# Patient Record
Sex: Male | Born: 1955 | Race: Black or African American | Hispanic: No | Smoking: Current every day smoker
Health system: Southern US, Community
[De-identification: ages and names within clinical notes are randomized; demographics above are authoritative.]

## PROBLEM LIST (undated history)

## (undated) DIAGNOSIS — I441 Atrioventricular block, second degree: Secondary | ICD-10-CM

## (undated) DIAGNOSIS — R55 Syncope and collapse: Secondary | ICD-10-CM

## (undated) DIAGNOSIS — Z950 Presence of cardiac pacemaker: Secondary | ICD-10-CM

## (undated) DIAGNOSIS — F0280 Dementia in other diseases classified elsewhere without behavioral disturbance: Secondary | ICD-10-CM

## (undated) DIAGNOSIS — Z21 Asymptomatic human immunodeficiency virus [HIV] infection status: Secondary | ICD-10-CM

## (undated) DIAGNOSIS — L568 Other specified acute skin changes due to ultraviolet radiation: Secondary | ICD-10-CM

## (undated) DIAGNOSIS — A523 Neurosyphilis, unspecified: Secondary | ICD-10-CM

## (undated) DIAGNOSIS — B20 Human immunodeficiency virus [HIV] disease: Secondary | ICD-10-CM

## (undated) DIAGNOSIS — I10 Essential (primary) hypertension: Secondary | ICD-10-CM

## (undated) HISTORY — DX: Neurosyphilis, unspecified: A52.3

## (undated) HISTORY — DX: Other specified acute skin changes due to ultraviolet radiation: L56.8

## (undated) HISTORY — DX: Asymptomatic human immunodeficiency virus (hiv) infection status: Z21

## (undated) HISTORY — DX: Essential (primary) hypertension: I10

## (undated) HISTORY — PX: PACEMAKER INSERTION: SHX728

## (undated) HISTORY — DX: Presence of cardiac pacemaker: Z95.0

## (undated) HISTORY — DX: Syncope and collapse: R55

## (undated) HISTORY — DX: Atrioventricular block, second degree: I44.1

## (undated) HISTORY — DX: Human immunodeficiency virus (HIV) disease: B20

## (undated) HISTORY — DX: Dementia in other diseases classified elsewhere without behavioral disturbance: F02.80

## (undated) SURGERY — COLONOSCOPY WITH PROPOFOL
Anesthesia: Monitor Anesthesia Care | Laterality: Left

## (undated) MED FILL — DAPSONE 100MG TABS: 100 MG | 30 days supply | Qty: 30 | Fill #0 | Status: AC

---

## 2009-12-12 ENCOUNTER — Emergency Department (HOSPITAL_COMMUNITY): Admission: EM | Admit: 2009-12-12 | Discharge: 2009-12-13 | Payer: Self-pay | Admitting: Emergency Medicine

## 2009-12-24 ENCOUNTER — Ambulatory Visit (HOSPITAL_COMMUNITY): Admission: RE | Admit: 2009-12-24 | Discharge: 2009-12-24 | Payer: Self-pay | Admitting: Internal Medicine

## 2010-02-06 ENCOUNTER — Ambulatory Visit (HOSPITAL_COMMUNITY)
Admission: RE | Admit: 2010-02-06 | Discharge: 2010-02-06 | Payer: Self-pay | Source: Home / Self Care | Attending: Internal Medicine | Admitting: Internal Medicine

## 2010-02-06 ENCOUNTER — Encounter (INDEPENDENT_AMBULATORY_CARE_PROVIDER_SITE_OTHER): Payer: Self-pay | Admitting: Internal Medicine

## 2010-03-05 ENCOUNTER — Ambulatory Visit
Admission: RE | Admit: 2010-03-05 | Discharge: 2010-03-05 | Payer: Self-pay | Source: Home / Self Care | Attending: Family Medicine | Admitting: Family Medicine

## 2010-03-05 ENCOUNTER — Encounter: Payer: Self-pay | Admitting: Family Medicine

## 2010-03-05 DIAGNOSIS — F1021 Alcohol dependence, in remission: Secondary | ICD-10-CM | POA: Insufficient documentation

## 2010-03-05 DIAGNOSIS — J449 Chronic obstructive pulmonary disease, unspecified: Secondary | ICD-10-CM | POA: Insufficient documentation

## 2010-03-05 DIAGNOSIS — L80 Vitiligo: Secondary | ICD-10-CM | POA: Insufficient documentation

## 2010-03-05 DIAGNOSIS — J4489 Other specified chronic obstructive pulmonary disease: Secondary | ICD-10-CM | POA: Insufficient documentation

## 2010-03-05 DIAGNOSIS — F17200 Nicotine dependence, unspecified, uncomplicated: Secondary | ICD-10-CM | POA: Insufficient documentation

## 2010-03-05 DIAGNOSIS — I441 Atrioventricular block, second degree: Secondary | ICD-10-CM | POA: Insufficient documentation

## 2010-03-10 ENCOUNTER — Encounter
Admission: RE | Admit: 2010-03-10 | Discharge: 2010-03-10 | Payer: Self-pay | Source: Home / Self Care | Attending: Cardiovascular Disease | Admitting: Cardiovascular Disease

## 2010-03-11 ENCOUNTER — Encounter: Payer: Self-pay | Admitting: Family Medicine

## 2010-03-11 LAB — CONVERTED CEMR LAB
ALT: 15 units/L
AST: 20 units/L
Albumin: 5.6 g/dL
Alkaline Phosphatase: 62 units/L
BUN: 14 mg/dL
Bilirubin Urine: NEGATIVE
CO2: 27 meq/L
Calcium: 9.7 mg/dL
Chloride: 97 meq/L
Creatinine, Ser: 1.05 mg/dL
Glucose, Bld: 97 mg/dL
HCT: 41.1 %
Hemoglobin: 13.6 g/dL
Ketones, ur: NEGATIVE mg/dL
Leukocytes, UA: NEGATIVE
MCHC: 33.1 g/dL
MCV: 92.6 fL
Nitrite: NEGATIVE
Platelets: 224 10*3/uL
Potassium: 4.3 meq/L
Protein, ur: NEGATIVE mg/dL
RBC: 4.44 M/uL
RDW: 11.7 %
Sodium: 133 meq/L
Specific Gravity, Urine: 1.011
TSH: 1.97 microintl units/mL
Total Bilirubin: 0.5 mg/dL
Total Protein: 8.6 g/dL
Urine Glucose: NEGATIVE mg/dL
Urobilinogen, UA: 0.2
WBC: 5.7 10*3/uL
pH: 6

## 2010-03-14 ENCOUNTER — Ambulatory Visit (HOSPITAL_COMMUNITY)
Admission: RE | Admit: 2010-03-14 | Discharge: 2010-03-15 | Payer: Self-pay | Source: Home / Self Care | Attending: Cardiovascular Disease | Admitting: Cardiovascular Disease

## 2010-03-17 LAB — GLUCOSE, CAPILLARY
Glucose-Capillary: 89 mg/dL (ref 70–99)
Glucose-Capillary: 306 mg/dL — ABNORMAL HIGH (ref 70–99)

## 2010-03-17 LAB — SURGICAL PCR SCREEN
MRSA, PCR: NEGATIVE
Staphylococcus aureus: POSITIVE — AB

## 2010-03-27 NOTE — Op Note (Addendum)
Shaun Lutz, [place]                  ACCOUNT NO.:  [id number]  MEDICAL RECORD NO.:  [id number]          PATIENT TYPE:  OIB  LOCATION:  2010                         FACILITY:  [facility]  PHYSICIAN:  Thurmon Fair, MD     DATE OF BIRTH:  05/11/1955  DATE OF PROCEDURE: DATE OF DISCHARGE:                              OPERATIVE REPORT   PROCEDURE PERFORMED: 1. Moderate sedation. 2. Fluoroscopy. 3. Implantation of new dual-chamber permanent pacemaker.  PREOPERATIVE DIAGNOSES: 1. Recurrent syncope due to high-grade second-degree atrioventricular     block. 2. Atrial tachycardia with necessary medications worsening     bradycardia.  PROCEDURE PERFORMED BY:  Thurmon Fair, MD  ASSISTANT:  Leanord Asal.  COMPLICATIONS:  None.  ESTIMATED BLOOD LOSS:  Less than 10 mL.  MEDICATIONS:  Ancef 1 g intravenously, lidocaine 1% 25 mL locally, Versed 2 mg and fentanyl 25 mcg intravenously.  DEVICE DETAILS: 1. Pacemaker generator, Medtronic Adapta, model #ADDDRL1, serial     [id number]. 2. Atrial lead, Medtronic 5076, 52 cm, serial #[id number]. 3. Ventricular lead, Medtronic 5076, 58 cm, serial #[id number].  After risks and benefits of the procedure were described, the patient provided informed consent.  He was brought to the cardiac cath lab in the fasting state and prepped and draped in the usual sterile fashion. Local anesthesia with 1% lidocaine was administered to the left infraclavicular area.  Using a blade, a 5-6 cm horizontal incision was made parallel to the inferior border of the left clavicle.  Using electrocautery and blunt dissection, a prepectoral pocket was created and carefully inspected for hemostasis.  An antibiotic-soaked sponge was placed in the pocket.  Under fluoroscopic guidance and using two separate venipunctures by the Seldinger technique, two separate J-tipped guidewires were inserted in the left subclavian vein.  These were subsequently exchanged for 7- [place]  SafeSheath.  Again under fluoroscopic guidance, a ventricular lead was advanced to level of the right ventricular apical septum.  The active fixation helix was deployed.  Excellent current of injury was noted.  There was no evidence of diaphragmatic/phrenic nerve stimulation at maximum device output.  Satisfactory sensing and pacing parameters were encountered. The sheath was removed and the lead was secured in place using 2-0 silk.  In a similar fashion, the atrial lead was advanced to the level of the right atrial appendage, and the active fixation helix was deployed. Prominent current of injury was seen.  Pacing at maximum device output did not produce any diaphragmatic/phrenic nerve stimulation.  Atrial sensing and pacing parameters were satisfactory.  The sheath was removed, and the lead was secured in place using a 2-0 silk.  The pocket was then flushed with copious amounts of antibiotic solution and reinspected for hemostasis which was found to be excellent.  The device was connected to the atrial and ventricular leads with careful attention taken to connect the leads in the correct fashion. Appropriate atrial and ventricular sequential pacing was noted.  The device was inserted in the pocket with great care being taken that the lead be located deep to the generator and that the device in general assume a comfortable position  with no tension on the edges of the wound.  The surgical site was then closed with two layers of 2-0 Vicryl and a layer of cutaneous staples.  A sterile dressing was applied.  No immediate complications occurred.  The following electronic parameters were encountered: 1. Atrial lead, sensed P-wave 4.6 mV, impedance 1013 ohms, threshold     1.3 V at 0.5 msec pulse width, current 1.4 mA. 2. Ventricular lead, sensed R-wave 4.2 mV, impedance 981 ohms,     threshold 0.4 V at 0.5 msec pulse width, current 0.5 mA.     Thurmon Fair, MD     MC/MEDQ  D:   03/14/2010  T:  03/15/2010  Job:  [id number]  cc:   Italy Hilty, MD  Electronically Signed by Thurmon Fair M.D. on 03/27/2010 12:17:39 PM

## 2010-04-03 NOTE — Miscellaneous (Signed)
Summary: ROI  ROI   Imported By: De Nurse 03/12/2010 15:39:50  _____________________________________________________________________  External Attachment:    Type:   Image     Comment:   External Document

## 2010-04-03 NOTE — Assessment & Plan Note (Signed)
Summary: np/pt will pay $20 then see deb hill/eo   Vital Signs:  Patient profile:   55 year old male Height:      67.75 inches Weight:      124.50 pounds BMI:     19.14 BSA:     1.67 Temp:     97.7 degrees F Pulse rate:   71 / minute BP sitting:   133 / 80  Vitals Entered By: Jone Baseman CMA (March 05, 2010 1:38 PM) CC: new pt Is Patient Diabetic? No Pain Assessment Patient in pain? no        CC:  new pt.  History of Present Illness: Pt was fainting at work in Oct.  Seen in ED and in afib.  Work up included NM scan with no ischemic defects, ECHO- essentially nml and cardiac enzymes.  Has seen cardiology and had blood work done, and is scheduled for a pacemaker insertion on 1/9.    Habits & Providers  Alcohol-Tobacco-Diet     Tobacco Status: current     Tobacco Counseling: to quit use of tobacco products     Cigarette Packs/Day: <0.25  Current Medications (verified): 1)  Aspirin 81 Mg Chew (Aspirin) .Marland Kitchen.. 1 By Mouth Daily  Allergies (verified): No Known Drug Allergies  Past History:  Family History: Last updated: 03/05/2010 Family History Breast cancer 1st degree relative <50 Family History of CAD Male 1st degree relative <50 Family History Diabetes 1st degree relative  Social History: Last updated: 03/05/2010 Alcohol use-no  Risk Factors: Smoking Status: current (03/05/2010) Packs/Day: <0.25 (03/05/2010)  Past Surgical History: Denies surgical history  Family History: Family History Breast cancer 1st degree relative <50 Family History of CAD Male 1st degree relative <50 Family History Diabetes 1st degree relative  Social History: Alcohol use-no Smoking Status:  current Packs/Day:  <0.25  Review of Systems       The patient complains of headaches.  The patient denies anorexia, chest pain, syncope, peripheral edema, prolonged cough, abdominal pain, hematochezia, and severe indigestion/heartburn.    Physical Exam  General:  alert,  well-developed, and well-nourished.   Head:  normocephalic and atraumatic.   Neck:  supple.   Lungs:  normal respiratory effort, no intercostal retractions, no accessory muscle use, and normal breath sounds.   Heart:  normal rate and irregular rhythm.   Abdomen:  soft and non-tender.   Extremities:  No clubbing, cyanosis, edema, or deformity noted with normal full range of motion of all joints.   Skin:  vitiligo.     Impression & Recommendations:  Problem # 1:  SICK SINUS SYNDROME (ICD-427.81)  His updated medication list for this problem includes:    Aspirin 81 Mg Chew (Aspirin) .Marland Kitchen... 1 by mouth daily  Problem # 2:  COPD (ICD-496) Found by CXR  Problem # 3:  TOBACCO ABUSE (ICD-305.1) Encouraged to quit.  Problem # 4:  VITILIGO (ICD-709.01)  Complete Medication List: 1)  Aspirin 81 Mg Chew (Aspirin) .Marland Kitchen.. 1 by mouth daily  Patient Instructions: 1)  Please schedule a follow-up appointment in 6 weeks.  Prescriptions: ASPIRIN 81 MG CHEW (ASPIRIN) 1 by mouth daily  #180 x 0   Entered and Authorized by:   Tinnie Gens MD   Signed by:   Tinnie Gens MD on 03/05/2010   Method used:   Historical   RxID:   [id number]    Orders Added: 1)  [facility] 4 [14782]

## 2010-04-07 ENCOUNTER — Other Ambulatory Visit: Payer: Self-pay | Admitting: Family Medicine

## 2010-04-07 ENCOUNTER — Ambulatory Visit (INDEPENDENT_AMBULATORY_CARE_PROVIDER_SITE_OTHER): Payer: Self-pay | Admitting: Family Medicine

## 2010-04-07 ENCOUNTER — Encounter: Payer: Self-pay | Admitting: Family Medicine

## 2010-04-07 DIAGNOSIS — R55 Syncope and collapse: Secondary | ICD-10-CM

## 2010-04-07 DIAGNOSIS — F17200 Nicotine dependence, unspecified, uncomplicated: Secondary | ICD-10-CM

## 2010-04-07 DIAGNOSIS — L80 Vitiligo: Secondary | ICD-10-CM

## 2010-04-07 DIAGNOSIS — J449 Chronic obstructive pulmonary disease, unspecified: Secondary | ICD-10-CM

## 2010-04-07 HISTORY — DX: Syncope and collapse: R55

## 2010-04-07 LAB — PSA: PSA: 0.87 ng/mL (ref <=4.00)

## 2010-04-07 LAB — CONVERTED CEMR LAB: PSA: 0.87 ng/mL (ref <=4.00)

## 2010-04-08 ENCOUNTER — Encounter: Payer: Self-pay | Admitting: Family Medicine

## 2010-04-17 NOTE — Miscellaneous (Signed)
Summary: ROI  ROI   Imported By: De Nurse 04/08/2010 11:30:27  _____________________________________________________________________  External Attachment:    Type:   Image     Comment:   External Document

## 2010-04-17 NOTE — Letter (Signed)
Summary: Results Follow-up Letter  All     ,     Phone:   Fax:     04/08/2010  Shaun Lutz [address] [place], [place]  [address]  [place]  Dear [name],   The following are the results of your recent test(s):  Your PSA, prostate test is completely normal!    Sincerely,   Tinnie Gens MD            Appended Document: Results Follow-up Letter mailed

## 2010-04-17 NOTE — Assessment & Plan Note (Signed)
Summary: f/u,df   Vital Signs:  Patient profile:   55 year old male Height:      67.75 inches Weight:      126 pounds BMI:     19.37 BSA:     1.68 Temp:     98.3 degrees F Pulse rate:   80 / minute BP sitting:   138 / 104  (left arm)  Vitals Entered By: Jone Baseman CMA (April 07, 2010 1:27 PM) CC: f/u Is Patient Diabetic? No Pain Assessment Patient in pain? no        CC:  f/u.  History of Present Illness: Pt. returns today.  He is s/p pacemaker insertion.  He is without complaint.  He is not back working.  He notes that he had some blood work done at Cards office---will try to get records-->Have records, does not have a. 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  Has pauses of up to 3 seconds.  Also, has runs of unsustained V tach.  Has not had lipid panel.  Needs some general medical things.  Habits & Providers  Alcohol-Tobacco-Diet     Tobacco Status: current     Tobacco Counseling: to quit use of tobacco products     Cigarette Packs/Day: <0.25  Current Medications (verified): 1)  Aspirin 81 Mg Chew (Aspirin) .Marland Kitchen.. 1 By Mouth Daily 2)  Atenolol 25 Mg Tab (Atenolol) .... Take One (1) Tablet By Mouth Daily  Allergies (verified): No Known Drug Allergies  Past History:  Past Surgical History: Last updated: 03/05/2010 Denies surgical history  Family History: Last updated: 03/05/2010 Family History Breast cancer 1st degree relative <50 Family History of CAD Male 1st degree relative <50 Family History Diabetes 1st degree relative  Social History: Last updated: 03/05/2010 Alcohol use-no  Risk Factors: Smoking Status: current (04/07/2010) Packs/Day: <0.25 (04/07/2010)  Review of Systems  The patient denies anorexia, fever, weight gain, decreased hearing, chest pain, syncope, dyspnea on exertion, peripheral edema, headaches, hemoptysis, and abdominal pain.    Physical Exam  General:  alert, well-developed, and well-nourished.   Head:   normocephalic and atraumatic.   Neck:  supple.   Lungs:  normal respiratory effort and normal breath sounds.   Heart:  normal rate and irregular rhythm.   Abdomen:  soft, non-tender, and normal bowel sounds.     Impression & Recommendations:  Problem # 1:  SYNCOPE (ICD-780.2)  Problem # 2:  ATRIOVENTRICULAR BLOCK, MOBITZ TYPE II (ICD-426.12)  Problem # 3:  TOBACCO ABUSE (ICD-305.1)  Orders: [facility]- Est Level  3 (04540)  Problem # 4:  VITILIGO (ICD-709.01)  Orders: [facility]- Est Level  3 (98119)  Complete Medication List: 1)  Aspirin 81 Mg Chew (Aspirin) .Marland Kitchen.. 1 by mouth daily 2)  Atenolol 25 Mg Tab (Atenolol) .... Take one (1) tablet by mouth daily  Other Orders: T-PSA Total ([address]) Flu Vaccine 78yrs + (13086) Admin 1st Vaccine (57846) Tdap => 82yrs IM (96295) Admin of Any Addtl Vaccine (28413)  Patient Instructions: 1)  Please schedule a follow-up appointment in 3 months .  Prescriptions: ATENOLOL 25 MG TAB (ATENOLOL) Take one (1) tablet by mouth daily  #30 x 3   Entered and Authorized by:   Tinnie Gens MD   Signed by:   Tinnie Gens MD on 04/07/2010   Method used:   Historical   RxID:   [id number]    Prevention & Chronic Care Immunizations   Influenza vaccine: Fluvax 3+  (04/07/2010)   Influenza vaccine due:  11/01/2011    Tetanus booster: 04/07/2010: Tdap    Pneumococcal vaccine: Not documented  Colorectal Screening   Hemoccult: Not documented    Colonoscopy: Not documented  Other Screening   PSA: Not documented   PSA ordered.   PSA action/deferral: Discussed-PSA requested  (04/07/2010)   Smoking status: current  (04/07/2010)  Lipids   Total Cholesterol: Not documented   LDL: Not documented   LDL Direct: Not documented   HDL: Not documented   Triglycerides: Not documented   Nursing Instructions: Give tetanus booster today    Orders Added: 1)  T-PSA Total [[address]] 2)  [facility]- Est Level  3 [29528] 3)  Flu Vaccine 68yrs + [90658] 4)   Admin 1st Vaccine [90471] 5)  Tdap => 42yrs IM [90715] 6)  Admin of Any Addtl Vaccine [41324]   Immunizations Administered:  Influenza Vaccine # 1:    Vaccine Type: Fluvax 3+    Site: left deltoid    Mfr: GlaxoSmithKline    Dose: 0.5 ml    Route: IM    Given by: Jone Baseman CMA    Exp. Date: 12/23/2011    Lot #: [id number]    VIS given: 09/24/09 version given April 07, 2010.  Tetanus Vaccine:    Vaccine Type: Tdap    Site: left deltoid    Mfr: GlaxoSmithKline    Dose: 0.5 ml    Route: IM    Given by: Jone Baseman CMA    Exp. Date: 12/20/2011    Lot #: [id number]    VIS given: 01/18/08 version given April 07, 2010.  Flu Vaccine Consent Questions:    Do you have a history of severe allergic reactions to this vaccine? no    Any prior history of allergic reactions to egg and/or gelatin? no    Do you have a sensitivity to the preservative Thimersol? no    Do you have a past history of Guillan-Barre Syndrome? no    Do you currently have an acute febrile illness? no    Have you ever had a severe reaction to latex? no    Vaccine information given and explained to patient? yes   Immunizations Administered:  Influenza Vaccine # 1:    Vaccine Type: Fluvax 3+    Site: left deltoid    Mfr: GlaxoSmithKline    Dose: 0.5 ml    Route: IM    Given by: Jone Baseman CMA    Exp. Date: 12/23/2011    Lot #: [id number]    VIS given: 09/24/09 version given April 07, 2010.  Tetanus Vaccine:    Vaccine Type: Tdap    Site: left deltoid    Mfr: GlaxoSmithKline    Dose: 0.5 ml    Route: IM    Given by: Jone Baseman CMA    Exp. Date: 12/20/2011    Lot #: [id number]    VIS given: 01/18/08 version given April 07, 2010.  ERROR: put in Flu immunization but actually gave Tdap. ............................................... Shanda Bumps [facility] April 07, 2010 3:41 PM

## 2010-04-29 ENCOUNTER — Emergency Department (HOSPITAL_COMMUNITY): Payer: Self-pay

## 2010-04-29 ENCOUNTER — Observation Stay (HOSPITAL_COMMUNITY)
Admission: EM | Admit: 2010-04-29 | Discharge: 2010-05-01 | Disposition: A | Discharge status: Home / Self Care | Payer: Self-pay | Attending: Cardiovascular Disease | Admitting: Cardiovascular Disease

## 2010-04-29 LAB — POCT I-STAT, CHEM 8
BUN: 23 mg/dL (ref 6–23)
Calcium, Ion: 1.15 mmol/L (ref 1.12–1.32)
Chloride: 99 mEq/L (ref 96–112)
Creatinine, Ser: 1.3 mg/dL (ref 0.4–1.5)
Glucose, Bld: 194 mg/dL — ABNORMAL HIGH (ref 70–99)
HCT: 44 % (ref 39.0–52.0)
Hemoglobin: 15 g/dL (ref 13.0–17.0)
Potassium: 4.6 mEq/L (ref 3.5–5.1)
Sodium: 136 mEq/L (ref 135–145)
TCO2: 26 mmol/L (ref 0–100)

## 2010-04-29 LAB — CBC
HCT: 39.7 % (ref 39.0–52.0)
Hemoglobin: 14 g/dL (ref 13.0–17.0)
MCH: 31.6 pg (ref 26.0–34.0)
MCHC: 35.3 g/dL (ref 30.0–36.0)
MCV: 89.6 fL (ref 78.0–100.0)
Platelets: 193 10*3/uL (ref 150–400)
RBC: 4.43 MIL/uL (ref 4.22–5.81)
RDW: 11.3 % — ABNORMAL LOW (ref 11.5–15.5)
WBC: 6 10*3/uL (ref 4.0–10.5)

## 2010-04-29 LAB — POCT CARDIAC MARKERS
CKMB, poc: 1.1 ng/mL (ref 1.0–8.0)
Myoglobin, poc: 107 ng/mL (ref 12–200)
Troponin i, poc: <0.05 ng/mL (ref 0.00–0.09)

## 2010-04-29 LAB — DIFFERENTIAL
Basophils Absolute: 0 10*3/uL (ref 0.0–0.1)
Basophils Relative: 1 % (ref 0–1)
Eosinophils Absolute: 0.7 10*3/uL (ref 0.0–0.7)
Eosinophils Relative: 12 % — ABNORMAL HIGH (ref 0–5)
Lymphocytes Relative: 27 % (ref 12–46)
Lymphs Abs: 1.6 10*3/uL (ref 0.7–4.0)
Monocytes Absolute: 0.4 10*3/uL (ref 0.1–1.0)
Monocytes Relative: 7 % (ref 3–12)
Neutro Abs: 3.2 10*3/uL (ref 1.7–7.7)
Neutrophils Relative %: 53 % (ref 43–77)

## 2010-04-30 LAB — BASIC METABOLIC PANEL
BUN: 18 mg/dL (ref 6–23)
CO2: 28 mEq/L (ref 19–32)
Calcium: 8.9 mg/dL (ref 8.4–10.5)
Chloride: 101 mEq/L (ref 96–112)
Creatinine, Ser: 1.04 mg/dL (ref 0.4–1.5)
GFR calc Af Amer: >60 mL/min (ref >60)
GFR calc non Af Amer: >60 mL/min (ref >60)
Glucose, Bld: 103 mg/dL — ABNORMAL HIGH (ref 70–99)
Potassium: 4 mEq/L (ref 3.5–5.1)
Sodium: 135 mEq/L (ref 135–145)

## 2010-04-30 LAB — HEMOGLOBIN A1C
Hgb A1c MFr Bld: 6.2 % — ABNORMAL HIGH (ref <5.7)
Mean Plasma Glucose: 131 mg/dL — ABNORMAL HIGH (ref <117)

## 2010-05-01 LAB — BASIC METABOLIC PANEL
BUN: 12 mg/dL (ref 6–23)
CO2: 28 mEq/L (ref 19–32)
Calcium: 8.5 mg/dL (ref 8.4–10.5)
Chloride: 103 mEq/L (ref 96–112)
Creatinine, Ser: 0.86 mg/dL (ref 0.4–1.5)
GFR calc Af Amer: >60 mL/min (ref >60)
GFR calc non Af Amer: >60 mL/min (ref >60)
Glucose, Bld: 106 mg/dL — ABNORMAL HIGH (ref 70–99)
Potassium: 3.8 mEq/L (ref 3.5–5.1)
Sodium: 135 mEq/L (ref 135–145)

## 2010-05-01 LAB — CBC
HCT: 34.4 % — ABNORMAL LOW (ref 39.0–52.0)
Hemoglobin: 11.7 g/dL — ABNORMAL LOW (ref 13.0–17.0)
MCH: 30.4 pg (ref 26.0–34.0)
MCHC: 34 g/dL (ref 30.0–36.0)
MCV: 89.4 fL (ref 78.0–100.0)
Platelets: 187 10*3/uL (ref 150–400)
RBC: 3.85 MIL/uL — ABNORMAL LOW (ref 4.22–5.81)
RDW: 11.1 % — ABNORMAL LOW (ref 11.5–15.5)
WBC: 6.2 10*3/uL (ref 4.0–10.5)

## 2010-05-15 LAB — COMPREHENSIVE METABOLIC PANEL
ALT: 12 U/L (ref 0–53)
AST: 17 U/L (ref 0–37)
Albumin: 3.7 g/dL (ref 3.5–5.2)
Alkaline Phosphatase: 56 U/L (ref 39–117)
BUN: 14 mg/dL (ref 6–23)
CO2: 26 mEq/L (ref 19–32)
Calcium: 9 mg/dL (ref 8.4–10.5)
Chloride: 102 mEq/L (ref 96–112)
Creatinine, Ser: 1.06 mg/dL (ref 0.4–1.5)
GFR calc Af Amer: >60 mL/min (ref >60)
GFR calc non Af Amer: >60 mL/min (ref >60)
Glucose, Bld: 115 mg/dL — ABNORMAL HIGH (ref 70–99)
Potassium: 4.5 mEq/L (ref 3.5–5.1)
Sodium: 135 mEq/L (ref 135–145)
Total Bilirubin: 0.4 mg/dL (ref 0.3–1.2)
Total Protein: 7.5 g/dL (ref 6.0–8.3)

## 2010-05-15 LAB — POCT CARDIAC MARKERS
CKMB, poc: <1 ng/mL — ABNORMAL LOW (ref 1.0–8.0)
Troponin i, poc: <0.05 ng/mL (ref 0.00–0.09)
Troponin i, poc: <0.05 ng/mL (ref 0.00–0.09)

## 2010-05-15 LAB — URINALYSIS, ROUTINE W REFLEX MICROSCOPIC
Bilirubin Urine: NEGATIVE
Glucose, UA: NEGATIVE mg/dL
Hgb urine dipstick: NEGATIVE
Ketones, ur: NEGATIVE mg/dL
Leukocytes, UA: NEGATIVE
Nitrite: POSITIVE — AB
Protein, ur: NEGATIVE mg/dL
Specific Gravity, Urine: 1.024 (ref 1.005–1.030)
Urobilinogen, UA: 1 mg/dL (ref 0.0–1.0)
pH: 6 (ref 5.0–8.0)

## 2010-05-15 LAB — URINE MICROSCOPIC-ADD ON

## 2010-05-15 LAB — CBC
HCT: 37.7 % — ABNORMAL LOW (ref 39.0–52.0)
Hemoglobin: 13 g/dL (ref 13.0–17.0)
MCH: 31 pg (ref 26.0–34.0)
MCHC: 34.5 g/dL (ref 30.0–36.0)
MCV: 89.8 fL (ref 78.0–100.0)
Platelets: 202 10*3/uL (ref 150–400)
RBC: 4.2 MIL/uL — ABNORMAL LOW (ref 4.22–5.81)
RDW: 11.4 % — ABNORMAL LOW (ref 11.5–15.5)
WBC: 7 10*3/uL (ref 4.0–10.5)

## 2010-05-15 LAB — URINE CULTURE
Colony Count: 50000
Culture  Setup Time: 201110140413

## 2010-05-15 LAB — DIFFERENTIAL
Basophils Absolute: 0 10*3/uL (ref 0.0–0.1)
Eosinophils Absolute: 0.2 10*3/uL (ref 0.0–0.7)
Eosinophils Relative: 3 % (ref 0–5)
Lymphs Abs: 0.8 10*3/uL (ref 0.7–4.0)
Monocytes Absolute: 0.5 10*3/uL (ref 0.1–1.0)

## 2010-05-22 NOTE — Discharge Summary (Signed)
  NAMEDERIN, GRANQUIST NO.:  [id number]  MEDICAL RECORD NO.:  [id number]           PATIENT TYPE:  I  LOCATION:  2037                         FACILITY:  [facility]  PHYSICIAN:  Nicki Guadalajara, M.D.     DATE OF BIRTH:  12-Nov-1955  DATE OF ADMISSION:  04/29/2010 DATE OF DISCHARGE:  05/01/2010                              DISCHARGE SUMMARY   DISCHARGE DIAGNOSES: 1. Syncope. 2. Ejection fraction 55-60%, normal wall motion, echocardiogram     February 06, 2010. 3. Tobacco use. 4. Vitiligo. 5. Status post pacemaker, January 2012.  HOSPITAL COURSE:  Mr. 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  He also has a history of tobacco use, vitiligo, normal ejection fraction on echo on February 06, 2010.  He presented with syncopal episode while at work at [employer] on April 29, 2010.  The patient stated he did not have any prodromal symptoms, he just kind of passed out. Current medications were atenolol 50 mg once daily and hydrochlorothiazide 12.5 mg daily.  He was admitted to check orthostatic with vitals.  His hydrochlorothiazide was discontinued, and his atenolol was changed to 25 mg b.i.d.  He was admitted for observation as well as hydration.  He has had no further episodes.  He has had no dizziness, no syncope and is ambulating fine.  Orthostatic blood pressures were normal.  He has been seen by Dr. Tresa Endo who feels he is stable for discharge with followup with Dr. Royann Shivers.  DISCHARGE LABORATORY DATA:  WBC 6.2, hemoglobin 11.7, hematocrit 34.4, platelets 187.  Sodium 135, potassium 3.8, chloride 103, carbon dioxide 28, glucose 106, BUN 12, calcium 8.5, hemoglobin A1c 6.2, mean plasma glucose 131.  CK-MB and troponins were negative.  STUDIES/PROCEDURES:  Chest x-ray stable bullous changes and scarring at the left lung base, no acute  cardiopulmonary disease.  DISCHARGE MEDICATIONS: 1. Atenolol 25 mg p.o. b.i.d. 2. Aspirin enteric coated 81 mg.  DISPOSITION:  Mr. Rajewski will be discharged home in stable condition.  He has had no further episodes of syncope or dizziness.  He is recommended to heart-healthy diet.  Follow up with Dr. Royann Shivers on Thursday, May 15, 2010, at 10 o'clock in the morning for further evaluation.    ______________________________ Wilburt Finlay, PA   ______________________________ Nicki Guadalajara, M.D.    BH/MEDQ  D:  05/01/2010  T:  05/02/2010  Job:  [id number]  cc:   Thurmon Fair, MD  Electronically Signed by Wilburt Finlay PA on 05/12/2010 05:02:54 PM Electronically Signed by Nicki Guadalajara M.D. on 05/22/2010 03:31:48 PM

## 2010-05-27 ENCOUNTER — Encounter: Payer: Self-pay | Admitting: Family Medicine

## 2010-05-27 ENCOUNTER — Ambulatory Visit (INDEPENDENT_AMBULATORY_CARE_PROVIDER_SITE_OTHER): Payer: Self-pay | Admitting: Family Medicine

## 2010-05-27 DIAGNOSIS — H538 Other visual disturbances: Secondary | ICD-10-CM

## 2010-05-27 DIAGNOSIS — I441 Atrioventricular block, second degree: Secondary | ICD-10-CM

## 2010-05-27 DIAGNOSIS — L80 Vitiligo: Secondary | ICD-10-CM

## 2010-05-27 DIAGNOSIS — I10 Essential (primary) hypertension: Secondary | ICD-10-CM | POA: Insufficient documentation

## 2010-05-27 MED ORDER — ATENOLOL 25 MG PO TABS: 50.0000 mg | ORAL_TABLET | Freq: Two times a day (BID) | ORAL | Status: DC

## 2010-05-27 MED ORDER — HYDROCORTISONE 1 % EX CREA: TOPICAL_CREAM | CUTANEOUS | Status: AC

## 2010-05-27 NOTE — Assessment & Plan Note (Signed)
Cream for hands

## 2010-05-27 NOTE — Assessment & Plan Note (Signed)
Will increase atenolol to 50 mg q am

## 2010-05-27 NOTE — Assessment & Plan Note (Signed)
Increase atenolol as pulse can handle.

## 2010-05-27 NOTE — Patient Instructions (Signed)
Second Degree Atrioventricular Block (2nd Degree Heart Block) Second degree atrioventricular block is a type of heart block. The heartbeat is a coordinated contraction between the upper and lower chambers of the heart. This coordinated contraction happens because of an electrical impulse that is sent from the upper chambers of the heart to the lower chambers of the heart. The electrical impulse causes the heart to beat and pump blood. Normally, this electrical impulse is transmitted without delay. In a second degree heart block, an interruption occurs in the heart's electrical impulse between the upper and lower chambers of the heart. When this happens, the heart does not beat in a timely manner and affects the amount of blood pumped by the heart.   There are two types of 2nd degree heart block:   Mobitz Type 1. In this type of 2nd degree heart block, the electrical impulse is gradually delayed more and more until the heart misses a beat. This type of 2nd degree heart block is less serious than Mobitz type 2.   Mobitz Type 2. This type of 2nd degree heart block is more serious and can become a more severe form of heart block. With Mobitz type 2, some of the electrical signals are blocked and do not reach the lower chambers of the heart. This can occur suddenly and without warning. Some people may need a permanent pacemaker with this type of heart block.  CAUSES The cause of 2nd degree heart block may be a result of:  Age. The heart's electrical system can degenerate due to the aging process.   Heart attack. A heart attack can cause scarring which can damage the heart's electrical system.   Open heart surgery can damage and scar areas of the heart which affect the heart' s electrical system.   Heart medication such as beta and calcium channel blockers. These kinds of medications can affect the electrical impulse of the heart and can slow the heart rate if the dosage is too high.  SYMPTOMS  Mobitz  type 1 - Generally no symptoms are noticed but may have symptoms listed under Mobitz type 2.   Mobitz type 2 - Compared to Mobitz type 1, greater likelihood of the following symptoms:   Fatigue.  Shortness of breath.   Dizziness or lightheadedness.   Fainting.  Chest pain.   DIAGNOSIS  Electrocardiogram (EKG). An EKG is a tracing of the heartbeat and can show a 2nd degree heart block.   Holter monitor. A holter monitor is a continuous heart rhythm recording for 24 hours. This can be helpful in determining the kind of heart block you have and how it can be treated.   Electrophysiology (EP) study. This is a procedure which tests the electrical pathway of your heart. This type of test is done by a specialist who places catheters (long thin tubes) in your heart. The catheters are used to study your heart and record your heart's electrical signals.  TREATMENT  Mobitz Type 1. Generally, no treatment is needed.   Mobitz Type 2. A permanent pacemaker may be needed.   Heart medications such as beta blockers or calcium channel blockers can slow the heart rate. Your caregiver may need to adjust your heart medication if this is the cause of your heart block. Adjusting your heart medication my reverse the heart block.  SEEK MEDICAL CARE IF:  You have unexplained fatigue.   You feel lightheaded.   You feel faint.   You feel your heart skipping beats or your  heart beats very fast.  SEEK IMMEDIATE MEDICAL CARE IF:  You have severe chest pain, especially if the pain is crushing or pressure-like and spreads to the arms, back, neck, or jaw. THIS IS AN EMERGENCY. Do not wait to see if the pain will go away. Get medical help at once. Call your local emergency services (911 in the [place] DO NOT drive yourself to the hospital.   You notice increasing shortness of breath during rest, sleeping, or with activity.   You "black out" or faint.  MAKE SURE YOU:   Understand these instructions.   Will  watch your condition.   Will get help right away if you are not doing well or get worse.  Document Released: 01/30/2008 Document Re-Released: 05/15/2008 [facility] Patient Information 2011 [place], [place].Hypertension (High Blood Pressure) As your heart beats, it forces blood through your arteries. This force is your blood pressure. If the pressure is too high, it is called hypertension (HTN) or high blood pressure. HTN is dangerous because you may have it and not know it. High blood pressure may mean that your heart has to work harder to pump blood. Your arteries may be narrow or stiff. The extra work puts you at risk for heart disease, stroke, and other problems.  Blood pressure consists of two numbers, a higher number over a lower, 110/72, for example. It is stated as "110 over 72." The ideal is below 120 for the top number (systolic) and under 80 for the bottom (diastolic). Write down your blood pressure today. You should pay close attention to your blood pressure if you have certain conditions such as:  Heart failure.  Prior heart attack.   Diabetes   Chronic kidney disease.   Prior stroke.   Multiple risk factors for heart disease.   To see if you have HTN, your blood pressure should be measured while you are seated with your arm held at the level of the heart. It should be measured at least twice. A one-time elevated blood pressure reading (especially in the Emergency Department) does not mean that you need treatment. There may be conditions in which the blood pressure is different between your right and left arms. It is important to see your caregiver soon for a recheck. Most people have essential hypertension which means that there is not a specific cause. This type of high blood pressure may be lowered by changing lifestyle factors such as:  Stress.  Smoking.   Lack of exercise.   Excessive weight.  Drug/tobacco/alcohol use.   Eating less salt.   Most people do not have  symptoms from high blood pressure until it has caused damage to the body. Effective treatment can often prevent, delay or reduce that damage. TREATMENT Treatment for high blood pressure, when a cause has been identified, is directed at the cause. There are a large number of medications to treat HTN. These fall into several categories, and your caregiver will help you select the medicines that are best for you. Medications may have side effects. You should review side effects with your caregiver. If your blood pressure stays high after you have made lifestyle changes or started on medicines,   Your medication(s) may need to be changed.   Other problems may need to be addressed.   Be certain you understand your prescriptions, and know how and when to take your medicine.   Be sure to follow up with your caregiver within the time frame advised (usually within two weeks) to have your  blood pressure rechecked and to review your medications.   If you are taking more than one medicine to lower your blood pressure, make sure you know how and at what times they should be taken. Taking two medicines at the same time can result in blood pressure that is too low.  SEEK IMMEDIATE MEDICAL CARE IF YOU DEVELOP:  A severe headache, blurred or changing vision, or confusion.   Unusual weakness or numbness, or a faint feeling.   Severe chest or abdominal pain, vomiting, or breathing problems.  MAKE SURE YOU:   Understand these instructions.   Will watch your condition.   Will get help right away if you are not doing well or get worse.  Document Released: 02/16/2005 Document Re-Released: 08/06/2009 [facility] Patient Information 2011 [place], [place].

## 2010-05-27 NOTE — Progress Notes (Signed)
  Subjective:    Patient ID: Shaun Lutz, male    DOB: December 20, 1955, 55 y.o.   MRN: [id number]  HPI Comments: Also needs to see eye doctor.  Has not had new rx since 4-5 years.  Eye glass wearer since age 41. Also complains of itching on hands.  Hypertension This is a recurrent problem. The current episode started more than 1 year ago. The problem has been gradually worsening since onset. Associated symptoms include anxiety and blurred vision. Pertinent negatives include no chest pain, peripheral edema or shortness of breath. There are no associated agents to hypertension. Risk factors for coronary artery disease include male gender and smoking/tobacco exposure.      Review of Systems  Constitutional: Negative for fever and activity change.  HENT: Negative for congestion and rhinorrhea.   Eyes: Positive for blurred vision.  Respiratory: Negative for cough and shortness of breath.   Cardiovascular: Negative for chest pain.       Objective:   Physical Exam  Constitutional: He appears well-developed and well-nourished.  HENT:  Head: Normocephalic and atraumatic.  Cardiovascular: Normal rate.   Pulmonary/Chest: Effort normal and breath sounds normal.  Abdominal: Soft.  Skin:       Vitiligo on hands          Assessment & Plan:

## 2010-06-26 ENCOUNTER — Encounter: Payer: Self-pay | Admitting: Family Medicine

## 2010-06-26 ENCOUNTER — Ambulatory Visit (INDEPENDENT_AMBULATORY_CARE_PROVIDER_SITE_OTHER): Payer: Self-pay | Admitting: Family Medicine

## 2010-06-26 VITALS — BP 120/80 | HR 82 | Temp 98.1°F | Wt 121.2 lb

## 2010-06-26 DIAGNOSIS — I10 Essential (primary) hypertension: Secondary | ICD-10-CM

## 2010-06-26 NOTE — Assessment & Plan Note (Signed)
BP is markedly improved.   Continue current regimine.

## 2010-06-26 NOTE — Progress Notes (Signed)
  Subjective:    Patient ID: Shaun Lutz, male    DOB: 01/18/1956, 55 y.o.   MRN: [id number]  Hypertension This is a chronic problem. The current episode started more than 1 month ago. The problem has been gradually improving since onset. The problem is controlled. Pertinent negatives include no anxiety, blurred vision, chest pain, headaches or malaise/fatigue. There are no associated agents to hypertension. Risk factors for coronary artery disease include male gender and smoking/tobacco exposure. Past treatments include beta blockers. The current treatment provides significant improvement. There are no compliance problems.       Review of Systems  Constitutional: Negative for malaise/fatigue, activity change, appetite change and fatigue.  Eyes: Negative for blurred vision.  Respiratory: Negative for apnea.   Cardiovascular: Negative for chest pain.  Genitourinary: Negative for frequency.  Neurological: Negative for headaches.       Objective:   Physical Exam  Constitutional: He appears well-developed and well-nourished.  HENT:  Head: Normocephalic and atraumatic.  Eyes: Pupils are equal, round, and reactive to light.  Neck: Normal range of motion.  Pulmonary/Chest: Effort normal and breath sounds normal.  Abdominal: Soft.          Assessment & Plan:

## 2012-04-09 ENCOUNTER — Emergency Department (HOSPITAL_COMMUNITY)
Admission: EM | Admit: 2012-04-09 | Discharge: 2012-04-09 | Disposition: A | Discharge status: Home / Self Care | Payer: Medicaid Other | Attending: Emergency Medicine | Admitting: Emergency Medicine

## 2012-04-09 ENCOUNTER — Other Ambulatory Visit: Payer: Self-pay

## 2012-04-09 DIAGNOSIS — Z8679 Personal history of other diseases of the circulatory system: Secondary | ICD-10-CM | POA: Insufficient documentation

## 2012-04-09 DIAGNOSIS — Z79899 Other long term (current) drug therapy: Secondary | ICD-10-CM | POA: Insufficient documentation

## 2012-04-09 DIAGNOSIS — I10 Essential (primary) hypertension: Secondary | ICD-10-CM | POA: Insufficient documentation

## 2012-04-09 DIAGNOSIS — F17200 Nicotine dependence, unspecified, uncomplicated: Secondary | ICD-10-CM | POA: Insufficient documentation

## 2012-04-09 DIAGNOSIS — Z950 Presence of cardiac pacemaker: Secondary | ICD-10-CM | POA: Insufficient documentation

## 2012-04-09 DIAGNOSIS — R55 Syncope and collapse: Secondary | ICD-10-CM

## 2012-04-09 DIAGNOSIS — Z7982 Long term (current) use of aspirin: Secondary | ICD-10-CM | POA: Insufficient documentation

## 2012-04-09 LAB — BASIC METABOLIC PANEL
CO2: 29 mEq/L (ref 19–32)
Chloride: 99 mEq/L (ref 96–112)
GFR calc Af Amer: >90 mL/min (ref >90)
Potassium: 5 mEq/L (ref 3.5–5.1)
Sodium: 133 mEq/L — ABNORMAL LOW (ref 135–145)

## 2012-04-09 LAB — CBC WITH DIFFERENTIAL/PLATELET
Basophils Absolute: 0.1 10*3/uL (ref 0.0–0.1)
Basophils Relative: 1 % (ref 0–1)
HCT: 37.2 % — ABNORMAL LOW (ref 39.0–52.0)
Lymphocytes Relative: 18 % (ref 12–46)
MCHC: 34.7 g/dL (ref 30.0–36.0)
Monocytes Absolute: 0.3 10*3/uL (ref 0.1–1.0)
Neutro Abs: 4.3 10*3/uL (ref 1.7–7.7)
Neutrophils Relative %: 67 % (ref 43–77)
RDW: 11.7 % (ref 11.5–15.5)
WBC: 6.4 10*3/uL (ref 4.0–10.5)

## 2012-04-09 LAB — POCT I-STAT TROPONIN I: Troponin i, poc: 0 ng/mL (ref 0.00–0.08)

## 2012-04-09 NOTE — ED Provider Notes (Signed)
History     CSN: [id number]  Arrival date & time 04/09/12  1839   None     Chief Complaint  Patient presents with  . Loss of Consciousness    (Consider location/radiation/quality/duration/timing/severity/associated sxs/prior treatment) HPI History provided by pt.   Pt had a syncopal episode while standing at work today.  Does not recall anything about the event, other than waking on the floor with several coworkers standing around him.  Has had "thousands" of syncopal episodes in the past.  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  Pt denies recent illnesses including fever, cough, CP, SOB, palpitations, N/V/D and dysuria.  Has been eating and drinking normally.    Past Medical History  Diagnosis Date  . Hypertension     Past Surgical History  Procedure Laterality Date  . Pacemaker insertion      Family History  Problem Relation Age of Onset  . Cancer Mother   . Heart disease Father   . Heart disease Sister   . Heart disease Brother     History  Substance Use Topics  . Smoking status: Current Every Day Smoker -- 0.25 packs/day for 15 years    Types: Cigarettes  . Smokeless tobacco: Not on file  . Alcohol Use: No      Review of Systems  All other systems reviewed and are negative.    Allergies  Review of patient's allergies indicates no known allergies.  Home Medications   Current Outpatient Rx  Name  Route  Sig  Dispense  Refill  . aspirin 81 MG tablet   Oral   Take 81 mg by mouth daily.           Marland Kitchen atenolol (TENORMIN) 50 MG tablet   Oral   Take 50 mg by mouth 2 (two) times daily.           BP 129/93  Pulse 70  Temp(Src) 97.7 F (36.5 C) (Oral)  Resp 26  SpO2 100%  Physical Exam  Nursing note and vitals reviewed. Constitutional: He is oriented to person, place, and time. He appears well-developed and well-nourished. No distress.   HENT:  Head: Normocephalic and atraumatic.  Mouth/Throat: Oropharynx is clear and moist.  Eyes:  Normal appearance  Neck: Normal range of motion.  Cardiovascular: Normal rate, regular rhythm and intact distal pulses.   Pulmonary/Chest: Effort normal and breath sounds normal.  Musculoskeletal: Normal range of motion.  No LE edema/ttp  Neurological: He is alert and oriented to person, place, and time. No sensory deficit. Coordination normal.  CN 3-12 intact.  No nystagmus. 5/5 and equal upper and lower extremity strength.  No past pointing.     Skin: Skin is warm and dry. No rash noted.  Psychiatric: He has a normal mood and affect. His behavior is normal.    ED Course  Procedures (including critical care time)    Date: 04/09/2012  Rate: 91  Rhythm: paced  QRS Axis: normal  Intervals: normal  ST/T Wave abnormalities: normal  Conduction Disutrbances:none  Narrative Interpretation:  LVH  Old EKG Reviewed: unchanged   Labs Reviewed  CBC WITH DIFFERENTIAL - Abnormal; Notable for the following:    RBC 4.15 (*)    Hemoglobin 12.9 (*)    HCT 37.2 (*)    Eosinophils Relative 10 (*)    All other components within normal limits  BASIC METABOLIC PANEL - Abnormal; Notable for  the following:    Sodium 133 (*)    All other components within normal limits  POCT I-STAT TROPONIN I   No results found.   1. Syncope       MDM  57yo M w/ h/o recurrent syncope, for which he has a pacemaker, presents w/ syncopal episode w/out prodrome this afternoon at work.  On exam, afebrile, non-toxic, well-hydrated, heart w/ RRR, lungs clear, no focal neuro deficits.  EKG unchanged from prior.  Labs pending.  Nursing staff to interrogate pacemaker.  8:52 PM   Nursing staff spoke with technician at [employer], and patient had no cardiac events today.  Labs are unremarkable, with exception of U/A which patient was unable to provide.  He has not had any urinary sx and no prior h/o UTI.  His vital signs  are stable, he looks well and is ready for discharge.  Advised f/u with his cardiologist.  Return precautions discussed. 11:25 PM         Otilio Miu, PA-C 04/09/12 2325

## 2012-04-09 NOTE — ED Notes (Signed)
Per EMS, pt was at work when he passed out. It was witnessed by coworkers. Pt stated that this has happened several times before. He stated that he gets really sleepy and then he passes out. No CP or SOB before or after LOC. He completely alert and oriented currently. He stated that he feels good at this point. Will continue to monitor. CBG 107

## 2012-04-09 NOTE — ED Notes (Signed)
[employer] pacemaker interrogated, awaiting report

## 2012-04-09 NOTE — ED Provider Notes (Signed)
Medical screening examination/treatment/procedure(s) were performed by non-physician practitioner and as supervising physician I was immediately available for consultation/collaboration.   Gwyneth Sprout, MD 04/09/12 [phone]

## 2012-04-20 ENCOUNTER — Encounter: Payer: Self-pay | Admitting: Family Medicine

## 2012-04-20 ENCOUNTER — Ambulatory Visit (INDEPENDENT_AMBULATORY_CARE_PROVIDER_SITE_OTHER): Payer: Self-pay | Admitting: Family Medicine

## 2012-04-20 VITALS — BP 127/91 | HR 95 | Ht 68.0 in | Wt 116.0 lb

## 2012-04-20 DIAGNOSIS — I441 Atrioventricular block, second degree: Secondary | ICD-10-CM

## 2012-04-20 DIAGNOSIS — R55 Syncope and collapse: Secondary | ICD-10-CM

## 2012-04-20 DIAGNOSIS — F17200 Nicotine dependence, unspecified, uncomplicated: Secondary | ICD-10-CM

## 2012-04-20 DIAGNOSIS — I10 Essential (primary) hypertension: Secondary | ICD-10-CM

## 2012-04-20 MED ORDER — ATENOLOL 50 MG PO TABS: 50.0000 mg | ORAL_TABLET | Freq: Two times a day (BID) | ORAL | Status: DC

## 2012-04-20 NOTE — Assessment & Plan Note (Signed)
Continues to be an issue.  May require him to begin disability-as he is no longer able to work.

## 2012-04-20 NOTE — Assessment & Plan Note (Signed)
Reports decreased down to 1/3 ppd.

## 2012-04-20 NOTE — Patient Instructions (Signed)
Dual-Chamber Pacemaker A pacemaker is a small, lightweight, battery-powered device that is implanted under the skin in the upper chest. Your caregiver may place a pacemaker if your heartbeat is too slow (bradycardia) or if you experience symptoms from a slow heartbeat. A dual-chamber pacemaker has 2 leads (electrodes) that are connected in your heart. One lead is placed in the upper chamber of the heart, called the right atrium. The second lead is placed in the lower part of the heart, called the right ventricle. Dual-chamber pacemakers may pace in both the upper chamber and lower chamber. By doing so, correct rhythm and function are often maintained. When the heart rate is too slow, the pacemaker senses the heartbeat and will pace the heart at a programmed rate. CAUSES  Different conditions can cause a slow heart rate. Some of these can include:  Sick sinus syndrome. This is a type of slow heart rate where the "pacemaker" of the heart does not work very well. It is often related to aging.  Heart attack (myocardial infarction). This can damage the heart muscle and cause a slow heart beat.  Heart block. This is a condition where the signal that causes the heart to beat does not communicate very well between the upper chambers of the heart and the lower chambers of the heart.  Some heart medications that control fast heart rates or other abnormal heart rhythms can also cause a slow heart rate. SYMPTOMS  A very slow heart rate results in the heart not pumping enough blood to your body. Symptoms of a slow heart rate can include:  Passing out (fainting).  Confusion.  Shortness of breath.  Tiredness (fatigue).  Ankle swelling.  Chest discomfort or pain. DIAGNOSIS  Tests will be done to look at how your heart works and beats. This can include:  A physical exam.  An electrocardiogram (ECG). An ECG records your heart beat on a strip of paper for your caregiver to look at.  Continuous ECG  monitoring:  Holter monitor or an Event monitor. These devices record your heart rhythm and can be worn for 24 or more hours at a time. Your caregiver can then look at the recorded history of your heartbeat.  An electrophysiology study. This is a test to study the heart's electrical system. If your heart has a disruption in its electrical pathway, a slow heart beat can occur. PACEMAKER IMPLANTATION  Do not eat or drink for 6 hours before the procedure or as told by your caregiver.  Pacemaker implantation usually takes about one hour.  Your skin on your upper chest will be cleaned with germ-killing soap.  Sedation will be given through an IV. This will help you relax during the procedure.  The site of the incision, often just below a collarbone, will be injected with numbing medicine.  The insulated electrode is inserted through a large vein in your chest. Then, using a special type of X-ray (fluoroscopy), the tip is positioned in the target area of your heart. The end of the pacemaker lead is fixated to your heart by a corkscrew tip or by small "tines" (soft anchor hooks).  The connection between the pacemaker electrode and the heart is checked to ensure optimal contact and placement.  After your pacemaker is implanted, you will need to stay in the hospital to make sure the pacemaker is working correctly. You will be able to go home when your caregiver feels it is safe for you to do so. HOME CARE INSTRUCTIONS  Excessive movement of the arm next to the new pacemaker can cause the electrodes to dislodge. Your caregiver will determine how many days the upper arm should not be moved excessively. It is usually three or more days.  The incision needs to be kept dry as told by your caregiver. As with any surgery, if the incision becomes swollen, red or pus (yellow or tan drainage) appears, call your caregiver right away.  Your caregiver may use small strips of tape hold the incision closed.  They should be allowed to fall off naturally. Do not pull the strips of tape off.  Digital cell phones should be kept 12 inches away from the pacemaker. Hold them at the ear on the side opposite of the pacer.  Never leave a cell phone in a pocket over the pacemaker.  Avoid strong electro-magnetic fields. You will not be able to have an MRI scan because of the strong magnets.  The pacemaker battery should last several years. The pacemaker needs to be checked at regular intervals as told by your caregiver. RISKS AND COMPLICATIONS An implanted pacemaker has risks. Some of these can include:  Infection.  The pacemaker electrode can become dislodged. If this should happen, a second surgery would be needed to reposition it.  During pacemaker implantation, it is possible to puncture the lung. This is a very rare occurrence. SEEK MEDICAL CARE IF:   You have dizziness or pass out.  You feel your heart "skipping" beats or feel your heart "racing."  Hiccups that do not go away.  You develop redness, swelling or pain at the pacemaker insertion site.  The pacemaker insertion site has yellow drainage or there is a bad odor coming from the insertion site.  An unexplained temperature of 102 F (38.9 C) or above develops. MAKE SURE YOU:   Understand these instructions.  Will watch your condition.  Will get help right away if you are not doing well or get worse. Document Released: 12/14/2008 Document Revised: 05/11/2011 Document Reviewed: 12/14/2008 [facility] Patient Information 2013 [place], [place]. Second Degree Atrioventricular Block Second degree atrioventricular block is a type of heart block. The heartbeat is a coordinated contraction between the upper and lower chambers of the heart. This coordinated contraction happens because of an electrical impulse that is sent from the upper chambers of the heart to the lower chambers of the heart. The electrical impulse causes the heart to beat and  pump blood. Normally, this electrical impulse is transmitted without delay. In a second degree heart block, an interruption occurs in the heart's electrical impulse between the upper and lower chambers of the heart. When this happens, the heart does not beat in a timely manner, which affects the amount of blood pumped by the heart. There are two types of 2nd degree heart block:   Mobitz Type 1. In this type of 2nd degree heart block, the electrical impulse is gradually delayed more and more until the heart misses a beat. This type of 2nd degree heart block is less serious than Mobitz type 2.  Mobitz Type 2. This type of 2nd degree heart block is more serious and can become a more severe form of heart block. With Mobitz type 2, some of the electrical signals are blocked and do not reach the lower chambers of the heart. This can occur suddenly and without warning. Some people may need a permanent pacemaker with this type of heart block. CAUSES  Second degree heart block may be a result of:  Age.  The heart's electrical system can degenerate due to the aging process.  Heart attack. A heart attack can cause scarring which can damage the heart's electrical system.  Open heart surgery can damage and scar areas of the heart which affect the heart' s electrical system.  Heart medications such as beta and calcium channel blockers. These kinds of medications can affect the electrical impulse of the heart and can slow the heart rate if the dosage is too high. SYMPTOMS   Mobitz type 1 - Usually, no symptoms are noticed, but a person may have the same symptoms listed under Mobitz type 2.  Mobitz type 2 - Compared to Mobitz type 1, there is a greater likelihood of experiencing the following symptoms:  Fatigue.  Shortness of breath.  Dizziness or lightheadedness.  Fainting.  Chest pain. DIAGNOSIS   Electrocardiogram (EKG). An EKG is a tracing of the heartbeat and can show a 2nd degree heart  block.  Holter monitor. A holter monitor is a continuous heart rhythm recording for 24 hours. This can be helpful in determining the kind of heart block you have and how it can be treated.  Electrophysiology (EP) study. This is a procedure that tests the electrical pathway of your heart. This type of test is done by a specialist who places long thin tubes (catheters) in your heart. The catheters are used to study your heart and record your heart's electrical signals. TREATMENT   Mobitz Type 1. Generally, no treatment is needed.  Mobitz Type 2. A permanent pacemaker may be needed.  Heart medications such as beta blockers or calcium channel blockers can slow the heart rate. Your caregiver may need to adjust your heart medication if this is the cause of your heart block. Adjusting your heart medication may reverse the heart block. SEEK MEDICAL CARE IF:   You have unexplained fatigue.  You feel lightheaded.  You feel faint.  You feel your heart skipping beats or your heart beats very fast. SEEK IMMEDIATE MEDICAL CARE IF:   You have severe chest pain, especially if the pain is crushing or pressure-like and spreads to the arms, back, neck, or jaw. This is an emergency. Do not wait to see if the pain will go away. Get medical help at once. Call your local emergency services (911 in the [place] Do not drive yourself to the hospital.  You notice increasing shortness of breath during rest, sleeping, or with activity.  You "black out" or faint. MAKE SURE YOU:   Understand these instructions.  Will watch your condition.  Will get help right away if you are not doing well or get worse. Document Released: 01/30/2008 Document Revised: 05/11/2011 Document Reviewed: 01/30/2008 [facility] Patient Information 2013 [place], [place].

## 2012-04-20 NOTE — Assessment & Plan Note (Signed)
BP looks good today, continue current regimen.

## 2012-04-20 NOTE — Progress Notes (Signed)
  Subjective:    Patient ID: Shaun Lutz, male    DOB: 05/12/1955, 57 y.o.   MRN: [id number]  Hypertension Pertinent negatives include no shortness of breath.    Here for the first time in > 1 year after syncopal episode on job.  He has since been let go of his job.  He was seen in the ED and they advised him to obtain f/u.  He has not seen cardiology for eval of pacemaker since insertion.  ED did call [employer] and he had no events detected by pace maker at time of syncope.  He has lost  Review of Systems  HENT: Negative for congestion.   Respiratory: Negative for shortness of breath and wheezing.   Cardiovascular: Negative for leg swelling.  Gastrointestinal: Negative for nausea, vomiting, abdominal pain and diarrhea.  Endocrine: Negative for polydipsia and polyuria.  Genitourinary: Negative for dysuria.       Objective:   Physical Exam  Vitals reviewed. Constitutional: He appears well-developed and well-nourished.  HENT:  Head: Normocephalic and atraumatic.  Eyes: No scleral icterus.  Neck: Neck supple.  Cardiovascular: Normal rate.   Pulmonary/Chest: Effort normal.  Abdominal: Soft.  Skin: Skin is warm.          Assessment & Plan:

## 2012-05-13 ENCOUNTER — Encounter: Payer: Self-pay | Admitting: Internal Medicine

## 2012-05-13 ENCOUNTER — Ambulatory Visit (INDEPENDENT_AMBULATORY_CARE_PROVIDER_SITE_OTHER): Payer: No Typology Code available for payment source | Admitting: Internal Medicine

## 2012-05-13 VITALS — BP 173/122 | HR 86 | Ht 68.0 in | Wt 119.0 lb

## 2012-05-13 DIAGNOSIS — R55 Syncope and collapse: Secondary | ICD-10-CM

## 2012-05-13 DIAGNOSIS — I441 Atrioventricular block, second degree: Secondary | ICD-10-CM

## 2012-05-13 DIAGNOSIS — Z950 Presence of cardiac pacemaker: Secondary | ICD-10-CM

## 2012-05-13 LAB — PACEMAKER DEVICE OBSERVATION
AL AMPLITUDE: 2 mv
AL THRESHOLD: 0.75 V
BAMS-0001: 175 {beats}/min
RV LEAD AMPLITUDE: 11.2 mv
RV LEAD IMPEDENCE PM: 544 Ohm
RV LEAD THRESHOLD: 0.75 V
VENTRICULAR PACING PM: 10

## 2012-05-13 NOTE — Patient Instructions (Addendum)
Your physician recommends that you schedule a follow-up appointment in: 3 MONTHS WITH DR [facility] CHECK

## 2012-05-13 NOTE — Assessment & Plan Note (Signed)
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

## 2012-05-13 NOTE — Assessment & Plan Note (Signed)
The patient's device was interrogated and the information was fully reviewed.  The device was reprogrammed to  As above 

## 2012-05-13 NOTE — Progress Notes (Signed)
Patient Care Team: Reva Bores, MD as PCP - General (Obstetrics and Gynecology)   HPI  Shaun Lutz is a 57 y.o. male Seen to establish pacemaker care. This was implanted 1/12 because of syncope and second degree heart block.it was done by Dr. Salena Saner.; however, he lost his insurance and was no longer able to be followed at [facility].cardiaccardiac evaluation at that time had demonstrated normal left ventricular function by echo.     He has had recurrent syncope as recently as 1 month ago  thse are stereotypical and assoc with warmth and diaphoresis and nausea;  They all occur while standing   Not in shower  Modest exercise intolerance but no edema Past Medical History  Diagnosis Date  . Hypertension     Past Surgical History  Procedure Laterality Date  . Pacemaker insertion      Current Outpatient Prescriptions  Medication Sig Dispense Refill  . aspirin 81 MG tablet Take 81 mg by mouth daily.        Marland Kitchen atenolol (TENORMIN) 50 MG tablet Take 1 tablet (50 mg total) by mouth 2 (two) times daily.  60 tablet  3   No current facility-administered medications for this visit.    No Known Allergies  Review of Systems negative except from HPI and PMH  Physical Exam BP 173/122  Pulse 86  Ht 5\' 8"  (1.727 m)  Wt 119 lb (53.978 kg)  BMI 18.1 kg/m2 Well developed and well nourished in no acute distress HENT normal E scleral and icterus clear Neck Supple JVP flat; carotids brisk and full Clear to ausculation *Regular rate and rhythm, no murmurs gallops or rub Soft with active bowel sounds No clubbing cyanosis no Edema Alert and oriented, grossly normal motor and sensory function Skin Warm and Dry vitiligo    Assessment and  Plan

## 2012-05-13 NOTE — Assessment & Plan Note (Signed)
As above.

## 2012-07-08 ENCOUNTER — Encounter: Payer: Self-pay | Admitting: Family Medicine

## 2012-07-08 ENCOUNTER — Ambulatory Visit (INDEPENDENT_AMBULATORY_CARE_PROVIDER_SITE_OTHER): Payer: No Typology Code available for payment source | Admitting: Family Medicine

## 2012-07-08 VITALS — BP 147/113 | HR 90 | Temp 97.9°F | Ht 68.0 in | Wt 118.0 lb

## 2012-07-08 DIAGNOSIS — I10 Essential (primary) hypertension: Secondary | ICD-10-CM

## 2012-07-08 DIAGNOSIS — G252 Other specified forms of tremor: Secondary | ICD-10-CM | POA: Insufficient documentation

## 2012-07-08 DIAGNOSIS — F17200 Nicotine dependence, unspecified, uncomplicated: Secondary | ICD-10-CM

## 2012-07-08 DIAGNOSIS — R259 Unspecified abnormal involuntary movements: Secondary | ICD-10-CM

## 2012-07-08 DIAGNOSIS — R251 Tremor, unspecified: Secondary | ICD-10-CM

## 2012-07-08 DIAGNOSIS — Z1211 Encounter for screening for malignant neoplasm of colon: Secondary | ICD-10-CM | POA: Insufficient documentation

## 2012-07-08 DIAGNOSIS — J449 Chronic obstructive pulmonary disease, unspecified: Secondary | ICD-10-CM

## 2012-07-08 DIAGNOSIS — Z7189 Other specified counseling: Secondary | ICD-10-CM | POA: Insufficient documentation

## 2012-07-08 DIAGNOSIS — R55 Syncope and collapse: Secondary | ICD-10-CM

## 2012-07-08 DIAGNOSIS — Z9181 History of falling: Secondary | ICD-10-CM

## 2012-07-08 DIAGNOSIS — F1021 Alcohol dependence, in remission: Secondary | ICD-10-CM

## 2012-07-08 LAB — LIPID PANEL
LDL Cholesterol: 91 mg/dL (ref 0–99)
Triglycerides: 81 mg/dL (ref <150)
VLDL: 16 mg/dL (ref 0–40)

## 2012-07-08 MED ORDER — LISINOPRIL 10 MG PO TABS: 10.0000 mg | ORAL_TABLET | Freq: Every day | ORAL | Status: DC

## 2012-07-08 NOTE — Assessment & Plan Note (Signed)
Check PFT's formally--may need meds.  Continue to work on smoking cessation.

## 2012-07-08 NOTE — Patient Instructions (Addendum)
Pulmonary Function Tests Your caregiver has scheduled you for pulmonary function testing. Pulmonary Function Tests (PFTs) are tests which help [place] know how your lungs are working. The lungs are the large organs in your chest on both sides of the heart which allow you to breathe. The main job of the lungs is ventilation. Ventilation is moving air in and out of the lungs. The air breathed in contains oxygen which allows you to live. When you breathe out, your lungs get rid of carbon dioxide, a waste product of breathing. The medical term for breathing in is inhalation. Breathing out is called exhalation. Some medical conditions interfere with breathing. This may be sudden and short lived as with pneumonia, or may be long standing such as with COPD (chronic obstructive pulmonary disease) that which may come as a result of years of smoking. CONDITIONS THAT INTERFERE WITH NORMAL BREATHING ARE CALLED RESTRICTIVE OR OBSTRUCTIVE  An obstructive condition occurs when air has difficulty flowing into the lungs due to resistance. This causes a decreased flow of air in the lungs. A restrictive condition occurs when the chest muscles are unable to expand adequately. This also causes a decreased flow of air in the lungs. USES OF PULMONARY FUNCTION TESTS Lung (pulmonary) function studies are used to find out causes of lung problems and what will be the best treatment. The "PFT" terms listed below refer to different procedures that measure lung function in different ways. Pulmonary function testing is quick, simple and safe for most people. There are many different reasons why PFTs may be ordered.   For healthy individuals as part of a routine physical examination  In industrial plants to follow how your lungs are working when exposed to chemicals over a long period of time  When an illness involving the lungs is suspected.  PFTs may be used to assess the lung function of patients prior to surgery or other procedures  in patients who have current lung and/or heart problems.  The test is also used for people who are smokers or who have other conditions that might be affected by surgery or other procedures. Some common measurements or values (terms) you may hear your caregiver use are:  Tidal volume (TV) - amount of air breathed in or out during normal breathing.  Minute volume (MV) - total amount of air breathed in and out per minute.  Vital capacity (VC) - total volume of air that can be breathed out after the largest breath in you can take.  Functional residual capacity (FRC) - amount of air remaining in lungs after normal breathing out.  Total lung capacity - total amount of air in the lungs with the largest breath you can take.  Forced vital capacity (FVC) - the amount of air forced out quickly after taking the largest possible breath.  Forced expiratory volume (FEV) - volume of air breathed out during the first, second, and third seconds of the FVC test.  Forced expiratory flow (FEF) - average rate of flow during the middle half of the FVC test.  Peak expiratory flow rate (PEFR) - maximum amount of air during forced breathing out. The values of these tests vary from person to person. Your values are compared to other people who have had the test and are similar to you in age, size, etc. They can also be compared to previous tests following treatment of lung disease. The tests can determine if lung function is getting better and if the treatments used are successful. COMPLICATIONS OF  TESTING MAY INCLUDE:  Light-headedness due to over breathing (hyperventilation).  An asthmatic attack from deep breathing. SOME REASONS FOR NOT DOING THE TEST INCLUDE:  Recent eye surgery, because of increased pressure inside the eyes during the procedure.  Recent abdominal or chest surgery, because of inability to take deep breaths.  Chest pain or heart problems.  Tuberculosis or respiratory infections, such as  pneumonia, a cold, or the flu. Discuss concerns with your caregiver before your procedure. PREPARATION FOR TEST   Avoid eating a large meal before your test.  Do not smoke before your test.  Take medications as ordered by your caregiver.  Wear comfortable clothing which will not interfere with breathing. If done as an outpatient, you should be present 60 minutes prior to your procedure or as directed.  DURING THE TEST  You will be given a soft nose clip to wear during the procedure so that all of your breaths will go through your mouth instead of your nose.  You will be given a sterile (germ-free) mouthpiece that will be attached to the spirometer. The spirometer is the machine that measures your breathing.  You will be instructed to perform various breathing maneuvers. The maneuvers will be done by inhaling (breathing in) and exhaling (breathing out). Depending on what measurements are ordered, you may be asked to repeat the maneuvers several times before the test is completed.  You may be given a bronchodilator after testing has been performed. A bronchodilator is a medication which makes the small air passages in your lungs larger. These medications usually make it easier to breathe. The tests are then repeated several minutes later after the bronchodilator has taken effect.  You will be monitored carefully during the procedure for faintness, dizziness, difficulty breathing, or any other problems. AFTER THE PROCEDURE   You may resume your usual diet, medications, and activities unless your physician advises you otherwise.  Your caregiver will go over your test results with you and determine what is causing your lung problems and what treatments may be helpful. Document Released: 10/10/2003 Document Revised: 05/11/2011 Document Reviewed: 02/15/2008 [facility] Patient Information 2013 [place], [place]. Hypertension As your heart beats, it forces blood through your arteries. This force is  your blood pressure. If the pressure is too high, it is called hypertension (HTN) or high blood pressure. HTN is dangerous because you may have it and not know it. High blood pressure may mean that your heart has to work harder to pump blood. Your arteries may be narrow or stiff. The extra work puts you at risk for heart disease, stroke, and other problems.  Blood pressure consists of two numbers, a higher number over a lower, 110/72, for example. It is stated as "110 over 72." The ideal is below 120 for the top number (systolic) and under 80 for the bottom (diastolic). Write down your blood pressure today. You should pay close attention to your blood pressure if you have certain conditions such as:  Heart failure.  Prior heart attack.  Diabetes  Chronic kidney disease.  Prior stroke.  Multiple risk factors for heart disease. To see if you have HTN, your blood pressure should be measured while you are seated with your arm held at the level of the heart. It should be measured at least twice. A one-time elevated blood pressure reading (especially in the Emergency Department) does not mean that you need treatment. There may be conditions in which the blood pressure is different between your right and left arms. It  is important to see your caregiver soon for a recheck. Most people have essential hypertension which means that there is not a specific cause. This type of high blood pressure may be lowered by changing lifestyle factors such as:  Stress.  Smoking.  Lack of exercise.  Excessive weight.  Drug/tobacco/alcohol use.  Eating less salt. Most people do not have symptoms from high blood pressure until it has caused damage to the body. Effective treatment can often prevent, delay or reduce that damage. TREATMENT  When a cause has been identified, treatment for high blood pressure is directed at the cause. There are a large number of medications to treat HTN. These fall into several  categories, and your caregiver will help you select the medicines that are best for you. Medications may have side effects. You should review side effects with your caregiver. If your blood pressure stays high after you have made lifestyle changes or started on medicines,   Your medication(s) may need to be changed.  Other problems may need to be addressed.  Be certain you understand your prescriptions, and know how and when to take your medicine.  Be sure to follow up with your caregiver within the time frame advised (usually within two weeks) to have your blood pressure rechecked and to review your medications.  If you are taking more than one medicine to lower your blood pressure, make sure you know how and at what times they should be taken. Taking two medicines at the same time can result in blood pressure that is too low. SEEK IMMEDIATE MEDICAL CARE IF:  You develop a severe headache, blurred or changing vision, or confusion.  You have unusual weakness or numbness, or a faint feeling.  You have severe chest or abdominal pain, vomiting, or breathing problems. MAKE SURE YOU:   Understand these instructions.  Will watch your condition.  Will get help right away if you are not doing well or get worse. Document Released: 02/16/2005 Document Revised: 05/11/2011 Document Reviewed: 10/07/2007 [facility] Patient Information 2013 [place], [place]. Smoking Cessation Quitting smoking is important to your health and has many advantages. However, it is not always easy to quit since nicotine is a very addictive drug. Often times, people try 3 times or more before being able to quit. This document explains the best ways for you to prepare to quit smoking. Quitting takes hard work and a lot of effort, but you can do it. ADVANTAGES OF QUITTING SMOKING  You will live longer, feel better, and live better.  Your body will feel the impact of quitting smoking almost immediately.  Within 20 minutes,  blood pressure decreases. Your pulse returns to its normal level.  After 8 hours, carbon monoxide levels in the blood return to normal. Your oxygen level increases.  After 24 hours, the chance of having a heart attack starts to decrease. Your breath, hair, and body stop smelling like smoke.  After 48 hours, damaged nerve endings begin to recover. Your sense of taste and smell improve.  After 72 hours, the body is virtually free of nicotine. Your bronchial tubes relax and breathing becomes easier.  After 2 to 12 weeks, lungs can hold more air. Exercise becomes easier and circulation improves.  The risk of having a heart attack, stroke, cancer, or lung disease is greatly reduced.  After 1 year, the risk of coronary heart disease is cut in half.  After 5 years, the risk of stroke falls to the same as a nonsmoker.  After 10  years, the risk of lung cancer is cut in half and the risk of other cancers decreases significantly.  After 15 years, the risk of coronary heart disease drops, usually to the level of a nonsmoker.  If you are pregnant, quitting smoking will improve your chances of having a healthy baby.  The people you live with, especially any children, will be healthier.  You will have extra money to spend on things other than cigarettes. QUESTIONS TO THINK ABOUT BEFORE ATTEMPTING TO QUIT You may want to talk about your answers with your caregiver.  Why do you want to quit?  If you tried to quit in the past, what helped and what did not?  What will be the most difficult situations for you after you quit? How will you plan to handle them?  Who can help you through the tough times? Your family? Friends? A caregiver?  What pleasures do you get from smoking? What ways can you still get pleasure if you quit? Here are some questions to ask your caregiver:  How can you help me to be successful at quitting?  What medicine do you think would be best for me and how should I take  it?  What should I do if I need more help?  What is smoking withdrawal like? How can I get information on withdrawal? GET READY  Set a quit date.  Change your environment by getting rid of all cigarettes, ashtrays, matches, and lighters in your home, car, or work. Do not let people smoke in your home.  Review your past attempts to quit. Think about what worked and what did not. GET SUPPORT AND ENCOURAGEMENT You have a better chance of being successful if you have help. You can get support in many ways.  Tell your family, friends, and co-workers that you are going to quit and need their support. Ask them not to smoke around you.  Get individual, group, or telephone counseling and support. Programs are available at [employer] and health centers. Call your local health department for information about programs in your area.  Spiritual beliefs and practices may help some smokers quit.  Download a "quit meter" on your computer to keep track of quit statistics, such as how long you have gone without smoking, cigarettes not smoked, and money saved.  Get a self-help book about quitting smoking and staying off of tobacco. LEARN NEW SKILLS AND BEHAVIORS  Distract yourself from urges to smoke. Talk to someone, go for a walk, or occupy your time with a task.  Change your normal routine. Take a different route to work. Drink tea instead of coffee. Eat breakfast in a different place.  Reduce your stress. Take a hot bath, exercise, or read a book.  Plan something enjoyable to do every day. Reward yourself for not smoking.  Explore interactive web-based programs that specialize in helping you quit. GET MEDICINE AND USE IT CORRECTLY Medicines can help you stop smoking and decrease the urge to smoke. Combining medicine with the above behavioral methods and support can greatly increase your chances of successfully quitting smoking.  Nicotine replacement therapy helps deliver nicotine to your  body without the negative effects and risks of smoking. Nicotine replacement therapy includes nicotine gum, lozenges, inhalers, nasal sprays, and skin patches. Some may be available over-the-counter and others require a prescription.  Antidepressant medicine helps people abstain from smoking, but how this works is unknown. This medicine is available by prescription.  Nicotinic receptor partial agonist medicine simulates the  effect of nicotine in your brain. This medicine is available by prescription. Ask your caregiver for advice about which medicines to use and how to use them based on your health history. Your caregiver will tell you what side effects to look out for if you choose to be on a medicine or therapy. Carefully read the information on the package. Do not use any other product containing nicotine while using a nicotine replacement product.  RELAPSE OR DIFFICULT SITUATIONS Most relapses occur within the first 3 months after quitting. Do not be discouraged if you start smoking again. Remember, most people try several times before finally quitting. You may have symptoms of withdrawal because your body is used to nicotine. You may crave cigarettes, be irritable, feel very hungry, cough often, get headaches, or have difficulty concentrating. The withdrawal symptoms are only temporary. They are strongest when you first quit, but they will go away within 10 14 days. To reduce the chances of relapse, try to:  Avoid drinking alcohol. Drinking lowers your chances of successfully quitting.  Reduce the amount of caffeine you consume. Once you quit smoking, the amount of caffeine in your body increases and can give you symptoms, such as a rapid heartbeat, sweating, and anxiety.  Avoid smokers because they can make you want to smoke.  Do not let weight gain distract you. Many smokers will gain weight when they quit, usually less than 10 pounds. Eat a healthy diet and stay active. You can always lose  the weight gained after you quit.  Find ways to improve your mood other than smoking. FOR MORE INFORMATION  www.smokefree.gov  Document Released: 02/10/2001 Document Revised: 08/18/2011 Document Reviewed: 05/28/2011 [facility] Patient Information 2013 [place], [place].

## 2012-07-08 NOTE — Assessment & Plan Note (Signed)
Not at goal--discussed with pt, risks, start Lisinopril.  F/u BP check here or with machine.

## 2012-07-08 NOTE — Progress Notes (Signed)
  Subjective:    Patient ID: Shaun Lutz, male    DOB: 1955-12-22, 57 y.o.   MRN: [id number]  HPI  Has not taken BP meds today, because not eaten. BP up when last saw cardiology also.  Needs colon cancer screen, PFT's Reports pneumovax with pace maker Work on smoking cessation, reports 1pack/week, exercising--reports SOB with dyspnea on exertion.  Review of Systems  Constitutional: Negative for chills and fatigue.  HENT: Negative for nosebleeds and congestion.   Respiratory: Positive for shortness of breath.   Cardiovascular: Negative for palpitations and leg swelling.  Gastrointestinal: Negative for nausea, vomiting, abdominal pain, diarrhea and anal bleeding.  Musculoskeletal: Negative for back pain.       Objective:   Physical Exam  Vitals reviewed. Constitutional: He appears well-developed and well-nourished.  HENT:  Head: Normocephalic and atraumatic.  Neck: Neck supple.  Cardiovascular: Normal rate and regular rhythm.   No murmur heard. Pulmonary/Chest: Effort normal and breath sounds normal. He has no wheezes.  Abdominal: Soft. There is no tenderness.  Lymphadenopathy:    He has no cervical adenopathy.          Assessment & Plan:

## 2012-07-08 NOTE — Assessment & Plan Note (Signed)
Smoking cessation discussed 

## 2012-07-08 NOTE — Assessment & Plan Note (Signed)
Improved since pacer adjusted by cardiology.

## 2012-07-14 ENCOUNTER — Ambulatory Visit: Payer: No Typology Code available for payment source | Admitting: Pharmacist

## 2012-07-15 ENCOUNTER — Encounter: Payer: Self-pay | Admitting: Pharmacist

## 2012-07-15 ENCOUNTER — Ambulatory Visit (INDEPENDENT_AMBULATORY_CARE_PROVIDER_SITE_OTHER): Payer: No Typology Code available for payment source | Admitting: Pharmacist

## 2012-07-15 VITALS — BP 149/79 | HR 76 | Ht 68.11 in | Wt 115.0 lb

## 2012-07-15 DIAGNOSIS — J449 Chronic obstructive pulmonary disease, unspecified: Secondary | ICD-10-CM

## 2012-07-15 DIAGNOSIS — F17200 Nicotine dependence, unspecified, uncomplicated: Secondary | ICD-10-CM

## 2012-07-15 NOTE — Assessment & Plan Note (Signed)
Nicotine replacement tx not initiated at this time. Pt plans on quitting after the 3 cigaretts in his pocket without any assistance. Will follow up with Dr. Shawnie Pons in 2-3 weeks. Return to Rx Clinic Visit as needed if unsuccessful with current quit attempt.   Total time in face-to-face counseling 45 minutes.  Patient seen with Franchot Erichsen , PharmD Resident and Richrd Humbles, PharmD candidate.

## 2012-07-15 NOTE — Progress Notes (Signed)
  Subjective:    Patient ID: Shaun Lutz, male    DOB: 1955/05/15, 57 y.o.   MRN: [id number]  HPI  Pt arrives and walks in with good spirits w/ partner [name] of 14 years. Appointment with RX clinic for spriometry test.   Age when started using tobacco on a daily basis: unknown, its been a long time. Number of Cigarettes per day: less than 1/2 pack, more than 1 or 2. Brand smoked [place]. Estimated Nicotine Content per Cigarette (mg) 1mg .  Estimated Nicotine intake per day 7-8mg .   Smokes first cigarette within 5 minutes after waking. Smokes times per night: 1 to 2. Estimated Fagerstrom Score ~5/10.  Most recent quit attempt: quit for 1 month, 2 years ago. Longest time ever been tobacco free 1 month. What Medications (NRT, bupropion, varenicline) used in past: none.  Triggers to use tobacco include; after meals, stress, bored, habit.    Review of Systems Pt endorses SOB on exertion, can only walk up 3 steps up stairs before he gets winded. Wakes in the middle of the time w/ coughing & SOB. Pt has pacemaker, put in ~2years ago.    Objective:   Physical Exam  mMRC score= 2  See Documentation Flowsheet (discrete results - PFTs) for complete Spirometry results. Patient provided good effort while attempting spirometry.   Lung Age = 89 years Albuterol Neb  Lot# [id number]     Exp. Nov '15  97-98 O2Sat on resting, decreased to 88-89 with walking in clinic. Lung exam: CTA per Dr. 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  Patient has been experiencing SOB on exertion for several years. Currently doesn't use any medications for management. no change of treatment plan at this time except for smoking cessation. May consider trial of Dulera samples in future.  Reevaluate O2Sat with exertion at follow up visit. Reviewed results of pulmonary function tests.  Pt verbalized understanding of results.  Written pt  instructions provided.    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    Nicotine replacement tx not initiated at this time. Pt plans on quitting after the 3 cigaretts in his pocket without any assistance. Will follow up with Dr. Shawnie Pons in 2-3 weeks. Return to Rx Clinic Visit as needed if unsuccessful with current quit attempt.   Total time in face-to-face counseling 45 minutes.  Patient seen with Franchot Erichsen , PharmD Resident and Richrd Humbles, PharmD candidate.

## 2012-07-15 NOTE — Progress Notes (Signed)
Patient ID: Shaun Lutz, male   DOB: 11-Jan-1956, 57 y.o.   MRN: [id number] Reviewed: Agree with Dr. Macky Lower documentation and management

## 2012-07-15 NOTE — Patient Instructions (Addendum)
Thank you for coming in today!  Please quit smoking.   Please follow up with Dr. Shawnie Pons in the next 2-3 weeks.

## 2012-07-15 NOTE — Assessment & Plan Note (Signed)
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  Patient has been experiencing SOB on exertion for several years. Currently doesn't use any medications for management. no change of treatment plan at this time except for smoking cessation. May consider trial of Dulera samples in future.  Reviewed results of pulmonary function tests.  Pt verbalized understanding of results.  Written pt instructions provided.    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    Nicotine replacement tx not initiated at this time. Pt plans on quitting after the 3 cigaretts in his pocket without any assistance. Will follow up with Dr. Shawnie Pons in 2-3 weeks. Return to Rx Clinic Visit as needed if unsuccessful with current quit attempt.   Total time in face-to-face counseling 45 minutes.  Patient seen with Franchot Erichsen , PharmD Resident and Richrd Humbles, PharmD candidate.

## 2012-08-10 ENCOUNTER — Encounter: Payer: Self-pay | Admitting: Family Medicine

## 2012-08-10 ENCOUNTER — Ambulatory Visit (INDEPENDENT_AMBULATORY_CARE_PROVIDER_SITE_OTHER): Payer: No Typology Code available for payment source | Admitting: Family Medicine

## 2012-08-10 VITALS — BP 91/61 | HR 91 | Temp 97.9°F | Ht 68.11 in | Wt 114.0 lb

## 2012-08-10 DIAGNOSIS — Z7189 Other specified counseling: Secondary | ICD-10-CM

## 2012-08-10 DIAGNOSIS — R5381 Other malaise: Secondary | ICD-10-CM

## 2012-08-10 DIAGNOSIS — R636 Underweight: Secondary | ICD-10-CM

## 2012-08-10 DIAGNOSIS — I10 Essential (primary) hypertension: Secondary | ICD-10-CM

## 2012-08-10 MED ORDER — LISINOPRIL 5 MG PO TABS: 5.0000 mg | ORAL_TABLET | Freq: Every day | ORAL | Status: DC

## 2012-08-10 NOTE — Assessment & Plan Note (Signed)
Too low BP today, have decreased his lisinopril to 5 mg daily.  May need further drop.  Could consider decreasing his atenolol also if ok with cards. May help with weakness.

## 2012-08-10 NOTE — Progress Notes (Signed)
  Subjective:    Patient ID: Shaun Lutz, male    DOB: [birth year], 57 y.o.   MRN: [id number]  Hypertension Associated symptoms include shortness of breath. Pertinent negatives include no chest pain.    Pt. Quit smoking x 2 wks without much difficulty.  PFT's done and reviewed.  Mild restrictive lung disease.  Just feeling weak.  Reports lying in bed much of day, because "I don't have anywhere to go".  This after quitting smoking. Started Lisinopril at last visit, and BP is down to 91/61 today. Taking Ensure, and weight is stable.  He will see cards soon.  Is on beta-blocker at 50.  Had h/o non-sustained Vtach.  No other tachycardias noted.    Review of Systems  Constitutional: Negative for appetite change and unexpected weight change.  Respiratory: Positive for shortness of breath. Negative for chest tightness.        Dyspnea on exertion.  Cardiovascular: Negative for chest pain.  Gastrointestinal: Negative for abdominal pain.  Musculoskeletal: Negative for arthralgias.  Skin: Positive for color change (vitiligo).       Objective:   Physical Exam  Vitals reviewed. Constitutional: No distress.  thin  HENT:  Head: Normocephalic and atraumatic.  Neck: Neck supple.  Cardiovascular: Normal rate and regular rhythm.   No murmur heard. Pulmonary/Chest: Effort normal and breath sounds normal.  Abdominal: Soft. There is no tenderness.  Lymphadenopathy:    He has cervical adenopathy.          Assessment & Plan:

## 2012-08-10 NOTE — Assessment & Plan Note (Signed)
Continued encouragement to continue with quitting smoking.

## 2012-08-10 NOTE — Assessment & Plan Note (Signed)
Advised to find a hobby, and increase activity slowly.

## 2012-08-10 NOTE — Assessment & Plan Note (Signed)
Advised to continue Ensure supplementation

## 2012-08-10 NOTE — Patient Instructions (Addendum)
Congratulations on quitting smoking!  That is the best thing you can do for your health.  You need to improve your exercise and strength.  That means getting up and moving.  Start small and increase as you can tolerate it.  Hypertension As your heart beats, it forces blood through your arteries. This force is your blood pressure. If the pressure is too high, it is called hypertension (HTN) or high blood pressure. HTN is dangerous because you may have it and not know it. High blood pressure may mean that your heart has to work harder to pump blood. Your arteries may be narrow or stiff. The extra work puts you at risk for heart disease, stroke, and other problems.  Blood pressure consists of two numbers, a higher number over a lower, 110/72, for example. It is stated as "110 over 72." The ideal is below 120 for the top number (systolic) and under 80 for the bottom (diastolic). Write down your blood pressure today. You should pay close attention to your blood pressure if you have certain conditions such as:  Heart failure.  Prior heart attack.  Diabetes  Chronic kidney disease.  Prior stroke.  Multiple risk factors for heart disease. To see if you have HTN, your blood pressure should be measured while you are seated with your arm held at the level of the heart. It should be measured at least twice. A one-time elevated blood pressure reading (especially in the Emergency Department) does not mean that you need treatment. There may be conditions in which the blood pressure is different between your right and left arms. It is important to see your caregiver soon for a recheck. Most people have essential hypertension which means that there is not a specific cause. This type of high blood pressure may be lowered by changing lifestyle factors such as:  Stress.  Smoking.  Lack of exercise.  Excessive weight.  Drug/tobacco/alcohol use.  Eating less salt. Most people do not have symptoms from high  blood pressure until it has caused damage to the body. Effective treatment can often prevent, delay or reduce that damage. TREATMENT  When a cause has been identified, treatment for high blood pressure is directed at the cause. There are a large number of medications to treat HTN. These fall into several categories, and your caregiver will help you select the medicines that are best for you. Medications may have side effects. You should review side effects with your caregiver. If your blood pressure stays high after you have made lifestyle changes or started on medicines,   Your medication(s) may need to be changed.  Other problems may need to be addressed.  Be certain you understand your prescriptions, and know how and when to take your medicine.  Be sure to follow up with your caregiver within the time frame advised (usually within two weeks) to have your blood pressure rechecked and to review your medications.  If you are taking more than one medicine to lower your blood pressure, make sure you know how and at what times they should be taken. Taking two medicines at the same time can result in blood pressure that is too low. SEEK IMMEDIATE MEDICAL CARE IF:  You develop a severe headache, blurred or changing vision, or confusion.  You have unusual weakness or numbness, or a faint feeling.  You have severe chest or abdominal pain, vomiting, or breathing problems. MAKE SURE YOU:   Understand these instructions.  Will watch your condition.  Will get  help right away if you are not doing well or get worse. Document Released: 02/16/2005 Document Revised: 05/11/2011 Document Reviewed: 10/07/2007 [facility] Patient Information 2014 [place].

## 2012-08-22 ENCOUNTER — Ambulatory Visit (INDEPENDENT_AMBULATORY_CARE_PROVIDER_SITE_OTHER): Payer: No Typology Code available for payment source | Admitting: Internal Medicine

## 2012-08-22 ENCOUNTER — Encounter: Payer: Self-pay | Admitting: Internal Medicine

## 2012-08-22 VITALS — BP 91/68 | HR 66 | Ht 68.0 in | Wt 112.0 lb

## 2012-08-22 DIAGNOSIS — I959 Hypotension, unspecified: Secondary | ICD-10-CM | POA: Insufficient documentation

## 2012-08-22 DIAGNOSIS — Z950 Presence of cardiac pacemaker: Secondary | ICD-10-CM

## 2012-08-22 DIAGNOSIS — I441 Atrioventricular block, second degree: Secondary | ICD-10-CM

## 2012-08-22 DIAGNOSIS — I10 Essential (primary) hypertension: Secondary | ICD-10-CM

## 2012-08-22 LAB — PACEMAKER DEVICE OBSERVATION
AL AMPLITUDE: 5.6 mv
AL THRESHOLD: 0.5 V
BAMS-0001: 175 {beats}/min
BATTERY VOLTAGE: 2.79 V
RV LEAD AMPLITUDE: 15.68 mv

## 2012-08-22 NOTE — Assessment & Plan Note (Signed)
Stable post pacing 

## 2012-08-22 NOTE — Progress Notes (Signed)
Patient Care Team: Reva Bores, MD as PCP - General (Obstetrics and Gynecology)   HPI  Shaun Lutz is a 57 y.o. male Seen In followup for a pacemaker implanted 1/12 because of syncope and second degree heart block.it was done by Dr. Salena Saner.; however, he lost his insurance and was no longer able to be followed at [facility]    Cardiac evaluation at that time had demonstrated normal left ventricular function by echo.    he has had no recurrent syncope. He has stopped smoking.  At the last visit we also activated reversed once in his exercise tolerance is much improved; he denies chest pain shortness of breath or edema  Past Medical History  Diagnosis Date  . Hypertension   . Second degree AV block   . Syncope   . Pacemaker -[employer]     DOI 2012    Past Surgical History  Procedure Laterality Date  . Pacemaker insertion      Current Outpatient Prescriptions  Medication Sig Dispense Refill  . aspirin 81 MG tablet Take 81 mg by mouth daily.        Marland Kitchen atenolol (TENORMIN) 50 MG tablet Take 1 tablet (50 mg total) by mouth 2 (two) times daily.  60 tablet  3  . lisinopril (PRINIVIL,ZESTRIL) 5 MG tablet Take 1 tablet (5 mg total) by mouth daily.  90 tablet  3   No current facility-administered medications for this visit.    No Known Allergies  Review of Systems negative except from HPI and PMH  Physical Exam BP 91/68  Pulse 66  Ht 5\' 8"  (1.727 m)  Wt 112 lb (50.803 kg)  BMI 17.03 kg/m2 Well developed and nourished in no acute distress HENT normal Neck supple with JVP-flat Clear Regular rate and rhythm, no murmurs or gallops Abd-soft with active BS No Clubbing cyanosis edema Skin-warm and dry A & Oriented  Grossly normal sensory and motor function     Assessment and  Plan

## 2012-08-22 NOTE — Assessment & Plan Note (Signed)
Will decrease atenolol

## 2012-08-22 NOTE — Assessment & Plan Note (Signed)
The patient's device was interrogated.  The information was reviewed. No changes were made in the programming.   Will initiate remote followup at next visit

## 2012-08-22 NOTE — Patient Instructions (Addendum)
Your physician has recommended you make the following change in your medication:  1) Decrease atenolol to 50 mg one tablet by mouth once daily  Your physician wants you to follow-up in: 6 months with Dr. Graciela Husbands. You will receive a reminder letter in the mail two months in advance. If you don't receive a letter, please call our office to schedule the follow-up appointment.

## 2012-10-25 ENCOUNTER — Telehealth: Payer: Self-pay | Admitting: Family Medicine

## 2012-10-25 DIAGNOSIS — I10 Essential (primary) hypertension: Secondary | ICD-10-CM

## 2012-10-25 MED ORDER — ATENOLOL 50 MG PO TABS: ORAL_TABLET | ORAL | Status: DC

## 2012-10-25 NOTE — Telephone Encounter (Signed)
Will forward to MD. Jazmin Hartsell,CMA  

## 2012-10-25 NOTE — Telephone Encounter (Signed)
Patient is completely out of his atenolol and needs a refill. Patient states pharmacy sent request yesterday w/ no response.

## 2012-11-23 ENCOUNTER — Encounter: Payer: Self-pay | Admitting: Family Medicine

## 2012-11-23 ENCOUNTER — Ambulatory Visit (INDEPENDENT_AMBULATORY_CARE_PROVIDER_SITE_OTHER): Payer: Self-pay | Admitting: Family Medicine

## 2012-11-23 VITALS — BP 118/87 | HR 89 | Temp 97.7°F | Ht 68.0 in | Wt 116.0 lb

## 2012-11-23 DIAGNOSIS — I959 Hypotension, unspecified: Secondary | ICD-10-CM

## 2012-11-23 DIAGNOSIS — H9190 Unspecified hearing loss, unspecified ear: Secondary | ICD-10-CM

## 2012-11-23 DIAGNOSIS — H9193 Unspecified hearing loss, bilateral: Secondary | ICD-10-CM

## 2012-11-23 DIAGNOSIS — H6122 Impacted cerumen, left ear: Secondary | ICD-10-CM

## 2012-11-23 DIAGNOSIS — H6123 Impacted cerumen, bilateral: Secondary | ICD-10-CM

## 2012-11-23 DIAGNOSIS — H6120 Impacted cerumen, unspecified ear: Secondary | ICD-10-CM | POA: Insufficient documentation

## 2012-11-23 DIAGNOSIS — R636 Underweight: Secondary | ICD-10-CM

## 2012-11-23 DIAGNOSIS — R5381 Other malaise: Secondary | ICD-10-CM

## 2012-11-23 NOTE — Assessment & Plan Note (Signed)
Improved since decreasing his medication.

## 2012-11-23 NOTE — Assessment & Plan Note (Signed)
S/p irrigation and removal.

## 2012-11-23 NOTE — Patient Instructions (Addendum)
Presbycusis Age-related hearing loss (presbycusis) affects nearly one third of the elderly. It generally starts around middle age and is more common in men. The changes causing this take place in the cochlea, a cavity in the middle ear. This contains many tiny hairs that convert sound vibrations into electrical impulses, which are interpreted by your brain. As we grow older this type of hearing loss is termed sensoryneural hearing loss. It is permanent and cannot be corrected surgically. People with this type of hearing loss will probably need hearing aids. HOME CARE INSTRUCTIONS   Learning to read lips will be helpful.  Face the person speaking to you to assist with lip reading.  Watch cues such as hand gestures and facial expressions of person talking to you.  Decrease, or if possible, avoid background noise.  Ask your caregiver if hearing aids are appropriate to help with your type of hearing loss.  Local civic and community groups may have means for financial help in obtaining hearing aids if they aren't covered by your health insurance.  If you have hearing aids, do not wear them in the shower or while swimming.  Keep hearing aids dry, clean, and away from chemicals, including hair care products. Document Released: 02/14/2000 Document Revised: 05/11/2011 Document Reviewed: 02/16/2005 [facility] Patient Information 2014 [place], [place]. Pacemaker Battery Change A pacemaker battery usually lasts 4 to 12 years. Once or twice per year, you will be asked to visit your caregiver to have a full evaluation of your pacemaker. When a battery needs to be replaced, the entire pacemaker is actually replaced so that you can benefit from new circuitry and any new features that have recently been added to pacemakers. Most often, this procedure is very simple because the leads are already in place. After giving medicine to numb the skin, your health care provider makes a cut to reopen the pocket holding the  pacemaker and disconnects the old device from its leads. The leads are routinely tested at this time. If they are working okay, the new pacemaker may simply be connected to the existing leads. If there is any problem with the old lead system, it may be wise to replace the lead system while inserting the new pacemaker. There are many things that affect how long a pacemaker battery will last:  Age of the pacemaker.  Number of leads (1, 2 or 3).  Pacemaker work load. If the pacemaker is helping the heart more often, then the battery will not last as long as if the pacemaker does not need to help the heart.  Resistance of the leads. The greater the resistance, the greater the drain on the battery. This can increase as the leads get older or if one or more of the leads does not have the best contact with the heart.  Power (voltage) settings.  The health of the person's heart. If the health of the heart gets worse, then the pacemaker may have to work more often and the setting changed to accommodate these changes. Your health care provider will be alerted to the fact that it is time to replace the battery during follow-up exams. He or she will check your pacemaker using a small table-top computer, called a programmer, and a wand. The wand is about the same size as a remote control. Your provider puts the wand on your body in the area where the pacemaker is located. Information from the pacemaker is received about how well your heart is working and the status of the  battery. It is not painful, and it usually takes just a few minutes. You will have plenty of time before the battery is fully used up to plan for replacement.  LET YOUR CAREGIVER KNOW ABOUT:   Symptoms of chest pain, trouble breathing, palpitations, lightheadedness, or feelings of an abnormal or irregular heart beat.  Allergies.  Medications taken including herbs, eye drops, over the counter medications, and creams  Use of steroids (by  mouth or creams).  Possible pregnancy, if applicable.  Previous problems with anesthetics or Novocaine.  History of blood clots (thrombophlebitis).  History of bleeding or blood problems.  Surgery since your last pacemaker placement.  Other health problems. RISKS AND COMPLICATIONS These are very uncommon but include:  Bleeding.  Bruising of the skin around where the incision was made.  Pain at the site of the incision.  Pulling apart of the skin at the incision site.  Infection.  Allergic reaction to anesthetics or medicines used during the procedure. Diabetics may have a temporary increase in their blood sugar after any surgical procedure.  BEFORE THE PROCEDURE  Wash all of the skin around the area of the chest where the pacemaker is located. Try to remove any loose, scaling skin. Unless advised otherwise, avoid using aspirin, ibuprofen, or naproxen for 3-4 days before the procedure. Ask your caregiver for help with any other medication adjustments before the pacemaker is replaced. Unless advised otherwise, do not eat or drink after midnight on the night before the procedure EXCEPT for drinking water and taking your medications as you normally would. AFTER THE PROCEDURE   A heart monitor and the pacemaker programmer will be used to make sure that the new pacemaker is working properly.  You can go home after the procedure.  Your caregiver will advise you if you need to have any stitches. They will be removed 5-7 days after the procedure. HOME CARE INSTRUCTIONS   Keep the incision clean and dry.  Unless advised otherwise, you may shower beginning 48 hours after your procedure.  For the first week after the replacement, avoid stretching motions that pull at the incision site and avoid heavy exercise with the arm on the same side as the incision.  Only take over-the-counter or prescription medicines for pain, discomfort, or fever as directed by your caregiver.  Your  caregiver will tell you when you will need to next test your pacemaker by telephone or when to return to the office for re-exam and/or removal of stitches, if necessary. SEEK MEDICAL CARE IF:   You have unusual pain at the incision site that is not adequately helped by over-the-counter or prescription medicine.  There is drainage or pus from the incision site.  You develop red streaking that extends above or below the incision site.  You feel brief intermittent palpitations, lightheadedness or any symptoms that you feel might be related to your heart. SEEK IMMEDIATE MEDICAL CARE IF:   You experience chest pain that is different than the pain at the incision site.  You experience:  Shortness of breath.  Palpitations.  Irregular heart beat.  Lightheadedness that does not go away quickly.  Fainting.  You develop a fever.  You have pain that gets worse even though you are taking pain medicine. MAKE SURE YOU:   Understand these instructions.  Will watch your condition.  Will get help right away if you are not doing well or get worse. Document Released: 05/27/2006 Document Revised: 11/11/2011 Document Reviewed: 08/30/2006 [facility] Patient Information 2014 [place], [place].  Cerumen Impaction A cerumen impaction is when the wax in your ear forms a plug. This plug usually causes reduced hearing. Sometimes it also causes an earache or dizziness. Removing a cerumen impaction can be difficult and painful. The wax sticks to the ear canal. The canal is sensitive and bleeds easily. If you try to remove a heavy wax buildup with a cotton tipped swab, you may push it in further. Irrigation with water, suction, and small ear curettes may be used to clear out the wax. If the impaction is fixed to the skin in the ear canal, ear drops may be needed for a few days to loosen the wax. People who build up a lot of wax frequently can use ear wax removal products available in your local drugstore. SEEK  MEDICAL CARE IF:  You develop an earache, increased hearing loss, or marked dizziness. Document Released: 03/26/2004 Document Revised: 05/11/2011 Document Reviewed: 05/16/2009 [facility] Patient Information 2014 [place], [place].

## 2012-11-23 NOTE — Progress Notes (Signed)
  Subjective:    Patient ID: Shaun Lutz, male    DOB: 24-Nov-1955, 57 y.o.   MRN: [id number]  Hypertension Pertinent negatives include no shortness of breath.   Here today for f/u.  Has seen cardiology and they checked his pace maker.  It is ok.  They also decreased his metoprolol.  I had previously decreased his lisinopril, as his BP was very low and he was complaining of decreased energy.  BP is up. Wife is complaining of poor hearing.  He states it has been a problem for a while. He is taking Ensure and his weight is up 4 pounds.   Review of Systems  Constitutional: Negative for fever and chills.  HENT: Positive for hearing loss.   Eyes: Negative for visual disturbance.  Respiratory: Negative for cough and shortness of breath.   Cardiovascular: Negative for leg swelling.  Gastrointestinal: Negative for abdominal pain.       Objective:   Physical Exam  Vitals reviewed. Constitutional: He appears well-developed and well-nourished.  HENT:  Cerumen impaction on left--Unable to see either drum well.  Neck: Neck supple.  Cardiovascular: Normal rate and regular rhythm.   No murmur heard. Pulmonary/Chest: Effort normal. No respiratory distress.  Abdominal: There is no tenderness.  Musculoskeletal: He exhibits no edema.  Skin:  Depigmentation noted on hands and neck.          Assessment & Plan:

## 2012-11-23 NOTE — Assessment & Plan Note (Signed)
Slight improvement.  Up 4 pounds.  Continue Ensure.

## 2012-11-23 NOTE — Assessment & Plan Note (Signed)
Improving.

## 2013-02-17 ENCOUNTER — Ambulatory Visit: Payer: Self-pay

## 2013-02-20 ENCOUNTER — Ambulatory Visit (INDEPENDENT_AMBULATORY_CARE_PROVIDER_SITE_OTHER): Payer: Medicare Other | Admitting: *Deleted

## 2013-02-20 DIAGNOSIS — I441 Atrioventricular block, second degree: Secondary | ICD-10-CM | POA: Diagnosis not present

## 2013-02-20 LAB — MDC_IDC_ENUM_SESS_TYPE_INCLINIC
Brady Statistic AS VS Percent: 29 %
Lead Channel Impedance Value: 523 Ohm
Lead Channel Pacing Threshold Pulse Width: 0.4 ms
Lead Channel Sensing Intrinsic Amplitude: 8 mV
Lead Channel Setting Pacing Amplitude: 2 V
Lead Channel Setting Pacing Pulse Width: 0.4 ms
Lead Channel Setting Sensing Sensitivity: 2.8 mV

## 2013-02-20 NOTE — Progress Notes (Signed)
Pacemaker check in clinic. Normal device function. Thresholds, sensing, impedances consistent with previous measurements. Device programmed to maximize longevity. 20 mode switches all 1:1 conduction, <0.1%.  6 high ventricular rates noted 3-11 seconds SVT. Device programmed at appropriate safety margins. Histogram distribution appropriate for patient activity level. Device programmed to optimize intrinsic conduction. Estimated longevity 10.5years. Patient enrolled in remote follow-up/TTM's with [employer]. Plan to follow every 3 months remotely and see annually in office. Patient education completed.  [facility] 05/24/13.

## 2013-03-17 ENCOUNTER — Encounter: Payer: Self-pay | Admitting: Internal Medicine

## 2013-05-22 ENCOUNTER — Ambulatory Visit (INDEPENDENT_AMBULATORY_CARE_PROVIDER_SITE_OTHER): Payer: Medicare Other | Admitting: Family Medicine

## 2013-05-22 ENCOUNTER — Encounter: Payer: Self-pay | Admitting: Family Medicine

## 2013-05-22 VITALS — BP 132/89 | HR 81 | Temp 98.3°F | Ht 68.0 in | Wt 119.0 lb

## 2013-05-22 DIAGNOSIS — R55 Syncope and collapse: Secondary | ICD-10-CM

## 2013-05-22 DIAGNOSIS — R636 Underweight: Secondary | ICD-10-CM

## 2013-05-22 DIAGNOSIS — I10 Essential (primary) hypertension: Secondary | ICD-10-CM | POA: Diagnosis not present

## 2013-05-22 DIAGNOSIS — R5383 Other fatigue: Secondary | ICD-10-CM

## 2013-05-22 DIAGNOSIS — I441 Atrioventricular block, second degree: Secondary | ICD-10-CM | POA: Diagnosis not present

## 2013-05-22 DIAGNOSIS — R5381 Other malaise: Secondary | ICD-10-CM

## 2013-05-22 DIAGNOSIS — Z125 Encounter for screening for malignant neoplasm of prostate: Secondary | ICD-10-CM

## 2013-05-22 DIAGNOSIS — R269 Unspecified abnormalities of gait and mobility: Secondary | ICD-10-CM | POA: Diagnosis not present

## 2013-05-22 LAB — CBC
HEMATOCRIT: 37.1 % — AB (ref 39.0–52.0)
Hemoglobin: 12.7 g/dL — ABNORMAL LOW (ref 13.0–17.0)
MCH: 29.8 pg (ref 26.0–34.0)
MCHC: 34.2 g/dL (ref 30.0–36.0)
MCV: 87.1 fL (ref 78.0–100.0)
PLATELETS: 258 10*3/uL (ref 150–400)
RBC: 4.26 MIL/uL (ref 4.22–5.81)
RDW: 12.9 % (ref 11.5–15.5)
WBC: 5.2 10*3/uL (ref 4.0–10.5)

## 2013-05-22 LAB — COMPREHENSIVE METABOLIC PANEL
ALT: 17 U/L (ref 0–53)
AST: 16 U/L (ref 0–37)
Albumin: 3.5 g/dL (ref 3.5–5.2)
Alkaline Phosphatase: 65 U/L (ref 39–117)
BILIRUBIN TOTAL: 0.3 mg/dL (ref 0.2–1.2)
BUN: 12 mg/dL (ref 6–23)
CALCIUM: 8.7 mg/dL (ref 8.4–10.5)
CO2: 32 mEq/L (ref 19–32)
CREATININE: 0.69 mg/dL (ref 0.50–1.35)
Chloride: 97 mEq/L (ref 96–112)
Glucose, Bld: 116 mg/dL — ABNORMAL HIGH (ref 70–99)
Potassium: 4.6 mEq/L (ref 3.5–5.3)
Sodium: 132 mEq/L — ABNORMAL LOW (ref 135–145)
Total Protein: 7.3 g/dL (ref 6.0–8.3)

## 2013-05-22 MED ORDER — ATENOLOL 50 MG PO TABS
ORAL_TABLET | ORAL | Status: DC
Start: 2013-05-22 — End: 2013-06-07

## 2013-05-22 MED ORDER — ENSURE ACTIVE HIGH PROTEIN PO LIQD: 1.0000 | Freq: Three times a day (TID) | ORAL | Status: DC

## 2013-05-22 MED ORDER — ASPIRIN 81 MG PO TABS: 81.0000 mg | ORAL_TABLET | Freq: Every day | ORAL | Status: DC

## 2013-05-22 MED ORDER — LISINOPRIL 5 MG PO TABS: 5.0000 mg | ORAL_TABLET | Freq: Every day | ORAL | Status: DC

## 2013-05-22 NOTE — Assessment & Plan Note (Addendum)
Continue Ensure, check TSH

## 2013-05-22 NOTE — Patient Instructions (Signed)
Preventive Care for Adults, Male A healthy lifestyle and preventive care can promote health and wellness. Preventive health guidelines for men include the following key practices:  A routine yearly physical is a good way to check with your health care provider about your health and preventative screening. It is a chance to share any concerns and updates on your health and to receive a thorough exam.  Visit your dentist for a routine exam and preventative care every 6 months. Brush your teeth twice a day and floss once a day. Good oral hygiene prevents tooth decay and gum disease.  The frequency of eye exams is based on your age, health, family medical history, use of contact lenses, and other factors. Follow your health care provider's recommendations for frequency of eye exams.  Eat a healthy diet. Foods such as vegetables, fruits, whole grains, low-fat dairy products, and lean protein foods contain the nutrients you need without too many calories. Decrease your intake of foods high in solid fats, added sugars, and salt. Eat the right amount of calories for you.Get information about a proper diet from your health care provider, if necessary.  Regular physical exercise is one of the most important things you can do for your health. Most adults should get at least 150 minutes of moderate-intensity exercise (any activity that increases your heart rate and causes you to sweat) each week. In addition, most adults need muscle-strengthening exercises on 2 or more days a week.  Maintain a healthy weight. The body mass index (BMI) is a screening tool to identify possible weight problems. It provides an estimate of body fat based on height and weight. Your health care provider can find your BMI and can help you achieve or maintain a healthy weight.For adults 20 years and older:  A BMI below 18.5 is considered underweight.  A BMI of 18.5 to 24.9 is normal.  A BMI of 25 to 29.9 is considered  overweight.  A BMI of 30 and above is considered obese.  Maintain normal blood lipids and cholesterol levels by exercising and minimizing your intake of saturated fat. Eat a balanced diet with plenty of fruit and vegetables. Blood tests for lipids and cholesterol should begin at age 42 and be repeated every 5 years. If your lipid or cholesterol levels are high, you are over 50, or you are at high risk for heart disease, you may need your cholesterol levels checked more frequently.Ongoing high lipid and cholesterol levels should be treated with medicines if diet and exercise are not working.  If you smoke, find out from your health care provider how to quit. If you do not use tobacco, do not start.  Lung cancer screening is recommended for adults aged 24 80 years who are at high risk for developing lung cancer because of a history of smoking. A yearly low-dose CT scan of the lungs is recommended for people who have at least a 30-pack-year history of smoking and are a current smoker or have quit within the past 15 years. A pack year of smoking is smoking an average of 1 pack of cigarettes a day for 1 year (for example: 1 pack a day for 30 years or 2 packs a day for 15 years). Yearly screening should continue until the smoker has stopped smoking for at least 15 years. Yearly screening should be stopped for people who develop a health problem that would prevent them from having lung cancer treatment.  If you choose to drink alcohol, do not have  more than 2 drinks per day. One drink is considered to be 12 ounces (355 mL) of beer, 5 ounces (148 mL) of wine, or 1.5 ounces (44 mL) of liquor.  Avoid use of street drugs. Do not share needles with anyone. Ask for help if you need support or instructions about stopping the use of drugs.  High blood pressure causes heart disease and increases the risk of stroke. Your blood pressure should be checked at least every 1 2 years. Ongoing high blood pressure should be  treated with medicines, if weight loss and exercise are not effective.  If you are 75 58 years old, ask your health care provider if you should take aspirin to prevent heart disease.  Diabetes screening involves taking a blood sample to check your fasting blood sugar level. This should be done once every 3 years, after age 19, if you are within normal weight and without risk factors for diabetes. Testing should be considered at a younger age or be carried out more frequently if you are overweight and have at least 1 risk factor for diabetes.  Colorectal cancer can be detected and often prevented. Most routine colorectal cancer screening begins at the age of 47 and continues through age 80. However, your health care provider may recommend screening at an earlier age if you have risk factors for colon cancer. On a yearly basis, your health care provider may provide home test kits to check for hidden blood in the stool. Use of a small camera at the end of a tube to directly examine the colon (sigmoidoscopy or colonoscopy) can detect the earliest forms of colorectal cancer. Talk to your health care provider about this at age 66, when routine screening begins. Direct exam of the colon should be repeated every 5 10 years through age 19, unless early forms of precancerous polyps or small growths are found.  People who are at an increased risk for hepatitis B should be screened for this virus. You are considered at high risk for hepatitis B if:  You were born in a country where hepatitis B occurs often. Talk with your health care provider about which countries are considered high-risk.  Your parents were born in a high-risk country and you have not received a shot to protect against hepatitis B (hepatitis B vaccine).  You have HIV or AIDS.  You use needles to inject street drugs.  You live with, or have sex with, someone who has hepatitis B.  You are a man who has sex with other men (MSM).  You get  hemodialysis treatment.  You take certain medicines for conditions such as cancer, organ transplantation, and autoimmune conditions.  Hepatitis C blood testing is recommended for all people born from [birth year] through 1965 and any individual with known risks for hepatitis C.  Practice safe sex. Use condoms and avoid high-risk sexual practices to reduce the spread of sexually transmitted infections (STIs). STIs include gonorrhea, chlamydia, syphilis, trichomonas, herpes, HPV, and human immunodeficiency virus (HIV). Herpes, HIV, and HPV are viral illnesses that have no cure. They can result in disability, cancer, and death.  A one-time screening for abdominal aortic aneurysm (AAA) and surgical repair of large AAAs by ultrasound are recommended for men ages 94 to 74 years who are current or former smokers.  Healthy men should no longer receive prostate-specific antigen (PSA) blood tests as part of routine cancer screening. Talk with your health care provider about prostate cancer screening.  Testicular cancer screening is not recommended  for adult males who have no symptoms. Screening includes self-exam, a health care provider exam, and other screening tests. Consult with your health care provider about any symptoms you have or any concerns you have about testicular cancer.  Use sunscreen. Apply sunscreen liberally and repeatedly throughout the day. You should seek shade when your shadow is shorter than you. Protect yourself by wearing long sleeves, pants, a wide-brimmed hat, and sunglasses year round, whenever you are outdoors.  Once a month, do a whole-body skin exam, using a mirror to look at the skin on your back. Tell your health care provider about new moles, moles that have irregular borders, moles that are larger than a pencil eraser, or moles that have changed in shape or color.  Stay current with required vaccines (immunizations).  Influenza vaccine. All adults should be immunized every  year.  Tetanus, diphtheria, and acellular pertussis (Td, Tdap) vaccine. An adult who has not previously received Tdap or who does not know his vaccine status should receive 1 dose of Tdap. This initial dose should be followed by tetanus and diphtheria toxoids (Td) booster doses every 10 years. Adults with an unknown or incomplete history of completing a 3-dose immunization series with Td-containing vaccines should begin or complete a primary immunization series including a Tdap dose. Adults should receive a Td booster every 10 years.  Varicella vaccine. An adult without evidence of immunity to varicella should receive 2 doses or a second dose if he has previously received 1 dose.  Human papillomavirus (HPV) vaccine. Males aged 44 21 years who have not received the vaccine previously should receive the 3-dose series. Males aged 43 26 years may be immunized. Immunization is recommended through the age of 50 years for any male who has sex with males and did not get any or all doses earlier. Immunization is recommended for any person with an immunocompromised condition through the age of 23 years if he did not get any or all doses earlier. During the 3-dose series, the second dose should be obtained 4 8 weeks after the first dose. The third dose should be obtained 24 weeks after the first dose and 16 weeks after the second dose.  Zoster vaccine. One dose is recommended for adults aged 96 years or older unless certain conditions are present.  Measles, mumps, and rubella (MMR) vaccine. Adults born before 55 generally are considered immune to measles and mumps. Adults born in [birth year] or later should have 1 or more doses of MMR vaccine unless there is a contraindication to the vaccine or there is laboratory evidence of immunity to each of the three diseases. A routine second dose of MMR vaccine should be obtained at least 28 days after the first dose for students attending postsecondary schools, health care  workers, or international travelers. People who received inactivated measles vaccine or an unknown type of measles vaccine during 1963 1967 should receive 2 doses of MMR vaccine. People who received inactivated mumps vaccine or an unknown type of mumps vaccine before 1979 and are at high risk for mumps infection should consider immunization with 2 doses of MMR vaccine. Unvaccinated health care workers born before [birth year] who lack laboratory evidence of measles, mumps, or rubella immunity or laboratory confirmation of disease should consider measles and mumps immunization with 2 doses of MMR vaccine or rubella immunization with 1 dose of MMR vaccine.  Pneumococcal 13-valent conjugate (PCV13) vaccine. When indicated, a person who is uncertain of his immunization history and has no record of immunization  should receive the PCV13 vaccine. An adult aged 67 years or older who has certain medical conditions and has not been previously immunized should receive 1 dose of PCV13 vaccine. This PCV13 should be followed with a dose of pneumococcal polysaccharide (PPSV23) vaccine. The PPSV23 vaccine dose should be obtained at least 8 weeks after the dose of PCV13 vaccine. An adult aged 79 years or older who has certain medical conditions and previously received 1 or more doses of PPSV23 vaccine should receive 1 dose of PCV13. The PCV13 vaccine dose should be obtained 1 or more years after the last PPSV23 vaccine dose.  Pneumococcal polysaccharide (PPSV23) vaccine. When PCV13 is also indicated, PCV13 should be obtained first. All adults aged 74 years and older should be immunized. An adult younger than age 50 years who has certain medical conditions should be immunized. Any person who resides in a nursing home or long-term care facility should be immunized. An adult smoker should be immunized. People with an immunocompromised condition and certain other conditions should receive both PCV13 and PPSV23 vaccines. People with human  immunodeficiency virus (HIV) infection should be immunized as soon as possible after diagnosis. Immunization during chemotherapy or radiation therapy should be avoided. Routine use of PPSV23 vaccine is not recommended for American Indians, [place] Natives, or people younger than 65 years unless there are medical conditions that require PPSV23 vaccine. When indicated, people who have unknown immunization and have no record of immunization should receive PPSV23 vaccine. One-time revaccination 5 years after the first dose of PPSV23 is recommended for people aged 41 64 years who have chronic kidney failure, nephrotic syndrome, asplenia, or immunocompromised conditions. People who received 1 2 doses of PPSV23 before age 15 years should receive another dose of PPSV23 vaccine at age 48 years or later if at least 5 years have passed since the previous dose. Doses of PPSV23 are not needed for people immunized with PPSV23 at or after age 69 years.  Meningococcal vaccine. Adults with asplenia or persistent complement component deficiencies should receive 2 doses of quadrivalent meningococcal conjugate (MenACWY-D) vaccine. The doses should be obtained at least 2 months apart. Microbiologists working with certain meningococcal bacteria, [place] recruits, people at risk during an outbreak, and people who travel to or live in countries with a high rate of meningitis should be immunized. A first-year college student up through age 7 years who is living in a residence hall should receive a dose if he did not receive a dose on or after his 16th birthday. Adults who have certain high-risk conditions should receive one or more doses of vaccine.  Hepatitis A vaccine. Adults who wish to be protected from this disease, have certain high-risk conditions, work with hepatitis A-infected animals, work in hepatitis A research labs, or travel to or work in countries with a high rate of hepatitis A should be immunized. Adults who were  previously unvaccinated and who anticipate close contact with an international adoptee during the first 60 days after arrival in the [place] from a country with a high rate of hepatitis A should be immunized.  Hepatitis B vaccine. Adults who wish to be protected from this disease, have certain high-risk conditions, may be exposed to blood or other infectious body fluids, are household contacts or sex partners of hepatitis B positive people, are clients or workers in certain care facilities, or travel to or work in countries with a high rate of hepatitis B should be immunized.  Haemophilus influenzae type b (Hib) vaccine. A  previously unvaccinated person with asplenia or sickle cell disease or having a scheduled splenectomy should receive 1 dose of Hib vaccine. Regardless of previous immunization, a recipient of a hematopoietic stem cell transplant should receive a 3-dose series 6 12 months after his successful transplant. Hib vaccine is not recommended for adults with HIV infection. Preventive Service / Frequency Ages 62 to 3  Blood pressure check.** / Every 1 to 2 years.  Lipid and cholesterol check.** / Every 5 years beginning at age 43.  Hepatitis C blood test.** / For any individual with known risks for hepatitis C.  Skin self-exam. / Monthly.  Influenza vaccine. / Every year.  Tetanus, diphtheria, and acellular pertussis (Tdap, Td) vaccine.** / Consult your health care provider. 1 dose of Td every 10 years.  Varicella vaccine.** / Consult your health care provider.  HPV vaccine. / 3 doses over 6 months, if 48 or younger.  Measles, mumps, rubella (MMR) vaccine.** / You need at least 1 dose of MMR if you were born in 1957 or later. You may also need a second dose.  Pneumococcal 13-valent conjugate (PCV13) vaccine.** / Consult your health care provider.  Pneumococcal polysaccharide (PPSV23) vaccine.** / 1 to 2 doses if you smoke cigarettes or if you have certain  conditions.  Meningococcal vaccine.** / 1 dose if you are age 8 to 70 years and a Market researcher living in a residence hall, or have one of several medical conditions. You may also need additional booster doses.  Hepatitis A vaccine.** / Consult your health care provider.  Hepatitis B vaccine.** / Consult your health care provider.  Haemophilus influenzae type b (Hib) vaccine.** / Consult your health care provider. Ages 48 to 32  Blood pressure check.** / Every 1 to 2 years.  Lipid and cholesterol check.** / Every 5 years beginning at age 38.  Lung cancer screening. / Every year if you are aged 40 80 years and have a 30-pack-year history of smoking and currently smoke or have quit within the past 15 years. Yearly screening is stopped once you have quit smoking for at least 15 years or develop a health problem that would prevent you from having lung cancer treatment.  Fecal occult blood test (FOBT) of stool. / Every year beginning at age 4 and continuing until age 70. You may not have to do this test if you get a colonoscopy every 10 years.  Flexible sigmoidoscopy** or colonoscopy.** / Every 5 years for a flexible sigmoidoscopy or every 10 years for a colonoscopy beginning at age 76 and continuing until age 62.  Hepatitis C blood test.** / For all people born from [birth year] through 1965 and any individual with known risks for hepatitis C.  Skin self-exam. / Monthly.  Influenza vaccine. / Every year.  Tetanus, diphtheria, and acellular pertussis (Tdap/Td) vaccine.** / Consult your health care provider. 1 dose of Td every 10 years.  Varicella vaccine.** / Consult your health care provider.  Zoster vaccine.** / 1 dose for adults aged 60 years or older.  Measles, mumps, rubella (MMR) vaccine.** / You need at least 1 dose of MMR if you were born in 1957 or later. You may also need a second dose.  Pneumococcal 13-valent conjugate (PCV13) vaccine.** / Consult your health care  provider.  Pneumococcal polysaccharide (PPSV23) vaccine.** / 1 to 2 doses if you smoke cigarettes or if you have certain conditions.  Meningococcal vaccine.** / Consult your health care provider.  Hepatitis A vaccine.** / Consult your health care  provider.  Hepatitis B vaccine.** / Consult your health care provider.  Haemophilus influenzae type b (Hib) vaccine.** / Consult your health care provider. Ages 65 and over  Blood pressure check.** / Every 1 to 2 years.  Lipid and cholesterol check.**/ Every 5 years beginning at age 20.  Lung cancer screening. / Every year if you are aged 55 80 years and have a 30-pack-year history of smoking and currently smoke or have quit within the past 15 years. Yearly screening is stopped once you have quit smoking for at least 15 years or develop a health problem that would prevent you from having lung cancer treatment.  Fecal occult blood test (FOBT) of stool. / Every year beginning at age 50 and continuing until age 75. You may not have to do this test if you get a colonoscopy every 10 years.  Flexible sigmoidoscopy** or colonoscopy.** / Every 5 years for a flexible sigmoidoscopy or every 10 years for a colonoscopy beginning at age 50 and continuing until age 75.  Hepatitis C blood test.** / For all people born from 1945 through 1965 and any individual with known risks for hepatitis C.  Abdominal aortic aneurysm (AAA) screening.** / A one-time screening for ages 65 to 75 years who are current or former smokers.  Skin self-exam. / Monthly.  Influenza vaccine. / Every year.  Tetanus, diphtheria, and acellular pertussis (Tdap/Td) vaccine.** / 1 dose of Td every 10 years.  Varicella vaccine.** / Consult your health care provider.  Zoster vaccine.** / 1 dose for adults aged 60 years or older.  Pneumococcal 13-valent conjugate (PCV13) vaccine.** / Consult your health care provider.  Pneumococcal polysaccharide (PPSV23) vaccine.** / 1 dose for all  adults aged 65 years and older.  Meningococcal vaccine.** / Consult your health care provider.  Hepatitis A vaccine.** / Consult your health care provider.  Hepatitis B vaccine.** / Consult your health care provider.  Haemophilus influenzae type b (Hib) vaccine.** / Consult your health care provider. **Family history and personal history of risk and conditions may change your health care provider's recommendations. Document Released: 04/14/2001 Document Revised: 12/07/2012 Document Reviewed: 07/14/2010 [employer] Patient Information 2014 [employer].  

## 2013-05-22 NOTE — Assessment & Plan Note (Addendum)
Advised to improve conditioning, however, gait may be a factor and will await results before asking him do more.

## 2013-05-22 NOTE — Progress Notes (Signed)
    Subjective:    Patient ID: Shaun Lutz is a 58 y.o. male presenting with excessive shaking and sleeping a lot  on 05/22/2013  HPI: Reports laying in bed frequently.  Not getting up out of bed. Has had trouble with gait.  He is able to get around his home right now, holding onto objects.  This has really been in the last 6 weeks. Previously walked 5-10 miles daily.  No energy.  Weight is up 3 #. Needs med refill.  Review of Systems  Constitutional: Positive for fatigue. Negative for fever and chills.  HENT: Positive for hearing loss. Negative for rhinorrhea and sneezing.   Eyes: Negative for visual disturbance.  Respiratory: Negative for cough.   Cardiovascular: Positive for leg swelling. Negative for chest pain.  Gastrointestinal: Negative for nausea, vomiting and abdominal pain.  Musculoskeletal: Negative for back pain and joint swelling.  Skin: Positive for rash.  Neurological: Positive for dizziness, tremors and weakness. Negative for syncope and headaches.  Psychiatric/Behavioral: Negative for dysphoric mood. The patient is not nervous/anxious.       Objective:    BP 132/89  Pulse 81  Temp(Src) 98.3 F (36.8 C) (Oral)  Ht 5\' 8"  (1.727 m)  Wt 119 lb (53.978 kg)  BMI 18.10 kg/m2 Physical Exam  Vitals reviewed. Constitutional: He is oriented to person, place, and time. He appears cachectic. No distress.  HENT:  Head: Normocephalic and atraumatic.  Eyes: Conjunctivae are normal. No scleral icterus.  Neck: Neck supple.  Cardiovascular: Normal rate and regular rhythm.   Pulmonary/Chest: Effort normal and breath sounds normal. He has no wheezes.  Abdominal: Soft. He exhibits no mass. There is no tenderness.  Musculoskeletal: Normal range of motion. He exhibits edema.  Neurological: He is alert and oriented to person, place, and time. He displays tremor. He displays normal reflexes. Gait abnormal.  Gait is broad based and shuffling.  There is minimal resting tremor, more  with intent Finger to nose is ok  Skin: Skin is warm and dry. No rash noted.  Psychiatric: He has a normal mood and affect. His behavior is normal.        Assessment & Plan:   Abnormality of gait Image the brain (cannot get MRI secondary to pace maker), PT referral--check labs. Walker for home use.  Underweight Continue Ensure, check TSH  Physical deconditioning Advised to improve conditioning, however, gait may be a factor and will await results before asking him do more.    Return in about 6 months (around 11/22/2013).

## 2013-05-22 NOTE — Assessment & Plan Note (Addendum)
Image the brain (cannot get MRI secondary to pace maker), PT referral--check labs. Walker for home use.

## 2013-05-23 ENCOUNTER — Telehealth: Payer: Self-pay | Admitting: *Deleted

## 2013-05-23 LAB — TSH: TSH: 3.417 u[IU]/mL (ref 0.350–4.500)

## 2013-05-23 NOTE — Telephone Encounter (Signed)
Dr. Kennon Rounds- the DME company should be calling you to get the order.  Newman Pies, MSW, [place]

## 2013-05-23 NOTE — Telephone Encounter (Signed)
Norma--had an emergency--call me on [phone] when you get an answer.

## 2013-05-23 NOTE — Telephone Encounter (Signed)
Pt contacted to determine which [facility] he preferred as [facility] is currently unable to accept referrals from the community. Significant other informed CSW that pt is hard of hearing therefore she would take call. S.O stated no preference for a [facility] agency is preferred and that pt is not in need of an Aide however is requesting for PCP to order a walker with a bench as pt is having a hard time ambulating/getting out of breath. CSW informed S.O that CSW would make PCP aware of request.  CSW with fax referral to agency once order is placed. Hunt Oris, MSW, [place]

## 2013-05-24 ENCOUNTER — Encounter: Payer: Medicare Other | Admitting: *Deleted

## 2013-05-24 DIAGNOSIS — R269 Unspecified abnormalities of gait and mobility: Secondary | ICD-10-CM | POA: Diagnosis not present

## 2013-05-24 DIAGNOSIS — IMO0001 Reserved for inherently not codable concepts without codable children: Secondary | ICD-10-CM | POA: Diagnosis not present

## 2013-05-24 DIAGNOSIS — I69998 Other sequelae following unspecified cerebrovascular disease: Secondary | ICD-10-CM | POA: Diagnosis not present

## 2013-05-24 DIAGNOSIS — I10 Essential (primary) hypertension: Secondary | ICD-10-CM | POA: Diagnosis not present

## 2013-05-24 DIAGNOSIS — G250 Essential tremor: Secondary | ICD-10-CM | POA: Diagnosis not present

## 2013-05-24 DIAGNOSIS — R42 Dizziness and giddiness: Secondary | ICD-10-CM | POA: Diagnosis not present

## 2013-05-24 DIAGNOSIS — Z9181 History of falling: Secondary | ICD-10-CM | POA: Diagnosis not present

## 2013-05-25 ENCOUNTER — Other Ambulatory Visit: Payer: Self-pay | Admitting: Family Medicine

## 2013-05-25 ENCOUNTER — Encounter (HOSPITAL_COMMUNITY): Payer: Self-pay

## 2013-05-25 ENCOUNTER — Telehealth: Payer: Self-pay | Admitting: Internal Medicine

## 2013-05-25 ENCOUNTER — Ambulatory Visit (HOSPITAL_COMMUNITY)
Admission: RE | Admit: 2013-05-25 | Discharge: 2013-05-25 | Disposition: A | Discharge status: Home / Self Care | Payer: Medicare Other | Source: Ambulatory Visit | Attending: Family Medicine | Admitting: Family Medicine

## 2013-05-25 DIAGNOSIS — R42 Dizziness and giddiness: Secondary | ICD-10-CM | POA: Diagnosis not present

## 2013-05-25 DIAGNOSIS — R269 Unspecified abnormalities of gait and mobility: Secondary | ICD-10-CM

## 2013-05-25 NOTE — Addendum Note (Signed)
Addended by: Donnamae Jude on: 05/25/2013 01:45 PM   Modules accepted: Orders

## 2013-05-25 NOTE — Telephone Encounter (Signed)
800# given 

## 2013-05-25 NOTE — Progress Notes (Signed)
  Ct results reviewed--they want MRI--cannot have secondary to implantable pace maker--? NPH as cause for gait anomaly will refer for further w/u.

## 2013-05-25 NOTE — Telephone Encounter (Signed)
New Message:  Pt is requesting a call back from one of the device techs... States there were issues sending in a transmission.

## 2013-05-29 ENCOUNTER — Telehealth: Payer: Self-pay | Admitting: Family Medicine

## 2013-05-29 NOTE — Telephone Encounter (Signed)
Pt called and would like the results of his scans that where done. jw

## 2013-05-29 NOTE — Telephone Encounter (Signed)
Will forward to MD for results.  Jazmin Hartsell,CMA  

## 2013-06-05 ENCOUNTER — Ambulatory Visit (INDEPENDENT_AMBULATORY_CARE_PROVIDER_SITE_OTHER): Payer: Medicare Other | Admitting: Neurology

## 2013-06-05 ENCOUNTER — Encounter: Payer: Self-pay | Admitting: Neurology

## 2013-06-05 ENCOUNTER — Telehealth: Payer: Self-pay | Admitting: Neurology

## 2013-06-05 VITALS — BP 130/82 | HR 84 | Resp 18 | Ht 68.0 in | Wt 117.0 lb

## 2013-06-05 DIAGNOSIS — G20 Parkinson's disease: Secondary | ICD-10-CM

## 2013-06-05 DIAGNOSIS — R7309 Other abnormal glucose: Secondary | ICD-10-CM | POA: Diagnosis not present

## 2013-06-05 DIAGNOSIS — E871 Hypo-osmolality and hyponatremia: Secondary | ICD-10-CM | POA: Diagnosis not present

## 2013-06-05 DIAGNOSIS — R413 Other amnesia: Secondary | ICD-10-CM | POA: Diagnosis not present

## 2013-06-05 DIAGNOSIS — R739 Hyperglycemia, unspecified: Secondary | ICD-10-CM

## 2013-06-05 DIAGNOSIS — R930 Abnormal findings on diagnostic imaging of skull and head, not elsewhere classified: Secondary | ICD-10-CM

## 2013-06-05 DIAGNOSIS — R42 Dizziness and giddiness: Secondary | ICD-10-CM

## 2013-06-05 LAB — RPR: RPR Ser Ql: REACTIVE — AB

## 2013-06-05 LAB — RPR TITER

## 2013-06-05 LAB — SODIUM: Sodium: 133 mEq/L — ABNORMAL LOW (ref 135–145)

## 2013-06-05 LAB — HEMOGLOBIN A1C
Hgb A1c MFr Bld: 6 % — ABNORMAL HIGH (ref <5.7)
MEAN PLASMA GLUCOSE: 126 mg/dL — AB (ref <117)

## 2013-06-05 LAB — FOLATE: Folate: >20 ng/mL

## 2013-06-05 LAB — VITAMIN B12: Vitamin B-12: 735 pg/mL (ref 211–911)

## 2013-06-05 MED ORDER — CARBIDOPA-LEVODOPA 25-100 MG PO TABS: 1.0000 | ORAL_TABLET | Freq: Three times a day (TID) | ORAL | Status: DC

## 2013-06-05 NOTE — Patient Instructions (Addendum)
1. We have scheduled you at [facility] for your CT Head with contrast on 06/07/2013 at 9:00 am. Please arrive 15 minutes prior and go to 1st floor radiology. If you need to reschedule for any reason please call [phone]. 2. Your provider has requested that you have labwork completed today. Please go to [facility] on the first floor of this building before leaving the office today. 3. Begin Carbidopa Levodopa as follows: 1/2 tab three times a day before meals x 1 wk, then 1/2 in am & noon & 1 in evening for a week, then 1/2 in am &1 at noon &one in evening for a week, then 1 tablet three times a day before meals 4. We will contact [facility] about adding occupational and speech therapy. 5. STOP SMOKING 6. Follow up in 8 weeks

## 2013-06-05 NOTE — Telephone Encounter (Signed)
[facility] home health referral sent to add Occupational and Speech therapy to patient's physical therapy. Faxed to [phone] with confirmation received.

## 2013-06-05 NOTE — Progress Notes (Signed)
Shaun Lutz was seen today in the movement disorders clinic for neurologic consultation at the request of PRATT,TANYA S, MD.  The consultation is for the evaluation of gait changes, tremor and to rule out NPH.  The pt is accompanied by his accompanied by his long term girlfriend of 16 years who supplements the history.  The pts girlfriend states that initially there was a balance problem prior to the PPM but that was more of passing out problem which resolved with the pacemaker.  However, over the last year they have noted increasing weakness in the legs and when the legs are weak, there is a tremor in the hands and legs.      Specific Symptoms:  Tremor: yes (hands and legs, when he is up and walking) Voice: no changes Sleep: sleeps all the time per his girlfriend  Vivid Dreams:  no  Acting out dreams:  no Wet Pillows: yes Postural symptoms:  yes  Falls?  no (near falls) Bradykinesia symptoms: slow movements and difficulty getting out of a chair Loss of smell:  no Loss of taste:  no Urinary Incontinence:  no Difficulty Swallowing:  no Handwriting, micrographia: no (always been small and scribbly per pt) Trouble with ADL's:  no but takes longer than in the past  Trouble buttoning clothing: no Depression:  yes (primarily because out of work; out of work after was passing out prior to [employer]) Memory changes:  yes (comes and goes; girlfriend takes care of monthly medications and pills and cooks; neither girlfriend nor patient drives) Hallucinations:  no  visual distortions: yes N/V:  no Lightheaded:  yes  Syncope: no (not since PPM) Diplopia:  no Dyskinesia:  no  Neuroimaging has previously been performed.  It  available for my review today.  The patient did have a CT of the brain that I did review.  This was done on 05/25/2013.  This revealed evidence of small vessel disease.  There was very mild ventricular prominence.  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  The  patient is unable to have an MRI of the brain because of a permanent pacemaker.  PREVIOUS MEDICATIONS: none to date  ALLERGIES:  No Known Allergies  CURRENT MEDICATIONS:  Current Outpatient Prescriptions on File Prior to Visit  Medication Sig Dispense Refill  . aspirin 81 MG tablet Take 1 tablet (81 mg total) by mouth daily.  180 tablet  2  . atenolol (TENORMIN) 50 MG tablet Take one tablet by mouth once daily  90 tablet  3  . lisinopril (PRINIVIL,ZESTRIL) 5 MG tablet Take 1 tablet (5 mg total) by mouth daily.  90 tablet  3  . Nutritional Supplements (ENSURE ACTIVE HIGH PROTEIN) LIQD Take 1 Can by mouth 3 (three) times daily.  60 Can  2   No current facility-administered medications on file prior to visit.    PAST MEDICAL HISTORY:   Past Medical History  Diagnosis Date  . Hypertension   . Second degree AV block   . Syncope   . Pacemaker -Medtronic     DOI 2012    PAST SURGICAL HISTORY:   Past Surgical History  Procedure Laterality Date  . Pacemaker insertion      SOCIAL HISTORY:   History   Social History  . Marital Status: Divorced    Spouse Name: N/A    Number of Children: N/A  . Years of Education: N/A   Occupational History  . Not on file.   Social History  Main Topics  . Smoking status: Former Smoker -- 0.25 packs/day for 15 years    Types: Cigarettes    Quit date: 07/27/2012  . Smokeless tobacco: Not on file  . Alcohol Use: No  . Drug Use: No  . Sexual Activity: Not on file   Other Topics Concern  . Not on file   Social History Narrative  . No narrative on file    FAMILY HISTORY:   Family Status  Relation Status Death Age  . Mother Deceased     breast cancer  . Father Deceased     heart disease  . Brother Deceased     heart disease  . Sister Deceased     embolism  . Son Alive     healthy  . Daughter Alive     healthy    ROS:  A complete 10 system review of systems was obtained and was unremarkable apart from what is mentioned  above.  PHYSICAL EXAMINATION:    VITALS:   Filed Vitals:   06/05/13 0854  BP: 130/82  Pulse: 84  Resp: 18  Height: 5\' 8"  (1.727 m)  Weight: 117 lb (53.071 kg)    GEN:  The patient appears older than age and is in NAD. HEENT:  Normocephalic, atraumatic.  The mucous membranes are moist. The superficial temporal arteries are without ropiness or tenderness. CV:  RRR Lungs:  CTAB Neck/HEME:  There are no carotid bruits bilaterally.  Neurological examination:  Orientation: The patient is alert and oriented to month/date but not year.  Scored 19/30 on MMSE on 06/05/13. Cranial nerves: There is good facial symmetry but there is facial hypomimia. Pupils are equal round and reactive to light bilaterally. Fundoscopic exam is attempted but the disc margins are not well visualized bilaterally.  Extraocular muscles are intact. The visual fields are full to confrontational testing. The speech is fluent and mildy dysarthric (mostly because of poor dentition). Soft palate rises symmetrically and there is no tongue deviation. Hearing is markedly decreased to conversational tone. Sensation: Sensation is intact to light and pinprick throughout (facial, trunk, extremities). Vibration is intact at the bilateral big toe. There is no extinction with double simultaneous stimulation. There is no sensory dermatomal level identified. Motor: Strength is 5/5 in the bilateral upper and lower extremities.   Shoulder shrug is equal and symmetric.  There is no pronator drift. Deep tendon reflexes: Deep tendon reflexes are 2/4 at the bilateral biceps, triceps, brachioradialis, 2- at the bilateral patella and trace at the bilateral achilles. Plantar responses are downgoing bilaterally.  Movement examination: Tone: There is increased tone in the left upper extremity, overall moderate.  There is normal tone in the right upper and bilateral lower extremities.  Abnormal movements: There is a rare right upper extremity resting  tremor, only present after the patient ambulates and sits back down. Coordination:  There is decremation with RAM's, seen bilaterally Gait and Station: The patient has no significant difficulty arising out of a deep-seated chair without the use of the hands. The patient's stride length is decreased but has a wide-based gait.  The patient has a positive pull test.      LABS:  Lab Results  Component Value Date   TSH 3.417 05/22/2013   Lab Results  Component Value Date   WBC 5.2 05/22/2013   HGB 12.7* 05/22/2013   HCT 37.1* 05/22/2013   MCV 87.1 05/22/2013   PLT 258 05/22/2013     Chemistry      Component  Value Date/Time   NA 132* 05/22/2013 1548   K 4.6 05/22/2013 1548   CL 97 05/22/2013 1548   CO2 32 05/22/2013 1548   BUN 12 05/22/2013 1548   CREATININE 0.69 05/22/2013 1548   CREATININE 0.80 04/09/2012 2145      Component Value Date/Time   CALCIUM 8.7 05/22/2013 1548   ALKPHOS 65 05/22/2013 1548   AST 16 05/22/2013 1548   ALT 17 05/22/2013 1548   BILITOT 0.3 05/22/2013 1548       ASSESSMENT/PLAN:  1.  Parkinsonism.  I suspect that this does represent idiopathic Parkinson's disease.  The patient has tremor, bradykinesia, rigidity and mild postural instability.  -I do not think that this is NPH.  I do think that the PD has been going on for a while and he has some degree of cognitive impairment associated with PD.  He does not drive or do medications for himself.  -We discussed the diagnosis as well as pathophysiology of the disease.  We discussed treatment options as well as prognostic indicators.  Patient education was provided.  -Greater than 50% of the 80 minute visit was spent in counseling answering questions and talking about what to expect now as well as in the future.    We talked about safety in the home.  -We decided to add carbidopa/levodopa 25/100.  1/2 tab tid x 1 wk, then 1/2 in am & noon & 1 at night for a week, then 1/2 in am &1 at noon &night for a week, then 1 po tid.   Risks, benefits, side effects and alternative therapies were discussed.  The opportunity to ask questions was given and they were answered to the best of my ability.  The patient expressed understanding and willingness to follow the outlined treatment protocols.  -He is currently doing physical therapy associated with [facility]  I am going to add OT/ST to that.    -I am going to send him back for a CT with contrast.  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  He cannot have an MRI because of a pacemaker.  -He was given information on the local support group.  -We did lab work including B12 (memory change), RPR, repeat Na (hyponatremia), hgbA1C (hyperglycemia)  -f/u in 8 weeks

## 2013-06-06 LAB — HIV ANTIBODY (ROUTINE TESTING W REFLEX): HIV: REACTIVE — AB

## 2013-06-06 LAB — HIV 1/2 CONFIRMATION
HIV 1 ANTIBODY: POSITIVE — AB
HIV 2 AB: NEGATIVE

## 2013-06-06 LAB — T.PALLIDUM AB, TOTAL: T pallidum Antibodies (TP-PA): >8 S/CO — ABNORMAL HIGH (ref <0.90)

## 2013-06-07 ENCOUNTER — Ambulatory Visit (HOSPITAL_COMMUNITY): Payer: Medicare Other

## 2013-06-07 ENCOUNTER — Encounter: Payer: Self-pay | Admitting: Family Medicine

## 2013-06-07 ENCOUNTER — Encounter (HOSPITAL_COMMUNITY): Payer: Self-pay | Admitting: Emergency Medicine

## 2013-06-07 ENCOUNTER — Emergency Department (HOSPITAL_COMMUNITY): Payer: Medicare Other

## 2013-06-07 ENCOUNTER — Ambulatory Visit (INDEPENDENT_AMBULATORY_CARE_PROVIDER_SITE_OTHER): Payer: Medicare Other | Admitting: Neurology

## 2013-06-07 ENCOUNTER — Inpatient Hospital Stay (HOSPITAL_COMMUNITY)
Admission: EM | Admit: 2013-06-07 | Discharge: 2013-06-21 | DRG: 975 | Disposition: A | Discharge status: Other Acute Inpatient Hospital | Payer: Medicare Other | Attending: Family Medicine | Admitting: Family Medicine

## 2013-06-07 ENCOUNTER — Encounter: Payer: Self-pay | Admitting: Neurology

## 2013-06-07 VITALS — BP 98/66 | HR 60 | Resp 16 | Ht 68.0 in | Wt 115.7 lb

## 2013-06-07 DIAGNOSIS — R0609 Other forms of dyspnea: Secondary | ICD-10-CM | POA: Diagnosis not present

## 2013-06-07 DIAGNOSIS — A539 Syphilis, unspecified: Secondary | ICD-10-CM | POA: Insufficient documentation

## 2013-06-07 DIAGNOSIS — F3289 Other specified depressive episodes: Secondary | ICD-10-CM | POA: Diagnosis present

## 2013-06-07 DIAGNOSIS — B0111 Varicella encephalitis and encephalomyelitis: Secondary | ICD-10-CM

## 2013-06-07 DIAGNOSIS — B029 Zoster without complications: Secondary | ICD-10-CM | POA: Diagnosis present

## 2013-06-07 DIAGNOSIS — F329 Major depressive disorder, single episode, unspecified: Secondary | ICD-10-CM | POA: Diagnosis present

## 2013-06-07 DIAGNOSIS — H9190 Unspecified hearing loss, unspecified ear: Secondary | ICD-10-CM | POA: Diagnosis present

## 2013-06-07 DIAGNOSIS — R279 Unspecified lack of coordination: Secondary | ICD-10-CM | POA: Diagnosis not present

## 2013-06-07 DIAGNOSIS — E440 Moderate protein-calorie malnutrition: Secondary | ICD-10-CM | POA: Diagnosis not present

## 2013-06-07 DIAGNOSIS — F410 Panic disorder [episodic paroxysmal anxiety] without agoraphobia: Secondary | ICD-10-CM | POA: Diagnosis not present

## 2013-06-07 DIAGNOSIS — Z7982 Long term (current) use of aspirin: Secondary | ICD-10-CM | POA: Diagnosis not present

## 2013-06-07 DIAGNOSIS — G3184 Mild cognitive impairment, so stated: Secondary | ICD-10-CM | POA: Diagnosis not present

## 2013-06-07 DIAGNOSIS — I10 Essential (primary) hypertension: Secondary | ICD-10-CM | POA: Diagnosis not present

## 2013-06-07 DIAGNOSIS — H9193 Unspecified hearing loss, bilateral: Secondary | ICD-10-CM

## 2013-06-07 DIAGNOSIS — I498 Other specified cardiac arrhythmias: Secondary | ICD-10-CM | POA: Diagnosis not present

## 2013-06-07 DIAGNOSIS — R29898 Other symptoms and signs involving the musculoskeletal system: Secondary | ICD-10-CM | POA: Diagnosis not present

## 2013-06-07 DIAGNOSIS — L80 Vitiligo: Secondary | ICD-10-CM | POA: Diagnosis present

## 2013-06-07 DIAGNOSIS — R5381 Other malaise: Secondary | ICD-10-CM

## 2013-06-07 DIAGNOSIS — I959 Hypotension, unspecified: Secondary | ICD-10-CM

## 2013-06-07 DIAGNOSIS — R45851 Suicidal ideations: Secondary | ICD-10-CM

## 2013-06-07 DIAGNOSIS — L509 Urticaria, unspecified: Secondary | ICD-10-CM | POA: Diagnosis not present

## 2013-06-07 DIAGNOSIS — Z950 Presence of cardiac pacemaker: Secondary | ICD-10-CM | POA: Diagnosis present

## 2013-06-07 DIAGNOSIS — F17200 Nicotine dependence, unspecified, uncomplicated: Secondary | ICD-10-CM | POA: Diagnosis present

## 2013-06-07 DIAGNOSIS — R636 Underweight: Secondary | ICD-10-CM | POA: Diagnosis present

## 2013-06-07 DIAGNOSIS — T887XXA Unspecified adverse effect of drug or medicament, initial encounter: Secondary | ICD-10-CM | POA: Diagnosis not present

## 2013-06-07 DIAGNOSIS — M5137 Other intervertebral disc degeneration, lumbosacral region: Secondary | ICD-10-CM | POA: Diagnosis not present

## 2013-06-07 DIAGNOSIS — R251 Tremor, unspecified: Secondary | ICD-10-CM

## 2013-06-07 DIAGNOSIS — F0280 Dementia in other diseases classified elsewhere without behavioral disturbance: Secondary | ICD-10-CM | POA: Diagnosis not present

## 2013-06-07 DIAGNOSIS — R269 Unspecified abnormalities of gait and mobility: Secondary | ICD-10-CM | POA: Diagnosis not present

## 2013-06-07 DIAGNOSIS — A5211 Tabes dorsalis: Secondary | ICD-10-CM | POA: Diagnosis not present

## 2013-06-07 DIAGNOSIS — A879 Viral meningitis, unspecified: Secondary | ICD-10-CM | POA: Diagnosis not present

## 2013-06-07 DIAGNOSIS — J449 Chronic obstructive pulmonary disease, unspecified: Secondary | ICD-10-CM | POA: Diagnosis present

## 2013-06-07 DIAGNOSIS — F0390 Unspecified dementia without behavioral disturbance: Secondary | ICD-10-CM | POA: Diagnosis present

## 2013-06-07 DIAGNOSIS — J4489 Other specified chronic obstructive pulmonary disease: Secondary | ICD-10-CM | POA: Diagnosis not present

## 2013-06-07 DIAGNOSIS — R259 Unspecified abnormal involuntary movements: Secondary | ICD-10-CM | POA: Diagnosis not present

## 2013-06-07 DIAGNOSIS — M6281 Muscle weakness (generalized): Secondary | ICD-10-CM | POA: Diagnosis not present

## 2013-06-07 DIAGNOSIS — Z79899 Other long term (current) drug therapy: Secondary | ICD-10-CM

## 2013-06-07 DIAGNOSIS — T50905A Adverse effect of unspecified drugs, medicaments and biological substances, initial encounter: Secondary | ICD-10-CM

## 2013-06-07 DIAGNOSIS — F1021 Alcohol dependence, in remission: Secondary | ICD-10-CM | POA: Diagnosis present

## 2013-06-07 DIAGNOSIS — E871 Hypo-osmolality and hyponatremia: Secondary | ICD-10-CM | POA: Diagnosis not present

## 2013-06-07 DIAGNOSIS — A523 Neurosyphilis, unspecified: Secondary | ICD-10-CM | POA: Diagnosis not present

## 2013-06-07 DIAGNOSIS — G20 Parkinson's disease: Secondary | ICD-10-CM | POA: Diagnosis not present

## 2013-06-07 DIAGNOSIS — Z21 Asymptomatic human immunodeficiency virus [HIV] infection status: Secondary | ICD-10-CM

## 2013-06-07 DIAGNOSIS — Z681 Body mass index (BMI) 19 or less, adult: Secondary | ICD-10-CM

## 2013-06-07 DIAGNOSIS — M519 Unspecified thoracic, thoracolumbar and lumbosacral intervertebral disc disorder: Secondary | ICD-10-CM | POA: Diagnosis not present

## 2013-06-07 DIAGNOSIS — I441 Atrioventricular block, second degree: Secondary | ICD-10-CM | POA: Diagnosis present

## 2013-06-07 DIAGNOSIS — B019 Varicella without complication: Secondary | ICD-10-CM | POA: Diagnosis present

## 2013-06-07 DIAGNOSIS — G309 Alzheimer's disease, unspecified: Secondary | ICD-10-CM | POA: Diagnosis present

## 2013-06-07 DIAGNOSIS — Z9181 History of falling: Secondary | ICD-10-CM

## 2013-06-07 DIAGNOSIS — G030 Nonpyogenic meningitis: Secondary | ICD-10-CM

## 2013-06-07 DIAGNOSIS — R21 Rash and other nonspecific skin eruption: Secondary | ICD-10-CM | POA: Diagnosis not present

## 2013-06-07 DIAGNOSIS — M5020 Other cervical disc displacement, unspecified cervical region: Secondary | ICD-10-CM | POA: Diagnosis not present

## 2013-06-07 DIAGNOSIS — B20 Human immunodeficiency virus [HIV] disease: Principal | ICD-10-CM | POA: Diagnosis present

## 2013-06-07 DIAGNOSIS — R839 Unspecified abnormal finding in cerebrospinal fluid: Secondary | ICD-10-CM | POA: Diagnosis not present

## 2013-06-07 DIAGNOSIS — M5030 Other cervical disc degeneration, unspecified cervical region: Secondary | ICD-10-CM | POA: Diagnosis not present

## 2013-06-07 DIAGNOSIS — M51379 Other intervertebral disc degeneration, lumbosacral region without mention of lumbar back pain or lower extremity pain: Secondary | ICD-10-CM | POA: Diagnosis not present

## 2013-06-07 DIAGNOSIS — G039 Meningitis, unspecified: Secondary | ICD-10-CM | POA: Diagnosis not present

## 2013-06-07 DIAGNOSIS — R55 Syncope and collapse: Secondary | ICD-10-CM

## 2013-06-07 LAB — RAPID URINE DRUG SCREEN, HOSP PERFORMED
Amphetamines: NOT DETECTED
BENZODIAZEPINES: NOT DETECTED
Barbiturates: NOT DETECTED
Cocaine: NOT DETECTED
Opiates: NOT DETECTED
Tetrahydrocannabinol: NOT DETECTED

## 2013-06-07 LAB — CSF CELL COUNT WITH DIFFERENTIAL
EOS CSF: NONE SEEN % (ref 0–1)
Lymphs, CSF: 35 % — ABNORMAL LOW (ref 40–80)
Monocyte-Macrophage-Spinal Fluid: 5 % — ABNORMAL LOW (ref 15–45)
RBC Count, CSF: 0 /mm3
SEGMENTED NEUTROPHILS-CSF: 60 % — AB (ref 0–6)
Tube #: 1
WBC CSF: 86 /mm3 — AB (ref 0–5)

## 2013-06-07 LAB — GRAM STAIN

## 2013-06-07 LAB — URINALYSIS, ROUTINE W REFLEX MICROSCOPIC
BILIRUBIN URINE: NEGATIVE
Glucose, UA: NEGATIVE mg/dL
Hgb urine dipstick: NEGATIVE
Ketones, ur: NEGATIVE mg/dL
Leukocytes, UA: NEGATIVE
Nitrite: NEGATIVE
PH: 7.5 (ref 5.0–8.0)
Protein, ur: NEGATIVE mg/dL
Specific Gravity, Urine: 1.01 (ref 1.005–1.030)
Urobilinogen, UA: 2 mg/dL — ABNORMAL HIGH (ref 0.0–1.0)

## 2013-06-07 LAB — CBC
HEMATOCRIT: 36.4 % — AB (ref 39.0–52.0)
Hemoglobin: 12.6 g/dL — ABNORMAL LOW (ref 13.0–17.0)
MCH: 31 pg (ref 26.0–34.0)
MCHC: 34.6 g/dL (ref 30.0–36.0)
MCV: 89.4 fL (ref 78.0–100.0)
PLATELETS: 193 10*3/uL (ref 150–400)
RBC: 4.07 MIL/uL — AB (ref 4.22–5.81)
RDW: 11.5 % (ref 11.5–15.5)
WBC: 4.3 10*3/uL (ref 4.0–10.5)

## 2013-06-07 LAB — COMPREHENSIVE METABOLIC PANEL
ALK PHOS: 77 U/L (ref 39–117)
ALT: 20 U/L (ref 0–53)
AST: 18 U/L (ref 0–37)
Albumin: 3.4 g/dL — ABNORMAL LOW (ref 3.5–5.2)
BUN: 12 mg/dL (ref 6–23)
CO2: 30 mEq/L (ref 19–32)
Calcium: 9 mg/dL (ref 8.4–10.5)
Chloride: 97 mEq/L (ref 96–112)
Creatinine, Ser: 0.85 mg/dL (ref 0.50–1.35)
GFR calc Af Amer: >90 mL/min (ref >90)
Glucose, Bld: 140 mg/dL — ABNORMAL HIGH (ref 70–99)
Potassium: 4 mEq/L (ref 3.7–5.3)
Sodium: 136 mEq/L — ABNORMAL LOW (ref 137–147)
Total Bilirubin: 0.5 mg/dL (ref 0.3–1.2)
Total Protein: 8.3 g/dL (ref 6.0–8.3)

## 2013-06-07 LAB — CBC WITH DIFFERENTIAL/PLATELET
BASOS ABS: 0.1 10*3/uL (ref 0.0–0.1)
Basophils Relative: 1 % (ref 0–1)
Eosinophils Absolute: 0.9 10*3/uL — ABNORMAL HIGH (ref 0.0–0.7)
Eosinophils Relative: 18 % — ABNORMAL HIGH (ref 0–5)
HCT: 39.9 % (ref 39.0–52.0)
Hemoglobin: 14 g/dL (ref 13.0–17.0)
Lymphocytes Relative: 24 % (ref 12–46)
Lymphs Abs: 1.2 10*3/uL (ref 0.7–4.0)
MCH: 31.6 pg (ref 26.0–34.0)
MCHC: 35.1 g/dL (ref 30.0–36.0)
MCV: 90.1 fL (ref 78.0–100.0)
Monocytes Absolute: 0.5 10*3/uL (ref 0.1–1.0)
Monocytes Relative: 9 % (ref 3–12)
NEUTROS ABS: 2.3 10*3/uL (ref 1.7–7.7)
Neutrophils Relative %: 48 % (ref 43–77)
Platelets: 195 10*3/uL (ref 150–400)
RBC: 4.43 MIL/uL (ref 4.22–5.81)
RDW: 11.5 % (ref 11.5–15.5)
WBC: 4.9 10*3/uL (ref 4.0–10.5)

## 2013-06-07 LAB — CREATININE, SERUM
Creatinine, Ser: 0.68 mg/dL (ref 0.50–1.35)
GFR calc non Af Amer: >90 mL/min (ref >90)

## 2013-06-07 LAB — CBG MONITORING, ED: Glucose-Capillary: 76 mg/dL (ref 70–99)

## 2013-06-07 LAB — PROTEIN AND GLUCOSE, CSF
Glucose, CSF: 7 mg/dL — CL (ref 43–76)
Total  Protein, CSF: >600 mg/dL — ABNORMAL HIGH (ref 15–45)

## 2013-06-07 LAB — PROTIME-INR
INR: 1 (ref 0.00–1.49)
Prothrombin Time: 13 seconds (ref 11.6–15.2)

## 2013-06-07 LAB — CRYPTOCOCCAL ANTIGEN, CSF: CRYPTO AG: NEGATIVE

## 2013-06-07 LAB — TROPONIN I: Troponin I: <0.3 ng/mL (ref <0.30)

## 2013-06-07 MED ORDER — VANCOMYCIN HCL IN DEXTROSE 750-5 MG/150ML-% IV SOLN
750.0000 mg | Freq: Two times a day (BID) | INTRAVENOUS | Status: DC
  Administered 2013-06-07 – 2013-06-08 (×2): 750 mg via INTRAVENOUS
  Filled 2013-06-07 (×4): qty 150

## 2013-06-07 MED ORDER — IOHEXOL 300 MG/ML  SOLN
75.0000 mL | Freq: Once | INTRAMUSCULAR | Status: AC | PRN
  Administered 2013-06-07: 75 mL via INTRAVENOUS

## 2013-06-07 MED ORDER — ASPIRIN 81 MG PO CHEW
81.0000 mg | CHEWABLE_TABLET | Freq: Every day | ORAL | Status: DC
  Administered 2013-06-07 – 2013-06-21 (×14): 81 mg via ORAL
  Filled 2013-06-07 (×15): qty 1

## 2013-06-07 MED ORDER — ATENOLOL 25 MG PO TABS
25.0000 mg | ORAL_TABLET | Freq: Every day | ORAL | Status: DC
  Filled 2013-06-07: qty 1

## 2013-06-07 MED ORDER — SODIUM CHLORIDE 0.9 % IJ SOLN
3.0000 mL | Freq: Two times a day (BID) | INTRAMUSCULAR | Status: DC
  Administered 2013-06-07 – 2013-06-21 (×16): 3 mL via INTRAVENOUS

## 2013-06-07 MED ORDER — FENTANYL CITRATE 0.05 MG/ML IJ SOLN
50.0000 ug | Freq: Once | INTRAMUSCULAR | Status: AC
  Administered 2013-06-07: 50 ug via INTRAVENOUS
  Filled 2013-06-07: qty 2

## 2013-06-07 MED ORDER — LISINOPRIL 5 MG PO TABS
5.0000 mg | ORAL_TABLET | Freq: Every day | ORAL | Status: DC
  Administered 2013-06-08 – 2013-06-15 (×8): 5 mg via ORAL
  Filled 2013-06-07 (×10): qty 1

## 2013-06-07 MED ORDER — DEXTROSE 5 % IV SOLN
2.0000 g | Freq: Two times a day (BID) | INTRAVENOUS | Status: DC
  Administered 2013-06-07 – 2013-06-08 (×2): 2 g via INTRAVENOUS
  Filled 2013-06-07 (×3): qty 2

## 2013-06-07 MED ORDER — SODIUM CHLORIDE 0.9 % IV BOLUS (SEPSIS)
500.0000 mL | Freq: Once | INTRAVENOUS | Status: AC
  Administered 2013-06-07: 500 mL via INTRAVENOUS

## 2013-06-07 MED ORDER — ASPIRIN 81 MG PO TABS: 81.0000 mg | ORAL_TABLET | Freq: Every day | ORAL | Status: DC

## 2013-06-07 MED ORDER — DEXTROSE 5 % IV SOLN
10.0000 mg/kg | Freq: Three times a day (TID) | INTRAVENOUS | Status: DC
  Administered 2013-06-07 – 2013-06-09 (×5): 525 mg via INTRAVENOUS
  Filled 2013-06-07 (×7): qty 10.5

## 2013-06-07 MED ORDER — HEPARIN SODIUM (PORCINE) 5000 UNIT/ML IJ SOLN
5000.0000 [IU] | Freq: Three times a day (TID) | INTRAMUSCULAR | Status: DC
  Administered 2013-06-07 – 2013-06-21 (×41): 5000 [IU] via SUBCUTANEOUS
  Filled 2013-06-07 (×44): qty 1

## 2013-06-07 MED ORDER — SODIUM CHLORIDE 0.9 % IV SOLN
3.0000 g | Freq: Four times a day (QID) | INTRAVENOUS | Status: DC
  Administered 2013-06-07: 3 g via INTRAVENOUS
  Filled 2013-06-07 (×3): qty 3

## 2013-06-07 MED ORDER — DEXTROSE 5 % IV SOLN: 2.0000 g | Freq: Two times a day (BID) | INTRAVENOUS | Status: DC

## 2013-06-07 NOTE — Progress Notes (Signed)
ANTIBIOTIC CONSULT NOTE - INITIAL  Pharmacy Consult for Acyclovir, Unasyn and vancomycin Indication: Meningitis  No Known Allergies  Patient Measurements:   Adjusted Body Weight: 52.5 kg  Vital Signs: Temp: 98.1 F (36.7 C) (04/08 0949) Temp src: Oral (04/08 0949) BP: 130/85 mmHg (04/08 1745) Pulse Rate: 56 (04/08 1745) Intake/Output from previous day:   Intake/Output from this shift:    Labs:  Recent Labs  06/07/13 1010  WBC 4.9  HGB 14.0  PLT 195  CREATININE 0.85   The CrCl is unknown because both a height and weight (above a minimum accepted value) are required for this calculation. No results found for this basename: VANCOTROUGH, Corlis Leak, VANCORANDOM, Beatrice, GENTPEAK, GENTRANDOM, TOBRATROUGH, TOBRAPEAK, TOBRARND, AMIKACINPEAK, AMIKACINTROU, AMIKACIN,  in the last 72 hours   Microbiology: Recent Results (from the past 720 hour(s))  GRAM STAIN     Status: None   Collection Time    06/07/13  3:55 PM      Result Value Ref Range Status   Specimen Description CSF   Final   Special Requests 6.0ML FLUID   Final   Gram Stain     Final   Value: CYTOSPIN PREP     WBC PRESENT, PREDOMINANTLY PMN     NO ORGANISMS SEEN   Report Status 06/07/2013 FINAL   Final    Medical History: Past Medical History  Diagnosis Date  . Hypertension   . Second degree AV block   . Syncope   . 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 LP obtained CSF cell count with elevated wbc, and protein, low glucose indicating bacterial infection. pharmacy is consulted to start vancomycin, unasyn, acyclovir empirically for meningitis. He is afebrile, wbc 4.9. Scr 0.85, est. GFR ~ 70 ml/min. Also started rocephin 2g Q 12 hrs. Crypto antigen neg, blood culture, HSV pcr, fungus smear, hepatitis  panel pending.  Goal of Therapy:  Vancomycin trough level 15-20 mcg/ml  Plan:  - Vancomycin 750 mg IV Q 12 hrs - Unasyn 3g IV Q 6 hrs - Acyclovir 525 mg (10mg /kg) IV Q 8 hrs - f/u cultures and renal function - Vancomycin trough at steady state.  Maryanna Shape, PharmD, BCPS  Clinical Pharmacist  Pager: [phone]   06/07/2013,6:52 PM

## 2013-06-07 NOTE — ED Notes (Signed)
Pt and family updated on bed assignment progress

## 2013-06-07 NOTE — ED Notes (Signed)
Dr Doy Mince aware of critical labs from [facility]

## 2013-06-07 NOTE — Procedures (Signed)
.  Procedure: LP with Fluoro guidance. Specimen: CSF - to lab Bleeding: Minimal. Complications: None immediate. Patient   -Condition: Stable.  -Disposition:  Back to ED for further care.  Full Radiology Report to Follow under IMAGING

## 2013-06-07 NOTE — ED Notes (Signed)
Patient transported to X-ray 

## 2013-06-07 NOTE — Consult Note (Addendum)
Reason for Consult:Difficulty with  gait Referring Physician: Aline Brochure  CC: Difficulty with gait  HPI: Constant Mandeville is an 58 y.o. male who is sent here for work up of HIV status and difficulty with gait.  Patient reports that he has had difficulty with gait for the past 1-2 years.  He has also had a tremor that has been present for the past 6 months.  Patient was being seen on an outpatient basis.  Had positive HIV testing and syphilis testing.  Concern is for neurosyphilis.  Patient sent to the ED for further work up.    Past Medical History  Diagnosis Date  . Hypertension   . Second degree AV block   . Syncope   . Pacemaker -Medtronic     DOI 2012    Past Surgical History  Procedure Laterality Date  . Pacemaker insertion      Family History  Problem Relation Age of Onset  . Cancer Mother   . Heart disease Father   . Heart disease Sister   . Heart disease Brother     Social History:  reports that he has been smoking Cigarettes.  He has a 8.75 pack-year smoking history. He does not have any smokeless tobacco history on file. He reports that he does not drink alcohol or use illicit drugs.  No Known Allergies  Medications: I have reviewed the patient's current medications. Prior to Admission:  Current outpatient prescriptions: aspirin 81 MG tablet, Take 1 tablet (81 mg total) by mouth daily., Disp: 180 tablet, Rfl: 2;   atenolol (TENORMIN) 25 MG tablet, Take 25 mg by mouth daily., Disp: , Rfl: ;   lisinopril (PRINIVIL,ZESTRIL) 5 MG tablet, Take 1 tablet (5 mg total) by mouth daily., Disp: 90 tablet, Rfl: 3;   Nutritional Supplements (ENSURE ACTIVE HIGH PROTEIN) LIQD, Take 1 Can by mouth 3 (three) times daily., Disp: 60 Can,  carbidopa-levodopa (SINEMET IR) 25-100 MG per tablet, Take 1 tablet by mouth 3 (three) times daily., Disp: 90 tablet, Rfl: 5  ROS: History obtained from the patient  General ROS: negative for - chills, fatigue, fever, night sweats, weight gain or weight  loss Psychological ROS: negative for - behavioral disorder, hallucinations, memory difficulties, mood swings or suicidal ideation Ophthalmic ROS: negative for - blurry vision, double vision, eye pain or loss of vision ENT ROS: HOH Allergy and Immunology ROS: negative for - hives or itchy/watery eyes Hematological and Lymphatic ROS: negative for - bleeding problems, bruising or swollen lymph nodes Endocrine ROS: negative for - galactorrhea, hair pattern changes, polydipsia/polyuria or temperature intolerance Respiratory ROS: negative for - cough, hemoptysis, shortness of breath or wheezing Cardiovascular ROS: negative for - chest pain, dyspnea on exertion, edema or irregular heartbeat Gastrointestinal ROS: negative for - abdominal pain, diarrhea, hematemesis, nausea/vomiting or stool incontinence Genito-Urinary ROS: negative for - dysuria, hematuria, incontinence or urinary frequency/urgency Musculoskeletal ROS: negative for - joint swelling or muscular weakness Neurological ROS: as noted in HPI Dermatological ROS: negative for rash and skin lesion changes  Physical Examination: Blood pressure 119/78, pulse 76, temperature 98.1 F (36.7 C), temperature source Oral, resp. rate 20, SpO2 99.00%.  Neurologic Examination Mental Status: Alert, oriented, thought content appropriate.  Speech fluent without evidence of aphasia.  Able to follow 3 step commands without difficulty. Cranial Nerves: II: Discs flat bilaterally; Visual fields grossly normal, pupils equal, round, reactive to light and accommodation III,IV, VI: ptosis not present, extra-ocular motions intact bilaterally V,VII: smile symmetric, facial light touch sensation normal bilaterally VIII:  hearing decreased bilaterally IX,X: gag reflex present XI: bilateral shoulder shrug XII: midline tongue extension Motor: Right : Upper extremity   5/5    Left:     Upper extremity   5/5  Lower extremity   5/5     Lower extremity   5/5 Tone and  bulk: increased tone in both upper extremities.  Tremor noted in BUE's, right greater than left Sensory: Pinprick and light touch intact throughout, bilaterally Deep Tendon Reflexes: 2+ and symmetric throughout Plantars: Right: downgoing   Left: downgoing Cerebellar: normal finger-to-nose testing bilaterally.  Dysmetria with heel to shin testing with the right lower extremity Gait: Unable to test CV: pulses palpable throughout     Laboratory Studies:   Basic Metabolic Panel:  Recent Labs Lab 06/05/13 1033 06/07/13 1010  NA 133* 136*  K  --  4.0  CL  --  97  CO2  --  30  GLUCOSE  --  140*  BUN  --  12  CREATININE  --  0.85  CALCIUM  --  9.0    Liver Function Tests:  Recent Labs Lab 06/07/13 1010  AST 18  ALT 20  ALKPHOS 77  BILITOT 0.5  PROT 8.3  ALBUMIN 3.4*   No results found for this basename: LIPASE, AMYLASE,  in the last 168 hours No results found for this basename: AMMONIA,  in the last 168 hours  CBC:  Recent Labs Lab 06/07/13 1010  WBC 4.9  NEUTROABS 2.3  HGB 14.0  HCT 39.9  MCV 90.1  PLT 195    Cardiac Enzymes:  Recent Labs Lab 06/07/13 1010  TROPONINI <0.30    BNP: No components found with this basename: POCBNP,   CBG: No results found for this basename: GLUCAP,  in the last 168 hours  Microbiology: Results for orders placed during the hospital encounter of 03/14/10  SURGICAL PCR SCREEN     Status: Abnormal   Collection Time    03/14/10  7:12 AM      Result Value Ref Range Status   MRSA, PCR NEGATIVE  NEGATIVE Final   Staphylococcus aureus   (*) NEGATIVE Final   Value: POSITIVE            The Xpert SA Assay (FDA     approved for NASAL specimens     only), is one component of     a comprehensive surveillance     program.  It is not intended     to diagnose infection nor to     guide or monitor treatment. RESULT CALLED TO, READ BACK BY AND VERIFIED WITH: S TWINE IN CATH LAB 03/14/10 0853 BY K SCHULTZ    Coagulation  Studies:  Recent Labs  06/07/13 1010  LABPROT 13.0  INR 1.00    Urinalysis: No results found for this basename: COLORURINE, APPERANCEUR, LABSPEC, PHURINE, GLUCOSEU, HGBUR, BILIRUBINUR, KETONESUR, PROTEINUR, UROBILINOGEN, NITRITE, LEUKOCYTESUR,  in the last 168 hours  Lipid Panel:     Component Value Date/Time   CHOL 144 07/08/2012 0859   TRIG 81 07/08/2012 0859   HDL 37* 07/08/2012 0859   CHOLHDL 3.9 07/08/2012 0859   VLDL 16 07/08/2012 0859   LDLCALC 91 07/08/2012 0859    HgbA1C:  Lab Results  Component Value Date   HGBA1C 6.0* 06/05/2013    Urine Drug Screen:   No results found for this basename: labopia, cocainscrnur, labbenz, amphetmu, thcu, labbarb    Alcohol Level: No results found for this basename: ETH,  in  the last 168 hours   Imaging: Ct Head W Wo Contrast  06/07/2013   CLINICAL DATA:  HIV and syphilis. Recent diagnosis of Parkinson's disease.  EXAM: CT HEAD WITHOUT AND WITH CONTRAST  TECHNIQUE: Contiguous axial images were obtained from the base of the skull through the vertex without and with intravenous contrast  CONTRAST:  73mL OMNIPAQUE IOHEXOL 300 MG/ML  SOLN  COMPARISON:  CT head without contrast 05/25/2013  FINDINGS: The area of hypoattenuation in the anterior left temporal lobe appears to the parenchymal unlikely related to and ischemia. There is no enhancing lesion evident.  Scattered subcortical white matter hypoattenuation is evident bilaterally. Mild prominence the ventricles is likely related to atrophy.  The postcontrast images demonstrate no pathologic enhancement.  The paranasal sinuses and mastoid air cells are clear. The osseous skull is intact.  IMPRESSION: 1. Hypoattenuation in the left temporal tip appears parenchymal and are likely related to ischemia of and an extra-axial arachnoid cyst. 2. Diffuse subcortical white matter hypoattenuation bilaterally is stable. 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 3. Mild prominence the ventricles bilaterally is likely due  to atrophy. Mild hydrocephalus is considered less likely. Lumbar puncture may be useful for further evaluation.   Electronically Signed   By: Lawrence Santiago M.D.   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  Also being evaluated for gait abnormalities and tremor, not of new onset.  Can not rue out neurosyphilis as a cause.  Further work up recommended.  Recommendations: 1.  CT of the head with and without contrast.  Patient unable to have a MRI due to pacer. 2.  LP to rule out neurosyphilis 3.  ID to se patient and complete HIV evaluation.  CD4/CD8 ordered 4.  Patient not responding to Sinemet.  Would consider discontinuation.      Alexis Goodell, MD [facility] [phone] 06/07/2013, 12:59 PM

## 2013-06-07 NOTE — ED Provider Notes (Addendum)
CSN: [id number]     Arrival date & time 06/07/13  1324 History   First MD Initiated Contact with Patient 06/07/13 [phone]     Chief Complaint  Patient presents with  . Shaking     (Consider location/radiation/quality/duration/timing/severity/associated sxs/prior Treatment) Patient is a 58 y.o. male presenting with neurologic complaint. The history is provided by the patient.  Neurologic Problem This is a new problem. The current episode started more than 1 week ago. The problem occurs constantly. The problem has been gradually worsening. Pertinent negatives include no chest pain, no abdominal pain, no headaches and no shortness of breath. Nothing aggravates the symptoms. Nothing relieves the symptoms. He has tried nothing for the symptoms. The treatment provided no relief.    Past Medical History  Diagnosis Date  . Hypertension   . Second degree AV block   . Syncope   . Pacemaker -Medtronic     DOI 2012   Past Surgical History  Procedure Laterality Date  . Pacemaker insertion     Family History  Problem Relation Age of Onset  . Cancer Mother   . Heart disease Father   . Heart disease Sister   . Heart disease Brother    History  Substance Use Topics  . Smoking status: Current Every Day Smoker -- 0.25 packs/day for 35 years    Types: Cigarettes  . Smokeless tobacco: Not on file  . Alcohol Use: No    Review of Systems  Constitutional: Negative for fever.  HENT: Negative for drooling and rhinorrhea.   Eyes: Negative for pain.  Respiratory: Negative for cough and shortness of breath.   Cardiovascular: Negative for chest pain and leg swelling.  Gastrointestinal: Negative for nausea, vomiting, abdominal pain and diarrhea.  Genitourinary: Negative for dysuria and hematuria.  Musculoskeletal: Negative for gait problem and neck pain.  Skin: Negative for color change.  Neurological: Positive for tremors and weakness. Negative for numbness and headaches.  Hematological: Negative  for adenopathy.  Psychiatric/Behavioral: Negative for behavioral problems.  All other systems reviewed and are negative.     Allergies  Review of patient's allergies indicates no known allergies.  Home Medications   Current Outpatient Rx  Name  Route  Sig  Dispense  Refill  . aspirin 81 MG tablet   Oral   Take 1 tablet (81 mg total) by mouth daily.   180 tablet   2   . atenolol (TENORMIN) 50 MG tablet      Take one tablet by mouth once daily   90 tablet   3   . carbidopa-levodopa (SINEMET IR) 25-100 MG per tablet   Oral   Take 1 tablet by mouth 3 (three) times daily.   90 tablet   5   . lisinopril (PRINIVIL,ZESTRIL) 5 MG tablet   Oral   Take 1 tablet (5 mg total) by mouth daily.   90 tablet   3   . Nutritional Supplements (ENSURE ACTIVE HIGH PROTEIN) LIQD   Oral   Take 1 Can by mouth 3 (three) times daily.   60 Can   2    BP 156/100  Pulse 80  Temp(Src) 98.1 F (36.7 C) (Oral)  Resp 22  SpO2 96% Physical Exam  Nursing note and vitals reviewed. Constitutional: He is oriented to person, place, and time. He appears well-developed and well-nourished.  HENT:  Head: Normocephalic and atraumatic.  Right Ear: External ear normal.  Left Ear: External ear normal.  Nose: Nose normal.  Mouth/Throat: Oropharynx is clear  and moist. No oropharyngeal exudate.  Eyes: Conjunctivae and EOM are normal. Pupils are equal, round, and reactive to light.  Neck: Normal range of motion. Neck supple.  Cardiovascular: Normal rate, regular rhythm, normal heart sounds and intact distal pulses.  Exam reveals no gallop and no friction rub.   No murmur heard. Pulmonary/Chest: Effort normal and breath sounds normal. No respiratory distress. He has no wheezes.  Abdominal: Soft. Bowel sounds are normal. He exhibits no distension. There is no tenderness. There is no rebound and no guarding.  Musculoskeletal: Normal range of motion. He exhibits no edema and no tenderness.  Neurological:  He is alert and oriented to person, place, and time. He displays no Babinski's sign on the right side. He displays no Babinski's sign on the left side.  Reflex Scores:      Tricep reflexes are 2+ on the right side and 2+ on the left side.      Bicep reflexes are 2+ on the right side and 2+ on the left side.      Brachioradialis reflexes are 2+ on the right side and 2+ on the left side.      Patellar reflexes are 2+ on the right side and 2+ on the left side.      Achilles reflexes are 2+ on the right side and 2+ on the left side. alert, oriented x2, thought it was 2014 speech: normal in context and clarity memory: intact grossly cranial nerves II-XII: intact motor strength: full proximally and distally, mild tremor in LUE, moderate tremor in RUE, mild clonus noted in bilateral LE's sensation: intact to light touch diffusely  cerebellar: finger-to-nose and heel-to-shin intact gait: shuffling gait forwards and backwards   Skin: Skin is warm and dry.  Psychiatric: He has a normal mood and affect. His behavior is normal.    ED Course  LUMBAR PUNCTURE Date/Time: 06/07/2013 9:49 PM Performed by: Purvis Sheffield, S Authorized by: Purvis Sheffield, S Consent: Verbal consent obtained. written consent obtained. Risks and benefits: risks, benefits and alternatives were discussed Consent given by: patient (partner) Patient understanding: patient states understanding of the procedure being performed Patient consent: the patient's understanding of the procedure matches consent given Procedure consent: procedure consent matches procedure scheduled Relevant documents: relevant documents present and verified Test results: test results available and properly labeled Site marked: the operative site was marked Imaging studies: imaging studies available Required items: required blood products, implants, devices, and special equipment available Patient identity confirmed: verbally with patient,  provided demographic data, arm band and hospital-assigned identification number Time out: Immediately prior to procedure a "time out" was called to verify the correct patient, procedure, equipment, support staff and site/side marked as required. Indications: evaluation for infection Anesthesia: local infiltration Local anesthetic: lidocaine 1% without epinephrine Anesthetic total: 8 ml Patient sedated: no Preparation: Patient was prepped and draped in the usual sterile fashion. Lumbar space: L4-L5 interspace Patient's position: left lateral decubitus (also attempted while sitting) Needle gauge: 22 Needle type: spinal needle - Quincke tip Number of attempts: 5 or more Patient tolerance: Patient tolerated the procedure well with no immediate complications. Comments: Unable to obtain csf.    (including critical care time) Labs Review Labs Reviewed  CBC WITH DIFFERENTIAL - Abnormal; Notable for the following:    Eosinophils Relative 18 (*)    Eosinophils Absolute 0.9 (*)    All other components within normal limits  COMPREHENSIVE METABOLIC PANEL - Abnormal; Notable for the following:    Sodium 136 (*)  Glucose, Bld 140 (*)    Albumin 3.4 (*)    All other components within normal limits  URINALYSIS, ROUTINE W REFLEX MICROSCOPIC - Abnormal; Notable for the following:    Urobilinogen, UA 2.0 (*)    All other components within normal limits  CSF CELL COUNT WITH DIFFERENTIAL - Abnormal; Notable for the following:    Color, CSF YELLOW (*)    Appearance, CSF CLEAR (*)    WBC, CSF 86 (*)    Segmented Neutrophils-CSF 60 (*)    Lymphs, CSF 35 (*)    Monocyte-Macrophage-Spinal Fluid 5 (*)    All other components within normal limits  PROTEIN AND GLUCOSE, CSF - Abnormal; Notable for the following:    Glucose, CSF 7 (*)    Total  Protein, CSF >600 (*)    All other components within normal limits  CBC - Abnormal; Notable for the following:    RBC 4.07 (*)    Hemoglobin 12.6 (*)    HCT  36.4 (*)    All other components within normal limits  GRAM STAIN  CSF CULTURE  CULTURE, BLOOD (ROUTINE X 2)  CULTURE, BLOOD (ROUTINE X 2)  FUNGUS CULTURE W SMEAR  VIRAL CULTURE VIRC  URINE RAPID DRUG SCREEN (HOSP PERFORMED)  PROTIME-INR  TROPONIN I  CRYPTOCOCCAL ANTIGEN, CSF  CREATININE, SERUM  CD4/CD8 (T-HELPER/T-SUPPRESSOR CELL)  VDRL, CSF  CRYPTOCOCCAL ANTIGEN  VARICELLA ZOSTER ANTIBODY, IGG  VARICELLA ZOSTER ANTIBODY, IGM  HERPES SIMPLEX VIRUS(HSV) DNA BY PCR  HEPATITIS B SURFACE ANTIBODY  HEPATITIS B SURFACE ANTIGEN  HEPATITIS C ANTIBODY  CBG MONITORING, ED   Imaging Review Ct Head W Wo Contrast  06/07/2013   CLINICAL DATA:  HIV and syphilis. Recent diagnosis of Parkinson's disease.  EXAM: CT HEAD WITHOUT AND WITH CONTRAST  TECHNIQUE: Contiguous axial images were obtained from the base of the skull through the vertex without and with intravenous contrast  CONTRAST:  22mL OMNIPAQUE IOHEXOL 300 MG/ML  SOLN  COMPARISON:  CT head without contrast 05/25/2013  FINDINGS: The area of hypoattenuation in the anterior left temporal lobe appears to the parenchymal unlikely related to and ischemia. There is no enhancing lesion evident.  Scattered subcortical white matter hypoattenuation is evident bilaterally. Mild prominence the ventricles is likely related to atrophy.  The postcontrast images demonstrate no pathologic enhancement.  The paranasal sinuses and mastoid air cells are clear. The osseous skull is intact.  IMPRESSION: 1. Hypoattenuation in the left temporal tip appears parenchymal and are likely related to ischemia of and an extra-axial arachnoid cyst. 2. Diffuse subcortical white matter hypoattenuation bilaterally is stable. 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 3. Mild prominence the ventricles bilaterally is likely due to atrophy. Mild hydrocephalus is considered less likely. Lumbar puncture may be useful for further evaluation.   Electronically Signed   By: Lawrence Santiago M.D.    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 HIV. Diagnostic lumbar puncture unsuccessful in the emergency department, requested in radiology. Initial encounter.  EXAM: DIAGNOSTIC LUMBAR PUNCTURE UNDER FLUOROSCOPIC GUIDANCE  FLUOROSCOPY TIME:  0 min in 29 seconds.  PROCEDURE: Informed consent was obtained from the patient prior to the procedure, including potential complications of headache, allergy, and pain. A "time-out" was performed.  With the patient prone, the lower back was prepped with Betadine. 1% Lidocaine was used for local anesthesia. Lumbar puncture was performed at the L2-L3 level using a 3.5  inch x 20 gauge needle with return of amber tinted CSF with an opening pressure of 19 cm water. 18 ml of CSF were obtained for laboratory studies. The patient tolerated the procedure well and there were no apparent complications. He was returned to the emergency department for further evaluation and care.  IMPRESSION: Lumbar puncture at L2-L3 with 18 mL of CSF collected. Normal opening pressure of 19 cm of water.   Electronically Signed   By: Lars Pinks M.D.   On: 06/07/2013 16:23     EKG Interpretation None      MDM   Final diagnoses:  Tremulousness  Lower extremity weakness    10:08 AM 58 y.o. male with a history of hypertension, second degree AV block status post pacemaker placement who was sent here by his neurologist, Dr. Carles Collet. The patient and his wife state that he has had lower extremity weakness for several years and worsening tremor in his upper extremities. His girlfriend states that he was referred to neurology for evaluation. He was found to have a positive HIV and syphilis test and was supposed to have a CT of his head this morning. His neurologist reevaluated him this morning and recommended he come to the hospital for admission given the results of these tests. He also expressed some suicidal  ideations at his neurologist appointment this morning. On our discussion he states that he occasionally has suicidal thoughts but is not currently suicidal. He is afebrile and vital signs are unremarkable here. Will discuss the case with neurology and get imaging and lab work.  Neurology has seen the pt.   Unable to get csf on my LP attempt. Will have LP performed by rad under fluoro.   Will admit to family medicine.     Blanchard Kelch, MD 06/07/13 6301  Blanchard Kelch, MD 06/07/13 2153

## 2013-06-07 NOTE — ED Notes (Signed)
Neuro paged for second time to make them aware of critical labs

## 2013-06-07 NOTE — ED Notes (Signed)
Doy Mince, MD neurology at bedside.

## 2013-06-07 NOTE — ED Notes (Signed)
Family practice has arrived to see pt . They advise that they did not receive page but did see lab results

## 2013-06-07 NOTE — H&P (Signed)
[facility] Admission History and Physical Service Pager: [phone]  Patient name: Shaun Lutz Medical record number: [id number] Date of birth: August 16, 1955 Age: 58 y.o. Gender: male  Primary Care Provider: Donnamae Jude, MD Consultants: neuro, ID Code Status: Full  Chief Complaint: tremor, gait, memory loss  Assessment and Plan: Shaun Lutz is a 58 y.o. 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 PMH is significant for HTN, second degree AV block s/p pacemaker placement, vitiligo, recent dx HIV/syphilis  # ID- HIV positive, Syphilis positive, concern for meningitis Ddx includes Neurosyphilis (with post RPR and t pallidum >8) vs HIV CNS involvement (toxo, lymphoma, PML, encephalopathy,CMV, fungal) vs Viral infection unrelated. Spinal fluid with total protein >600 and glucose 7 with 86 WBC suggestive of bacterial infection. Tertiary syphilis can present up to 25 years after infection and can be assc with dementia (as evidenced in memory loss in this patient) skin findings (such as hyperpigmented macules seen), aortitis or aneurysm (will send for 2D echo), tabes dorsalis (gait abnormalities, diminished reflexes loss of coordination). Pt may also have complications 2/2 to HIV which can predispose not only to bacterial CNS infections but also fungal. Gram stain neg for organisms, Crypto neg. Utox neg. U/A neg -f/up LP results (CSF culture, VDRL, cytology,fungal csf, HSV culture) -CT head (cannot obtain MRI 2/2 pacemaker) -neuro consulted in the ED, appreciate assistance -ID to follow, appreciate recs and management, case discussed with Dr. Baxter Flattery via phone, appreciate recomendations -CD4/CD8 -varicella igm/igg -covering with amp, rocephin, vanc, acyclovir will narrow as clinically appropriate -hep B surface antigen, hep C to risk stratify  -2D echo  #Neuro- Gait  abnormality, hearing loss, CNS infection, rigidity Ddx includes sequelae of HIV/syphilis vs parkinsonian features vs demyelinating d/o vs NPH vs vitamin deficiency vs metabolic abnormality. Likely patient having features related to progression of infectious disease (see above). However may also have component of parkisons given dysmetria, memory loss, rigidity. Unlikely NPH given nml ventricles on CT head (however gait changes accompanied dementia and urinary issues classic presentation)  -work up as above -neuro eval in the ED and following -initially prescribed carbidopa-levodopa by neurologist but has not started taking yet  #CV- HTN/second degree AV block s/p pacemaker placement 2012 Currently normotensive, intermittently bradycardic; will assess for possible aneurysm/vegetation in light of ongoing infectious process -cont lisinopril -hold atenolol -2D echo as above -cont ASA  #Pulm- COPD? Moving air well -no prescriptions for COPD currently -vitals per floor protocol, will cont to monitor with pulse ox  #Psych/Social Suicidal ideation- in light of recent diagnosis, denies SI currently and states hasnt had any SI in a week  - need to assess for risk; screen TB, jail time, relationship status - no 1:1 at this time, but will continually reassess   #FEN/GI: HHD, replete electrolytes prn  Prophylaxis: hep Sq  Disposition: admit to t/s under Dr. Gwendlyn Deutscher for further w/up  History of Present Illness: Shaun Lutz is a 58 y.o. male presenting with worsening gait in the last 1-2 years accompanied by onset of tremor approx 6 months ago. History is largely provided by girlfriend as patient very confused and hard of hearing. Per report in the last several months girlfriend has noted patient rapidly declining. Has been stumbling around home, unstable on feet, feeling weak. Does have a history of falls (but no falls since placement of pacemaker in last 3 years). Also with lots of memory difficulties,  girlfriend has to repeat things multiple times. Occasionally will forget where he is but still able to recognize familiar faces. She has also noticed patient having difficulty with hearing in the last 3 weeks that has become acutely worse and associated with some gradual vision loss. Denies fevers, chills, night sweats, weight loss (wt fluctuates), n/v, diarrhea, chest pain, palps, SOB.   Was recently referred to Neuro by Dr. Kennon Rounds for further eval of gait issues. Was seen by [facility] on 4/6 where pt thought to have component of idiopathic parkison's disease given tremor, bradykinesia, rigidity and postural instability. Lab work sent given memory concerns including B12, RPR, Na, and hgbA1C. Pt then subsequently found to have post HIV 1 western blot and RPR 1:512 with P pallidum Ab >8. Was instructed to return to Neuro today 4/8 where concerned was voiced for neurosyphilis in addition to parkinsons. Pt was sent to the ED for LP/further infectious w/up.   In the ED pt afebrile, nml VS. Concern expressed for SI at neuro visit earlier today but not currently admitting. Case was discussed again with neuro. Pt LP'd under fluoro.   Review Of Systems: Per HPI with the following additions: none Otherwise 12 point review of systems was performed and was unremarkable.  Patient Active Problem List   Diagnosis Date Noted  . Human immunodeficiency virus (HIV) disease 06/07/2013  . Syphilis in male 06/07/2013  . Lower extremity weakness 06/07/2013  . Abnormality of gait 05/22/2013  . Decreased hearing of both ears 11/23/2012  . Hypotension 08/22/2012  . Physical deconditioning 08/10/2012  . Underweight 08/10/2012  . Occasional tremors 07/08/2012  . At [facility] for Falls 07/08/2012  . Pacemaker- medtronic 05/13/2012  . SYNCOPE 04/07/2010  . OTHER&UNSPECIFIED ALCOHOL DEPENDENCE REMISSION 03/05/2010  . COPD 03/05/2010  . VITILIGO 03/05/2010  . ATRIOVENTRICULAR BLOCK, MOBITZ TYPE II 03/05/2010   Past  Medical History: Past Medical History  Diagnosis Date  . Hypertension   . Second degree AV block   . Syncope   . Pacemaker -Medtronic     DOI 2012   Past Surgical History: Past Surgical History  Procedure Laterality Date  . Pacemaker insertion     Social History: History  Substance Use Topics  . Smoking status: Current Every Day Smoker -- 0.25 packs/day for 35 years    Types: Cigarettes  . Smokeless tobacco: Not on file  . Alcohol Use: No   Additional social history: has been living with girlfriend for 16 years  Please also refer to relevant sections of EMR.  Family History: Family History  Problem Relation Age of Onset  . Cancer Mother   . Heart disease Father   . Heart disease Sister   . Heart disease Brother    Allergies and Medications: No Known Allergies No current facility-administered medications on file prior to encounter.   Current Outpatient Prescriptions on File Prior to Encounter  Medication Sig Dispense Refill  . aspirin 81 MG tablet Take 1 tablet (81 mg total) by mouth daily.  180 tablet  2  . lisinopril (PRINIVIL,ZESTRIL) 5 MG tablet Take 1 tablet (5 mg total) by mouth daily.  90 tablet  3  . Nutritional Supplements (ENSURE ACTIVE HIGH PROTEIN) LIQD Take 1 Can by mouth 3 (three) times daily.  60 Can  2  . carbidopa-levodopa (SINEMET IR) 25-100 MG per tablet Take 1 tablet by mouth 3 (three) times daily.  90 tablet  5    Objective: BP 126/86  Pulse 65  Temp(Src) 98.1 F (36.7  C) (Oral)  Resp 20  SpO2 100% Exam: Gen: NAD,confused, altered, cooperative with exam HEENT: NCAT, EOMI with horizontal nystagmus, PER, pinpoint and minimally responsive to light; poor dentition; no palatal lesions, white discoloration of tongue Neck: FROM, supple, no rigidity noted (but not formally assessed) CV: irreg ireg (intermittently bradycardic), good S1/S2, no murmur, cap refill <3 Resp: CTABL, no wheezes, non-labored Abd: thin, scaphoid, SNTND, BS present, no  guarding or organomegaly Ext: thin extremities, warm, normal tone, difficulty moving upper and lower extremities 3/5 strength on bilat hand grip, 2-3/5 on bilat hip flexion Neuro: oriented only to self, month, thought it was 2010 and Bush was president; tremulous upon gripping hand rails of bed, FTN with dysmetria, hearing loss but CNii-xii otherwise intact; no clonus or hyperreflexia Skin: vitiligo, multiple hyperpigmented macules throughout entire body including palms and soles; excoriations   Labs and Imaging: CBC BMET   Recent Labs Lab 06/07/13 1010  WBC 4.9  HGB 14.0  HCT 39.9  PLT 195    Recent Labs Lab 06/07/13 1010  NA 136*  K 4.0  CL 97  CO2 30  BUN 12  CREATININE 0.85  GLUCOSE 140*  CALCIUM 9.0     utox neg U/a neg apart from 2.0 protein CSF gram stain no organism          Crypto ag neg   06/07/2013 15:55  Glucose, CSF 7 (LL)  Total  Protein, CSF >600 (H)  RBC Count, CSF 0  WBC, CSF 86 (HH)  Segmented Neutrophils-CSF 60 (H)  Lymphs, CSF 35 (L)  Monocyte-Macrophage-Spinal Fluid 5 (L)  Eosinophils, CSF NONE SEEN  Appearance, CSF CLEAR (A)  Color, CSF YELLOW (A)  Supernatant XANTHOCHROMIC  Tube # 1     Langston Masker, MD 06/07/2013, 5:08 PM PGY-1, [place] Intern pager: [phone], text pages welcome   I have seen and examined the patient with Dr. Skeet Simmer and agree with her documentation above, my annotations are in blue.   Laroy Apple, MD [place] Resident, PGY-2 06/07/2013, 9:13 PM

## 2013-06-07 NOTE — ED Notes (Signed)
Family practice paged.

## 2013-06-07 NOTE — Progress Notes (Signed)
Shaun Lutz was seen today in the movement disorders clinic for neurologic consultation at the request of PRATT,TANYA S, MD.  The consultation is for the evaluation of gait changes, tremor and to rule out NPH.  The pt is accompanied by his accompanied by his long term girlfriend of 16 years who supplements the history.  The pts girlfriend states that initially there was a balance problem prior to the PPM but that was more of passing out problem which resolved with the pacemaker.  However, over the last year they have noted increasing weakness in the legs and when the legs are weak, there is a tremor in the hands and legs.    06/07/13 update:  Pt was just seen 06/05/13.  I asked the pt to come in today for a work in appointment.  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  Pt absolutely denies hx of STD.  "I work hard, I don't cheat."  Doesn't reveal how many lifetime sexual partners despite repeated questioning.  Denies loss of weight.  After revealing labs, girlfriend invited back into the room at the patients request and labs once again revealed at patients request.   PREVIOUS MEDICATIONS: none to date  ALLERGIES:  No Known Allergies  CURRENT MEDICATIONS:  Current Outpatient Prescriptions on File Prior to Visit  Medication Sig Dispense Refill  . aspirin 81 MG tablet Take 1 tablet (81 mg total) by mouth daily.  180 tablet  2  . atenolol (TENORMIN) 50 MG tablet Take one tablet by mouth once daily  90 tablet  3  . carbidopa-levodopa (SINEMET IR) 25-100 MG per tablet Take 1 tablet by mouth 3 (three) times daily.  90 tablet  5  . lisinopril (PRINIVIL,ZESTRIL) 5 MG tablet Take 1 tablet (5 mg total) by mouth daily.  90 tablet  3  . Nutritional Supplements (ENSURE ACTIVE HIGH PROTEIN) LIQD Take 1 Can by mouth 3 (three) times daily.  60 Can  2   No current facility-administered medications on file prior to visit.    PAST MEDICAL HISTORY:   Past Medical History    Diagnosis Date  . Hypertension   . Second degree AV block   . Syncope   . Pacemaker -Medtronic     DOI 2012    PAST SURGICAL HISTORY:   Past Surgical History  Procedure Laterality Date  . Pacemaker insertion      SOCIAL HISTORY:   History   Social History  . Marital Status: Divorced    Spouse Name: N/A    Number of Children: N/A  . Years of Education: N/A   Occupational History  . Not on file.   Social History Main Topics  . Smoking status: Current Every Day Smoker -- 0.25 packs/day for 35 years    Types: Cigarettes  . Smokeless tobacco: Not on file  . Alcohol Use: No  . Drug Use: No  . Sexual Activity: Not on file   Other Topics Concern  . Not on file   Social History Narrative  . No narrative on file    FAMILY HISTORY:   Family Status  Relation Status Death Age  . Mother Deceased     breast cancer  . Father Deceased     heart disease  . Brother Deceased     heart disease  . Sister Deceased     embolism  . Son Alive     healthy  . Daughter Alive  healthy    ROS:  A complete 10 system review of systems was obtained and was unremarkable apart from what is mentioned above.  PHYSICAL EXAMINATION:    VITALS:   Filed Vitals:   06/07/13 0858  BP: 98/66  Pulse: 60  Resp: 16  Height: 5\' 8"  (1.727 m)  Weight: 115 lb 11.2 oz (52.481 kg)    No PE examination today.  Was 100% counseling visit.  Neurological examination:  Orientation: The patient is alert and oriented to month/date but not year.  Scored 19/30 on MMSE on 06/05/13.   LABS:  Lab Results  Component Value Date   TSH 3.417 05/22/2013   Lab Results  Component Value Date   WBC 5.2 05/22/2013   HGB 12.7* 05/22/2013   HCT 37.1* 05/22/2013   MCV 87.1 05/22/2013   PLT 258 05/22/2013     Chemistry      Component Value Date/Time   NA 133* 06/05/2013 1033   K 4.6 05/22/2013 1548   CL 97 05/22/2013 1548   CO2 32 05/22/2013 1548   BUN 12 05/22/2013 1548   CREATININE 0.69 05/22/2013 1548    CREATININE 0.80 04/09/2012 2145      Component Value Date/Time   CALCIUM 8.7 05/22/2013 1548   ALKPHOS 65 05/22/2013 1548   AST 16 05/22/2013 1548   ALT 17 05/22/2013 1548   BILITOT 0.3 05/22/2013 1548       ASSESSMENT/PLAN:  1.  HIV positive on recent labs, RPR @ 3:149  -I am certainly concerned about neurosyphilis as well as new onset HIV.  Pt very much in denial about both and expresses suicidal ideation several times during the visit.  Amongst many other tests, the pt needs a LP.  I am going to send him to the hospital for consideration for admission for testing and further work up.  He refused to go via ambulance but his girlfriend assured me she would have her son drive him across the street and get him there safely and they would not leave him alone.  -He will not start the levodopa given last visit as some of these other things may explain findings on physical examination seen previously.  -I still want to see him at his previously scheduled appt in 8 weeks  - I cancelled his CT of the brain that was originally scheduled for this AM so that I could see him here in the office today.  That should be done at the hospital.  He cannot have a MRI.  -100% of visit 40 min visit in counseling and coordinating care.  Discussed that syphilis and HIV, if they are present, are both very treatable diseases.

## 2013-06-07 NOTE — ED Notes (Addendum)
Pt in for further eval for shaking, per pt friend the pt was seen at [facility] for eval for Parkinson's x 2 days ago, reports that Dr. Carles Collet was seen again today & that he was to come here & be admitted, pt A&O x 4, follows commands, speaks in complete sentences

## 2013-06-07 NOTE — ED Notes (Signed)
Another page placed to neurology.

## 2013-06-08 ENCOUNTER — Observation Stay (HOSPITAL_COMMUNITY): Payer: Medicare Other

## 2013-06-08 DIAGNOSIS — R269 Unspecified abnormalities of gait and mobility: Secondary | ICD-10-CM | POA: Diagnosis not present

## 2013-06-08 DIAGNOSIS — B20 Human immunodeficiency virus [HIV] disease: Secondary | ICD-10-CM | POA: Diagnosis not present

## 2013-06-08 DIAGNOSIS — A539 Syphilis, unspecified: Secondary | ICD-10-CM | POA: Diagnosis not present

## 2013-06-08 DIAGNOSIS — J449 Chronic obstructive pulmonary disease, unspecified: Secondary | ICD-10-CM | POA: Diagnosis not present

## 2013-06-08 DIAGNOSIS — M5030 Other cervical disc degeneration, unspecified cervical region: Secondary | ICD-10-CM | POA: Diagnosis not present

## 2013-06-08 DIAGNOSIS — Z950 Presence of cardiac pacemaker: Secondary | ICD-10-CM

## 2013-06-08 DIAGNOSIS — M5020 Other cervical disc displacement, unspecified cervical region: Secondary | ICD-10-CM | POA: Diagnosis not present

## 2013-06-08 DIAGNOSIS — M5137 Other intervertebral disc degeneration, lumbosacral region: Secondary | ICD-10-CM | POA: Diagnosis not present

## 2013-06-08 DIAGNOSIS — M51379 Other intervertebral disc degeneration, lumbosacral region without mention of lumbar back pain or lower extremity pain: Secondary | ICD-10-CM | POA: Diagnosis not present

## 2013-06-08 DIAGNOSIS — E440 Moderate protein-calorie malnutrition: Secondary | ICD-10-CM | POA: Diagnosis present

## 2013-06-08 DIAGNOSIS — I10 Essential (primary) hypertension: Secondary | ICD-10-CM

## 2013-06-08 DIAGNOSIS — A879 Viral meningitis, unspecified: Secondary | ICD-10-CM

## 2013-06-08 DIAGNOSIS — M519 Unspecified thoracic, thoracolumbar and lumbosacral intervertebral disc disorder: Secondary | ICD-10-CM | POA: Diagnosis not present

## 2013-06-08 LAB — PATHOLOGIST SMEAR REVIEW

## 2013-06-08 LAB — CRYPTOCOCCAL ANTIGEN: CRYPTO AG: NEGATIVE

## 2013-06-08 LAB — CD4/CD8 (T-HELPER/T-SUPPRESSOR CELL)
CD4 ABSOLUTE: 250 /uL — AB (ref 500–1900)
CD4%: 20 % — ABNORMAL LOW (ref 30.0–60.0)
CD8 T Cell Abs: 920 /uL (ref 230–1000)
CD8tox: 73 % — ABNORMAL HIGH (ref 15.0–40.0)
RATIO: 0.27 — AB (ref 1.0–3.0)
Total lymphocyte count: 1270 /uL (ref 1000–4000)

## 2013-06-08 LAB — VARICELLA ZOSTER ANTIBODY, IGG: VARICELLA IGG: 471 {index} — AB (ref <135.00)

## 2013-06-08 LAB — VARICELLA ZOSTER ANTIBODY, IGM: VARICELLA-ZOSTER AB, IGM: 0.02 {ISR} (ref <0.91)

## 2013-06-08 LAB — HEPATITIS B SURFACE ANTIGEN: HEP B S AG: NEGATIVE

## 2013-06-08 LAB — HEPATITIS C ANTIBODY: HCV Ab: NEGATIVE

## 2013-06-08 LAB — HEPATITIS B SURFACE ANTIBODY,QUALITATIVE: HEP B S AB: POSITIVE — AB

## 2013-06-08 MED ORDER — ONDANSETRON HCL 4 MG/2ML IJ SOLN
4.0000 mg | Freq: Once | INTRAMUSCULAR | Status: AC
  Administered 2013-06-08: 4 mg via INTRAVENOUS
  Filled 2013-06-08: qty 2

## 2013-06-08 MED ORDER — PENICILLIN G POTASSIUM 5000000 UNITS IJ SOLR
4.0000 10*6.[IU] | INTRAVENOUS | Status: DC
  Administered 2013-06-08 – 2013-06-19 (×67): 4 10*6.[IU] via INTRAVENOUS
  Filled 2013-06-08 (×73): qty 4

## 2013-06-08 MED ORDER — ENSURE COMPLETE PO LIQD
237.0000 mL | Freq: Three times a day (TID) | ORAL | Status: DC
  Administered 2013-06-08 – 2013-06-21 (×35): 237 mL via ORAL

## 2013-06-08 MED ORDER — ATENOLOL 25 MG PO TABS
25.0000 mg | ORAL_TABLET | Freq: Every day | ORAL | Status: DC
  Administered 2013-06-08 – 2013-06-21 (×13): 25 mg via ORAL
  Filled 2013-06-08 (×15): qty 1

## 2013-06-08 MED ORDER — IOHEXOL 300 MG/ML  SOLN
100.0000 mL | Freq: Once | INTRAMUSCULAR | Status: AC | PRN
  Administered 2013-06-08: 100 mL via INTRAVENOUS

## 2013-06-08 NOTE — Progress Notes (Signed)
Family Medicine Teaching Service Daily Progress Note Intern Pager: [phone]  Patient name: Shaun Lutz Medical record number: [id number] Date of birth: November 13, 1955 Age: 58 y.o. Gender: male  Primary Care Provider: Donnamae Jude, MD Consultants: ID, neuro Code Status: full  Pt Overview and Major Events to Date:   Assessment and Plan: Shaun Lutz is a 58 y.o. 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 Spinal fluid with total protein >600 and glucose 7 with 86 WBC suggestive of bacterial infection. Tertiary syphilis can present up to 25 years after infection and can be assc with dementia (as evidenced in memory loss in this patient) skin findings (such as hyperpigmented macules seen), aortitis or aneurysm (will send for 2D echo), tabes dorsalis (gait abnormalities, diminished reflexes loss of coordination). Pt may also have complications 2/2 to HIV which can predispose not only to bacterial CNS infections but also fungal. Gram stain neg for organisms, Crypto neg. Utox neg. U/A neg. HepB surfaceAg neg, hepC neg. Varicella I.  -f/up LP results (CSF culture, VDRL, cytology,fungal csf, HSV culture)  -neuro consulted in the ED, appreciate assistance  -ID to follow, appreciate recs and management,case discussed with Dr. Baxter Flattery on phone, Dr. Tommy Medal to see this morning -CD4/CD8  -covering with penG, rocephin, vanc, acyclovir will narrow as clinically appropriate  -2D echo   #Neuro- Gait abnormality, hearing loss, CNS infection, rigidity  Ddx includes sequelae of  HIV/syphilis vs parkinsonian features vs demyelinating d/o vs NPH vs vitamin deficiency vs metabolic abnormality. Likely patient having features related to progression of infectious disease (see above). However may also have component of parkisons given dysmetria, memory loss, rigidity. Unlikely NPH given nml ventricles on CT head (however gait changes accompanied dementia and urinary issues classic presentation)  -work up as above  -neuro eval in the ED and following  -initially prescribed carbidopa-levodopa by neurologist but has not started taking yet   #CV- HTN/second degree AV block s/p pacemaker placement 2012 Currently normotensive, intermittently bradycardic (with a pacer); will assess for possible aneurysm/vegetation in light of ongoing infectious process  -cont lisinopril  -hold atenolol, consult cards given bradycardia with ICD, interrogate pacer  -2D echo as above  -cont ASA   #Pulm- COPD? Moving air well  -no prescriptions for COPD currently  -vitals per floor protocol, will cont to monitor with pulse ox   #Psych/Social Suicidal ideation- in light of recent diagnosis, denies SI currently and states hasnt had any SI in a week; girlfriend feels he is severely depressed - no 1:1 at this time, but will continually reassess  - would start ssri  #FEN/GI: HHD, replete electrolytes prn  Disposition: further w/up and treatment of unknown infectious etiology vs malignancy vs neurodegenerative d/o   Subjective: Patient very confused, asking to have statements repeated multiple times; not actively endorsing SI  Objective: Temp:  [97.9 F (36.6 C)-98.6 F (37 C)] 98.6 F (37 C) (04/09 0500) Pulse Rate:  [56-94] 68 (04/09 0500) Resp:  [12-22] 18 (04/09 0500) BP: (98-156)/(66-100) 108/73 mmHg (04/09 0500) SpO2:  [96 %-100 %] 98 % (04/09 0500) Weight:  [115 lb 11.2 oz (52.481 kg)-115 lb  11.9 oz (52.5 kg)] 115 lb 11.9 oz (52.5 kg) (04/08 1900) Physical Exam: Gen: NAD,confused,  altered, cooperative with exam  HEENT: NCAT, EOMI with horizontal nystagmus, PER, pinpoint and minimally responsive to light; poor dentition; no palatal lesions, white discoloration of tongue  Neck: FROM, supple, no rigidity noted (but not formally assessed)  CV: irreg ireg (intermittently bradycardic), good S1/S2, no murmur, cap refill <3  Resp: CTABL, no wheezes, non-labored  Abd: thin, scaphoid, SNTND, BS present, no guarding or organomegaly  Ext: thin extremities, warm, normal tone, difficulty moving upper and lower extremities 3/5 strength on bilat hand grip, 2-3/5 on bilat hip flexion  Neuro: oriented only to self, month, thought it was 1915; tremulous upon gripping hand rails of bed, FTN with dysmetria, hearing loss but CNii-xii otherwise intact; no clonus or hyperreflexia  Skin: vitiligo, multiple hyperpigmented macules throughout entire body including palms and soles; excoriations   Laboratory:  Recent Labs Lab 06/07/13 1010 06/07/13 2025  WBC 4.9 4.3  HGB 14.0 12.6*  HCT 39.9 36.4*  PLT 195 193    Recent Labs Lab 06/05/13 1033 06/07/13 1010 06/07/13 2025  NA 133* 136*  --   K  --  4.0  --   CL  --  97  --   CO2  --  30  --   BUN  --  12  --   CREATININE  --  0.85 0.68  CALCIUM  --  9.0  --   PROT  --  8.3  --   BILITOT  --  0.5  --   ALKPHOS  --  77  --   ALT  --  20  --   AST  --  18  --   GLUCOSE  --  140*  --    utox neg  U/a neg apart from 2.0 protein  CSF gram stain no organism  Crypto ag neg   06/07/2013 15:55   Glucose, CSF  7 (LL)   Total Protein, CSF  >600 (H)   RBC Count, CSF  0   WBC, CSF  86 (HH)   Segmented Neutrophils-CSF  60 (H)   Lymphs, CSF  35 (L)   Monocyte-Macrophage-Spinal Fluid  5 (L)   Eosinophils, CSF  NONE SEEN   Appearance, CSF  CLEAR (A)   Color, CSF  YELLOW (A)   Supernatant  XANTHOCHROMIC   Tube #  1      Imaging/Diagnostic Tests: CT head 06/07/13 IMPRESSION:  1. Hypoattenuation in the left temporal tip appears  parenchymal and  are likely related to ischemia of and an extra-axial arachnoid cyst.  2. Diffuse subcortical white matter hypoattenuation bilaterally is  stable. 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  3. Mild prominence the ventricles bilaterally is likely due to  atrophy. Mild hydrocephalus is considered less likely. Lumbar  puncture may be useful for further evaluation.    Langston Masker, MD 06/08/2013, 7:43 AM PGY-1, [place] Intern pager: [phone], text pages welcome

## 2013-06-08 NOTE — Progress Notes (Signed)
I have seen and examined this patient. I have discussed with Dr Skeet Simmer.  I agree with their findings and plans as documented in their progress note.  [facility]MTS greatly appreciates consultations by Infectious Disease and Neurology Services  Mini-Mental Status Exam Education Level: High School Graduate Orientation time Day 0 Date 0 Month 1 Season 1 Year 0 Orientation location Building 1 Floor  [place]  1 [place]  1 Immediate 3 object Recall 3 Required several trials.  Test limited by patient's difficulty in word discrimination Attention/Concentration "World" spelled backward  0 Language Phrase repeat 1 Naming 2 Following 3 step Directions 3 Read & Obey  1 Write Sentence 1 Copy Design  1  Total 19 out of 30  Clock Draw - Passed.   Instrumental Activities of Daily Living Shopping: Dependent House/Yard work: Dependent Administer Medications: Independent.  Girlfriend supervises. Food Prep: Dependent Finances: Assistance from Girlfriend Transport: Dependent Overall: Dependent  Patient displaying cognitive impairment in orientation and memory domains predominantly.  Some of the lower performance may be due to patient's difficulty in word discrimination and hearing loss.  Patient has loss of functions as evidence by dependence in iADLs.  Patient does express some prolonged sad mood which may indicate role of depression in his cognitive and functional decline.   Plan:  Monitor for progressive cognitive and functional loss characteristic of dementias after treatment for possible reversible causes of dementia.   Assist patient and family in making plans,such as arranging for a durable POA and HCPOA.  Patient's current cognition seems to be adequate for him to assess who would be a reliable agent for his best interests in managing his [place].   Get patient and family in contact with local support NGOs for patients with dementing illnesses such as [place] [facility] or  [employer].

## 2013-06-08 NOTE — Progress Notes (Addendum)
Patient heart rate elevated into 130's (No pacer spikes noted on EKG strips).  Patient sleeping.  Vital signs stable- MD notified.

## 2013-06-08 NOTE — H&P (Signed)
FMTS ATTENDING ADMISSION NOTE Shaun Panameno,MD I  have seen and examined this patient, reviewed their chart. I have discussed this patient with the resident. I agree with the resident's findings, assessment and care plan.  58 Y/O M with PMX of HTN,COPD,heart block s/p pace maker,hearing loss, presented with more than 1 yr hx of worsening,and hand tremors. He denies any recent fall,mo HA,no visual loss,no constitutional symptoms, no GI symptoms. Patient was evaluated for this my the neurologist and found to have + HIV and RPR testing hence was sent to the ED for neurosyphilis evaluation.  No current facility-administered medications on file prior to encounter.   Current Outpatient Prescriptions on File Prior to Encounter  Medication Sig Dispense Refill  . aspirin 81 MG tablet Take 1 tablet (81 mg total) by mouth daily.  180 tablet  2  . lisinopril (PRINIVIL,ZESTRIL) 5 MG tablet Take 1 tablet (5 mg total) by mouth daily.  90 tablet  3  . Nutritional Supplements (ENSURE ACTIVE HIGH PROTEIN) LIQD Take 1 Can by mouth 3 (three) times daily.  60 Can  2  . carbidopa-levodopa (SINEMET IR) 25-100 MG per tablet Take 1 tablet by mouth 3 (three) times daily.  90 tablet  5   Past Medical History  Diagnosis Date  . Hypertension   . Second degree AV block   . Syncope   . Pacemaker -Medtronic     DOI 2012    Filed Vitals:   06/07/13 1700 06/07/13 1745 06/07/13 1900 06/07/13 2100  BP: 129/91 130/85  115/79  Pulse: 67 56  68  Temp:    97.9 F (36.6 C)  TempSrc:    Oral  Resp:    18  Height:   5' 8.11" (1.73 m)   Weight:   115 lb 11.9 oz (52.5 kg)   SpO2: 99% 99%  100%   Exam: Gen: Awake and alert,no in distress. HEENT: Heard of hearing,EOMI,PERRLA. Neuro: Gait not assessed ( Patient seen s/p LP),power across joints 4-5/5,DTR intact. Resp: Air entry equal B/L and clear. CV: S1 S2 normal,no murmur Abd: Benign Ext: No edema Psy: appears depressed but not suicidal.  A/P: 1.Abnormal gait/+HIV  and RPR status:    R/O neurosyphilis in HIV patient    CT head reviewed. (Diffuse subcortical white matter hypoattenuation bilaterally is  stable. 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    CD4 count and virology recommended.    Penicillin for syphilis pending ID assessment.    PT/OP for gait.  2. Depression: Likely related to new diagnosis of HIV.     Patient currently not suicidal     Support by his partner provided.     We will monitor him closely.    Consider psych eval as out patient.  3. HTN: Continue home regimen.

## 2013-06-08 NOTE — Progress Notes (Signed)
UR completed. Patient changed to inpatient- requiring multiple IV antibiotics.

## 2013-06-08 NOTE — Progress Notes (Signed)
INITIAL NUTRITION ASSESSMENT  DOCUMENTATION CODES Per approved criteria  -Non-severe (moderate) malnutrition in the context of chronic illness -Underweight   INTERVENTION: Add Ensure Complete po TID, each supplement provides 350 kcal and 13 grams of protein. Downgrade diet to Dysphagia 3 given poor dentition. Recommend removal of Heart Healthy restrictions. Recommend assistance with meals, if no family present to assist. RD to continue to follow nutrition care plan.  NUTRITION DIAGNOSIS: Increased nutrient needs related to HIV as evidenced by estimated needs.   Goal: Intake to meet >90% of estimated nutrition needs.  Monitor:  weight trends, lab trends, I/O's, PO intake, supplement tolerance  Reason for Assessment: Malnutrition Screening Tool  58 y.o. male  Admitting Dx: gait abnormality  ASSESSMENT: Admitted with gait abnormalities. Work-up reveals syphilis, HIV positive, possible meningitis.  Per chart, girlfriend states that pt has been rapidly declining over the last several months. Has gait abnormalities, memory issues, and HOH. Pt with recent new dx of HIV.  Nutrition hx obtained from girlfriend at bedside. Pt with decreased appetite x several months. The most the patient has ever weighed in his lifetime is approx 125 lb. Pt is currently down to 115 lb, however has been this weight for about 1 year. Per girlfriend, pt was placed on an Ensure regimen, three times daily, approximately 6 months ago when MD was told pt wasn't eating as well. Dietary recall reveals foods such as oatmeal, eggs, sandwiches, meats, etc. Pt will take Ensure, any flavor, three times daily. Girlfriend notes that patient has required assistance with eating 2/2 ongoing neuro issues.  Pt currently ordered for Heart Healthy diet. Approx 10-15 min after eating both breakfast and lunch, pt vomited all of his meal - confirmed with RN. RN notes that pt is on several abx, and that this could be the  culprit.  Pt with very poor dentition - rotting teeth and several missing teeth. Will downgrade diet to Dysphagia 3.  Nutrition Focused Physical Exam:  Subcutaneous Fat:  Orbital Region: WNL Upper Arm Region: moderate depletion Thoracic and Lumbar Region: n/a  Muscle:  Temple Region: moderate to severe depletion Clavicle Bone Region: moderate depletion Clavicle and Acromion Bone Region: moderate depletion Scapular Bone Region: n/a Dorsal Hand: moderate depletion Patellar Region: n/a Anterior Thigh Region: n/a Posterior Calf Region: n/a  Edema: none  Pt meets criteria for moderate MALNUTRITION in the context of chronic illness as evidenced by moderate muscle and fat mass loss.  Sodium low at 136 HgbA1c is 6  Height: Ht Readings from Last 1 Encounters:  06/07/13 5' 8.11" (1.73 m)    Weight: Wt Readings from Last 1 Encounters:  06/07/13 115 lb 11.9 oz (52.5 kg)    Ideal Body Weight: 154 lb  % Ideal Body Weight: 75%  Wt Readings from Last 25 Encounters:  06/07/13 115 lb 11.9 oz (52.5 kg)  06/07/13 115 lb 11.2 oz (52.481 kg)  06/05/13 117 lb (53.071 kg)  05/22/13 119 lb (53.978 kg)  11/23/12 116 lb (52.617 kg)  08/22/12 112 lb (50.803 kg)  08/10/12 114 lb (51.71 kg)  07/15/12 115 lb (52.164 kg)  07/08/12 118 lb (53.524 kg)  05/13/12 119 lb (53.978 kg)  04/20/12 116 lb (52.617 kg)  06/26/10 121 lb 3.2 oz (54.976 kg)  05/27/10 125 lb 6.4 oz (56.881 kg)  04/07/10 126 lb (57.153 kg)  03/05/10 124 lb 8 oz (56.473 kg)     Usual Body Weight: 115 - 120 lb  % Usual Body Weight: 100%  BMI:  Body  mass index is 17.54 kg/(m^2). Underweight  Estimated Nutritional Needs: Kcal: 1550 - 1800 kcal Protein: 60 - 75 g Fluid: 1.6 - 1.8 liters  Skin: intact  Diet Order: Cardiac  EDUCATION NEEDS: -No education needs identified at this time   Intake/Output Summary (Last 24 hours) at 06/08/13 1502 Last data filed at 06/08/13 1300  Gross per 24 hour  Intake    360  ml  Output    100 ml  Net    260 ml    Last BM: 4/5  Labs:   Recent Labs Lab 06/05/13 1033 06/07/13 1010 06/07/13 2025  NA 133* 136*  --   K  --  4.0  --   CL  --  97  --   CO2  --  30  --   BUN  --  12  --   CREATININE  --  0.85 0.68  CALCIUM  --  9.0  --   GLUCOSE  --  140*  --     CBG (last 3)   Recent Labs  06/07/13 1731  GLUCAP 76   Lab Results  Component Value Date   HGBA1C 6.0* 06/05/2013    Scheduled Meds: . acyclovir  10 mg/kg Intravenous 3 times per day  . aspirin  81 mg Oral Daily  . atenolol  25 mg Oral Daily  . heparin  5,000 Units Subcutaneous 3 times per day  . lisinopril  5 mg Oral Daily  . pencillin G potassium IV  4 Million Units Intravenous Q4H  . sodium chloride  3 mL Intravenous Q12H    Continuous Infusions:   Past Medical History  Diagnosis Date  . Hypertension   . Second degree AV block   . Syncope   . Pacemaker -Medtronic     DOI 2012    Past Surgical History  Procedure Laterality Date  . Pacemaker insertion      Inda Coke MS, RD, LDN Inpatient Registered Dietitian Pager: [phone] After-hours pager: [phone]

## 2013-06-08 NOTE — Progress Notes (Signed)
Subjective: No complaints or changes over night. patient is comfortable in his bed eating crackers.   LP results -- Glucose 7, protein >600, yellow, clear, Xanthrochromic, RBC 0, WBC 86, neutrophils 60, lymph 35, mono 5, negative cryptococcal, no growth in preliminary culture, negative gram stain  Varicella IgG elevated at 471, Varicella -Zoster Ab , IgM negative Hep B S ab positive, surface Ag negative Hep C negative  Objective: Current vital signs: BP 149/91  Pulse 70  Temp(Src) 97.8 F (36.6 C) (Oral)  Resp 19  Ht 5' 8.11" (1.73 m)  Wt 52.5 kg (115 lb 11.9 oz)  BMI 17.54 kg/m2  SpO2 100% Vital signs in last 24 hours: Temp:  [97.8 F (36.6 C)-98.6 F (37 C)] 97.8 F (36.6 C) (04/09 1300) Pulse Rate:  [56-72] 70 (04/09 1300) Resp:  [12-20] 19 (04/09 1300) BP: (108-149)/(73-92) 149/91 mmHg (04/09 1300) SpO2:  [98 %-100 %] 100 % (04/09 1300) Weight:  [52.5 kg (115 lb 11.9 oz)] 52.5 kg (115 lb 11.9 oz) (04/08 1900)  Intake/Output from previous day: 04/08 0701 - 04/09 0700 In: -  Out: 100 [Urine:100] Intake/Output this shift: Total I/O In: 360 [P.O.:360] Out: -  Nutritional status: Cardiac  Neurologic Exam: Alert, oriented, thought content appropriate. Speech fluent without evidence of aphasia. Able to follow 3 step commands without difficulty.  Cranial Nerves:  II:  Visual fields grossly normal, pupils equal, round, reactive to light and accommodation  III,IV, VI: ptosis not present, extra-ocular motions intact bilaterally  V,VII: smile symmetric, facial light touch sensation normal bilaterally  VIII: hearing decreased bilaterally  IX,X: gag reflex present  XI: bilateral shoulder shrug  XII: midline tongue extension  Motor:  5/5 throughout Tone and bulk: remains increased tone in both upper extremities. Tremor noted in BUE's, right greater than left  Sensory: Pinprick and light touch intact throughout, bilaterally  Deep Tendon Reflexes: 2+ and symmetric throughout   Plantars:  Right: downgoing   Left: downgoing  Cerebellar:  normal finger-to-nose testing bilaterally. Dysmetria on the right heel to shin test.   Gait: Unable to test  CV: pulses palpable throughout    Lab Results: Basic Metabolic Panel:  Recent Labs Lab 06/05/13 1033 06/07/13 1010 06/07/13 2025  NA 133* 136*  --   K  --  4.0  --   CL  --  97  --   CO2  --  30  --   GLUCOSE  --  140*  --   BUN  --  12  --   CREATININE  --  0.85 0.68  CALCIUM  --  9.0  --     Liver Function Tests:  Recent Labs Lab 06/07/13 1010  AST 18  ALT 20  ALKPHOS 77  BILITOT 0.5  PROT 8.3  ALBUMIN 3.4*   No results found for this basename: LIPASE, AMYLASE,  in the last 168 hours No results found for this basename: AMMONIA,  in the last 168 hours  CBC:  Recent Labs Lab 06/07/13 1010 06/07/13 2025  WBC 4.9 4.3  NEUTROABS 2.3  --   HGB 14.0 12.6*  HCT 39.9 36.4*  MCV 90.1 89.4  PLT 195 193    Cardiac Enzymes:  Recent Labs Lab 06/07/13 1010  TROPONINI <0.30    Lipid Panel: No results found for this basename: CHOL, TRIG, HDL, CHOLHDL, VLDL, LDLCALC,  in the last 168 hours  CBG:  Recent Labs Lab 06/07/13 [place]    Microbiology: Results for orders placed during  the hospital encounter of 06/07/13  GRAM STAIN     Status: None   Collection Time    06/07/13  3:55 PM      Result Value Ref Range Status   Specimen Description CSF   Final   Special Requests 6.0ML FLUID   Final   Gram Stain     Final   Value: CYTOSPIN PREP     WBC PRESENT, PREDOMINANTLY PMN     NO ORGANISMS SEEN   Report Status 06/07/2013 FINAL   Final  CSF CULTURE     Status: None   Collection Time    06/07/13  3:55 PM      Result Value Ref Range Status   Specimen Description CSF   Final   Special Requests 6.0ML CSF FLUID   Final   Gram Stain     Final   Value: WBC PRESENT,BOTH PMN AND MONONUCLEAR     NO ORGANISMS SEEN     Performed at [facility] CYTOSPIN     Performed at  [facility]   Culture     Final   Value: NO GROWTH 1 DAY     Performed at [facility]   Report Status PENDING   Incomplete    Coagulation Studies:  Recent Labs  06/07/13 1010  LABPROT 13.0  INR 1.00    Imaging: Ct Head W Wo Contrast  06/07/2013   CLINICAL DATA:  HIV and syphilis. Recent diagnosis of Parkinson's disease.  EXAM: CT HEAD WITHOUT AND WITH CONTRAST  TECHNIQUE: Contiguous axial images were obtained from the base of the skull through the vertex without and with intravenous contrast  CONTRAST:  39mL OMNIPAQUE IOHEXOL 300 MG/ML  SOLN  COMPARISON:  CT head without contrast 05/25/2013  FINDINGS: The area of hypoattenuation in the anterior left temporal lobe appears to the parenchymal unlikely related to and ischemia. There is no enhancing lesion evident.  Scattered subcortical white matter hypoattenuation is evident bilaterally. Mild prominence the ventricles is likely related to atrophy.  The postcontrast images demonstrate no pathologic enhancement.  The paranasal sinuses and mastoid air cells are clear. The osseous skull is intact.  IMPRESSION: 1. Hypoattenuation in the left temporal tip appears parenchymal and are likely related to ischemia of and an extra-axial arachnoid cyst. 2. Diffuse subcortical white matter hypoattenuation bilaterally is stable. 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 3. Mild prominence the ventricles bilaterally is likely due to atrophy. Mild hydrocephalus is considered less likely. Lumbar puncture may be useful for further evaluation.   Electronically Signed   By: Lawrence Santiago M.D.   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 HIV. Diagnostic lumbar puncture unsuccessful in the emergency department, requested in radiology. Initial encounter.  EXAM: DIAGNOSTIC LUMBAR PUNCTURE UNDER FLUOROSCOPIC GUIDANCE  FLUOROSCOPY TIME:  0 min in 29 seconds.   PROCEDURE: Informed consent was obtained from the patient prior to the procedure, including potential complications of headache, allergy, and pain. A "time-out" was performed.  With the patient prone, the lower back was prepped with Betadine. 1% Lidocaine was used for local anesthesia. Lumbar puncture was performed at the L2-L3 level using a 3.5 inch x 20 gauge needle with return of amber tinted CSF with an opening pressure of 19 cm water. 18 ml of CSF were obtained for laboratory studies. The patient tolerated the procedure well and there were no apparent complications. He was  returned to the emergency department for further evaluation and care.  IMPRESSION: Lumbar puncture at L2-L3 with 18 mL of CSF collected. Normal opening pressure of 19 cm of water.   Electronically Signed   By: Lars Pinks M.D.   On: 06/07/2013 16:23    Medications:  Scheduled: . acyclovir  10 mg/kg Intravenous 3 times per day  . aspirin  81 mg Oral Daily  . atenolol  25 mg Oral Daily  . cefTRIAXone (ROCEPHIN)  IV  2 g Intravenous Q12H  . heparin  5,000 Units Subcutaneous 3 times per day  . lisinopril  5 mg Oral Daily  . pencillin G potassium IV  4 Million Units Intravenous Q4H  . sodium chloride  3 mL Intravenous Q12H  . vancomycin  750 mg Intravenous Q12H    Assessment/Plan: Patient remains stable.  On coverage for neurosyphilis with lab results still pending.  ID following.    Recommendations: 1.  Will continue to follow with you.     LOS: 1 day   Alexis Goodell, MD [facility] [phone]  06/08/2013  2:14 PM

## 2013-06-08 NOTE — Progress Notes (Signed)
    [facility] for Infectious Disease  i will see this patient formally in the am. 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

## 2013-06-08 NOTE — Consult Note (Addendum)
[facility]    Date of Admission:  06/07/2013  Date of Consult:  06/08/2013  Reason for Consult: Meningitis, Neurosyphilis, HIV Referring Physician: Dr. Gwendlyn Deutscher   HPI: Shaun Lutz is an 58 y.o. male who was being worked up by his Neurologist for gait abnromalities that have persisted for several years.  He was finally tested for HIV = POSITIVE and Syphilis POSITIVE WIth RPR of 1:512.   He was admitted to the FMTS and underwent LP which showed a white cell count of 86 with 60% segmented neutrophils 35% lymphocytes. CSF protein was greater than 600 and glucose was in fact 7.  Gram stain was negative for any organism and cryptococcal antigen on CSF was negative (if I can read EPIC correctly)  Appears also that very sella IgG was positive on spinal fluid and IgM was negative. I am not finding difficulty getting in touch with a helpful human being at [facility] to let me know if this antibody test is in fact from CSF or from blood.  CT scan of the head showed:  IMPRESSION:  1. Hypoattenuation in the left temporal tip appears parenchymal and  are likely related to ischemia of and an extra-axial arachnoid cyst.  2. Diffuse subcortical white matter hypoattenuation bilaterally is  stable. 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  3. Mild prominence the ventricles bilaterally is likely due to  atrophy. Mild hydrocephalus is considered less likely. Lumbar  puncture may be useful for further evaluation.     The patient was admitted to FMTS and started on empiric therapy for bacterial meningitis and for HSV/VZV including Neurosyphilis coverage.      Past Medical History  Diagnosis Date  . Hypertension   . Second degree AV block   . Syncope   . Pacemaker -Medtronic     DOI 2012    Past Surgical History  Procedure Laterality Date  . Pacemaker insertion    ergies:   No Known Allergies   Medications: I have reviewed patients current medications as  documented in Epic Anti-infectives   Start     Dose/Rate Route Frequency Ordered Stop   06/08/13 0200  penicillin G potassium 4 Million Units in dextrose 5 % 250 mL IVPB     4 Million Units 250 mL/hr over 60 Minutes Intravenous Every 4 hours 06/08/13 0033     06/07/13 2200  cefTRIAXone (ROCEPHIN) 2 g in dextrose 5 % 50 mL IVPB  Status:  Discontinued     2 g 100 mL/hr over 30 Minutes Intravenous Every 12 hours 06/07/13 1847 06/07/13 1852   06/07/13 2200  cefTRIAXone (ROCEPHIN) 2 g in dextrose 5 % 50 mL IVPB    Comments:  Please draw blood cultures before antibiotics   2 g 100 mL/hr over 30 Minutes Intravenous Every 12 hours 06/07/13 1852     06/07/13 2200  acyclovir (ZOVIRAX) 525 mg in dextrose 5 % 100 mL IVPB     10 mg/kg  52.5 kg 110.5 mL/hr over 60 Minutes Intravenous 3 times per day 06/07/13 1915     06/07/13 2000  vancomycin (VANCOCIN) IVPB 750 mg/150 ml premix     750 mg 150 mL/hr over 60 Minutes Intravenous Every 12 hours 06/07/13 1915     06/07/13 2000  Ampicillin-Sulbactam (UNASYN) 3 g in sodium chloride 0.9 % 100 mL IVPB  Status:  Discontinued     3 g 100 mL/hr over 60 Minutes Intravenous Every 6 hours 06/07/13 1915 06/08/13 0033  Social History:  reports that he has been smoking Cigarettes.  He has a 8.75 pack-year smoking history. He does not have any smokeless tobacco history on file. He reports that he does not drink alcohol or use illicit drugs.  Family History  Problem Relation Age of Onset  . Cancer Mother   . Heart disease Father   . Heart disease Sister   . Heart disease Brother     As in HPI and primary teams notes otherwise 12 point review of systems is negative  Blood pressure 149/91, pulse 70, temperature 97.8 F (36.6 C), temperature source Oral, resp. rate 19, height 5' 8.11" (1.73 m), weight 115 lb 11.9 oz (52.5 kg), SpO2 100.00%. General: Alert and awake, extremely hard of hearing and slow but oriented to person and place and location HEENT:  anicteric sclera, pupils reactive to light and accommodation, EOMI,  CVS regular rate, normal r,  no murmur rubs or gallops Chest: clear to auscultation bilaterally, no wheezing, rales or rhonchi Abdomen: soft nontender, nondistended, normal bowel sounds, Extremities: no  clubbing or edema noted bilaterally Skin: The like go along with hyperpigmented lesions seen below and pictures Neuro: ? Clonus but very subtle otherwise nonfocal            Results for orders placed during the hospital encounter of 06/07/13 (from the past 48 hour(s))  CBC WITH DIFFERENTIAL     Status: Abnormal   Collection Time    06/07/13 10:10 AM      Result Value Ref Range   WBC 4.9  4.0 - 10.5 K/uL   RBC 4.43  4.22 - 5.81 MIL/uL   Hemoglobin 14.0  13.0 - 17.0 g/dL   HCT 39.9  39.0 - 52.0 %   MCV 90.1  78.0 - 100.0 fL   MCH 31.6  26.0 - 34.0 pg   MCHC 35.1  30.0 - 36.0 g/dL   RDW 11.5  11.5 - 15.5 %   Platelets 195  150 - 400 K/uL   Neutrophils Relative % 48  43 - 77 %   Neutro Abs 2.3  1.7 - 7.7 K/uL   Lymphocytes Relative 24  12 - 46 %   Lymphs Abs 1.2  0.7 - 4.0 K/uL   Monocytes Relative 9  3 - 12 %   Monocytes Absolute 0.5  0.1 - 1.0 K/uL   Eosinophils Relative 18 (*) 0 - 5 %   Eosinophils Absolute 0.9 (*) 0.0 - 0.7 K/uL   Basophils Relative 1  0 - 1 %   Basophils Absolute 0.1  0.0 - 0.1 K/uL  COMPREHENSIVE METABOLIC PANEL     Status: Abnormal   Collection Time    06/07/13 10:10 AM      Result Value Ref Range   Sodium 136 (*) 137 - 147 mEq/L   Potassium 4.0  3.7 - 5.3 mEq/L   Chloride 97  96 - 112 mEq/L   CO2 30  19 - 32 mEq/L   Glucose, Bld 140 (*) 70 - 99 mg/dL   BUN 12  6 - 23 mg/dL   Creatinine, Ser 0.85  0.50 - 1.35 mg/dL   Calcium 9.0  8.4 - 10.5 mg/dL   Total Protein 8.3  6.0 - 8.3 g/dL   Albumin 3.4 (*) 3.5 - 5.2 g/dL   AST 18  0 - 37 U/L   ALT 20  0 - 53 U/L   Alkaline Phosphatase 77  39 - 117 U/L   Total Bilirubin 0.5  0.3 - 1.2 mg/dL   GFR calc non Af Amer >90  >90  mL/min   GFR calc Af Amer >90  >90 mL/min   Comment: (NOTE)     The eGFR has been calculated using the CKD EPI equation.     This calculation has not been validated in all clinical situations.     eGFR's persistently <90 mL/min signify possible Chronic Kidney     Disease.  PROTIME-INR     Status: None   Collection Time    06/07/13 10:10 AM      Result Value Ref Range   Prothrombin Time 13.0  11.6 - 15.2 seconds   INR 1.00  0.00 - 1.49  TROPONIN I     Status: None   Collection Time    06/07/13 10:10 AM      Result Value Ref Range   Troponin I <0.30  <0.30 ng/mL   Comment:            Due to the release kinetics of cTnI,     a negative result within the first hours     of the onset of symptoms does not rule out     myocardial infarction with certainty.     If myocardial infarction is still suspected,     repeat the test at appropriate intervals.  CD4/CD8 (T-HELPER/T-SUPPRESSOR CELL)     Status: Abnormal   Collection Time    06/07/13 10:10 AM      Result Value Ref Range   Total lymphocyte count 1270  1000 - 4000 /uL   CD4% 20 (*) 30.0 - 60.0 %   CD4 absolute 250 (*) 500 - 1900 /uL   CD8tox 73 (*) 15.0 - 40.0 %   CD8 T Cell Abs 920  230 - 1000 /uL   Ratio 0.27 (*) 1.0 - 3.0   Comment: Performed at [facility]     Status: None   Collection Time    06/07/13 12:30 PM      Result Value Ref Range   Opiates NONE DETECTED  NONE DETECTED   Cocaine NONE DETECTED  NONE DETECTED   Benzodiazepines NONE DETECTED  NONE DETECTED   Amphetamines NONE DETECTED  NONE DETECTED   Tetrahydrocannabinol NONE DETECTED  NONE DETECTED   Barbiturates NONE DETECTED  NONE DETECTED   Comment:            DRUG SCREEN FOR MEDICAL PURPOSES     ONLY.  IF CONFIRMATION IS NEEDED     FOR ANY PURPOSE, NOTIFY LAB     WITHIN 5 DAYS.                LOWEST DETECTABLE LIMITS     FOR URINE DRUG SCREEN     Drug Class       Cutoff (ng/mL)     Amphetamine       1000     Barbiturate      200     Benzodiazepine   884     Tricyclics       166     Opiates          300     Cocaine          300     THC              50  URINALYSIS, ROUTINE W REFLEX MICROSCOPIC     Status: Abnormal   Collection Time  06/07/13 12:30 PM      Result Value Ref Range   Color, Urine YELLOW  YELLOW   APPearance CLEAR  CLEAR   Specific Gravity, Urine 1.010  1.005 - 1.030   pH 7.5  5.0 - 8.0   Glucose, UA NEGATIVE  NEGATIVE mg/dL   Hgb urine dipstick NEGATIVE  NEGATIVE   Bilirubin Urine NEGATIVE  NEGATIVE   Ketones, ur NEGATIVE  NEGATIVE mg/dL   Protein, ur NEGATIVE  NEGATIVE mg/dL   Urobilinogen, UA 2.0 (*) 0.0 - 1.0 mg/dL   Nitrite NEGATIVE  NEGATIVE   Leukocytes, UA NEGATIVE  NEGATIVE   Comment: MICROSCOPIC NOT DONE ON URINES WITH NEGATIVE PROTEIN, BLOOD, LEUKOCYTES, NITRITE, OR GLUCOSE <1000 mg/dL.  CSF CELL COUNT WITH DIFFERENTIAL     Status: Abnormal   Collection Time    06/07/13  3:55 PM      Result Value Ref Range   Tube # 1     Color, CSF YELLOW (*) COLORLESS   Appearance, CSF CLEAR (*) CLEAR   Supernatant XANTHOCHROMIC     RBC Count, CSF 0  0 /cu mm   WBC, CSF 86 (*) 0 - 5 /cu mm   Comment: CRITICAL RESULT CALLED TO, READ BACK BY AND VERIFIED WITH:     Revonda Humphrey RN 1720 06/07/13 A BROWNING   Segmented Neutrophils-CSF 60 (*) 0 - 6 %   Lymphs, CSF 35 (*) 40 - 80 %   Monocyte-Macrophage-Spinal Fluid 5 (*) 15 - 45 %   Eosinophils, CSF NONE SEEN  0 - 1 %  PROTEIN AND GLUCOSE, CSF     Status: Abnormal   Collection Time    06/07/13  3:55 PM      Result Value Ref Range   Glucose, CSF 7 (*) 43 - 76 mg/dL   Comment: CRITICAL RESULT CALLED TO, READ BACK BY AND VERIFIED WITH:     B CHANDLER,RN 1726 06/07/13 D BRADLEY   Total  Protein, CSF >600 (*) 15 - 45 mg/dL  GRAM STAIN     Status: None   Collection Time    06/07/13  3:55 PM      Result Value Ref Range   Specimen Description CSF     Special Requests 6.0ML FLUID     Gram Stain       Value: CYTOSPIN  PREP     WBC PRESENT, PREDOMINANTLY PMN     NO ORGANISMS SEEN   Report Status 06/07/2013 FINAL    CRYPTOCOCCAL ANTIGEN, CSF     Status: None   Collection Time    06/07/13  3:55 PM      Result Value Ref Range   Crypto Ag NEGATIVE  NEGATIVE   Cryptococcal Ag Titer NOT INDICATED  NOT INDICATED  CSF CULTURE     Status: None   Collection Time    06/07/13  3:55 PM      Result Value Ref Range   Specimen Description CSF     Special Requests 6.0ML CSF FLUID     Gram Stain       Value: WBC PRESENT,BOTH PMN AND MONONUCLEAR     NO ORGANISMS SEEN     Performed at [facility]     Performed at [facility]   Culture       Value: NO GROWTH 1 DAY     Performed at [facility]   Report Status PENDING    PATHOLOGIST SMEAR REVIEW     Status: None  Collection Time    06/07/13  3:55 PM      Result Value Ref Range   Path Review MIXED PLEOCYTOSIS.     Comment: Reviewed by Chrystie Nose. Saralyn Pilar, M.D.     06/08/13.  CBG MONITORING, ED     Status: None   Collection Time    06/07/13  5:31 PM      Result Value Ref Range   Glucose-Capillary 76  70 - 99 mg/dL  CBC     Status: Abnormal   Collection Time    06/07/13  8:25 PM      Result Value Ref Range   WBC 4.3  4.0 - 10.5 K/uL   RBC 4.07 (*) 4.22 - 5.81 MIL/uL   Hemoglobin 12.6 (*) 13.0 - 17.0 g/dL   HCT 36.4 (*) 39.0 - 52.0 %   MCV 89.4  78.0 - 100.0 fL   MCH 31.0  26.0 - 34.0 pg   MCHC 34.6  30.0 - 36.0 g/dL   RDW 11.5  11.5 - 15.5 %   Platelets 193  150 - 400 K/uL  CREATININE, SERUM     Status: None   Collection Time    06/07/13  8:25 PM      Result Value Ref Range   Creatinine, Ser 0.68  0.50 - 1.35 mg/dL   GFR calc non Af Amer >90  >90 mL/min   GFR calc Af Amer >90  >90 mL/min   Comment: (NOTE)     The eGFR has been calculated using the CKD EPI equation.     This calculation has not been validated in all clinical situations.     eGFR's persistently <90 mL/min signify possible Chronic Kidney     Disease.    CRYPTOCOCCAL ANTIGEN     Status: None   Collection Time    06/07/13  8:25 PM      Result Value Ref Range   Crypto Ag NEGATIVE  NEGATIVE   Cryptococcal Ag Titer NOT INDICATED  NOT INDICATED  VARICELLA ZOSTER ANTIBODY, IGG     Status: Abnormal   Collection Time    06/07/13  8:25 PM      Result Value Ref Range   Varicella IgG 471.00 (*) <135.00 Index   Comment: (NOTE)     Reference Range:       <135.00 Index = Negative                     135.00-164.99 Index = Equivocal                          >=165.00 Index = Positive     Performed at [facility], IGM     Status: None   Collection Time    06/07/13  8:25 PM      Result Value Ref Range   Varicella-Zoster Ab, IgM 0.02  <0.91 ISR   Comment: (NOTE)       ISR = Immune Status Ratio                 <0.91         ISR       Negative                 0.91 - 1.09   ISR       Equivocal                 >=1.10  ISR       Positive     Results from any one IgM assay should not be used as the sole     determinant of a current or recent infection. Because an IgM test can     yield false positive results, and low levels of IgM antibody may     persist for more than 12 months post infection, reliance on a single     test result could be misleading. If an acute infection is suspected,     consider obtaining a new specimen and submit for both IgG and IgM     testing in two or more weeks.     Performed at [facility] ANTIBODY     Status: Abnormal   Collection Time    06/07/13  8:25 PM      Result Value Ref Range   Hep B S Ab POSITIVE (*) NEGATIVE   Comment: Performed at [facility]     Status: None   Collection Time    06/07/13  8:25 PM      Result Value Ref Range   Hepatitis B Surface Ag NEGATIVE  NEGATIVE   Comment: Performed at [facility] [facility]     Status: None   Collection Time    06/07/13  8:25 PM       Result Value Ref Range   HCV Ab NEGATIVE  NEGATIVE   Comment: Performed at [facility]      Component Value Date/Time   SDES CSF 06/07/2013 1555   SDES CSF 06/07/2013 1555   SPECREQUEST 6.0ML FLUID 06/07/2013 1555   SPECREQUEST 6.0ML CSF FLUID 06/07/2013 1555   CULT  Value: NO GROWTH 1 DAY Performed at [facility] 06/07/2013 1555   REPTSTATUS 06/07/2013 FINAL 06/07/2013 1555   REPTSTATUS PENDING 06/07/2013 1555   Ct Head W Wo Contrast  06/07/2013   CLINICAL DATA:  HIV and syphilis. Recent diagnosis of Parkinson's disease.  EXAM: CT HEAD WITHOUT AND WITH CONTRAST  TECHNIQUE: Contiguous axial images were obtained from the base of the skull through the vertex without and with intravenous contrast  CONTRAST:  85m OMNIPAQUE IOHEXOL 300 MG/ML  SOLN  COMPARISON:  CT head without contrast 05/25/2013  FINDINGS: The area of hypoattenuation in the anterior left temporal lobe appears to the parenchymal unlikely related to and ischemia. There is no enhancing lesion evident.  Scattered subcortical white matter hypoattenuation is evident bilaterally. Mild prominence the ventricles is likely related to atrophy.  The postcontrast images demonstrate no pathologic enhancement.  The paranasal sinuses and mastoid air cells are clear. The osseous skull is intact.  IMPRESSION: 1. Hypoattenuation in the left temporal tip appears parenchymal and are likely related to ischemia of and an extra-axial arachnoid cyst. 2. Diffuse subcortical white matter hypoattenuation bilaterally is stable. 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 3. Mild prominence the ventricles bilaterally is likely due to atrophy. Mild hydrocephalus is considered less likely. Lumbar puncture may be useful for further evaluation.   Electronically Signed   By: CLawrence SantiagoM.D.   On: 06/07/2013 11:32   Dg Fluoro Guide Lumbar Puncture  06/07/2013   CLINICAL DATA:  58year old male with suspicion of neurosyphilis. HIV. Diagnostic lumbar puncture  unsuccessful in the emergency department, requested in radiology. Initial encounter.  EXAM: DIAGNOSTIC LUMBAR PUNCTURE UNDER FLUOROSCOPIC GUIDANCE  FLUOROSCOPY TIME:  0 min in 29 seconds.  PROCEDURE:  Informed consent was obtained from the patient prior to the procedure, including potential complications of headache, allergy, and pain. A "time-out" was performed.  With the patient prone, the lower back was prepped with Betadine. 1% Lidocaine was used for local anesthesia. Lumbar puncture was performed at the L2-L3 level using a 3.5 inch x 20 gauge needle with return of amber tinted CSF with an opening pressure of 19 cm water. 18 ml of CSF were obtained for laboratory studies. The patient tolerated the procedure well and there were no apparent complications. He was returned to the emergency department for further evaluation and care.  IMPRESSION: Lumbar puncture at L2-L3 with 18 mL of CSF collected. Normal opening pressure of 19 cm of water.   Electronically Signed   By: Lars Pinks M.D.   On: 06/07/2013 16:23     Recent Results (from the past 720 hour(s))  GRAM STAIN     Status: None   Collection Time    06/07/13  3:55 PM      Result Value Ref Range Status   Specimen Description CSF   Final   Special Requests 6.0ML FLUID   Final   Gram Stain     Final   Value: CYTOSPIN PREP     WBC PRESENT, PREDOMINANTLY PMN     NO ORGANISMS SEEN   Report Status 06/07/2013 FINAL   Final  CSF CULTURE     Status: None   Collection Time    06/07/13  3:55 PM      Result Value Ref Range Status   Specimen Description CSF   Final   Special Requests 6.0ML CSF FLUID   Final   Gram Stain     Final   Value: WBC PRESENT,BOTH PMN AND MONONUCLEAR     NO ORGANISMS SEEN     Performed at [facility] CYTOSPIN     Performed at [facility]   Culture     Final   Value: NO GROWTH 1 DAY     Performed at [facility]   Report Status PENDING   Incomplete     Impression/Recommendation  Active  Problems:   ATRIOVENTRICULAR BLOCK, MOBITZ TYPE II   Pacemaker- medtronic   Abnormality of gait   Human immunodeficiency virus (HIV) disease   Lower extremity weakness   Syphilis   Shaun Lutz is a 58 y.o. male with  COPD, Pacemaker (medtronic 2012) frequent falls gait abnormality found to have HIV and syphilis with a high titer of 1-512, status post CT imaging of the brain a lumbar puncture with high white count extremely low glucose and very high protein.  #1 meningitis: Certainly his spinal fluid profile could be consistent with neurosyphilis though I have never seen a CSF glucose as low as his with neurosyphilis. I reviewed the images with neuroradiology and Caryn Section and he felt that some of the findings in the temporal lobe could be consistent with neurosyphilis as well.  We're waiting further CSF studies including CSF VDRL but the patient already meets the diagnostic criteria for neurosyphilis with a high white count and high RPR.  Certainly he may have several other things gone as well. Additionally had varicella-zoster IgM is positive and if this is in fact from his spinal fluid could be consistent with a VZV vasculopathy.  Again though the low glucose and high protein would not all be typical for varicella-zoster vasculopathy.  I'm concerned that he might have a second or third process namely a possible  leptomeningeal carcinomatosis or tuberculosis meningitis going on given how low the sugar in the CSF is.  He certainly does not have pneumococcal meningitis or H. influenza meningitis with a CSF parameters. I suppose lysed area remains a possibility.  Finally other fungal etiologies should be considered such as histoplasmosis or blastomycosis. His travel history is limited to the [facility] to beyond the coc[place] which was negative on spinal fluid histoplasmosis and Blastomyces there are no other an endemic fungi that would be in the differential.  Toxoplasmosis would be another  consideration although he has no ring-enhancing lesions on CNS.  Again other viral etiologies could be considered but the extraordinary low glucose in CSF and high protein would not be consistent with a viral meningoencephalitis.  --I. will order a CT scan of his spine to assess for parameningeal focus of infection and potential for spinal fluid outflow obstruction.  --I. think he should undergo a repeat lumbar puncture tomorrow with:  --opening pressure measured and high volume CSF removed with 36-68m removed  --with CSF for repeat cell count and differential --CSF protein and glucose  AND   --I would then send dedicated 8-150mto Micro for AFB culture   ----I would then send dedicated 8-1015mo MicrFUNGAL culture  ---dedicated 8ml70m so to pathology and cytology to look for tumor  IF micro spins down CSF and cultures the sediment they can save supernatant if need be for further testing such as for MTB by PCR and other fungal serologies  (I would NOT order these yet as the last patient I attempted this on wasted the high volume of CSF that was collected and made getting things done even more complicated)  --IN the interim I would continue high dose PCN, acyclovir  --I will dc vancomycin and ceftriaxone  #2 HIV/AIDS: he will need HIV ultra quant and HIV integrase genotype, CD4 checked  ---we should NOT start ARV until we understand what kind of CNS pathology we are dealing with as there would be a high risk of immune reconstitution inflammatory syndrome with for example tuberculosis meningitis.  --I discussed idea of MRI with Dr KleiCaryl Comes he believed that it could be done with patients device but it would require us hKoreaing discussion with Dr ShogMaree Erie would require him spending time changing setting on the device  I spent greater than 60 minutes with the patient including greater than 50% of time in face to face counsel of the patient and in coordination of their  care.    06/08/2013, 2:34 PM   Thank you so much for this interesting consult  [place] InfeMayaguez-[phone]) [phone]) 06/08/2013, 2:34 PM  [place]/2015, 2:34 PM

## 2013-06-09 ENCOUNTER — Inpatient Hospital Stay (HOSPITAL_COMMUNITY): Payer: Medicare Other

## 2013-06-09 DIAGNOSIS — R636 Underweight: Secondary | ICD-10-CM

## 2013-06-09 DIAGNOSIS — R0609 Other forms of dyspnea: Secondary | ICD-10-CM

## 2013-06-09 DIAGNOSIS — R269 Unspecified abnormalities of gait and mobility: Secondary | ICD-10-CM | POA: Diagnosis not present

## 2013-06-09 DIAGNOSIS — A523 Neurosyphilis, unspecified: Secondary | ICD-10-CM | POA: Diagnosis not present

## 2013-06-09 DIAGNOSIS — R0989 Other specified symptoms and signs involving the circulatory and respiratory systems: Secondary | ICD-10-CM

## 2013-06-09 DIAGNOSIS — R5381 Other malaise: Secondary | ICD-10-CM | POA: Diagnosis not present

## 2013-06-09 DIAGNOSIS — G039 Meningitis, unspecified: Secondary | ICD-10-CM | POA: Diagnosis not present

## 2013-06-09 DIAGNOSIS — R839 Unspecified abnormal finding in cerebrospinal fluid: Secondary | ICD-10-CM | POA: Diagnosis not present

## 2013-06-09 DIAGNOSIS — A539 Syphilis, unspecified: Secondary | ICD-10-CM | POA: Diagnosis not present

## 2013-06-09 DIAGNOSIS — Z950 Presence of cardiac pacemaker: Secondary | ICD-10-CM | POA: Diagnosis not present

## 2013-06-09 DIAGNOSIS — B20 Human immunodeficiency virus [HIV] disease: Secondary | ICD-10-CM | POA: Diagnosis not present

## 2013-06-09 DIAGNOSIS — A879 Viral meningitis, unspecified: Secondary | ICD-10-CM | POA: Diagnosis not present

## 2013-06-09 LAB — CSF CELL COUNT WITH DIFFERENTIAL
LYMPHS CSF: 9 % — AB (ref 40–80)
Monocyte-Macrophage-Spinal Fluid: 9 % — ABNORMAL LOW (ref 15–45)
RBC Count, CSF: 113 /mm3 — ABNORMAL HIGH
SEGMENTED NEUTROPHILS-CSF: 82 % — AB (ref 0–6)
Tube #: 3
WBC, CSF: 98 /mm3 (ref 0–5)

## 2013-06-09 LAB — PROTEIN AND GLUCOSE, CSF
Glucose, CSF: 20 mg/dL — CL (ref 43–76)
TOTAL PROTEIN, CSF: 598 mg/dL — AB (ref 15–45)

## 2013-06-09 MED ORDER — SERTRALINE HCL 25 MG PO TABS
25.0000 mg | ORAL_TABLET | Freq: Every day | ORAL | Status: DC
  Administered 2013-06-09: 25 mg via ORAL
  Filled 2013-06-09 (×2): qty 1

## 2013-06-09 NOTE — Progress Notes (Signed)
Family Medicine Teaching Service Daily Progress Note Intern Pager: [phone]  Patient name: Shaun Lutz Medical record number: [id number] Date of birth: 1955-06-10 Age: 58 y.o. Gender: male  Primary Care Provider: Donnamae Jude, MD Consultants: ID, neuro Code Status: full  Pt Overview and Major Events to Date:   Assessment and Plan: Tymeer Vaquera is a 58 y.o. 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 May be multifactorial. No evidence of spinal outflow obstruction by CT spine yesterday. CD4 count 250. Will await diagnostics prior to starting any antiretro virals. Plan to repeat LP today -f/up LP results (CSF culture, protein/glucose, TB by PCR, lymphocyte flow, HSV by PCR, fungal with smear, cytology, cell count, AFB with smear)  -ID following greatly appreciate recs and management -covering with penG, acyclovir will narrow as clinically appropriate  -2D echo   #Neuro- Gait abnormality, hearing loss, CNS infection, cogwheel rigidity  Ddx includes sequelae of HIV/syphilis vs parkinsonian features vs demyelinating d/o vs NPH vs vitamin deficiency vs metabolic abnormality. Likely patient having features related to progression of infectious disease (see above). However may also have component of parkisons given dysmetria, memory loss, rigidity. 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 Bullous changes seen on CT chest with bilat infiltrates -no prescriptions for COPD currently  -vitals per floor protocol, will cont to monitor with pulse ox   #Psych/Social Suicidal ideation- in light of recent diagnosis, denies SI currently and states hasnt had any SI in a week; girlfriend feels he is severely depressed - no 1:1 at this time, but will continually reassess  - would start ssri zoloft this evening  #FEN/GI: HHD, replete electrolytes prn  Disposition: further w/up and treatment of unknown infectious etiology vs malignancy vs neurodegenerative d/o   Subjective: Pt more alert and awake this morning, states that he feels better but really doesn't know; very emotional, interested in making girlfriend HCPOA   Objective: Temp:  [97.7 F (36.5 C)-98.6 F (37 C)] 97.7 F (36.5 C) (04/10 0504) Pulse Rate:  [61-74] 65 (04/10 0504) Resp:  [16-19] 18 (04/10 0504) BP: (103-149)/(64-91) 103/64 mmHg (04/10 0504) SpO2:  [97 %-100 %] 97 % (04/10 0504) Weight:  [118 lb 8 oz (53.75 kg)] 118 lb 8 oz (53.75 kg) (04/09 2051) Physical Exam: Gen: NAD,confused, altered, cooperative with exam  HEENT: NCAT, EOMI with horizontal nystagmus, PER, pinpoint and minimally responsive to light; poor dentition; no palatal lesions, white discoloration of tongue  Neck: FROM, supple, no rigidity noted (but not formally assessed)  CV: irreg ireg (intermittently bradycardic), good S1/S2, no murmur, cap refill <3  Resp: CTABL, no wheezes, non-labored  Abd: thin, scaphoid, SNTND, BS present, no guarding or  organomegaly  Ext: thin extremities, warm, normal tone,  difficulty moving upper and lower extremities 3/5 strength on bilat hand grip, 2-3/5 on bilat hip flexion  Neuro: oriented only to self, month, thought it was 1915; tremulous upon gripping hand rails of bed, FTN with dysmetria, hearing loss but CNii-xii otherwise intact; no clonus or hyperreflexia  Skin: vitiligo, multiple hyperpigmented macules throughout entire body including palms and soles; excoriations   Laboratory:  Recent Labs Lab 06/07/13 1010 06/07/13 2025  WBC 4.9 4.3  HGB 14.0 12.6*  HCT 39.9 36.4*  PLT 195 193    Recent Labs Lab 06/05/13 1033 06/07/13 1010 06/07/13 2025  NA 133* 136*  --   K  --  4.0  --   CL  --  97  --   CO2  --  30  --   BUN  --  12  --   CREATININE  --  0.85 0.68  CALCIUM  --  9.0  --   PROT  --  8.3  --   BILITOT  --  0.5  --   ALKPHOS  --  77  --   ALT  --  20  --   AST  --  18  --   GLUCOSE  --  140*  --    utox neg  U/a neg apart from 2.0 protein  CSF gram stain no organism  Crypto ag neg   06/07/2013 15:55   Glucose, CSF  7 (LL)   Total Protein, CSF  >600 (H)   RBC Count, CSF  0   WBC, CSF  86 (HH)   Segmented Neutrophils-CSF  60 (H)   Lymphs, CSF  35 (L)   Monocyte-Macrophage-Spinal Fluid  5 (L)   Eosinophils, CSF  NONE SEEN   Appearance, CSF  CLEAR (A)   Color, CSF  YELLOW (A)   Supernatant  XANTHOCHROMIC   Tube #  1      Imaging/Diagnostic Tests: CT head 06/07/13 IMPRESSION:  1. Hypoattenuation in the left temporal tip appears parenchymal and  are likely related to ischemia of and an extra-axial arachnoid cyst.  2. Diffuse subcortical white matter hypoattenuation bilaterally is  stable. 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  3. Mild prominence the ventricles bilaterally is likely due to  atrophy. Mild hydrocephalus is considered less likely. Lumbar  puncture may be useful for further evaluation.   CT cervical spine 06/08/13 IMPRESSION:  1. No significant para meningeal focus of infection is identified.  2.  Multilevel degenerative cervical spine disease as described.  3. Stable vertebral body heights and alignment.  4. Hypodense lesion within the right parotid gland measures 10 x 15  mm. This could represent a hyperdense lymph node associated with  HIV. However, no other significant adenopathy is present. A primary  parotid lesion is also considered.  5. Inguinal lymph nodes bilaterally are likely related to HIV.  6. Bilateral pleural effusions. Infection is not excluded.  7. Mass like area dependently in the left lower lobe likely  represents rounded atelectasis or potentially some fluid trapped  within the fissure.  8. 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   Langston Masker, MD 06/09/2013, 8:17 AM PGY-1, [facility] Intern pager: [phone], text pages welcome

## 2013-06-09 NOTE — Progress Notes (Signed)
Echo Lab  2D Echocardiogram completed.  [place], RDCS 06/09/2013 11:27 AM

## 2013-06-09 NOTE — Progress Notes (Signed)
[facility]  Day # 3 penicillin Day # 3 acyclovir   Subjective: Feels better  Antibiotics:  Anti-infectives   Start     Dose/Rate Route Frequency Ordered Stop   06/08/13 0200  penicillin G potassium 4 Million Units in dextrose 5 % 250 mL IVPB     4 Million Units 250 mL/hr over 60 Minutes Intravenous Every 4 hours 06/08/13 0033     06/07/13 2200  cefTRIAXone (ROCEPHIN) 2 g in dextrose 5 % 50 mL IVPB  Status:  Discontinued     2 g 100 mL/hr over 30 Minutes Intravenous Every 12 hours 06/07/13 1847 06/07/13 1852   06/07/13 2200  cefTRIAXone (ROCEPHIN) 2 g in dextrose 5 % 50 mL IVPB  Status:  Discontinued    Comments:  Please draw blood cultures before antibiotics   2 g 100 mL/hr over 30 Minutes Intravenous Every 12 hours 06/07/13 1852 06/08/13 1459   06/07/13 2200  acyclovir (ZOVIRAX) 525 mg in dextrose 5 % 100 mL IVPB  Status:  Discontinued     10 mg/kg  52.5 kg 110.5 mL/hr over 60 Minutes Intravenous 3 times per day 06/07/13 1915 06/09/13 1041   06/07/13 2000  vancomycin (VANCOCIN) IVPB 750 mg/150 ml premix  Status:  Discontinued     750 mg 150 mL/hr over 60 Minutes Intravenous Every 12 hours 06/07/13 1915 06/08/13 1459   06/07/13 2000  Ampicillin-Sulbactam (UNASYN) 3 g in sodium chloride 0.9 % 100 mL IVPB  Status:  Discontinued     3 g 100 mL/hr over 60 Minutes Intravenous Every 6 hours 06/07/13 1915 06/08/13 0033      Medications: Scheduled Meds: . aspirin  81 mg Oral Daily  . atenolol  25 mg Oral Daily  . feeding supplement (ENSURE COMPLETE)  237 mL Oral TID  . heparin  5,000 Units Subcutaneous 3 times per day  . lisinopril  5 mg Oral Daily  . pencillin G potassium IV  4 Million Units Intravenous Q4H  . sertraline  25 mg Oral QHS  . sodium chloride  3 mL Intravenous Q12H   Continuous Infusions:  PRN Meds:.    Objective: Weight change: 2 lb 12.1 oz (1.25 kg)  Intake/Output Summary (Last 24 hours) at 06/09/13 1915 Last data filed at  06/09/13 1300  Gross per 24 hour  Intake   1430 ml  Output   1000 ml  Net    430 ml   Blood pressure 115/74, pulse 63, temperature 97.2 F (36.2 C), temperature source Oral, resp. rate 18, height 5' 8.11" (1.73 m), weight 118 lb 8 oz (53.75 kg), SpO2 99.00%. Temp:  [97.2 F (36.2 C)-98.3 F (36.8 C)] 97.2 F (36.2 C) (04/10 1740) Pulse Rate:  [61-66] 63 (04/10 1740) Resp:  [18] 18 (04/10 1740) BP: (103-124)/(64-84) 115/74 mmHg (04/10 1740) SpO2:  [97 %-99 %] 99 % (04/10 1740) Weight:  [118 lb 8 oz (53.75 kg)] 118 lb 8 oz (53.75 kg) (04/09 2051)  Physical Exam: AO x 3 not nearly as HOH as yesterday HEENT: anicteric sclera, pupils reactive to light and accommodation, EOMI,  CVS regular rate, normal r, no murmur rubs or gallops  Chest: clear to auscultation bilaterally, no wheezing, rales or rhonchi  Abdomen: soft nontender, nondistended, normal bowel sounds,  Extremities: no clubbing or edema noted bilaterally  Skin: The like go along with hyperpigmented lesions seen below and pictures  Neuro:  nonfocal  CBC:  Recent Labs Lab 06/07/13 1010 06/07/13 2025  HGB 14.0 12.6*  HCT 39.9 36.4*  PLT 195 193  INR 1.00  --      BMET  Recent Labs  06/07/13 1010 06/07/13 2025  NA 136*  --   K 4.0  --   CL 97  --   CO2 30  --   GLUCOSE 140*  --   BUN 12  --   CREATININE 0.85 0.68  CALCIUM 9.0  --      Liver Panel   Recent Labs  06/07/13 1010  PROT 8.3  ALBUMIN 3.4*  AST 18  ALT 20  ALKPHOS 77  BILITOT 0.5       Sedimentation Rate No results found for this basename: ESRSEDRATE,  in the last 72 hours C-Reactive Protein No results found for this basename: CRP,  in the last 72 hours  Micro Results: Recent Results (from the past 240 hour(s))  GRAM STAIN     Status: None   Collection Time    06/07/13  3:55 PM      Result Value Ref Range Status   Specimen Description CSF   Final   Special Requests 6.0ML FLUID   Final   Gram Stain     Final   Value:  CYTOSPIN PREP     WBC PRESENT, PREDOMINANTLY PMN     NO ORGANISMS SEEN   Report Status 06/07/2013 FINAL   Final  CSF CULTURE     Status: None   Collection Time    06/07/13  3:55 PM      Result Value Ref Range Status   Specimen Description CSF   Final   Special Requests 6.0ML CSF FLUID   Final   Gram Stain     Final   Value: WBC PRESENT,BOTH PMN AND MONONUCLEAR     NO ORGANISMS SEEN     Performed at [facility] CYTOSPIN     Performed at [facility]   Culture     Final   Value: NO GROWTH 2 DAYS     Performed at [facility]   Report Status PENDING   Incomplete    Studies/Results: Ct Cervical Spine W Contrast  06/08/2013   CLINICAL DATA:  HIV and neurosyphilis. Abnormal CSF. Evaluate for para meningeal focus of infection or spinal fluid outflow obstruction.  EXAM: CT CERVICAL, THORACIC, AND LUMBAR SPINE WITHOUT CONTRAST  TECHNIQUE: Multidetector CT imaging of the cervical, lumbar, and thoracic spine was performed with intravenous contrast. Multiplanar CT image reconstructions were also generated.  CONTRAST:  116mL OMNIPAQUE IOHEXOL 300 MG/ML  SOLN  COMPARISON:  CT head without and with contrast 06/07/2013  FINDINGS: CT CERVICAL SPINE FINDINGS  No significant pathologic enhancement or soft tissue mass is present to suggest infection. The craniocervical junction is within normal limits.  A shallow central disc protrusion is present at C2-3 without significant stenosis.  C3-4: A prominent soft disc protrusion effaces the ventral CSF. The foramina are patent.  C4-5: A leftward disc protrusion is present with effacement of the ventral CSF. The foramina are patent.  C5-6: Mild uncovertebral spurring is present. A broad-based disc osteophyte complex partially effaces the ventral CSF.  C6-7: Asymmetric leftward uncovertebral spurring is associated with a disc osteophyte complex. Is partially effaces the ventral CSF. Left foraminal narrowing is evident.  C7-T1:  Negative.  The soft  tissues of the neck demonstrate a low-density and noted along the medial aspect of the right parotid, potentially within the deep lobe measuring 10 x 15 mm. No other  significant cervical adenopathy is present.  CT THORACIC SPINE FINDINGS  Bilateral pleural effusions and pleural nodularity is present. A mass like lesion in the superior segment of the left lower lobe likely represents rounded atelectasis or trapped fluid. A chronic bolus lesion is present at the left base. No other significant airspace disease is present.  The vertebral body heights and alignment are maintained. No significant pathologic enhancement is associated with the thoracic spine or spinal canal.  CT LUMBAR SPINE FINDINGS  Mild lumbar disc bulging is present at L3-4 and L4-5. Facet hypertrophy is asymmetric on the right at L4-5 results in mild right foraminal narrowing. No other significant stenosis is present.  There is no pathologic enhancement within the spinal canal for adjacent to the spine to suggest focal infection. Enlarged inguinal lymph nodes are present bilaterally. Mild atherosclerotic changes present without aneurysm. The cyst present in the right kidney.  Vertebral body heights and alignment are maintained.  IMPRESSION: 1. No significant para meningeal focus of infection is identified. 2. Multilevel degenerative cervical spine disease as described. 3. Stable vertebral body heights and alignment. 4. Hypodense lesion within the right parotid gland measures 10 x 15 mm. This could represent a hyperdense lymph node associated with HIV. However, no other significant adenopathy is present. A primary parotid lesion is also considered. 5. Inguinal lymph nodes bilaterally are likely related to HIV. 6. Bilateral pleural effusions.  Infection is not excluded. 7. Mass like area dependently in the left lower lobe likely represents rounded atelectasis or potentially some fluid trapped within the fissure. 8. Chronic bullous disease at the left  base.   Electronically Signed   By: Lawrence Santiago M.D.   On: 06/08/2013 16:32   Ct Thoracic Spine W Contrast  06/08/2013   CLINICAL DATA:  HIV and neurosyphilis. Abnormal CSF. Evaluate for para meningeal focus of infection or spinal fluid outflow obstruction.  EXAM: CT CERVICAL, THORACIC, AND LUMBAR SPINE WITHOUT CONTRAST  TECHNIQUE: Multidetector CT imaging of the cervical, lumbar, and thoracic spine was performed with intravenous contrast. Multiplanar CT image reconstructions were also generated.  CONTRAST:  184mL OMNIPAQUE IOHEXOL 300 MG/ML  SOLN  COMPARISON:  CT head without and with contrast 06/07/2013  FINDINGS: CT CERVICAL SPINE FINDINGS  No significant pathologic enhancement or soft tissue mass is present to suggest infection. The craniocervical junction is within normal limits.  A shallow central disc protrusion is present at C2-3 without significant stenosis.  C3-4: A prominent soft disc protrusion effaces the ventral CSF. The foramina are patent.  C4-5: A leftward disc protrusion is present with effacement of the ventral CSF. The foramina are patent.  C5-6: Mild uncovertebral spurring is present. A broad-based disc osteophyte complex partially effaces the ventral CSF.  C6-7: Asymmetric leftward uncovertebral spurring is associated with a disc osteophyte complex. Is partially effaces the ventral CSF. Left foraminal narrowing is evident.  C7-T1:  Negative.  The soft tissues of the neck demonstrate a low-density and noted along the medial aspect of the right parotid, potentially within the deep lobe measuring 10 x 15 mm. No other significant cervical adenopathy is present.  CT THORACIC SPINE FINDINGS  Bilateral pleural effusions and pleural nodularity is present. A mass like lesion in the superior segment of the left lower lobe likely represents rounded atelectasis or trapped fluid. A chronic bolus lesion is present at the left base. No other significant airspace disease is present.  The vertebral body  heights and alignment are maintained. No significant pathologic enhancement is associated with  the thoracic spine or spinal canal.  CT LUMBAR SPINE FINDINGS  Mild lumbar disc bulging is present at L3-4 and L4-5. Facet hypertrophy is asymmetric on the right at L4-5 results in mild right foraminal narrowing. No other significant stenosis is present.  There is no pathologic enhancement within the spinal canal for adjacent to the spine to suggest focal infection. Enlarged inguinal lymph nodes are present bilaterally. Mild atherosclerotic changes present without aneurysm. The cyst present in the right kidney.  Vertebral body heights and alignment are maintained.  IMPRESSION: 1. No significant para meningeal focus of infection is identified. 2. Multilevel degenerative cervical spine disease as described. 3. Stable vertebral body heights and alignment. 4. Hypodense lesion within the right parotid gland measures 10 x 15 mm. This could represent a hyperdense lymph node associated with HIV. However, no other significant adenopathy is present. A primary parotid lesion is also considered. 5. Inguinal lymph nodes bilaterally are likely related to HIV. 6. Bilateral pleural effusions.  Infection is not excluded. 7. Mass like area dependently in the left lower lobe likely represents rounded atelectasis or potentially some fluid trapped within the fissure. 8. 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   Electronically Signed   By: Gennette Pac M.D.   On: 06/08/2013 16:32   Ct Lumbar Spine W Contrast  06/08/2013   CLINICAL DATA:  HIV and neurosyphilis. Abnormal CSF. Evaluate for para meningeal focus of infection or spinal fluid outflow obstruction.  EXAM: CT CERVICAL, THORACIC, AND LUMBAR SPINE WITHOUT CONTRAST  TECHNIQUE: Multidetector CT imaging of the cervical, lumbar, and thoracic spine was performed with intravenous contrast. Multiplanar CT image reconstructions were also generated.  CONTRAST:  OMNIPAQUE IOHEXOL 300  MG/ML  SOLN  COMPARISON:  CT head without and with contrast 06/07/2013  FINDINGS: CT CERVICAL SPINE FINDINGS  No significant pathologic enhancement or soft tissue mass is present to suggest infection. The craniocervical junction is within normal limits.  A shallow central disc protrusion is present at C2-3 without significant stenosis.  C3-4: A prominent soft disc protrusion effaces the ventral CSF. The foramina are patent.  C4-5: A leftward disc protrusion is present with effacement of the ventral CSF. The foramina are patent.  C5-6: Mild uncovertebral spurring is present. A broad-based disc osteophyte complex partially effaces the ventral CSF.  C6-7: Asymmetric leftward uncovertebral spurring is associated with a disc osteophyte complex. Is partially effaces the ventral CSF. Left foraminal narrowing is evident.  C7-T1:  Negative.  The soft tissues of the neck demonstrate a low-density and noted along the medial aspect of the right parotid, potentially within the deep lobe measuring 10 x 15 mm. No other significant cervical adenopathy is present.  CT THORACIC SPINE FINDINGS  Bilateral pleural effusions and pleural nodularity is present. A mass like lesion in the superior segment of the left lower lobe likely represents rounded atelectasis or trapped fluid. A chronic bolus lesion is present at the left base. No other significant airspace disease is present.  The vertebral body heights and alignment are maintained. No significant pathologic enhancement is associated with the thoracic spine or spinal canal.  CT LUMBAR SPINE FINDINGS  Mild lumbar disc bulging is present at L3-4 and L4-5. Facet hypertrophy is asymmetric on the right at L4-5 results in mild right foraminal narrowing. No other significant stenosis is present.  There is no pathologic enhancement within the spinal canal for adjacent to the spine to suggest focal infection. Enlarged inguinal lymph nodes are present bilaterally. Mild atherosclerotic changes  present without aneurysm. The cyst present in the right kidney.  Vertebral body heights and alignment are maintained.  IMPRESSION: 1. No significant para meningeal focus of infection is identified. 2. Multilevel degenerative cervical spine disease as described. 3. Stable vertebral body heights and alignment. 4. Hypodense lesion within the right parotid gland measures 10 x 15 mm. This could represent a hyperdense lymph node associated with HIV. However, no other significant adenopathy is present. A primary parotid lesion is also considered. 5. Inguinal lymph nodes bilaterally are likely related to HIV. 6. Bilateral pleural effusions.  Infection is not excluded. 7. Mass like area dependently in the left lower lobe likely represents rounded atelectasis or potentially some fluid trapped within the fissure. 8. 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   Electronically Signed   By: Lawrence Santiago M.D.   On: 06/08/2013 16:32   Dg Fluoro Guide Lumbar Puncture  06/09/2013   ADDENDUM REPORT: 06/09/2013 14:01  ADDENDUM: CSF was yellow tinged.   Electronically Signed   By: Dereck Ligas M.D.   On: 06/09/2013 14:01   06/09/2013   CLINICAL DATA:  Spinal meningitis.  EXAM: DIAGNOSTIC LUMBAR PUNCTURE UNDER FLUOROSCOPIC GUIDANCE  FLUOROSCOPY TIME:  0 min 24 seconds  PROCEDURE: Informed consent was obtained from the patient prior to the procedure, including potential complications of headache, allergy, and pain. With the patient prone, the lower back was prepped with Betadine. 1% Lidocaine was used for local anesthesia. Lumbar puncture was performed at the L3-L4 level using a 3-1/2 inch 20 gauge needle with return of clear CSF. 36 ml of CSF were obtained for laboratory studies. The patient tolerated the procedure well and there were no apparent complications.  IMPRESSION: Technically successful fluoroscopically guided L3-L4 lumbar puncture with 36 mL of CSF harvested.  Electronically Signed: By: Dereck Ligas M.D. On:  06/09/2013 13:57      Assessment/Plan:  Active Problems:   ATRIOVENTRICULAR BLOCK, MOBITZ TYPE II   Pacemaker- medtronic   Abnormality of gait   Human immunodeficiency virus (HIV) disease   Lower extremity weakness   Syphilis   Malnutrition of moderate degree    Shaun Lutz is a 58 y.o. 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 but he could have 2-3 other CNS pathologies going on as well  With re to VZV--those antibodies were on SERUM and not on CSF, therefore this is NOT evidence for VZV vasculopathy  VZV would not produce the severely low glucose that we are seeing on his LP   I arranged for repeat LP today and Dr. Gerilyn Pilgrim obtained 4 x 9+ ml of CSF = >36 ml of CSF  I personally went to the lab and asked technician to take  -_ONE dedicated tube and then centrifuge the CSF and send sediment for AFB culture  AND SAVE SUPERNATANT  --then take a 2nd tube and centrifuge the CSF and send the sediment for FUngal culture  AND SAVE SUPERNATANT  --ONE 37ml tube was sent to cytology and flow cytometry with results to be back by Monday per pathology who received these fresh specimens  --I asked that dedicated CSF be sent for MTB PCR  --I would make sure that we send CSF for  blastomyces and histoplasma antigen to [facility] labs  --one could certainlly also send for CMV by PCR, HHV 6, 7 PCR --though I DONT see any of these viruses causing the pts CSF profile  THIS PATHOLOGY has got to be either NEUROSYPHILIS, CARCINOMATOUS MENINGIITS, TB, DIMORPHIC FUNGAL MENINGITIS OR LISTERIA  I ONLY THINK RX FOR NEUROSYPHILIS WITH HIGH DOSE PCN IS WARRANTED AT THIS POINT  I spent greater than 40 minutes with  the patient including greater than 50% of time in face to face counsel of the patient and in coordination of their care.    #2 HIV/AIDS: he will need HIV ultra quant and HIV integrase genotype, CD4 checked  ---we should NOT start ARV until we understand what kind of CNS pathology we are dealing with as there would be a high risk of immune reconstitution inflammatory syndrome with for example tuberculosis meningitis.   Dr. Baxter Flattery will followup on this patient over the weekend.     LOS: 2 days   Truman Hayward 06/09/2013, 7:15 PM

## 2013-06-09 NOTE — Procedures (Signed)
H2-0 LP, no complications.  36 mL yellow tinged CSF

## 2013-06-09 NOTE — Progress Notes (Signed)
I have seen and examined this patient. I have discussed with Dr Marsh.  I agree with their findings and plans as documented in their progress note.    

## 2013-06-09 NOTE — Progress Notes (Signed)
CRITICAL VALUE ALERT  Critical value received:  CSF WBC count = 98 & CSF Glucose = 20  Date of notification:  06/09/2013  Time of notification:  0712  Critical value read back:yes  Nurse who received alert:  Talbot Grumbling  Time of first page:  1551

## 2013-06-09 NOTE — Clinical Social Work Note (Signed)
Patient requested and was given Advanced Directive packet. Fredrik Cove, patient's girlfriend was in the room and is the person patient wants to be his HCPOA. Ms. Ronnie Doss wants her daughter to read over information before they sign and have it notarized.  Lareen Mullings Givens, MSW, LCSW [phone]

## 2013-06-10 DIAGNOSIS — A879 Viral meningitis, unspecified: Secondary | ICD-10-CM | POA: Diagnosis not present

## 2013-06-10 DIAGNOSIS — R269 Unspecified abnormalities of gait and mobility: Secondary | ICD-10-CM | POA: Diagnosis not present

## 2013-06-10 DIAGNOSIS — B20 Human immunodeficiency virus [HIV] disease: Secondary | ICD-10-CM | POA: Diagnosis not present

## 2013-06-10 DIAGNOSIS — J449 Chronic obstructive pulmonary disease, unspecified: Secondary | ICD-10-CM | POA: Diagnosis not present

## 2013-06-10 DIAGNOSIS — Z950 Presence of cardiac pacemaker: Secondary | ICD-10-CM | POA: Diagnosis not present

## 2013-06-10 DIAGNOSIS — A523 Neurosyphilis, unspecified: Secondary | ICD-10-CM | POA: Diagnosis not present

## 2013-06-10 LAB — VDRL, CSF

## 2013-06-10 LAB — HERPES SIMPLEX VIRUS(HSV) DNA BY PCR
HSV 1 DNA: NOT DETECTED
HSV 2 DNA: NOT DETECTED

## 2013-06-10 LAB — CSF CULTURE W GRAM STAIN: Culture: NO GROWTH

## 2013-06-10 LAB — HIV-1 RNA ULTRAQUANT REFLEX TO GENTYP+
HIV 1 RNA Quant: 59117 copies/mL — ABNORMAL HIGH (ref <20)
HIV-1 RNA Quant, Log: 4.77 {Log} — ABNORMAL HIGH (ref <1.30)

## 2013-06-10 LAB — CSF CULTURE

## 2013-06-10 NOTE — Progress Notes (Signed)
I have seen and examined this patient. I have discussed with Dr Venetia Maxon.  I agree with their findings and plans as documented in their progress note.  Loose several loose stools overnite.  Will check C. Diff.  Patients fiance concerned that it is related to start of SSRI yesterday so will stop SSRI for now and revisit depression diagnosis and therapy again in future.

## 2013-06-10 NOTE — Progress Notes (Signed)
Family Medicine Teaching Service Daily Progress Note Intern Pager: [phone]  Patient name: Shaun Lutz Medical record number: [id number] Date of birth: 09-03-1955 Age: 58 y.o. Gender: male  Primary Care Provider: Donnamae Jude, MD Consultants: ID, neuro Code Status: full  Pt Overview and Major Events to Date: Shaun Lutz is a 58 y.o. 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 Lucianne Lei Dam's note 4/11: soiled himself overnight, awoke confused, so Zoloft discontinued; otherwise little change  Assessment and Plan:   # ID- HIV positive, Syphilis positive; question meningitis  Ddx includes Neurosyphilis (with post RPR and t pallidum >8) vs HIV CNS involvement (toxo, lymphoma, PML, encephalopathy,CMV, fungal) vs Viral menigitis. May be multifactorial. No evidence of spinal outflow obstruction by CT spine. CD4 count 250. 2D echo grossly normal. - await diagnostics from repeat LP and pending labs for HIV prior to starting any antiretroviral meds -f/up LP results (CSF culture, protein/glucose, TB by PCR, lymphocyte flow, HSV by PCR, fungal with smear, cytology, cell count, AFB with smear)  -ID following greatly appreciate recs and management -covering with penG, acyclovir; defer to ID for narrowing as clinically appropriate   #Neuro- Gait abnormality, hearing loss, CNS infection, cogwheel rigidity  Ddx includes sequelae of HIV/syphilis vs parkinsonian features vs demyelinating d/o vs NPH vs vitamin deficiency vs metabolic abnormality. Likely  patient having features related to progression of infectious disease (see above). Possible component of parkisons given dysmetria, memory loss, rigidity. 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  2D echo 4/10 grossly normal Currently normotensive, intermittently bradycardic (with a pacer).  -cont lisinopril  -hold atenolol, pacer interrogated  -cont ASA   #Pulm- COPD? Bullous changes seen on CT chest with bilat infiltrates -no prescriptions for COPD currently  -vitals per floor protocol, will cont to monitor with pulse ox   #Psych/Social Suicidal ideation- in light of recent diagnosis, denies SI currently and states hasnt had any SI in a week; girlfriend feels he is severely depressed - no 1:1 at this time, but will continually reassess  - D/C Zoloft and monitor clinically for now (had loose stool and ?increased confusion overnight 4/10 after starting)  #FEN/GI: heart healthy diet, saline lock IV; replete electrolytes prn  Disposition: management as above with extensive further work-up pending (unknown infectious etiology vs malignancy vs neurodegenerative d/o); discharge planning pending numerous results and appropriate therapeutic decisions to be made, as above  Subjective: Pt alert / awake this morning, in no distress. Denies pain. States his appetite is okay.    Objective: Temp:  [97 F (36.1 C)-98 F (36.7 C)] 97 F (36.1 C) (04/11 0900) Pulse Rate:  [63-75] 75 (04/11 0900) Resp:  [18-19] 19 (04/11 0900) BP: (112-154)/(70-98) 154/98 mmHg (04/11 0900) SpO2:  [97 %-99 %] 99 % (04/11 0900) Weight:  [113 lb 8 oz (51.483 kg)]  113 lb 8 oz (51.483 kg) (04/10 2022) Physical  Exam: Gen: adult male appears older than stated age, in NAD; intermittently confused but cooperative with exam  HEENT: NCAT, EOMI with horizontal nystagmus, PER, pinpoint and minimally responsive to light; poor dentition; no palatal lesions, white discoloration of tongue remains Neck: normal ROM, supple, no rigidity CV: irregularly irregularly without definite murmur, cap refill <3  Resp: CTABL, no wheezes, non-labored  Abd: thin, scaphoid, SNTND, BS present, no guarding or organomegaly  Ext: thin extremities, warm, normal tone, Neuro: orientation not specifically asked this AM; otherwise little change in neuro status Skin: vitiligo, multiple hyperpigmented macules throughout entire body including palms and soles;   scattered excoriations without evidence for skin superinfection  Laboratory:  Recent Labs Lab 06/07/13 1010 06/07/13 2025  WBC 4.9 4.3  HGB 14.0 12.6*  HCT 39.9 36.4*  PLT 195 193    Recent Labs Lab 06/05/13 1033 06/07/13 1010 06/07/13 2025  NA 133* 136*  --   K  --  4.0  --   CL  --  97  --   CO2  --  30  --   BUN  --  12  --   CREATININE  --  0.85 0.68  CALCIUM  --  9.0  --   PROT  --  8.3  --   BILITOT  --  0.5  --   ALKPHOS  --  77  --   ALT  --  20  --   AST  --  18  --   GLUCOSE  --  140*  --    utox neg  U/a neg apart from 2.0 protein  CSF gram stain no organism  Crypto ag neg   06/07/2013 15:55   Glucose, CSF  7 (LL)   Total Protein, CSF  >600 (H)   RBC Count, CSF  0   WBC, CSF  86 (HH)   Segmented Neutrophils-CSF  60 (H)   Lymphs, CSF  35 (L)   Monocyte-Macrophage-Spinal Fluid  5 (L)   Eosinophils, CSF  NONE SEEN   Appearance, CSF  CLEAR (A)   Color, CSF  YELLOW (A)   Supernatant  XANTHOCHROMIC   Tube #  1      Imaging/Diagnostic Tests: CT head 06/07/13 IMPRESSION:  1. Hypoattenuation in the left temporal tip appears parenchymal and  are likely related to ischemia of and an extra-axial arachnoid cyst.  2. Diffuse subcortical white  matter hypoattenuation bilaterally is  stable. 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  3. Mild prominence the ventricles bilaterally is likely due to  atrophy. Mild hydrocephalus is considered less likely. Lumbar  puncture may be useful for further evaluation.   CT cervical spine 06/08/13 IMPRESSION:  1. No significant para meningeal focus of infection is identified.  2. Multilevel degenerative cervical spine disease as described.  3. Stable vertebral body heights and alignment.  4. Hypodense lesion within the right parotid gland measures 10 x 15  mm. This could represent a hyperdense lymph node associated with  HIV. However, no other significant adenopathy is present. A primary  parotid lesion is also considered.  5. Inguinal lymph nodes bilaterally are likely related to HIV.  6. Bilateral pleural effusions. Infection is not excluded.  7. Mass like area dependently in the left lower lobe likely  represents rounded atelectasis or potentially some fluid trapped  within the fissure.  8. 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   Emmaline Kluver, MD  06/10/2013, 11:52 AM PGY-2, [place] Intern pager: [phone], text pages welcome

## 2013-06-10 NOTE — Progress Notes (Signed)
[facility] for Infectious Disease  Day # 4 penicillin Total abtx day #4   Subjective: Feels better No current facility-administered medications on file prior to encounter.   Current Outpatient Prescriptions on File Prior to Encounter  Medication Sig Dispense Refill  . aspirin 81 MG tablet Take 1 tablet (81 mg total) by mouth daily.  180 tablet  2  . lisinopril (PRINIVIL,ZESTRIL) 5 MG tablet Take 1 tablet (5 mg total) by mouth daily.  90 tablet  3  . Nutritional Supplements (ENSURE ACTIVE HIGH PROTEIN) LIQD Take 1 Can by mouth 3 (three) times daily.  60 Can  2  . carbidopa-levodopa (SINEMET IR) 25-100 MG per tablet Take 1 tablet by mouth 3 (three) times daily.  90 tablet  5     Objective: Weight change: -5 lb (-2.267 kg)  Intake/Output Summary (Last 24 hours) at 06/10/13 1252 Last data filed at 06/10/13 1239  Gross per 24 hour  Intake    480 ml  Output    775 ml  Net   -295 ml   Blood pressure 154/98, pulse 75, temperature 97 F (36.1 C), temperature source Oral, resp. rate 19, height 5' 8.11" (1.73 m), weight 113 lb 8 oz (51.483 kg), SpO2 99.00%. Temp:  [97 F (36.1 C)-98 F (36.7 C)] 97 F (36.1 C) (04/11 0900) Pulse Rate:  [63-75] 75 (04/11 0900) Resp:  [18-19] 19 (04/11 0900) BP: (112-154)/(70-98) 154/98 mmHg (04/11 0900) SpO2:  [97 %-99 %] 99 % (04/11 0900) Weight:  [113 lb 8 oz (51.483 kg)] 113 lb 8 oz (51.483 kg) (04/10 2022)  Physical Exam: gen = elderly AAM with vitiligo with poor dentition. Pleasantly confused. Only oriented to self and hospital location. Unable to recall month, day of the week, nor the calendar year. HEENT: anicteric sclera, pupils reactive to light and accommodation, EOMI, poor dentition, no thrush CVS regular rate, normal r, no murmur rubs or gallops  Chest: clear to auscultation bilaterally, no wheezing, rales or rhonchi  Abdomen: soft nontender, nondistended, normal bowel sounds,  Extremities: no clubbing or edema noted  bilaterally  Skin: The like go along with hyperpigmented lesions seen below and pictures  Neuro:  nonfocal  CBC:  Recent Labs Lab 06/07/13 1010 06/07/13 2025  HGB 14.0 12.6*  HCT 39.9 36.4*  PLT 195 193  INR 1.00  --      BMET BMET    Component Value Date/Time   NA 136* 06/07/2013 1010   K 4.0 06/07/2013 1010   CL 97 06/07/2013 1010   CO2 30 06/07/2013 1010   GLUCOSE 140* 06/07/2013 1010   BUN 12 06/07/2013 1010   CREATININE 0.68 06/07/2013 2025   CREATININE 0.69 05/22/2013 1548   CALCIUM 9.0 06/07/2013 1010   GFRNONAA >90 06/07/2013 2025   GFRAA >90 06/07/2013 2025     Micro Results: Recent Results (from the past 240 hour(s))  GRAM STAIN     Status: None   Collection Time    06/07/13  3:55 PM      Result Value Ref Range Status   Specimen Description CSF   Final   Special Requests 6.0ML FLUID   Final   Gram Stain     Final   Value: CYTOSPIN PREP     WBC PRESENT, PREDOMINANTLY PMN     NO ORGANISMS SEEN   Report Status 06/07/2013 FINAL   Final  CSF CULTURE     Status: None   Collection Time    06/07/13  3:55 PM  Result Value Ref Range Status   Specimen Description CSF   Final   Special Requests 6.0ML CSF FLUID   Final   Gram Stain     Final   Value: WBC PRESENT,BOTH PMN AND MONONUCLEAR     NO ORGANISMS SEEN     Performed at [facility] CYTOSPIN     Performed at [facility]   Culture     Final   Value: NO GROWTH 2 DAYS     Performed at [facility]   Report Status PENDING   Incomplete    Studies/Results: Dg Fluoro Guide Lumbar Puncture  06/09/2013   ADDENDUM REPORT: 06/09/2013 14:01  ADDENDUM: CSF was yellow tinged.   Electronically Signed   By: Dereck Ligas M.D.   On: 06/09/2013 14:01   06/09/2013   CLINICAL DATA:  Spinal meningitis.  EXAM: DIAGNOSTIC LUMBAR PUNCTURE UNDER FLUOROSCOPIC GUIDANCE  FLUOROSCOPY TIME:  0 min 24 seconds  PROCEDURE: Informed consent was obtained from the patient prior to the procedure, including potential  complications of headache, allergy, and pain. With the patient prone, the lower back was prepped with Betadine. 1% Lidocaine was used for local anesthesia. Lumbar puncture was performed at the L3-L4 level using a 3-1/2 inch 20 gauge needle with return of clear CSF. 36 ml of CSF were obtained for laboratory studies. The patient tolerated the procedure well and there were no apparent complications.  IMPRESSION: Technically successful fluoroscopically guided L3-L4 lumbar puncture with 36 mL of CSF harvested.  Electronically Signed: By: Dereck Ligas M.D. On: 06/09/2013 13:57      Assessment/Plan:  Active Problems:   ATRIOVENTRICULAR BLOCK, MOBITZ TYPE II   Pacemaker- medtronic   Abnormality of gait   Human immunodeficiency virus (HIV) disease   Lower extremity weakness   Syphilis   Malnutrition of moderate degree    Shaun Lutz is a 58 y.o. 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 Cancer. Repeat tap on 4/10 for more CSF studies sent out.   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 - imaging of spine rules out mass causing spinal block. But still awaiting csf cytology results - may need to add jc virus onto csf   #2 HIV/AIDS: HIV ultra quant and HIV integrase genotype pending, CD4 checked. Until we can determine etiology of CSF abnormalities, and especially rule out mtb meningitis, we will hold on initiation of HAART      LOS: 3 days   Shaun Lutz 06/10/2013, 12:52 PM

## 2013-06-11 DIAGNOSIS — R269 Unspecified abnormalities of gait and mobility: Secondary | ICD-10-CM | POA: Diagnosis not present

## 2013-06-11 LAB — CLOSTRIDIUM DIFFICILE BY PCR: CDIFFPCR: NEGATIVE

## 2013-06-11 NOTE — Progress Notes (Signed)
Family Medicine Teaching Service Daily Progress Note Intern Pager: [phone]  Patient name: Shaun Lutz Medical record number: [id number] Date of birth: [birth year] Age: 58 y.o. Gender: male  Primary Care Provider: Donnamae Jude, MD Consultants: ID, neuro Code Status: full  Pt Overview and Major Events to Date: Shaun Lutz is a 58 y.o. 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 Lucianne Lei Dam's note 4/11: soiled himself overnight, awoke confused, so Zoloft discontinued; otherwise little change 4/13: severe panic attacks, perseverative, ativan 1mg   Assessment and Plan: Shaun Lutz is a 58 y.o. 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 May be multifactorial. No evidence of spinal outflow obstruction by CT spine. CD4 count 250. 2D echo grossly normal. - await diagnostics from repeat LP and pending labs for HIV prior to starting any antiretroviral meds -f/up repeat LP results   - CSF culture / studies (see results review)  - TB by PCR  - HSV by PCR (NEGATIVE)  - fungal with smear (negative  smear, Cx pending)  - AFB with smear (negative smear, Cx pending)  - viral culture -ID following greatly appreciate recs and management -covering with high-dose Pen-G, defer to ID for duration of regimen, expect 10-14days   #Neuro - little change since admission, other than slight improvement in UE resting/intention tremor on finger-nose - Gait abnormality, hearing loss, CNS infection, cogwheel rigidity; DDx includes sequelae of HIV/syphilis vs parkinsonian features vs demyelinating d/o vs NPH vs vitamin deficiency vs metabolic abnormality. Likely patient having features related to progression of infectious disease (see above). Possible component of parkisons given dysmetria, memory loss, rigidity. 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  - 2D echo 4/10 grossly normal, Currently normotensive, intermittently bradycardic (with a pacer).  - cont lisinopril  - hold atenolol, pacer interrogated; consider formal cards consult if needed - cont ASA   #Pulm- COPD? Bullous changes seen on CT chest with bilat infiltrates -no prescriptions for COPD currently  -vitals per floor protocol, will cont to monitor with pulse ox   #Psych/Social Suicidal ideation- in light of recent diagnosis, denies SI currently and states hasnt had any SI in a week; girlfriend feels he is severely depressed; also prone to anxiety attacks, hyperemotional - no 1:1 at this time, but will continually reassess  - D/C'd Zoloft and monitor clinically for now (had loose stool and ?increased confusion overnight 4/10 after starting) -ativan 1prn  #FEN/GI: heart healthy diet, saline  lock IV; replete electrolytes prn -hold PO  until patient able to calm down and protect airway  Disposition: management as above with extensive further work-up pending (unknown infectious etiology vs malignancy vs neurodegenerative d/o); discharge planning pending numerous results and appropriate therapeutic decisions to be made, as above  Subjective: Perseverative on death of girlfriends mother and his own parents; choking episode resolved with heimlich   Objective: Temp:  [97.5 F (36.4 C)-98 F (36.7 C)] 97.7 F (36.5 C) (04/12 1700) Pulse Rate:  [56-74] 66 (04/12 1700) Resp:  [18-19] 19 (04/12 1700) BP: (104-133)/(58-79) 121/79 mmHg (04/12 1700) SpO2:  [98 %-100 %] 100 % (04/12 1700) Physical Exam: Gen: adult male appears older than stated age, in NAD; intermittently anxious and hyper-emotional but cooperative with exam  HEENT: NCAT, EOMI with horizontal nystagmus, PER, pinpoint and minimally responsive to light; poor dentition; no palatal lesions, white discoloration of tongue remains Neck: normal ROM, supple, no rigidity CV: irregularly irregularly without definite murmur, cap refill <3  Resp: CTABL, no wheezes, non-labored  Abd: thin, scaphoid, SNTND, BS present, no guarding or organomegaly  Ext: thin extremities, warm, normal tone, Neuro: orientation not specifically asked this AM; otherwise little change in neuro status; Mild / subtle resting tremor in UE at baseline worsened with anxiety and movement  Skin: vitiligo, multiple hyperpigmented macules throughout entire body including palms and soles; scattered excoriations without evidence for skin superinfection  Laboratory:  Recent Labs Lab 06/07/13 1010 06/07/13 2025  WBC 4.9 4.3  HGB 14.0 12.6*  HCT 39.9 36.4*  PLT 195 193    Recent Labs Lab 06/05/13 1033 06/07/13 1010 06/07/13 2025  NA 133* 136*  --   K  --  4.0  --   CL  --  97  --   CO2  --  30  --   BUN  --  12  --   CREATININE  --  0.85 0.68  CALCIUM  --  9.0  --   PROT  --  8.3  --   BILITOT   --  0.5  --   ALKPHOS  --  77  --   ALT  --  20  --   AST  --  18  --   GLUCOSE  --  140*  --    utox neg  U/a neg apart from 2.0 protein  CSF gram stain no organism  Crypto ag neg  See results review and A/P for extensive CSF study results  Imaging/Diagnostic Tests: CT head 06/07/13 IMPRESSION:  1. Hypoattenuation in the left temporal tip appears parenchymal and  are likely related to ischemia of and an extra-axial arachnoid cyst.  2. Diffuse subcortical white matter hypoattenuation bilaterally is  stable. 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  3. Mild prominence the ventricles bilaterally is likely due to  atrophy. Mild hydrocephalus is considered less likely. Lumbar  puncture may be useful for further evaluation.   CT cervical spine 06/08/13 IMPRESSION:  1. No significant para meningeal focus of infection is identified.  2. Multilevel degenerative cervical spine disease as described.  3. Stable vertebral body heights and alignment.  4. Hypodense lesion within the right parotid gland measures 10 x 15  mm. This could represent a hyperdense lymph node associated with  HIV. However, no other significant adenopathy is present. A primary  parotid lesion is also considered.  5. Inguinal lymph nodes bilaterally are likely related to HIV.  6. Bilateral pleural effusions. Infection is not excluded.  7. Mass like area dependently in the left lower lobe likely  represents rounded atelectasis or potentially some fluid trapped  within the fissure.  8. 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   Langston Masker, MD 06/11/2013, 9:19 PM PGY-1, [place] Intern pager: [phone], text pages welcome

## 2013-06-11 NOTE — Progress Notes (Addendum)
[facility]  Day # 5 penicillin Total abtx day #5   Subjective: Feels better No current facility-administered medications on file prior to encounter.   Current Outpatient Prescriptions on File Prior to Encounter  Medication Sig Dispense Refill  . aspirin 81 MG tablet Take 1 tablet (81 mg total) by mouth daily.  180 tablet  2  . lisinopril (PRINIVIL,ZESTRIL) 5 MG tablet Take 1 tablet (5 mg total) by mouth daily.  90 tablet  3  . Nutritional Supplements (ENSURE ACTIVE HIGH PROTEIN) LIQD Take 1 Can by mouth 3 (three) times daily.  60 Can  2  . carbidopa-levodopa (SINEMET IR) 25-100 MG per tablet Take 1 tablet by mouth 3 (three) times daily.  90 tablet  5     Objective: Weight change:   Intake/Output Summary (Last 24 hours) at 06/11/13 1041 Last data filed at 06/11/13 0900  Gross per 24 hour  Intake    360 ml  Output    675 ml  Net   -315 ml   Blood pressure 110/74, pulse 74, temperature 98 F (36.7 C), temperature source Oral, resp. rate 19, height 5' 8.11" (1.73 m), weight 113 lb 8 oz (51.483 kg), SpO2 99.00%. Temp:  [97.5 F (36.4 C)-98 F (36.7 C)] 98 F (36.7 C) (04/12 0900) Pulse Rate:  [56-74] 74 (04/12 0900) Resp:  [18-22] 19 (04/12 0900) BP: (104-135)/(58-97) 110/74 mmHg (04/12 0900) SpO2:  [98 %-99 %] 99 % (04/12 0900)  Physical Exam: gen = elderly AAM with vitiligo with poor dentition. Pleasantly confused. Only oriented to self and hospital location. Unable to recall month, day of the week, nor the calendar year. HEENT: anicteric sclera, pupils reactive to light and accommodation, EOMI, poor dentition, no thrush CVS regular rate, normal r, no murmur rubs or gallops  Chest: clear to auscultation bilaterally, no wheezing, rales or rhonchi  Abdomen: soft nontender, nondistended, normal bowel sounds,  Extremities: no clubbing or edema noted bilaterally  Skin: The like go along with hyperpigmented lesions seen below and pictures  Neuro:   nonfocal  CBC:  Recent Labs Lab 06/07/13 1010 06/07/13 2025  HGB 14.0 12.6*  HCT 39.9 36.4*  PLT 195 193  INR 1.00  --      BMET BMET    Component Value Date/Time   NA 136* 06/07/2013 1010   K 4.0 06/07/2013 1010   CL 97 06/07/2013 1010   CO2 30 06/07/2013 1010   GLUCOSE 140* 06/07/2013 1010   BUN 12 06/07/2013 1010   CREATININE 0.68 06/07/2013 2025   CREATININE 0.69 05/22/2013 1548   CALCIUM 9.0 06/07/2013 1010   GFRNONAA >90 06/07/2013 2025   GFRAA >90 06/07/2013 2025     Micro Results: Recent Results (from the past 240 hour(s))  GRAM STAIN     Status: None   Collection Time    06/07/13  3:55 PM      Result Value Ref Range Status   Specimen Description CSF   Final   Special Requests 6.0ML FLUID   Final   Gram Stain     Final   Value: CYTOSPIN PREP     WBC PRESENT, PREDOMINANTLY PMN     NO ORGANISMS SEEN   Report Status 06/07/2013 FINAL   Final  CSF CULTURE     Status: None   Collection Time    06/07/13  3:55 PM      Result Value Ref Range Status   Specimen Description CSF   Final  Special Requests 6.0ML CSF FLUID   Final   Gram Stain     Final   Value: WBC PRESENT,BOTH PMN AND MONONUCLEAR     NO ORGANISMS SEEN     Performed at [facility] CYTOSPIN     Performed at [facility]   Culture     Final   Value: NO GROWTH 3 DAYS     Performed at [facility]   Report Status 06/10/2013 FINAL   Final  FUNGUS CULTURE W SMEAR     Status: None   Collection Time    06/09/13  1:33 PM      Result Value Ref Range Status   Specimen Description CSF   Final   Special Requests 9.0ML   Final   Fungal Smear     Final   Value: NO YEAST OR FUNGAL ELEMENTS SEEN     Performed at [facility]   Culture     Final   Value: CULTURE IN PROGRESS FOR FOUR WEEKS     Performed at [facility]   Report Status PENDING   Incomplete  AFB CULTURE WITH SMEAR     Status: None   Collection Time    06/09/13  1:33 PM      Result Value Ref Range Status    Specimen Description CSF   Final   Special Requests 9.0ML FLUID   Final   ACID FAST SMEAR     Final   Value: NO ACID FAST BACILLI SEEN     Performed at [facility]   Culture     Final   Value: CULTURE WILL BE EXAMINED FOR 6 WEEKS BEFORE ISSUING A FINAL REPORT     Performed at [facility]   Report Status PENDING   Incomplete    Studies/Results: Dg Fluoro Guide Lumbar Puncture  06/09/2013   ADDENDUM REPORT: 06/09/2013 14:01  ADDENDUM: CSF was yellow tinged.   Electronically Signed   By: Dereck Ligas M.D.   On: 06/09/2013 14:01   06/09/2013   CLINICAL DATA:  Spinal meningitis.  EXAM: DIAGNOSTIC LUMBAR PUNCTURE UNDER FLUOROSCOPIC GUIDANCE  FLUOROSCOPY TIME:  0 min 24 seconds  PROCEDURE: Informed consent was obtained from the patient prior to the procedure, including potential complications of headache, allergy, and pain. With the patient prone, the lower back was prepped with Betadine. 1% Lidocaine was used for local anesthesia. Lumbar puncture was performed at the L3-L4 level using a 3-1/2 inch 20 gauge needle with return of clear CSF. 36 ml of CSF were obtained for laboratory studies. The patient tolerated the procedure well and there were no apparent complications.  IMPRESSION: Technically successful fluoroscopically guided L3-L4 lumbar puncture with 36 mL of CSF harvested.  Electronically Signed: By: Dereck Ligas M.D. On: 06/09/2013 13:57      Assessment/Plan:  Active Problems:   ATRIOVENTRICULAR BLOCK, MOBITZ TYPE II   Pacemaker- medtronic   Abnormality of gait   Human immunodeficiency virus (HIV) disease   Lower extremity weakness   Syphilis   Malnutrition of moderate degree    Shaun Lutz is a 58 y.o. 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 Repeat tap on 4/10 for more CSF studies sent out. 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 -  still awaiting csf cytology results for malignancy work up - may need to add jc virus onto csf   #2 HIV/AIDS:  HIV integrase genotype pending. Until we can determine etiology of CSF abnormalities, and especially rule out mtb meningitis, we will hold on initiation of HAART      LOS: 4 days   [name] 06/11/2013, 10:41 AM

## 2013-06-11 NOTE — Progress Notes (Signed)
Subjective: Patient without new complaints.  Tremor seems improved.  Has not gotten up to ambulate.    Objective: Current vital signs: BP 110/74  Pulse 74  Temp(Src) 98 F (36.7 C) (Oral)  Resp 19  Ht 5' 8.11" (1.73 m)  Wt 51.483 kg (113 lb 8 oz)  BMI 17.20 kg/m2  SpO2 99% Vital signs in last 24 hours: Temp:  [97.5 F (36.4 C)-98 F (36.7 C)] 98 F (36.7 C) (04/12 0900) Pulse Rate:  [56-74] 74 (04/12 0900) Resp:  [18-22] 19 (04/12 0900) BP: (104-135)/(58-97) 110/74 mmHg (04/12 0900) SpO2:  [98 %-99 %] 99 % (04/12 0900)  Intake/Output from previous day: 04/11 0701 - 04/12 0700 In: 240 [P.O.:240] Out: 675 [Urine:675] Intake/Output this shift: Total I/O In: 240 [P.O.:240] Out: 0  Nutritional status: Dysphagia  Neurologic Exam: Alert, oriented, thought content appropriate. Speech fluent without evidence of aphasia. Able to follow 3 step commands without difficulty.  Cranial Nerves:  II: Visual fields grossly normal, pupils equal, round, reactive to light and accommodation  III,IV, VI: ptosis not present, extra-ocular motions intact bilaterally  V,VII: smile symmetric, facial light touch sensation normal bilaterally  VIII: hearing decreased bilaterally  IX,X: gag reflex present  XI: bilateral shoulder shrug  XII: midline tongue extension  Motor:  5/5 throughout  Tone and bulk: remains increased tone in both upper extremities. No tremor noted Sensory: Pinprick and light touch intact throughout, bilaterally  Deep Tendon Reflexes: 2+ and symmetric throughout  Plantars:  Right: downgoing   Left: downgoing  Cerebellar:  normal finger-to-nose testing bilaterally. Dysmetria on the right heel to shin test   Lab Results: Basic Metabolic Panel:  Recent Labs Lab 06/05/13 1033 06/07/13 1010 06/07/13 2025  NA 133* 136*  --   K  --  4.0  --   CL  --  97  --   CO2  --  30  --   GLUCOSE  --  140*  --   BUN  --  12  --   CREATININE  --  0.85 0.68  CALCIUM  --  9.0  --      Liver Function Tests:  Recent Labs Lab 06/07/13 1010  AST 18  ALT 20  ALKPHOS 77  BILITOT 0.5  PROT 8.3  ALBUMIN 3.4*   No results found for this basename: LIPASE, AMYLASE,  in the last 168 hours No results found for this basename: AMMONIA,  in the last 168 hours  CBC:  Recent Labs Lab 06/07/13 1010 06/07/13 2025  WBC 4.9 4.3  NEUTROABS 2.3  --   HGB 14.0 12.6*  HCT 39.9 36.4*  MCV 90.1 89.4  PLT 195 193    Cardiac Enzymes:  Recent Labs Lab 06/07/13 1010  TROPONINI <0.30    Lipid Panel: No results found for this basename: CHOL, TRIG, HDL, CHOLHDL, VLDL, LDLCALC,  in the last 168 hours  CBG:  Recent Labs Lab 06/07/13 1731  GLUCAP 76    Microbiology: Results for orders placed during the hospital encounter of 06/07/13  GRAM STAIN     Status: None   Collection Time    06/07/13  3:55 PM      Result Value Ref Range Status   Specimen Description CSF   Final   Special Requests 6.0ML FLUID   Final   Gram Stain     Final   Value: CYTOSPIN PREP     WBC PRESENT, PREDOMINANTLY PMN     NO ORGANISMS SEEN   Report Status 06/07/2013  FINAL   Final  CSF CULTURE     Status: None   Collection Time    06/07/13  3:55 PM      Result Value Ref Range Status   Specimen Description CSF   Final   Special Requests 6.0ML CSF FLUID   Final   Gram Stain     Final   Value: WBC PRESENT,BOTH PMN AND MONONUCLEAR     NO ORGANISMS SEEN     Performed at [facility] CYTOSPIN     Performed at [facility]   Culture     Final   Value: NO GROWTH 3 DAYS     Performed at [facility]   Report Status 06/10/2013 FINAL   Final  FUNGUS CULTURE W SMEAR     Status: None   Collection Time    06/09/13  1:33 PM      Result Value Ref Range Status   Specimen Description CSF   Final   Special Requests 9.0ML   Final   Fungal Smear     Final   Value: NO YEAST OR FUNGAL ELEMENTS SEEN     Performed at [facility]   Culture     Final   Value: CULTURE IN  PROGRESS FOR FOUR WEEKS     Performed at [facility]   Report Status PENDING   Incomplete  AFB CULTURE WITH SMEAR     Status: None   Collection Time    06/09/13  1:33 PM      Result Value Ref Range Status   Specimen Description CSF   Final   Special Requests 9.0ML FLUID   Final   ACID FAST SMEAR     Final   Value: NO ACID FAST BACILLI SEEN     Performed at [facility]   Culture     Final   Value: CULTURE WILL BE EXAMINED FOR 6 WEEKS BEFORE ISSUING A FINAL REPORT     Performed at [facility]   Report Status PENDING   Incomplete    Coagulation Studies: No results found for this basename: LABPROT, INR,  in the last 72 hours  Imaging: Dg Fluoro Guide Lumbar Puncture  06/09/2013   ADDENDUM REPORT: 06/09/2013 14:01  ADDENDUM: CSF was yellow tinged.   Electronically Signed   By: Dereck Ligas M.D.   On: 06/09/2013 14:01   06/09/2013   CLINICAL DATA:  Spinal meningitis.  EXAM: DIAGNOSTIC LUMBAR PUNCTURE UNDER FLUOROSCOPIC GUIDANCE  FLUOROSCOPY TIME:  0 min 24 seconds  PROCEDURE: Informed consent was obtained from the patient prior to the procedure, including potential complications of headache, allergy, and pain. With the patient prone, the lower back was prepped with Betadine. 1% Lidocaine was used for local anesthesia. Lumbar puncture was performed at the L3-L4 level using a 3-1/2 inch 20 gauge needle with return of clear CSF. 36 ml of CSF were obtained for laboratory studies. The patient tolerated the procedure well and there were no apparent complications.  IMPRESSION: Technically successful fluoroscopically guided L3-L4 lumbar puncture with 36 mL of CSF harvested.  Electronically Signed: By: Dereck Ligas M.D. On: 06/09/2013 13:57    Medications:  I have reviewed the patient's current medications. Scheduled: . aspirin  81 mg Oral Daily  . atenolol  25 mg Oral Daily  . feeding supplement (ENSURE COMPLETE)  237 mL Oral TID  . heparin  5,000 Units  Subcutaneous 3 times per day  . lisinopril  5 mg Oral  Daily  . pencillin G potassium IV  4 Million Units Intravenous Q4H  . sodium chloride  3 mL Intravenous Q12H    Assessment/Plan: Patient with improvement in his upper extremity tremor but neurological examination of lower extremities unchanged.  VDRL nonreactive.  Many other tests remain pending.  ID following.  Agree with holding of Sinemet for now.  Differential for tremor and gait issues quite broad at this time.  Will await results of full work up.  Recommendations: 1.  Will continue to follow with you   LOS: 4 days   Alexis Goodell, MD [facility] [phone] 06/11/2013  10:35 AM

## 2013-06-11 NOTE — Discharge Summary (Signed)
[facility] Discharge Summary  Patient name: Shaun Lutz Medical record number: [id number] Date of birth: 05-02-1955 Age: 58 y.o. Gender: male Date of Admission: 06/07/2013  Date of Discharge: 06/21/2013 Admitting Physician: Andrena Mews, MD  Primary Care Provider: Donnamae Jude, MD Consultants: ID, cards, Neuro  Indication for Hospitalization: gait abnormalities, tremor   Discharge Diagnoses/Problem List:  Neurosyph HIV Dementia  HTN 2nd degree AV block s/p pacemaker Depression  Disposition: transfer to CIR  Discharge Condition: stable  Discharge Exam:  BP 108/72  Pulse 72  Temp(Src) 97.3 F (36.3 C) (Oral)  Resp 18  Ht 5' 8.11" (1.73 m)  Wt 119 lb 11.4 oz (54.3 kg)  BMI 18.14 kg/m2  SpO2 94% Gen: NAD;confused, intermittently agitated   HEENT: poor dentition, shallow ulceration on right buccal mucosa improved  CV: RRR without definite murmur  Resp: CTABL, no wheezes, non-labored, moving air  Abd: thin, scaphoid, SNTND, BS present, no guarding or organomegaly  Ext: thin extremities, warm  Skin: vitiligo, multiple hyperpigmented macules throughout entire body including palms and soles; scattered excoriations, multiple urticarial like patches scattered across upper and lower extremities, skin sloughing and break down worse in the buttocks region   Brief Hospital Course:  Thiago Ragsdale is a 58 y.o. 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 PMH is significant for HTN, second degree AV block s/p pacemaker placement, vitiligo, recent dx HIV/syphilis  # ID-Pt initially presenting to clinic with worsening gait (inc tripping, stooped posture, steppage walk) in the last 1-2 years accompanied by onset of tremor (mostly in upper extremities) approx 6 months ago. Does have a history of falls (but no falls since placement of pacemaker in 2012. Pt also  noted to have memory difficulties and hearing loss both acutely worsening in last 3 weeks. Presented to PCP for further w/up where was referred to neuro for eval. Was seen by [facility] on 4/6 where pt thought to have component of idiopathic parkison's disease given tremor, bradykinesia, rigidity and postural instability. Lab work sent given memory concerns including B12, RPR, Na, HIV and hgbA1C. Pt subsequently found to have post HIV 1 western blot and RPR 1:512 with P pallidum Ab >8. Was instructed to return to Neuro 4/8 where concerned was voiced for neurosyphilis in addition to parkinsons. Pt thus was sent to the ED for LP/further infectious w/up. On admission LP obtained with very low glucose of 7, protein >600, WBC 86 with 60% n; CT head/spine obtained no acute process, L temportal ischmic changes (likely consistent with HIV), prominent ventricles, no spinal outlet obstruction. ID intially recommending Vanc, rocephin, high dose PCN, acyclovir. LP repeated for concern for superimposed infection ie toxo, TB or malig process leptomeningeal carcinomatosis. Studies sent including: CSF culture / studies TB by PCR (neg), HSV by PCR (NEGATIVE) ,fungal with smear (negative smear, Cx neg), AFB with smear (negative smear, Cx neg), viral culture neg.  Abx coverage narrowed to high dose PCN, with plan for 14 day course. ARV meds (STRIBILD) started 4/17 once CSF TB neg confirmed. Pt completed 11 days course of PCN and 2 days course of ARV prior to developing a possible hypersensitivity reaction. Was transferred to SDU for closer monitoring, placed on IV solumedrol and benadryl. STRIBILD and PCN d/c. Switched to high dose CTX . Steroid tapered off and benadryl switched to loratadine. Completed a total of 13 day course abx treatment for neurosyph. Was unable to receive  final dose as pt pulled out IVF during moment of acute agitation. Was successfully started on TIVIAY and TRUVADA on 4/21. Will need outpt follow with with serial  RPR, repeat LP with VDRL per neuro.   #Neuro Patient initially presenting with Gait abnormality, hearing loss, cogwheel rigidity; Felt that pt likely patient having features related to progression of infectious disease (see above). Possible component of parkisons given dysmetria, memory loss, rigidity and this was initial thought from neuro upon evaluation. Neuro prescribed carbidopa-levodopa but given that pt had not started taking, was held. Pt/OT eval'd and felt to be a candidate for CIR. Was stable for transfer to d/c.   #CV-Pt with hx of HTN/second degree AV block s/p pacemaker placement 2012. 2D echo obtained 4/10 for concern of gummas/aneurysms but was largely unchanged from previous. Pt initially cont on BB and lisinopril. But lisinopril d/c'd with low-normal BPs.  #Pulm- Questionable COPD? Bullous changes seen on CT chest with bilat infiltrates. Monitored closely for resp compromise given above. Was sating well on room air.    #Psych/Social : Pt initially placed on zoloft but developed some confusion, loss of BMs. Intermittently waxing/waning Suicidal ideation in light of recent diagnosis and forgetfulness. Sitter placed for passive thoughts. Psych eval'd recommended lexapro. Pt also placed on haldol for further nighttime control.  Issues for Follow Up:  1. Acute rehab needs 2. Additional high dose treatment per ID 3. Delayed skin reaction to high dose therapy 4. Improvement of hyponatremia   Significant Procedures: LP X2  Significant Labs and Imaging:   Recent Labs Lab 06/15/13 1500 06/19/13 0626  WBC 12.5* 12.2*  HGB 11.5* 12.0*  HCT 35.4* 35.9*  PLT 255 294    Recent Labs Lab 06/15/13 1500 06/19/13 0626 06/19/13 1316  NA 138 126* 129*  K 4.6 5.4* 5.0  CL 97 89* 89*  CO2 29 25 22   GLUCOSE 96 117* 296*  BUN 10 14 16   CREATININE 0.78 0.86 0.78  CALCIUM 8.8 8.6 9.0  ALKPHOS 106 86  --   AST 40* 35  --   ALT 70* 49  --   ALBUMIN 2.6* 2.4*  --    06/07/2013 15:55   Glucose, CSF  7 (LL)  Total Protein, CSF  >600 (H)  RBC Count, CSF  0  WBC, CSF  86 (HH)  Segmented Neutrophils-CSF  60 (H)  Lymphs, CSF  35 (L)  Monocyte-Macrophage-Spinal Fluid  5 (L)  Eosinophils, CSF  NONE SEEN  Appearance, CSF  CLEAR (A)  Color, CSF  YELLOW (A)  Supernatant  XANTHOCHROMIC  Tube #  1    Results/Tests Pending at Time of Discharge: none  Discharge Medications:    Medication List    STOP taking these medications       carbidopa-levodopa 25-100 MG per tablet  Commonly known as:  SINEMET IR     lisinopril 5 MG tablet  Commonly known as:  PRINIVIL,ZESTRIL      TAKE these medications       aspirin 81 MG tablet  Take 1 tablet (81 mg total) by mouth daily.     atenolol 25 MG tablet  Commonly known as:  TENORMIN  Take 25 mg by mouth daily.     dolutegravir 50 MG tablet  Commonly known as:  TIVICAY  Take 1 tablet (50 mg total) by mouth daily.     emtricitabine-tenofovir 200-300 MG per tablet  Commonly known as:  TRUVADA  Take 1 tablet by mouth daily.  ENSURE ACTIVE HIGH PROTEIN Liqd  Take 1 Can by mouth 3 (three) times daily.     feeding supplement (ENSURE COMPLETE) Liqd  Take 237 mLs by mouth 3 (three) times daily.     escitalopram 5 MG tablet  Commonly known as:  LEXAPRO  Take 1 tablet (5 mg total) by mouth at bedtime.     haloperidol 0.5 MG tablet  Commonly known as:  HALDOL  Take 1 tablet (0.5 mg total) by mouth at bedtime.     heparin 5000 UNIT/ML injection  Inject 1 mL (5,000 Units total) into the skin every 8 (eight) hours.     insulin aspart 100 UNIT/ML injection  Commonly known as:  novoLOG  Inject 0-5 Units into the skin at bedtime.     insulin aspart 100 UNIT/ML injection  Commonly known as:  novoLOG  Inject 0-9 Units into the skin 3 (three) times daily with meals.        Discharge Instructions: Please refer to Patient Instructions section of EMR for full details.  Patient was counseled important signs and  symptoms that should prompt return to medical care, changes in medications, dietary instructions, activity restrictions, and follow up appointments.    Langston Masker, MD 06/21/2013, 1:53 PM PGY-1, [place]

## 2013-06-11 NOTE — Progress Notes (Signed)
Family Medicine Teaching Service Daily Progress Note Intern Pager: [phone]  Patient name: Shaun Lutz Medical record number: [id number] Date of birth: 03/18/1955 Age: 58 y.o. Gender: male  Primary Care Provider: Donnamae Jude, MD Consultants: ID, neuro Code Status: full  Pt Overview and Major Events to Date: Shaun Lutz is a 58 y.o. 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 Lucianne Lei Dam's note 4/11: soiled himself overnight, awoke confused, so Zoloft discontinued; otherwise little change  Assessment and Plan:  # ID- HIV positive, Syphilis positive; question meningitis  Ddx includes Neurosyphilis (with reactive RPR and T.pallidum Ab >8) vs HIV CNS involvement (toxo, lymphoma, PML, encephalopathy,CMV, fungal) vs Viral menigitis. May be multifactorial. No evidence of spinal outflow obstruction by CT spine. CD4 count 250. 2D echo grossly normal. - await diagnostics from repeat LP and pending labs for HIV prior to starting any antiretroviral meds -f/up repeat LP results   - CSF culture / studies (see results review)  - TB by PCR  - HSV by PCR (NEGATIVE)  - fungal with smear (negative smear, Cx pending)  - AFB with smear (negative smear, Cx pending)  - viral culture -ID following greatly appreciate recs and management -covering with high-dose Pen-G, acyclovir; defer to ID for narrowing as clinically appropriate   #Neuro - little change since admission, other than slight improvement in UE resting/intention tremor on finger-nose - Gait  abnormality, hearing loss, CNS infection, cogwheel rigidity; DDx includes sequelae of HIV/syphilis vs parkinsonian features vs demyelinating d/o vs NPH vs vitamin deficiency vs metabolic abnormality. Likely patient having features related to progression of infectious disease (see above). Possible component of parkisons given dysmetria, memory loss, rigidity. 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  - 2D echo 4/10 grossly normal, Currently normotensive, intermittently bradycardic (with a pacer).  - cont lisinopril  - hold atenolol, pacer interrogated; consider formal cards consult if needed - cont ASA   #Pulm- COPD? Bullous changes seen on CT chest with bilat infiltrates -no prescriptions for COPD currently  -vitals per floor protocol, will cont to monitor with pulse ox   #Psych/Social Suicidal ideation- in light of recent diagnosis, denies SI currently and states hasnt had any SI in a week; girlfriend feels he is severely depressed - no 1:1 at this time, but will continually reassess  - D/C Zoloft and monitor clinically for now (had loose stool and ?increased confusion overnight 4/10 after starting)  #FEN/GI: heart healthy diet, saline lock IV; replete electrolytes prn  Disposition: management as above with extensive further work-up pending (unknown infectious etiology vs malignancy vs neurodegenerative d/o); discharge planning pending numerous results and appropriate therapeutic decisions to be made, as above  Subjective: Pt alert / awake this morning, in no distress. States he feels "fairly cheerful" and "thanks God every day [he] wakes up." Denies pain.  States his appetite is okay.     Objective: Temp:  [97.5 F (36.4 C)-97.8 F (36.6 C)] 97.5 F (36.4 C) (04/12 0500) Pulse Rate:  [56-70] 56 (04/12 0500) Resp:  [18-22] 18 (04/12 0500) BP: (104-135)/(58-97) 104/58 mmHg (04/12 0500) SpO2:  [98 %-99 %] 98 % (04/12 0500) Physical Exam: Gen: adult male appears older than stated age, in NAD; intermittently confused but cooperative with exam  HEENT: NCAT, EOMI with horizontal nystagmus, PER, pinpoint and minimally responsive to light; poor dentition; no palatal lesions, white discoloration of tongue remains Neck: normal ROM, supple, no rigidity CV: irregularly irregularly without definite murmur, cap refill <3  Resp: CTABL, no wheezes, non-labored  Abd: thin, scaphoid, SNTND, BS present, no guarding or organomegaly  Ext: thin extremities, warm, normal tone, Neuro: orientation not specifically asked this AM; otherwise little change in neuro status  Mild / subtle resting tremor in UE at baseline; unchanged to slightly improved intention tremor on finger-nose test Skin: vitiligo, multiple hyperpigmented macules throughout entire body including palms and soles;   scattered excoriations without evidence for skin superinfection  Laboratory:  Recent Labs Lab 06/07/13 1010 06/07/13 2025  WBC 4.9 4.3  HGB 14.0 12.6*  HCT 39.9 36.4*  PLT 195 193    Recent Labs Lab 06/05/13 1033 06/07/13 1010 06/07/13 2025  NA 133* 136*  --   K  --  4.0  --   CL  --  97  --   CO2  --  30  --   BUN  --  12  --   CREATININE  --  0.85 0.68  CALCIUM  --  9.0  --   PROT  --  8.3  --   BILITOT  --  0.5  --   ALKPHOS  --  77  --   ALT  --  20  --   AST  --  18  --   GLUCOSE  --  140*  --    utox neg  U/a neg apart from 2.0 protein  CSF gram stain no organism  Crypto ag neg  See results review and A/P for extensive CSF study results  Imaging/Diagnostic Tests: CT head 06/07/13 IMPRESSION:  1. Hypoattenuation in the left temporal tip appears parenchymal and  are likely related to  ischemia of and an extra-axial arachnoid cyst.  2. Diffuse subcortical white matter hypoattenuation bilaterally is  stable. 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  3. Mild prominence the ventricles bilaterally is likely due to  atrophy. Mild hydrocephalus is considered less likely. Lumbar  puncture may be useful for further evaluation.   CT cervical spine 06/08/13 IMPRESSION:  1. No significant para meningeal focus of infection is identified.  2. Multilevel degenerative cervical spine disease as described.  3. Stable vertebral body heights and alignment.  4. Hypodense lesion within the right parotid gland measures 10 x 15  mm. This could represent a hyperdense lymph node associated with  HIV. However, no other significant adenopathy is present. A primary  parotid lesion is also considered.  5. Inguinal lymph nodes bilaterally are likely related to HIV.  6. Bilateral pleural effusions. Infection is not excluded.  7. Mass like area dependently in the left lower lobe likely  represents rounded atelectasis or potentially some fluid trapped  within the fissure.  8. 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   Sharon Mt Norina Cowper, MD 06/11/2013, 9:02 AM PGY-2, [place] Intern pager: [phone], text pages welcome

## 2013-06-11 NOTE — Progress Notes (Signed)
I discussed with  Dr Venetia Maxon.  I agree with their plans documented in their progress note for today.

## 2013-06-12 ENCOUNTER — Encounter: Payer: Self-pay | Admitting: *Deleted

## 2013-06-12 DIAGNOSIS — G039 Meningitis, unspecified: Secondary | ICD-10-CM | POA: Diagnosis not present

## 2013-06-12 DIAGNOSIS — R269 Unspecified abnormalities of gait and mobility: Secondary | ICD-10-CM | POA: Diagnosis not present

## 2013-06-12 DIAGNOSIS — A879 Viral meningitis, unspecified: Secondary | ICD-10-CM | POA: Diagnosis not present

## 2013-06-12 DIAGNOSIS — B20 Human immunodeficiency virus [HIV] disease: Secondary | ICD-10-CM | POA: Diagnosis not present

## 2013-06-12 DIAGNOSIS — A523 Neurosyphilis, unspecified: Secondary | ICD-10-CM | POA: Diagnosis not present

## 2013-06-12 DIAGNOSIS — R55 Syncope and collapse: Secondary | ICD-10-CM

## 2013-06-12 DIAGNOSIS — A539 Syphilis, unspecified: Secondary | ICD-10-CM | POA: Diagnosis not present

## 2013-06-12 DIAGNOSIS — F1021 Alcohol dependence, in remission: Secondary | ICD-10-CM

## 2013-06-12 LAB — PATHOLOGIST SMEAR REVIEW

## 2013-06-12 MED ORDER — LORAZEPAM 2 MG/ML IJ SOLN
1.0000 mg | Freq: Once | INTRAMUSCULAR | Status: AC
  Administered 2013-06-12: 1 mg via INTRAVENOUS
  Filled 2013-06-12: qty 1

## 2013-06-12 NOTE — Progress Notes (Signed)
NUTRITION FOLLOW-UP  DOCUMENTATION CODES Per approved criteria  -Non-severe (moderate) malnutrition in the context of chronic illness -Underweight   Pt meets criteria for moderate MALNUTRITION in the context of chronic illness as evidenced by moderate muscle and fat mass loss.  INTERVENTION: Continue Ensure Complete po TID, each supplement provides 350 kcal and 13 grams of protein. Recommend assistance with meals, if no family present to assist. RD to continue to follow nutrition care plan.  NUTRITION DIAGNOSIS: Increased nutrient needs related to HIV as evidenced by estimated needs. Ongoing.  Goal: Intake to meet >90% of estimated nutrition needs. Unmet.  Monitor:  weight trends, lab trends, I/O's, PO intake, supplement tolerance  ASSESSMENT: Admitted with gait abnormalities. Work-up reveals syphilis, HIV positive, possible meningitis.  LP on 4/8, repeated on 4/10. Meningitis studies pending.  Pt with severe panic attacks this morning - confirmed with RN. Intake is limited, pt is consuming about 25% of meals. Continues with orders for Ensure Complete PO TID.  Sodium low at 136 HgbA1c is 6  Height: Ht Readings from Last 1 Encounters:  06/09/13 5' 8.11" (1.73 m)    Weight: Wt Readings from Last 1 Encounters:  06/09/13 113 lb 8 oz (51.483 kg)  Admit wt 115 lb  BMI:  Body mass index is 17.2 kg/(m^2). Underweight  Estimated Nutritional Needs: Kcal: 1550 - 1800 kcal Protein: 60 - 75 g Fluid: 1.6 - 1.8 liters  Skin: intact  Diet Order: Dysphagia 3; Heart Healthy    Intake/Output Summary (Last 24 hours) at 06/12/13 1100 Last data filed at 06/12/13 0900  Gross per 24 hour  Intake    600 ml  Output      0 ml  Net    600 ml    Last BM: 4/12  Labs:   Recent Labs Lab 06/07/13 1010 06/07/13 2025  NA 136*  --   K 4.0  --   CL 97  --   CO2 30  --   BUN 12  --   CREATININE 0.85 0.68  CALCIUM 9.0  --   GLUCOSE 140*  --     CBG (last 3)  No results  found for this basename: GLUCAP,  in the last 72 hours Lab Results  Component Value Date   HGBA1C 6.0* 06/05/2013    Scheduled Meds: . aspirin  81 mg Oral Daily  . atenolol  25 mg Oral Daily  . feeding supplement (ENSURE COMPLETE)  237 mL Oral TID  . heparin  5,000 Units Subcutaneous 3 times per day  . lisinopril  5 mg Oral Daily  . pencillin G potassium IV  4 Million Units Intravenous Q4H  . sodium chloride  3 mL Intravenous Q12H    Continuous Infusions:   Inda Coke MS, RD, LDN Inpatient Registered Dietitian Pager: [phone] After-hours pager: [phone]

## 2013-06-12 NOTE — Progress Notes (Signed)
[facility]   Day # 6 penicillin    Subjective: Sleepy post ativan  Antibiotics:  Anti-infectives   Start     Dose/Rate Route Frequency Ordered Stop   06/08/13 0200  penicillin G potassium 4 Million Units in dextrose 5 % 250 mL IVPB     4 Million Units 250 mL/hr over 60 Minutes Intravenous Every 4 hours 06/08/13 0033     06/07/13 2200  cefTRIAXone (ROCEPHIN) 2 g in dextrose 5 % 50 mL IVPB  Status:  Discontinued     2 g 100 mL/hr over 30 Minutes Intravenous Every 12 hours 06/07/13 1847 06/07/13 1852   06/07/13 2200  cefTRIAXone (ROCEPHIN) 2 g in dextrose 5 % 50 mL IVPB  Status:  Discontinued    Comments:  Please draw blood cultures before antibiotics   2 g 100 mL/hr over 30 Minutes Intravenous Every 12 hours 06/07/13 1852 06/08/13 1459   06/07/13 2200  acyclovir (ZOVIRAX) 525 mg in dextrose 5 % 100 mL IVPB  Status:  Discontinued     10 mg/kg  52.5 kg 110.5 mL/hr over 60 Minutes Intravenous 3 times per day 06/07/13 1915 06/09/13 1041   06/07/13 2000  vancomycin (VANCOCIN) IVPB 750 mg/150 ml premix  Status:  Discontinued     750 mg 150 mL/hr over 60 Minutes Intravenous Every 12 hours 06/07/13 1915 06/08/13 1459   06/07/13 2000  Ampicillin-Sulbactam (UNASYN) 3 g in sodium chloride 0.9 % 100 mL IVPB  Status:  Discontinued     3 g 100 mL/hr over 60 Minutes Intravenous Every 6 hours 06/07/13 1915 06/08/13 0033      Medications: Scheduled Meds: . aspirin  81 mg Oral Daily  . atenolol  25 mg Oral Daily  . feeding supplement (ENSURE COMPLETE)  237 mL Oral TID  . heparin  5,000 Units Subcutaneous 3 times per day  . lisinopril  5 mg Oral Daily  . pencillin G potassium IV  4 Million Units Intravenous Q4H  . sodium chloride  3 mL Intravenous Q12H   Continuous Infusions:  PRN Meds:.    Objective: Weight change:   Intake/Output Summary (Last 24 hours) at 06/12/13 1338 Last data filed at 06/12/13 0900  Gross per 24 hour  Intake    360 ml    Output      0 ml  Net    360 ml   Blood pressure 137/88, pulse 72, temperature 98.1 F (36.7 C), temperature source Oral, resp. rate 18, height 5' 8.11" (1.73 m), weight 113 lb 8 oz (51.483 kg), SpO2 100.00%. Temp:  [97.7 F (36.5 C)-98.1 F (36.7 C)] 98.1 F (36.7 C) (04/13 1000) Pulse Rate:  [66-72] 72 (04/13 1000) Resp:  [18-20] 18 (04/13 1000) BP: (121-157)/(79-96) 137/88 mmHg (04/13 1000) SpO2:  [95 %-100 %] 100 % (04/13 1000)  Physical Exam: AO x 3 not nearly as HOH as yesterday HEENT: anicteric sclera, pupils reactive to light and accommodation, EOMI,  CVS regular rate, normal r, no murmur rubs or gallops  Chest: clear to auscultation bilaterally, no wheezing, rales or rhonchi  Abdomen: soft nontender, nondistended, normal bowel sounds,  Extremities: no clubbing or edema noted bilaterally  Skin: The like go along with hyperpigmented lesions seen below and pictures  Neuro:  nonfocal  CBC:  Recent Labs Lab 06/07/13 1010 06/07/13 2025  HGB 14.0 12.6*  HCT 39.9 36.4*  PLT 195 193  INR 1.00  --      BMET No results  found for this basename: NA, K, CL, CO2, GLUCOSE, BUN, CREATININE, CALCIUM,  in the last 72 hours   Liver Panel  No results found for this basename: PROT, ALBUMIN, AST, ALT, ALKPHOS, BILITOT, BILIDIR, IBILI,  in the last 72 hours     Sedimentation Rate No results found for this basename: ESRSEDRATE,  in the last 72 hours C-Reactive Protein No results found for this basename: CRP,  in the last 72 hours  Micro Results: Recent Results (from the past 240 hour(s))  GRAM STAIN     Status: None   Collection Time    06/07/13  3:55 PM      Result Value Ref Range Status   Specimen Description CSF   Final   Special Requests 6.0ML FLUID   Final   Gram Stain     Final   Value: CYTOSPIN PREP     WBC PRESENT, PREDOMINANTLY PMN     NO ORGANISMS SEEN   Report Status 06/07/2013 FINAL   Final  CSF CULTURE     Status: None   Collection Time    06/07/13   3:55 PM      Result Value Ref Range Status   Specimen Description CSF   Final   Special Requests 6.0ML CSF FLUID   Final   Gram Stain     Final   Value: WBC PRESENT,BOTH PMN AND MONONUCLEAR     NO ORGANISMS SEEN     Performed at [facility] CYTOSPIN     Performed at [facility]   Culture     Final   Value: NO GROWTH 3 DAYS     Performed at [facility]   Report Status 06/10/2013 FINAL   Final  FUNGUS CULTURE W SMEAR     Status: None   Collection Time    06/09/13  1:33 PM      Result Value Ref Range Status   Specimen Description CSF   Final   Special Requests 9.0ML   Final   Fungal Smear     Final   Value: NO YEAST OR FUNGAL ELEMENTS SEEN     Performed at [facility]   Culture     Final   Value: CULTURE IN PROGRESS FOR FOUR WEEKS     Performed at [facility]   Report Status PENDING   Incomplete  AFB CULTURE WITH SMEAR     Status: None   Collection Time    06/09/13  1:33 PM      Result Value Ref Range Status   Specimen Description CSF   Final   Special Requests 9.0ML FLUID   Final   ACID FAST SMEAR     Final   Value: NO ACID FAST BACILLI SEEN     Performed at [facility]   Culture     Final   Value: CULTURE WILL BE EXAMINED FOR 6 WEEKS BEFORE ISSUING A FINAL REPORT     Performed at [facility]   Report Status PENDING   Incomplete  CLOSTRIDIUM DIFFICILE BY PCR     Status: None   Collection Time    06/11/13  6:05 PM      Result Value Ref Range Status   C difficile by pcr NEGATIVE  NEGATIVE Final    Studies/Results: No results found.    Assessment/Plan:  Active Problems:   ATRIOVENTRICULAR BLOCK, MOBITZ TYPE II   Pacemaker- medtronic   Abnormality of gait   Human immunodeficiency virus (HIV)  disease   Lower extremity weakness   Syphilis   Malnutrition of moderate degree    Shaun Lutz is a 58 y.o. male with  with COPD, Pacemaker (medtronic 2012) frequent falls gait abnormality found to have HIV and  syphilis with a high titer of 1-512, status post CT imaging of the brain a lumbar puncture with high white count extremely low glucose and very high protein.   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 but he could have 2-3 other CNS pathologies going on as well  With re to VZV--those antibodies were on SERUM and not on CSF, therefore this is NOT evidence for VZV vasculopathy  INformed by Flow that they do not have enough cells for flow cytology, only cytology  AFB and fungal are not seen on stains  MTB PCR not back yet    #2 HIV/AIDS: he will need HIV GENO and HIV INI GENO  ---we should NOT start ARV until we understand what kind of CNS pathology we are dealing with as there would be a high risk of immune reconstitution inflammatory syndrome with for example tuberculosis meningitis.   --would be prudent to see what kind of drug coverage he has with medicare because if it is a lousy one he will need the SPAP (AIDS drug assistance federal program) would consult social work to see if would cover antiviral meds such as STRIBILD, Tivicay and Truvada    LOS: 5 days   Truman Hayward 06/12/2013, 1:38 PM

## 2013-06-12 NOTE — Progress Notes (Signed)
FMTS Attending Daily Note:  Shaun Sabal MD  [phone] pager  Family Practice pager:  [phone] I have seen and examined this patient and have reviewed their chart. I have discussed this patient with the resident. I agree with the resident's findings, assessment and care plan.  Additionally:  Less agitated than this AM, now pleasantly demented.  Much talk about race riots of 1960's.  No complaints.  Eating and drinking well currently.    Shaun Reasons, MD 06/12/2013 2:48 PM

## 2013-06-13 DIAGNOSIS — A879 Viral meningitis, unspecified: Secondary | ICD-10-CM | POA: Diagnosis not present

## 2013-06-13 DIAGNOSIS — R269 Unspecified abnormalities of gait and mobility: Secondary | ICD-10-CM | POA: Diagnosis not present

## 2013-06-13 DIAGNOSIS — B20 Human immunodeficiency virus [HIV] disease: Secondary | ICD-10-CM | POA: Diagnosis not present

## 2013-06-13 DIAGNOSIS — A539 Syphilis, unspecified: Secondary | ICD-10-CM | POA: Diagnosis not present

## 2013-06-13 DIAGNOSIS — G039 Meningitis, unspecified: Secondary | ICD-10-CM | POA: Diagnosis not present

## 2013-06-13 DIAGNOSIS — A523 Neurosyphilis, unspecified: Secondary | ICD-10-CM | POA: Diagnosis not present

## 2013-06-13 NOTE — Progress Notes (Signed)
   CARE MANAGEMENT NOTE 06/13/2013  Patient:  Shaun Lutz, Shaun Lutz   Account Number:  [id number]  Date Initiated:  06/13/2013  Documentation initiated by:  Lizabeth Leyden  Subjective/Objective Assessment:   admitted with AMS, gait disturbance, tremors     Action/Plan:   progression of care and discharge planning  active with [facility] PT prior to admission   Anticipated DC Date:     Anticipated DC Plan:        [place]  CM consult      [facility] Choice  Resumption Of Svcs/PTA Provider   Choice offered to / List presented to:             Status of service:   Medicare Important Message given?   (If response is "NO", the following Medicare IM given date fields will be blank) Date Medicare IM given:   Date Additional Medicare IM given:    Discharge Disposition:    Per UR Regulation:    If discussed at Long Length of Stay Meetings, dates discussed:   06/13/2013    Comments:  06/13/2013  [place] [place] [facility] called to advise patient active with HHPT prior to admission.

## 2013-06-13 NOTE — Progress Notes (Signed)
FMTS Attending Daily Note:  Annabell Sabal MD  [phone] pager  Family Practice pager:  [phone] I have seen and examined this patient and have reviewed their chart. I have discussed this patient with the resident. I agree with the resident's findings, assessment and care plan.  Additionally:  A little less confused today.  Agitation has resolved.  Awaiting multiple lab results.  Greatly appreciate ID and Neuro's input and recommendations.    [name], MD 06/13/2013 9:50 AM

## 2013-06-13 NOTE — Progress Notes (Signed)
Family Medicine Teaching Service Daily Progress Note Intern Pager: [phone]  Patient name: Shaun Lutz Medical record number: [id number] Date of birth: [birth year] Age: 58 y.o. Gender: male  Primary Care Provider: Donnamae Jude, MD Consultants: ID, neuro Code Status: full  Pt Overview and Major Events to Date: Shaun Lutz is a 58 y.o. 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 Lucianne Lei Dam's note 4/11: soiled himself overnight, awoke confused, so Zoloft discontinued; otherwise little change 4/13: severe panic attacks, perseverative, ativan 75m 4/14: HIV med assistance and HIV genomic studies 4/15: CM/SW and PT/OT to eval regarding placement  Assessment and Plan: Shaun Lamereis a 58y.o. 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  - await diagnostics from repeat LP and pending labs for HIV prior to starting any antiretroviral meds; thus far negative -f/up repeat LP results   - CSF culture / studies (see results review)  - TB by PCR (in process)  - HSV by PCR (NEGATIVE)  - fungal with  smear (negative smear, Cx pending)  - AFB with smear (negative smear, Cx pending)  - viral culture (prelim neg X4 days)  - cytology (no malig cells) -ID following greatly appreciate recs and management -covering with high-dose Pen-G IV for 2 weeks duration(started 4/9--) -HIV genomic studies in process:   - HIV RNA quant approx 60,000; CD4 250  - HIV integrade genotype/genotype plus in process   - HLA B*5701 -consult SW for ARV/drug coverage  #Neuro - Gait abnormality, hearing loss, CNS infection, cogwheel rigidity  DDx includes sequelae of HIV/syphilis vs parkinsonian features vs demyelinating d/o.Likely patient having features related to progression of infectious disease (see above).  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  - 2D echo 4/10 grossly normal, Currently normotensive, intermittently bradycardic (with a pacer).  - cont lisinopril  - hold atenolol, pacer interrogated; consider formal cards consult if needed - cont ASA   #Pulm- COPD? Bullous changes seen on CT chest with bilat infiltrates -no prescriptions for COPD currently  -vitals per floor protocol, will cont to monitor with pulse ox   #Psych/Social Suicidal ideation- in light of recent diagnosis, denies SI currently and states hasnt had any SI > 1 week; less labile this morning - no 1:1 at this time, but will continually reassess  - hold Zoloft and monitor clinically for now   #FEN/GI: heart healthy diet, saline lock IV; replete electrolytes prn -hold PO if patient anxious  Disposition: management as above with extensive further work-up  pending (unknown infectious etiology vs malignancy vs neurodegenerative d/o); discharge planning pending PT/OT evals; possible SNF placement to complete IV abx  course  Subjective: 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; no emotional spells; thinks it is 2007 "please dont send me to the crazy house"   Objective: Temp:  [97.7 F (36.5 C)-98.2 F (36.8 C)] 98 F (36.7 C) (04/14 1800) Pulse Rate:  [68-81] 68 (04/14 1800) Resp:  [18] 18 (04/14 1800) BP: (94-125)/(60-81) 94/60 mmHg (04/14 1800) SpO2:  [95 %-98 %] 98 % (04/14 1800) Physical Exam: Gen: adult male appears older than stated age, in NAD; cooperative with exam this morning  HEENT: NCAT, EOMI, PERRL, poor dentition; no palatal lesions, white discoloration of tongue remains Neck: normal ROM, supple, no rigidity CV: irreg irreg without definite murmur, cap refill <3  Resp: CTABL, no wheezes, non-labored  Abd: thin, scaphoid, SNTND, BS present, no guarding or organomegaly  Ext: thin extremities, warm, normal tone, Neuro: oriented to place and self only; otherwise little change in neuro status; Mild / subtle resting tremor in UE at baseline worsened with anxiety and movement; still with dysmetria on FTN testing Skin: vitiligo, multiple hyperpigmented macules throughout entire body including palms and soles; scattered excoriations without evidence for skin superinfection  Laboratory:  Recent Labs Lab 06/07/13 1010 06/07/13 2025  WBC 4.9 4.3  HGB 14.0 12.6*  HCT 39.9 36.4*  PLT 195 193    Recent Labs Lab 06/07/13 1010 06/07/13 2025  NA 136*  --   K 4.0  --   CL 97  --   CO2 30  --   BUN 12  --   CREATININE 0.85 0.68  CALCIUM 9.0  --   PROT 8.3  --   BILITOT 0.5  --   ALKPHOS 77  --   ALT 20  --   AST 18  --   GLUCOSE 140*  --    utox neg  U/a neg apart from 2.0 protein  CSF gram stain no organism  Crypto ag neg  See results review and A/P for extensive CSF study results  Imaging/Diagnostic Tests: CT head 06/07/13 IMPRESSION:  1. Hypoattenuation in the left temporal tip appears parenchymal and  are likely related to ischemia of and an extra-axial arachnoid cyst.  2.  Diffuse subcortical white matter hypoattenuation bilaterally is  stable. 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  3. Mild prominence the ventricles bilaterally is likely due to  atrophy. Mild hydrocephalus is considered less likely. Lumbar  puncture may be useful for further evaluation.   CT cervical spine 06/08/13 IMPRESSION:  1. No significant para meningeal focus of infection is identified.  2. Multilevel degenerative cervical spine disease as described.  3. Stable vertebral body heights and alignment.  4. Hypodense lesion within the right parotid gland measures 10 x 15  mm. This could represent a hyperdense lymph node associated with  HIV. However, no other significant adenopathy is present. A primary  parotid lesion is also considered.  5. Inguinal lymph nodes bilaterally are likely related to HIV.  6. Bilateral pleural effusions. Infection is not excluded.  7. Mass like area dependently in the left lower lobe likely  represents rounded atelectasis or potentially some fluid trapped  within the fissure.  8. 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   Langston Masker, MD 06/13/2013, 9:02 PM PGY-1, [place] Intern pager: [phone], text pages welcome

## 2013-06-13 NOTE — Progress Notes (Addendum)
Subjective: No complaints at this time. Feeling well. No tremor noted  Objective: Current vital signs: BP 117/81  Pulse 81  Temp(Src) 97.7 F (36.5 C) (Oral)  Resp 18  Ht 5' 8.11" (1.73 m)  Wt 51.483 kg (113 lb 8 oz)  BMI 17.20 kg/m2  SpO2 95% Vital signs in last 24 hours: Temp:  [97.7 F (36.5 C)-98 F (36.7 C)] 97.7 F (36.5 C) (04/14 0500) Pulse Rate:  [73-81] 81 (04/14 0500) Resp:  [18] 18 (04/14 0500) BP: (117-150)/(81-87) 117/81 mmHg (04/14 0500) SpO2:  [95 %-100 %] 95 % (04/14 0500)  Intake/Output from previous day: 04/13 0701 - 04/14 0700 In: 600 [P.O.:600] Out: 850 [Urine:850] Intake/Output this shift: Total I/O In: -  Out: 150 [Urine:150] Nutritional status: Dysphagia  Neurologic Exam: Mental Status: Alert, oriented, thought content appropriate.  Speech fluent without evidence of aphasia.  Able to follow 3 step commands without difficulty. Cranial Nerves: II:  Visual fields grossly normal, pupils equal, round, reactive to light and accommodation III,IV, VI: ptosis not present, extra-ocular motions intact bilaterally V,VII: smile symmetric, facial light touch sensation normal bilaterally VIII: hearing normal bilaterally IX,X: gag reflex present XI: bilateral shoulder shrug XII: midline tongue extension without atrophy or fasciculations  Motor: Right : Upper extremity   5/5    Left:     Upper extremity   5/5  Lower extremity   5/5     Lower extremity   5/5 No tremor noted Sensory: Pinprick and light touch intact throughout, bilaterally Deep Tendon Reflexes:  Right: Upper Extremity   Left: Upper extremity   biceps (C-5 to C-6) 2/4   biceps (C-5 to C-6) 2/4 tricep (C7) 2/4    triceps (C7) 2/4 Brachioradialis (C6) 2/4  Brachioradialis (C6) 2/4  Lower Extremity Lower Extremity  quadriceps (L-2 to L-4) 2/4   quadriceps (L-2 to L-4) 2/4 Achilles (S1) 2/4   Achilles (S1) 2/4  Plantars: Right: downgoing   Left: downgoing    Lab Results: Basic  Metabolic Panel:  Recent Labs Lab 06/07/13 1010 06/07/13 2025  NA 136*  --   K 4.0  --   CL 97  --   CO2 30  --   GLUCOSE 140*  --   BUN 12  --   CREATININE 0.85 0.68  CALCIUM 9.0  --     Liver Function Tests:  Recent Labs Lab 06/07/13 1010  AST 18  ALT 20  ALKPHOS 77  BILITOT 0.5  PROT 8.3  ALBUMIN 3.4*   No results found for this basename: LIPASE, AMYLASE,  in the last 168 hours No results found for this basename: AMMONIA,  in the last 168 hours  CBC:  Recent Labs Lab 06/07/13 1010 06/07/13 2025  WBC 4.9 4.3  NEUTROABS 2.3  --   HGB 14.0 12.6*  HCT 39.9 36.4*  MCV 90.1 89.4  PLT 195 193    Cardiac Enzymes:  Recent Labs Lab 06/07/13 1010  TROPONINI <0.30    Lipid Panel: No results found for this basename: CHOL, TRIG, HDL, CHOLHDL, VLDL, LDLCALC,  in the last 168 hours  CBG:  Recent Labs Lab 06/07/13 1731  GLUCAP 76    Microbiology: Results for orders placed during the hospital encounter of 06/07/13  GRAM STAIN     Status: None   Collection Time    06/07/13  3:55 PM      Result Value Ref Range Status   Specimen Description CSF   Final   Special Requests 6.0ML FLUID  Final   Gram Stain     Final   Value: CYTOSPIN PREP     WBC PRESENT, PREDOMINANTLY PMN     NO ORGANISMS SEEN   Report Status 06/07/2013 FINAL   Final  CSF CULTURE     Status: None   Collection Time    06/07/13  3:55 PM      Result Value Ref Range Status   Specimen Description CSF   Final   Special Requests 6.0ML CSF FLUID   Final   Gram Stain     Final   Value: WBC PRESENT,BOTH PMN AND MONONUCLEAR     NO ORGANISMS SEEN     Performed at [facility] CYTOSPIN     Performed at [facility]   Culture     Final   Value: NO GROWTH 3 DAYS     Performed at [facility]   Report Status 06/10/2013 FINAL   Final  FUNGUS CULTURE W SMEAR     Status: None   Collection Time    06/09/13  1:33 PM      Result Value Ref Range Status   Specimen Description  CSF   Final   Special Requests 9.0ML   Final   Fungal Smear     Final   Value: NO YEAST OR FUNGAL ELEMENTS SEEN     Performed at [facility]   Culture     Final   Value: CULTURE IN PROGRESS FOR FOUR WEEKS     Performed at [facility]   Report Status PENDING   Incomplete  VIRAL CULTURE [place]     Status: None   Collection Time    06/09/13  1:33 PM      Result Value Ref Range Status   Specimen Description CSF FLUID   Final   Special Requests Immunocompromised   Final   Culture     Final   Value: Culture has been initiated.     Note:    The current Enterovirus culture system does not detect Enterovirus D68. If Enterovirus D68 is suspected, please call client services to add the test for Enterovirus PCR (Test code [phone]).     Performed at [facility]   Report Status PENDING   Incomplete  AFB CULTURE WITH SMEAR     Status: None   Collection Time    06/09/13  1:33 PM      Result Value Ref Range Status   Specimen Description CSF   Final   Special Requests 9.0ML FLUID   Final   ACID FAST SMEAR     Final   Value: NO ACID FAST BACILLI SEEN     Performed at [facility]   Culture     Final   Value: CULTURE WILL BE EXAMINED FOR 6 WEEKS BEFORE ISSUING A FINAL REPORT     Performed at [facility]   Report Status PENDING   Incomplete  CLOSTRIDIUM DIFFICILE BY PCR     Status: None   Collection Time    06/11/13  6:05 PM      Result Value Ref Range Status   C difficile by pcr NEGATIVE  NEGATIVE Final    Coagulation Studies: No results found for this basename: LABPROT, INR,  in the last 72 hours  Imaging: No results found.  Medications:  Scheduled: . aspirin  81 mg Oral Daily  . atenolol  25 mg Oral Daily  . feeding supplement (ENSURE COMPLETE)  237 mL  Oral TID  . heparin  5,000 Units Subcutaneous 3 times per day  . lisinopril  5 mg Oral Daily  . pencillin G potassium IV  4 Million Units Intravenous Q4H  . sodium chloride  3 mL Intravenous  Q12H    Assessment/Plan: Patient with improvement in his upper extremity tremor. Would continue to hold Sinemet and have follow up with Dr. Carles Collet as out patient. No further recommendations from neurological stand point. Will need to follow up with out patient neurology.  S/O.       Etta Quill PA-C [facility] [phone]  06/13/2013, 10:25 AM

## 2013-06-13 NOTE — Progress Notes (Signed)
Utilization review completed.  

## 2013-06-13 NOTE — Progress Notes (Signed)
Family Medicine Teaching Service Daily Progress Note Intern Pager: [phone]  Patient name: Shaun Lutz Medical record number: [id number] Date of birth: 1956/01/13 Age: 58 y.o. Gender: male  Primary Care Provider: Donnamae Jude, MD Consultants: ID, neuro Code Status: full  Pt Overview and Major Events to Date: Shaun Lutz is a 58 y.o. 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 Lucianne Lei Dam's note 4/11: soiled himself overnight, awoke confused, so Zoloft discontinued; otherwise little change 4/13: severe panic attacks, perseverative, ativan 1mg  4/14: HIV med assistance and HIV genomic studies  Assessment and Plan: Shaun Lutz is a 58 y.o. 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  - await diagnostics from repeat LP and pending labs for HIV prior to starting any antiretroviral meds -f/up repeat LP results   - CSF culture / studies (see results review)  - TB by PCR  - HSV by PCR (NEGATIVE)  - fungal with smear (negative smear, Cx pending)  - AFB with smear (negative smear, Cx  pending)  - viral culture (prelim neg X3 days)  - cytology (no malig cells) -ID following greatly appreciate recs and management -covering with high-dose Pen-G, defer to ID for duration of regimen, expect 10-14days (started 4/9--) -consult SW for ARV/drug coverage -HIV genomic studies in process: HIV RNA quant approx 60,000; CD$ 250  #Neuro - little change since admission, other than slight improvement in UE resting/intention tremor on finger-nose; Gait abnormality, hearing loss, CNS infection, cogwheel rigidity  DDx includes sequelae of HIV/syphilis vs parkinsonian features vs demyelinating d/o.Likely patient having features related to progression of infectious disease (see above).  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  - 2D echo 4/10 grossly normal, Currently normotensive, intermittently bradycardic (with a pacer).  - cont lisinopril  - hold atenolol, pacer interrogated; consider formal cards consult if needed - cont ASA   #Pulm- COPD? Bullous changes seen on CT chest with bilat infiltrates -no prescriptions for COPD currently  -vitals per floor protocol, will cont to monitor with pulse ox   #Psych/Social Suicidal ideation- in light of recent diagnosis, denies SI currently and states hasnt had any SI > 1 week; girlfriend feels he is severely depressed; also prone to anxiety attacks, hyperemotional - no 1:1 at this time, but will continually reassess  - D/C'd Zoloft and monitor clinically for now (had loose stool and ?increased confusion overnight 4/10 after starting)  #FEN/GI: heart healthy diet, saline lock IV; replete electrolytes prn -hold PO if patient anxious  Disposition: management as above with extensive further work-up pending (  unknown infectious etiology vs malignancy vs neurodegenerative  d/o); discharge planning pending numerous results and appropriate therapeutic decisions to be made, SW consult for medication management  Subjective: Eating breakfast this morning; girlfriend at bedside feeding; less anxious and perseverative   Objective: Temp:  [97.7 F (36.5 C)-98.1 F (36.7 C)] 97.7 F (36.5 C) (04/14 0500) Pulse Rate:  [72-81] 81 (04/14 0500) Resp:  [18] 18 (04/14 0500) BP: (117-150)/(81-88) 117/81 mmHg (04/14 0500) SpO2:  [95 %-100 %] 95 % (04/14 0500) Physical Exam: Gen: adult male appears older than stated age, in NAD;more cooperative with exam this morning  HEENT: NCAT, EOMI, PERRL, poor dentition; no palatal lesions, white discoloration of tongue remains Neck: normal ROM, supple, no rigidity CV: RRR without definite murmur, cap refill <3  Resp: CTABL, no wheezes, non-labored  Abd: thin, scaphoid, SNTND, BS present, no guarding or organomegaly  Ext: thin extremities, warm, normal tone, Neuro: orientation not specifically asked this AM; otherwise little change in neuro status; Mild / subtle resting tremor in UE at baseline worsened with anxiety and movement  Skin: vitiligo, multiple hyperpigmented macules throughout entire body including palms and soles; scattered excoriations without evidence for skin superinfection  Laboratory:  Recent Labs Lab 06/07/13 1010 06/07/13 2025  WBC 4.9 4.3  HGB 14.0 12.6*  HCT 39.9 36.4*  PLT 195 193    Recent Labs Lab 06/07/13 1010 06/07/13 2025  NA 136*  --   K 4.0  --   CL 97  --   CO2 30  --   BUN 12  --   CREATININE 0.85 0.68  CALCIUM 9.0  --   PROT 8.3  --   BILITOT 0.5  --   ALKPHOS 77  --   ALT 20  --   AST 18  --   GLUCOSE 140*  --    utox neg  U/a neg apart from 2.0 protein  CSF gram stain no organism  Crypto ag neg  See results review and A/P for extensive CSF study results  Imaging/Diagnostic Tests: CT head 06/07/13 IMPRESSION:  1. Hypoattenuation in the left temporal tip appears  parenchymal and  are likely related to ischemia of and an extra-axial arachnoid cyst.  2. Diffuse subcortical white matter hypoattenuation bilaterally is  stable. 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  3. Mild prominence the ventricles bilaterally is likely due to  atrophy. Mild hydrocephalus is considered less likely. Lumbar  puncture may be useful for further evaluation.   CT cervical spine 06/08/13 IMPRESSION:  1. No significant para meningeal focus of infection is identified.  2. Multilevel degenerative cervical spine disease as described.  3. Stable vertebral body heights and alignment.  4. Hypodense lesion within the right parotid gland measures 10 x 15  mm. This could represent a hyperdense lymph node associated with  HIV. However, no other significant adenopathy is present. A primary  parotid lesion is also considered.  5. Inguinal lymph nodes bilaterally are likely related to HIV.  6. Bilateral pleural effusions. Infection is not excluded.  7. Mass like area dependently in the left lower lobe likely  represents rounded atelectasis or potentially some fluid trapped  within the fissure.  8. 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   Langston Masker, MD 06/13/2013, 7:30 AM PGY-1, [place] Intern pager: [phone], text pages welcome

## 2013-06-13 NOTE — Progress Notes (Signed)
[facility] for Infectious Disease    Day # 7 penicillin   Subjective:  No complaints  Antibiotics:  Anti-infectives   Start     Dose/Rate Route Frequency Ordered Stop   06/08/13 0200  penicillin G potassium 4 Million Units in dextrose 5 % 250 mL IVPB     4 Million Units 250 mL/hr over 60 Minutes Intravenous Every 4 hours 06/08/13 0033     06/07/13 2200  cefTRIAXone (ROCEPHIN) 2 g in dextrose 5 % 50 mL IVPB  Status:  Discontinued     2 g 100 mL/hr over 30 Minutes Intravenous Every 12 hours 06/07/13 1847 06/07/13 1852   06/07/13 2200  cefTRIAXone (ROCEPHIN) 2 g in dextrose 5 % 50 mL IVPB  Status:  Discontinued    Comments:  Please draw blood cultures before antibiotics   2 g 100 mL/hr over 30 Minutes Intravenous Every 12 hours 06/07/13 1852 06/08/13 1459   06/07/13 2200  acyclovir (ZOVIRAX) 525 mg in dextrose 5 % 100 mL IVPB  Status:  Discontinued     10 mg/kg  52.5 kg 110.5 mL/hr over 60 Minutes Intravenous 3 times per day 06/07/13 1915 06/09/13 1041   06/07/13 2000  vancomycin (VANCOCIN) IVPB 750 mg/150 ml premix  Status:  Discontinued     750 mg 150 mL/hr over 60 Minutes Intravenous Every 12 hours 06/07/13 1915 06/08/13 1459   06/07/13 2000  Ampicillin-Sulbactam (UNASYN) 3 g in sodium chloride 0.9 % 100 mL IVPB  Status:  Discontinued     3 g 100 mL/hr over 60 Minutes Intravenous Every 6 hours 06/07/13 1915 06/08/13 0033      Medications: Scheduled Meds: . aspirin  81 mg Oral Daily  . atenolol  25 mg Oral Daily  . feeding supplement (ENSURE COMPLETE)  237 mL Oral TID  . heparin  5,000 Units Subcutaneous 3 times per day  . lisinopril  5 mg Oral Daily  . pencillin G potassium IV  4 Million Units Intravenous Q4H  . sodium chloride  3 mL Intravenous Q12H   Continuous Infusions:  PRN Meds:.    Objective: Weight change:   Intake/Output Summary (Last 24 hours) at 06/13/13 2029 Last data filed at 06/13/13 1638  Gross per 24 hour  Intake    480 ml    Output   1150 ml  Net   -670 ml   Blood pressure 94/60, pulse 68, temperature 98 F (36.7 C), temperature source Oral, resp. rate 18, height 5' 8.11" (1.73 m), weight 113 lb 8 oz (51.483 kg), SpO2 98.00%. Temp:  [97.7 F (36.5 C)-98.2 F (36.8 C)] 98 F (36.7 C) (04/14 1800) Pulse Rate:  [68-81] 68 (04/14 1800) Resp:  [18] 18 (04/14 1800) BP: (94-125)/(60-81) 94/60 mmHg (04/14 1800) SpO2:  [95 %-98 %] 98 % (04/14 1800)  Physical Exam: AO x 3 not nearly as HOH as yesterday HEENT: anicteric sclera, pupils reactive to light and accommodation, EOMI,  CVS regular rate, normal r, no murmur rubs or gallops  Chest: clear to auscultation bilaterally, no wheezing, rales or rhonchi  Abdomen: soft nontender, nondistended, normal bowel sounds,  Extremities: no clubbing or edema noted bilaterally  Skin: hyperpigmented lesions seen below and pictures  Neuro:  nonfocal  CBC:  Recent Labs Lab 06/07/13 1010 06/07/13 2025  HGB 14.0 12.6*  HCT 39.9 36.4*  PLT 195 193  INR 1.00  --      BMET No results found for this basename: NA, K, CL, CO2,  GLUCOSE, BUN, CREATININE, CALCIUM,  in the last 72 hours   Liver Panel  No results found for this basename: PROT, ALBUMIN, AST, ALT, ALKPHOS, BILITOT, BILIDIR, IBILI,  in the last 72 hours     Sedimentation Rate No results found for this basename: ESRSEDRATE,  in the last 72 hours C-Reactive Protein No results found for this basename: CRP,  in the last 72 hours  Micro Results: Recent Results (from the past 240 hour(s))  GRAM STAIN     Status: None   Collection Time    06/07/13  3:55 PM      Result Value Ref Range Status   Specimen Description CSF   Final   Special Requests 6.0ML FLUID   Final   Gram Stain     Final   Value: CYTOSPIN PREP     WBC PRESENT, PREDOMINANTLY PMN     NO ORGANISMS SEEN   Report Status 06/07/2013 FINAL   Final  CSF CULTURE     Status: None   Collection Time    06/07/13  3:55 PM      Result Value Ref Range  Status   Specimen Description CSF   Final   Special Requests 6.0ML CSF FLUID   Final   Gram Stain     Final   Value: WBC PRESENT,BOTH PMN AND MONONUCLEAR     NO ORGANISMS SEEN     Performed at [facility] CYTOSPIN     Performed at [facility]   Culture     Final   Value: NO GROWTH 3 DAYS     Performed at [facility]   Report Status 06/10/2013 FINAL   Final  FUNGUS CULTURE W SMEAR     Status: None   Collection Time    06/09/13  1:33 PM      Result Value Ref Range Status   Specimen Description CSF   Final   Special Requests 9.0ML   Final   Fungal Smear     Final   Value: NO YEAST OR FUNGAL ELEMENTS SEEN     Performed at [facility]   Culture     Final   Value: CULTURE IN PROGRESS FOR FOUR WEEKS     Performed at [facility]   Report Status PENDING   Incomplete  VIRAL CULTURE [place]     Status: None   Collection Time    06/09/13  1:33 PM      Result Value Ref Range Status   Specimen Description CSF FLUID   Final   Special Requests Immunocompromised   Final   Culture     Final   Value: Culture has been initiated.     Note:    The current Enterovirus culture system does not detect Enterovirus D68. If Enterovirus D68 is suspected, please call client services to add the test for Enterovirus PCR (Test code [phone]).     Performed at [facility]   Report Status PENDING   Incomplete  AFB CULTURE WITH SMEAR     Status: None   Collection Time    06/09/13  1:33 PM      Result Value Ref Range Status   Specimen Description CSF   Final   Special Requests 9.0ML FLUID   Final   ACID FAST SMEAR     Final   Value: NO ACID FAST BACILLI SEEN     Performed at [facility]   Culture     Final  Value: CULTURE WILL BE EXAMINED FOR 6 WEEKS BEFORE ISSUING A FINAL REPORT     Performed at [facility]   Report Status PENDING   Incomplete  CLOSTRIDIUM DIFFICILE BY PCR     Status: None   Collection Time    06/11/13  6:05 PM       Result Value Ref Range Status   C difficile by pcr NEGATIVE  NEGATIVE Final    Studies/Results: No results found.    Assessment/Plan:  Active Problems:   ATRIOVENTRICULAR BLOCK, MOBITZ TYPE II   Pacemaker- medtronic   Abnormality of gait   Human immunodeficiency virus (HIV) disease   Lower extremity weakness   Syphilis   Malnutrition of moderate degree    Shaun Lutz is a 58 y.o. 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 but he could have 2-3 other CNS pathologies going on as well  With re to VZV--those antibodies were on SERUM and not on CSF, therefore this is NOT evidence for VZV vasculopathy  INformed by Flow that they do not have enough cells for flow cytology, only cytology which was negative  AFB and fungal are not seen on stains  MTB PCR not back yet  --continue high dose PCN, he needs 2 weeks IV   #2 HIV/AIDS: he will need HIV GENO and HIV INI GENO  ---we should NOT start ARV until we understand what kind of CNS pathology we are dealing with as there would be a high risk of immune reconstitution inflammatory syndrome with for example tuberculosis meningitis.   --would be prudent to see what kind of drug coverage he has with medicare because if it is a lousy one he will need the SPAP (AIDS drug assistance federal program) would consult social work to see if would cover antiviral meds such as STRIBILD, Tivicay and Truvada  Dr. Baxter Flattery will be covering tomorrow and Thursday.     LOS: 6 days   Truman Hayward 06/13/2013, 8:29 PM

## 2013-06-14 DIAGNOSIS — B20 Human immunodeficiency virus [HIV] disease: Secondary | ICD-10-CM | POA: Diagnosis not present

## 2013-06-14 DIAGNOSIS — R45851 Suicidal ideations: Secondary | ICD-10-CM | POA: Diagnosis not present

## 2013-06-14 DIAGNOSIS — A523 Neurosyphilis, unspecified: Secondary | ICD-10-CM | POA: Diagnosis not present

## 2013-06-14 DIAGNOSIS — A879 Viral meningitis, unspecified: Secondary | ICD-10-CM | POA: Diagnosis not present

## 2013-06-14 DIAGNOSIS — R269 Unspecified abnormalities of gait and mobility: Secondary | ICD-10-CM | POA: Diagnosis not present

## 2013-06-14 NOTE — Progress Notes (Signed)
[facility] for Infectious Disease    Day # 8 penicillin   Subjective:  No complaints but does report several times " i want to kill myself" if I have HIV  Antibiotics:  Anti-infectives   Start     Dose/Rate Route Frequency Ordered Stop   06/08/13 0200  penicillin G potassium 4 Million Units in dextrose 5 % 250 mL IVPB     4 Million Units 250 mL/hr over 60 Minutes Intravenous Every 4 hours 06/08/13 0033     06/07/13 2200  cefTRIAXone (ROCEPHIN) 2 g in dextrose 5 % 50 mL IVPB  Status:  Discontinued     2 g 100 mL/hr over 30 Minutes Intravenous Every 12 hours 06/07/13 1847 06/07/13 1852   06/07/13 2200  cefTRIAXone (ROCEPHIN) 2 g in dextrose 5 % 50 mL IVPB  Status:  Discontinued    Comments:  Please draw blood cultures before antibiotics   2 g 100 mL/hr over 30 Minutes Intravenous Every 12 hours 06/07/13 1852 06/08/13 1459   06/07/13 2200  acyclovir (ZOVIRAX) 525 mg in dextrose 5 % 100 mL IVPB  Status:  Discontinued     10 mg/kg  52.5 kg 110.5 mL/hr over 60 Minutes Intravenous 3 times per day 06/07/13 1915 06/09/13 1041   06/07/13 2000  vancomycin (VANCOCIN) IVPB 750 mg/150 ml premix  Status:  Discontinued     750 mg 150 mL/hr over 60 Minutes Intravenous Every 12 hours 06/07/13 1915 06/08/13 1459   06/07/13 2000  Ampicillin-Sulbactam (UNASYN) 3 g in sodium chloride 0.9 % 100 mL IVPB  Status:  Discontinued     3 g 100 mL/hr over 60 Minutes Intravenous Every 6 hours 06/07/13 1915 06/08/13 0033      Medications: Scheduled Meds: . aspirin  81 mg Oral Daily  . atenolol  25 mg Oral Daily  . feeding supplement (ENSURE COMPLETE)  237 mL Oral TID  . heparin  5,000 Units Subcutaneous 3 times per day  . lisinopril  5 mg Oral Daily  . pencillin G potassium IV  4 Million Units Intravenous Q4H  . sodium chloride  3 mL Intravenous Q12H    Objective: Weight change:   Intake/Output Summary (Last 24 hours) at 06/14/13 1038 Last data filed at 06/14/13 1036  Gross per 24  hour  Intake   1230 ml  Output    950 ml  Net    280 ml   Blood pressure 152/88, pulse 78, temperature 97.6 F (36.4 C), temperature source Oral, resp. rate 16, height 5' 8.11" (1.73 m), weight 119 lb 6.4 oz (54.159 kg), SpO2 98.00%. Temp:  [97.6 F (36.4 C)-98.5 F (36.9 C)] 97.6 F (36.4 C) (04/15 0917) Pulse Rate:  [68-84] 78 (04/15 0917) Resp:  [16-18] 16 (04/15 0917) BP: (92-152)/(56-88) 152/88 mmHg (04/15 0917) SpO2:  [96 %-98 %] 98 % (04/15 0917) Weight:  [119 lb 6.4 oz (54.159 kg)] 119 lb 6.4 oz (54.159 kg) (04/14 2248)  Physical Exam: gen A x O to person and place. Date completely off, 2012, bush is the president HEENT: anicteric sclera, pupils reactive to light and accommodation, EOMI,  CVS regular rate, normal r, no murmur rubs or gallops  Chest: clear to auscultation bilaterally, no wheezing, rales or rhonchi  Abdomen: soft nontender, nondistended, normal bowel sounds,  Extremities: no clubbing or edema noted bilaterally  Skin: vitiligo to hands predominantly Neuro:  nonfocal  CBC:  Recent Labs Lab 06/07/13 2025  HGB 12.6*  HCT 36.4*  PLT 193     BMET No results found for this basename: NA, K, CL, CO2, GLUCOSE, BUN, CREATININE, CALCIUM,  in the last 72 hours   Micro Results: Recent Results (from the past 240 hour(s))  GRAM STAIN     Status: None   Collection Time    06/07/13  3:55 PM      Result Value Ref Range Status   Specimen Description CSF   Final   Special Requests 6.0ML FLUID   Final   Gram Stain     Final   Value: CYTOSPIN PREP     WBC PRESENT, PREDOMINANTLY PMN     NO ORGANISMS SEEN   Report Status 06/07/2013 FINAL   Final  CSF CULTURE     Status: None   Collection Time    06/07/13  3:55 PM      Result Value Ref Range Status   Specimen Description CSF   Final   Special Requests 6.0ML CSF FLUID   Final   Gram Stain     Final   Value: WBC PRESENT,BOTH PMN AND MONONUCLEAR     NO ORGANISMS SEEN     Performed at [facility]  CYTOSPIN     Performed at [facility]   Culture     Final   Value: NO GROWTH 3 DAYS     Performed at [facility]   Report Status 06/10/2013 FINAL   Final  FUNGUS CULTURE W SMEAR     Status: None   Collection Time    06/09/13  1:33 PM      Result Value Ref Range Status   Specimen Description CSF   Final   Special Requests 9.0ML   Final   Fungal Smear     Final   Value: NO YEAST OR FUNGAL ELEMENTS SEEN     Performed at [facility]   Culture     Final   Value: CULTURE IN PROGRESS FOR FOUR WEEKS     Performed at [facility]   Report Status PENDING   Incomplete  VIRAL CULTURE [place]     Status: None   Collection Time    06/09/13  1:33 PM      Result Value Ref Range Status   Specimen Description CSF FLUID   Final   Special Requests Immunocompromised   Final   Culture     Final   Value: Culture has been initiated.     Note:    The current Enterovirus culture system does not detect Enterovirus D68. If Enterovirus D68 is suspected, please call client services to add the test for Enterovirus PCR (Test code [phone]).     Performed at [facility]   Report Status PENDING   Incomplete  AFB CULTURE WITH SMEAR     Status: None   Collection Time    06/09/13  1:33 PM      Result Value Ref Range Status   Specimen Description CSF   Final   Special Requests 9.0ML FLUID   Final   ACID FAST SMEAR     Final   Value: NO ACID FAST BACILLI SEEN     Performed at [facility]   Culture     Final   Value: CULTURE WILL BE EXAMINED FOR 6 WEEKS BEFORE ISSUING A FINAL REPORT     Performed at [facility]   Report Status PENDING   Incomplete  CLOSTRIDIUM DIFFICILE BY PCR     Status:  None   Collection Time    06/11/13  6:05 PM      Result Value Ref Range Status   C difficile by pcr NEGATIVE  NEGATIVE Final     Assessment/Plan:  Active Problems:   ATRIOVENTRICULAR BLOCK, MOBITZ TYPE II   Pacemaker- medtronic   Abnormality of gait   Human  immunodeficiency virus (HIV) disease   Lower extremity weakness   Syphilis   Malnutrition of moderate degree    Shaun Lutz is a 58 y.o. 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 Will need 14 days of high dose penicillin, then 3 doses of weekly PCN dose  unfortunatley did not have enough cells for flow cytology, only cytology which was negative  AFB and fungal are not seen on stains  MTB PCR not back yet   #2 HIV/AIDS: HIV GENO is pending. Will not start ARV until we can rule out mtB meningitis  #3 adjustment to new diagnosis/suicidie ideation = please get sitter in addition to psychiatric evaluation. I am not sure what his baseline function is but he suffers from some cognitive impairment.   -   LOS: 7 days   [name] 06/14/2013, 10:38 AM

## 2013-06-14 NOTE — Progress Notes (Signed)
Rehab Admissions Coordinator Note:  Patient was screened by [name] for appropriateness for an Inpatient Acute Rehab Consult.  At this time, we are recommending Inpatient Rehab consult.  Shaun Lutz Temeca Somma 06/14/2013, 2:49 PM  I can be reached at [phone].

## 2013-06-14 NOTE — Evaluation (Addendum)
Physical Therapy Evaluation Patient Details Name: Shaun Lutz MRN: [id number] DOB: 07/31/1955 Today's Date: 06/14/2013   History of Present Illness  Shaun Lutz is a 58 y.o. 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  Clinical Impression  Patient presents with decreased independence with mobility due to deficits listed below in PT problem list.  He will benefit from skilled PT in the acute setting to allow return home with assist of girlfriend/aide after CIR rehab stay.    Follow Up Recommendations Supervision/Assistance - 24 hour;CIR    Equipment Recommendations  Rolling walker with 5" wheels    Recommendations for Other Services       Precautions / Restrictions Precautions Precautions: Fall Precaution Comments: intermittent body shaking tremors Restrictions Weight Bearing Restrictions: No      Mobility  Bed Mobility Overal bed mobility: Needs Assistance Bed Mobility: Supine to Sit     Supine to sit: HOB elevated;Mod assist     General bed mobility comments: guided feet off bed and assist to lift trunk  Transfers Overall transfer level: Needs assistance Equipment used: 2 person hand held assist Transfers: Sit to/from Stand;Stand Pivot Transfers Sit to Stand: +2 physical assistance;Mod assist Stand pivot transfers: Mod assist;+2 physical assistance       General transfer comment: lifting assist from bed; pivot back to bed from wheelchair, +2 due to ataxia, tremors  Ambulation/Gait Ambulation/Gait assistance: Mod assist;+2 physical assistance Ambulation Distance (Feet): 80 Feet Assistive device: 2 person hand held assist;1 person hand held assist Gait Pattern/deviations: Step-through pattern;Ataxic;Shuffle;Decreased stride length     General Gait  Details: was walking near nurses station and talking, noting HR up to 122; patient became limp and tried to lower himself to floor; tech and PT held pt up till chair obtained for pt.  Stairs            Wheelchair Mobility    Modified Rankin (Stroke Patients Only)       Balance Overall balance assessment: History of Falls;Needs assistance Sitting-balance support: No upper extremity supported;Feet supported Sitting balance-Leahy Scale: Fair     Standing balance support: Bilateral upper extremity supported;During functional activity Standing balance-Leahy Scale: Poor Standing balance comment: UE assist needed for balance                             Pertinent Vitals/Pain No pain complaints; HR 122 with ambualtion    Home Living Family/patient expects to be discharged to:: Private residence Living Arrangements: Non-relatives/Friends Available Help at Discharge: Other (Comment) (girlfriend) Type of Home: House Home Access: Stairs to enter Entrance Stairs-Rails: Right Entrance Stairs-Number of Steps: 4 Home Layout: One level Home Equipment: None      Prior Function Level of Independence: Independent         Comments: states girlfriend is disabled and has an aide that does cleaning, cooking, etc     Hand Dominance   Dominant Hand: Right    Extremity/Trunk Assessment   Upper Extremity Assessment: Generalized weakness RUE Deficits / Details: 3+/5 grossly     LUE Deficits / Details: 3+/5 grossly   Lower Extremity Assessment: Defer to PT evaluation RLE Deficits / Details: AROM WFL, strength hip flexion 3/5, knee extension 4+/5, ankle DF 4-/5 LLE Deficits / Details:  AROM WFL, strength hip flexion 3/5, knee extension 4+/5, ankle DF 4-/5     Communication   Communication: HOH  Cognition Arousal/Alertness: Awake/alert Behavior During Therapy: Restless Overall Cognitive Status: Impaired/Different from baseline Area of Impairment:  Orientation Orientation Level: Time                  General Comments      Exercises        Assessment/Plan    PT Assessment Patient needs continued PT services  PT Diagnosis Difficulty walking;Abnormality of gait;Generalized weakness   PT Problem List Decreased strength;Decreased mobility;Decreased safety awareness;Decreased activity tolerance;Decreased balance;Decreased knowledge of precautions;Decreased knowledge of use of DME  PT Treatment Interventions DME instruction;Gait training;Stair training;Functional mobility training;Therapeutic activities;Patient/family education;Balance training;Therapeutic exercise   PT Goals (Current goals can be found in the Care Plan section) Acute Rehab PT Goals Patient Stated Goal: To get stronger PT Goal Formulation: With patient Time For Goal Achievement: 06/28/13 Potential to Achieve Goals: Good    Frequency Min 3X/week   Barriers to discharge        Co-evaluation               End of Session Equipment Utilized During Treatment: Gait belt Activity Tolerance: Other (comment) (limited by tremors/ weakness) Patient left: in bed;with call bell/phone within reach;with nursing/sitter in room;with bed alarm set           Time: 5643-3295 PT Time Calculation (min): 16 min   Charges:   PT Evaluation $Initial PT Evaluation Tier I: 1 Procedure PT Treatments $Gait Training: 8-22 mins   PT G CodesMax Sane 06/14/2013, 2:17 PM Magda Kiel, [place] 06/14/2013

## 2013-06-14 NOTE — Progress Notes (Signed)
Patient expressed suicidal ideation while speaking with Infectious Disease team.  Nurse assessed patient and patient confirmed suicidal ideation stating that he saw his family die and he "was just thinking about suicide."  Room assessed and suicidal precautions initiated.  Sitter at bedside.  Teaching service notified via telephone.

## 2013-06-14 NOTE — Progress Notes (Signed)
Called by Grayland Ormond to reassess patient after expressing active suicidal ideation during discussions with Dr. Baxter Flattery. Upon entering room 1:1 sitter already at bedside. Patient again discussing deaths of both mother and father. Thought that it was time for him to join them. No active plan to end life with weapon pills etc. [facility] for psychiatric evaluation.  Bernadene Bell, MD Family Medicine PGY-1 Please page or call with questions

## 2013-06-14 NOTE — Progress Notes (Signed)
FMTS Attending Daily Note:  Annabell Sabal MD  [phone] pager  Family Practice pager:  [phone] I have seen and examined this patient and have reviewed their chart. I have discussed this patient with the resident. I agree with the resident's findings, assessment and care plan.  Additionally:  Rounded on patient and examined with entire team.  Currently at baseline/stable.  Awaiting return of all viral cultures/CSF studies.  Touching base with social work to see about SNF placement.  Will need long-term PCN.   [name], MD 06/14/2013 3:11 PM

## 2013-06-14 NOTE — Evaluation (Signed)
Occupational Therapy Evaluation Patient Details Name: Shaun Lutz MRN: [id number] DOB: Mar 23, 1955 Today's Date: 06/14/2013    History of Present Illness Shaun Lutz is a 58 y.o. 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   Clinical Impression   Pt demonstrates decline in function and safety with ADLs and ADL mobility with decreased strength, balance, endurance ad cognition. Pt would benefit from acute OT services to address impairments to increase level of function and safety    Follow Up Recommendations  CIR;Supervision/Assistance - 24 hour    Equipment Recommendations       Recommendations for Other Services       Precautions / Restrictions Precautions Precautions: Fall Precaution Comments: intermittent body shaking tremors Restrictions Weight Bearing Restrictions: No      Mobility Bed Mobility Overal bed mobility: Needs Assistance Bed Mobility: Supine to Sit     Supine to sit: HOB elevated;Mod assist     General bed mobility comments: guided feet off bed and assist to lift trunk  Transfers Overall transfer level: Needs assistance Equipment used: 2 person hand held assist Transfers: Sit to/from Stand;Stand Pivot Transfers Sit to Stand: +2 physical assistance;Mod assist Stand pivot transfers: Mod assist;+2 physical assistance       General transfer comment: lifting assist from bed; pivot back to bed from wheelchair, +2 due to ataxia, tremors    Balance Overall balance assessment: History of Falls;Needs assistance Sitting-balance support: No upper extremity supported;Feet supported Sitting balance-Leahy Scale: Fair     Standing balance support: Bilateral upper extremity supported;During functional activity Standing balance-Leahy Scale:  Poor Standing balance comment: UE assist needed for balance                            ADL Overall ADL's : Needs assistance/impaired     Grooming: Moderate assistance;Standing Grooming Details (indicate cue type and reason): +2 physical assist fot balance Upper Body Bathing: Minimal assitance;Sitting   Lower Body Bathing: Moderate assistance;Sitting/lateral leans;Sit to/from stand   Upper Body Dressing : Minimal assistance;Sitting   Lower Body Dressing: Maximal assistance;Sit to/from stand;Sitting/lateral leans   Toilet Transfer: Moderate assistance;Minimal assistance;BSC Toilet Transfer Details (indicate cue type and reason): +2 physical assist, cues for safety Toileting- Clothing Manipulation and Hygiene: Total assistance;Sit to/from stand         General ADL Comments: cues for safety, decreased standing balance     Vision  wears glasses per pt report                   Perception Perception Perception Tested?: No   Praxis Praxis Praxis tested?: Not tested    Pertinent Vitals/Pain No c/o pain. VSS     Hand Dominance Right   Extremity/Trunk Assessment Upper Extremity Assessment Upper Extremity Assessment: Generalized weakness RUE Deficits / Details: 3+/5 grossly LUE Deficits / Details: 3+/5 grossly   Lower Extremity Assessment Lower Extremity Assessment: Defer to PT evaluation RLE Deficits / Details: AROM WFL, strength hip flexion 3/5, knee extension 4+/5, ankle DF 4-/5 LLE Deficits / Details: AROM WFL, strength hip flexion 3/5, knee extension 4+/5, ankle DF 4-/5       Communication Communication Communication: HOH   Cognition Arousal/Alertness: Awake/alert Behavior During Therapy: Restless Overall Cognitive Status: Impaired/Different from baseline Area of  Impairment: Orientation Orientation Level: Time                 General Comments   Pt pleasant and cooperative, did not express any suicidal ideations during session                  Flora expects to be discharged to:: Private residence Living Arrangements: Non-relatives/Friends Available Help at Discharge: Other (Comment) (girlfriend) Type of Home: House Home Access: Stairs to enter Technical brewer of Steps: 4 Entrance Stairs-Rails: Right Home Layout: One level     Bathroom Shower/Tub: Teacher, early years/pre: Standard     Home Equipment: None          Prior Functioning/Environment Level of Independence: Independent        Comments: states girlfriend is disabled and has an aide that does cleaning, cooking, etc    OT Diagnosis: Generalized weakness   OT Problem List: Decreased strength;Decreased knowledge of use of DME or AE;Decreased activity tolerance;Decreased cognition;Impaired balance (sitting and/or standing);Decreased safety awareness;Decreased knowledge of precautions   OT Treatment/Interventions: Self-care/ADL training;Therapeutic exercise;Patient/family education;Neuromuscular education;Balance training;Therapeutic activities;DME and/or AE instruction    OT Goals(Current goals can be found in the care plan section) Acute Rehab OT Goals Patient Stated Goal: To get stronger OT Goal Formulation: With patient Time For Goal Achievement: 06/21/13 Potential to Achieve Goals: Good ADL Goals Pt Will Perform Grooming: with min guard assist;with min assist;standing Pt Will Perform Upper Body Bathing: with supervision;with set-up;sitting Pt Will Perform Lower Body Bathing: with min assist;sitting/lateral leans;sit to/from stand Pt Will Perform Upper Body Dressing: with supervision;with set-up;sitting Pt Will Perform Lower Body Dressing: with mod assist;with min assist;sitting/lateral leans;sit to/from stand Pt Will Transfer to Toilet: with min assist;with min guard assist;ambulating;regular height toilet;bedside commode;grab bars Pt Will Perform Toileting - Clothing Manipulation and hygiene: with  mod assist;sitting/lateral leans;sit to/from stand  OT Frequency: Min 2X/week   Barriers to D/C:  decreased caregiver support                        End of Session Equipment Utilized During Treatment: Gait belt;Other (comment) (BSC)  Activity Tolerance: Patient tolerated treatment well Patient left: in chair;with call bell/phone within reach;with nursing/sitter in room   Time: 7616-0737 OT Time Calculation (min): 23 min Charges:  OT General Charges $OT Visit: 1 Procedure OT Evaluation $Initial OT Evaluation Tier I: 1 Procedure OT Treatments $Therapeutic Activity: 8-22 mins G-Codes:    Mosetta Putt 07-03-13, 2:19 PM

## 2013-06-15 DIAGNOSIS — A523 Neurosyphilis, unspecified: Secondary | ICD-10-CM | POA: Diagnosis not present

## 2013-06-15 DIAGNOSIS — G3184 Mild cognitive impairment, so stated: Secondary | ICD-10-CM | POA: Diagnosis not present

## 2013-06-15 DIAGNOSIS — B20 Human immunodeficiency virus [HIV] disease: Secondary | ICD-10-CM | POA: Diagnosis not present

## 2013-06-15 DIAGNOSIS — R45851 Suicidal ideations: Secondary | ICD-10-CM | POA: Diagnosis not present

## 2013-06-15 DIAGNOSIS — R279 Unspecified lack of coordination: Secondary | ICD-10-CM

## 2013-06-15 DIAGNOSIS — R269 Unspecified abnormalities of gait and mobility: Secondary | ICD-10-CM | POA: Diagnosis not present

## 2013-06-15 LAB — CBC WITH DIFFERENTIAL/PLATELET
BASOS ABS: 0 10*3/uL (ref 0.0–0.1)
BASOS PCT: 0 % (ref 0–1)
EOS PCT: 14 % — AB (ref 0–5)
Eosinophils Absolute: 1.8 10*3/uL — ABNORMAL HIGH (ref 0.0–0.7)
HCT: 35.4 % — ABNORMAL LOW (ref 39.0–52.0)
Hemoglobin: 11.5 g/dL — ABNORMAL LOW (ref 13.0–17.0)
LYMPHS PCT: 8 % — AB (ref 12–46)
Lymphs Abs: 1 10*3/uL (ref 0.7–4.0)
MCH: 30.7 pg (ref 26.0–34.0)
MCHC: 32.5 g/dL (ref 30.0–36.0)
MCV: 94.4 fL (ref 78.0–100.0)
Monocytes Absolute: 1 10*3/uL (ref 0.1–1.0)
Monocytes Relative: 8 % (ref 3–12)
NEUTROS ABS: 8.7 10*3/uL — AB (ref 1.7–7.7)
Neutrophils Relative %: 70 % (ref 43–77)
PLATELETS: 255 10*3/uL (ref 150–400)
RBC: 3.75 MIL/uL — ABNORMAL LOW (ref 4.22–5.81)
RDW: 12.6 % (ref 11.5–15.5)
WBC: 12.5 10*3/uL — AB (ref 4.0–10.5)

## 2013-06-15 LAB — COMPREHENSIVE METABOLIC PANEL
ALT: 70 U/L — AB (ref 0–53)
AST: 40 U/L — AB (ref 0–37)
Albumin: 2.6 g/dL — ABNORMAL LOW (ref 3.5–5.2)
Alkaline Phosphatase: 106 U/L (ref 39–117)
BUN: 10 mg/dL (ref 6–23)
CALCIUM: 8.8 mg/dL (ref 8.4–10.5)
CO2: 29 meq/L (ref 19–32)
Chloride: 97 mEq/L (ref 96–112)
Creatinine, Ser: 0.78 mg/dL (ref 0.50–1.35)
GFR calc Af Amer: >90 mL/min (ref >90)
Glucose, Bld: 96 mg/dL (ref 70–99)
Potassium: 4.6 mEq/L (ref 3.7–5.3)
SODIUM: 138 meq/L (ref 137–147)
Total Bilirubin: 0.2 mg/dL — ABNORMAL LOW (ref 0.3–1.2)
Total Protein: 7.3 g/dL (ref 6.0–8.3)

## 2013-06-15 LAB — GLUCOSE, CAPILLARY: Glucose-Capillary: 138 mg/dL — ABNORMAL HIGH (ref 70–99)

## 2013-06-15 MED ORDER — ESCITALOPRAM OXALATE 5 MG PO TABS
5.0000 mg | ORAL_TABLET | Freq: Every day | ORAL | Status: DC
  Administered 2013-06-15 – 2013-06-20 (×6): 5 mg via ORAL
  Filled 2013-06-15 (×7): qty 1

## 2013-06-15 NOTE — Progress Notes (Signed)
I will follow up tomorrow to assist with disposition planning for a possible inpt rehab admission when pt medically ready. [phone]

## 2013-06-15 NOTE — Progress Notes (Signed)
Family Medicine Teaching Service Daily Progress Note Intern Pager: [phone]  Patient name: Macklin Jacquin Medical record number: [id number] Date of birth: 1956-02-11 Age: 58 y.o. Gender: male  Primary Care Provider: Donnamae Jude, MD Consultants: ID, neuro Code Status: full  Pt Overview and Major Events to Date: Ethridge Sollenberger is a 58 y.o. 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 Lucianne Lei Dam's note 4/11: soiled himself overnight, awoke confused, so Zoloft discontinued; otherwise little change 4/13: severe panic attacks, perseverative, ativan 81m 4/14: HIV med assistance and HIV genomic studies 4/15: CM/SW and PT/OT to eval regarding placement 4/16: 1:1 for passive suicidal ideation, discussion re placement, consideration of CIR  Assessment and Plan: GAizen Duvalis a 58y.o. 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  - await diagnostics from repeat LP and pending labs for HIV prior to starting any antiretroviral meds; thus far negative, awaiting TB testing -f/up repeat LP results   - CSF  culture / studies (see results review)  - TB by PCR (in process)  - HSV by PCR (NEGATIVE)  - fungal with smear (negative smear, Cx pending)  - AFB with smear (negative smear, Cx pending)  - viral culture (prelim neg X4 days)  - cytology (no malig cells) -ID following greatly appreciate recs and management -covering with high-dose Pen-G IV for 2 weeks duration(started 4/9--) Day 9 today -HIV genomic studies in process:   - HIV RNA quant approx 60,000; CD4 250  - HIV integrade genotype/genotype plus in process   - HLA B*5701  #Neuro - Gait abnormality, hearing loss, CNS infection, cogwheel rigidity  DDx includes sequelae of HIV/syphilis vs parkinsonian features vs demyelinating d/o.Likely patient having features related to progression of infectious disease (see above).  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  2D echo 4/10 grossly normal, Borderline hypotensive this morning - cont lisinopril  - hold atenolol, pacer interrogated; consider formal cards consult if needed - cont ASA   #Pulm- COPD? Bullous changes seen on CT chest with bilat infiltrates -no prescriptions for COPD currently  -vitals per floor protocol, will cont to monitor with pulse ox   #Psych/Social Suicidal ideation- in light of recent diagnosis, denies SI currently; less labile this morning - 1:1 for now but will continually reassess  - hold Zoloft and monitor clinically for now  - awaiting formal psych eval/consulted yesterday  #FEN/GI: heart healthy diet, saline lock IV; replete electrolytes prn -hold PO if patient anxious  Disposition: management as above with  extensive further work-up pending (unknown infectious etiology vs malignancy vs neurodegenerative d/o); discharge planning, possible  to CIR, however will need to complete IV abx course  Subjective:  No emotional spells; recognizes that he is confused; states that he at one point had suicidal ideation but not currently, no thoughts/no plan  Objective: Temp:  [97.6 F (36.4 C)-98.4 F (36.9 C)] 98.1 F (36.7 C) (04/16 0536) Pulse Rate:  [72-122] 72 (04/16 0536) Resp:  [16-18] 16 (04/16 0536) BP: (95-152)/(59-88) 95/59 mmHg (04/16 0536) SpO2:  [95 %-98 %] 95 % (04/16 0536) Physical Exam: Gen: NAD; cooperative with exam this morning  HEENT: NCAT, EOMI, PERRL, poor dentition Neck: normal ROM, supple, no rigidity CV: RRR without definite murmur, cap refill <3  Resp: CTABL, no wheezes, non-labored  Abd: thin, scaphoid, SNTND, BS present, no guarding or organomegaly  Ext: thin extremities, warm, normal tone, Neuro: oriented to place and self only (may be oriented to month but possible he also looked on board); otherwise little change in neuro status; Mild / subtle resting tremor in UE at baseline worsened with anxiety and movement; still with dysmetria on FTN testing Skin: vitiligo, multiple hyperpigmented macules throughout entire body including palms and soles; scattered excoriations without evidence for skin superinfection  Laboratory: utox neg  U/a neg apart from 2.0 protein  CSF gram stain no organism  Crypto ag neg  See results review and A/P for extensive CSF study results  Imaging/Diagnostic Tests: CT head 06/07/13 IMPRESSION:  1. Hypoattenuation in the left temporal tip appears parenchymal and  are likely related to ischemia of and an extra-axial arachnoid cyst.  2. Diffuse subcortical white matter hypoattenuation bilaterally is  stable. 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  3. Mild prominence the ventricles bilaterally is likely due to  atrophy. Mild hydrocephalus is considered less likely. Lumbar  puncture may be useful for further evaluation.   CT cervical spine 06/08/13 IMPRESSION:  1. No  significant para meningeal focus of infection is identified.  2. Multilevel degenerative cervical spine disease as described.  3. Stable vertebral body heights and alignment.  4. Hypodense lesion within the right parotid gland measures 10 x 15  mm. This could represent a hyperdense lymph node associated with  HIV. However, no other significant adenopathy is present. A primary  parotid lesion is also considered.  5. Inguinal lymph nodes bilaterally are likely related to HIV.  6. Bilateral pleural effusions. Infection is not excluded.  7. Mass like area dependently in the left lower lobe likely  represents rounded atelectasis or potentially some fluid trapped  within the fissure.  8. 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   Langston Masker, MD 06/15/2013, 7:31 AM PGY-1, [facility] Intern pager: [phone], text pages welcome

## 2013-06-15 NOTE — Progress Notes (Signed)
[facility]    Day # 9 penicillin   Subjective:  Afebrile, he repeats that he doesn't know how he came to the hospital. Unclear why he now has a sitter in his room. His girlfriend of 22 yr is in the room (not married) she reports that the patient estranged from his family  Antibiotics:  Anti-infectives   Start     Dose/Rate Route Frequency Ordered Stop   06/08/13 0200  penicillin G potassium 4 Million Units in dextrose 5 % 250 mL IVPB     4 Million Units 250 mL/hr over 60 Minutes Intravenous Every 4 hours 06/08/13 0033     06/07/13 2200  cefTRIAXone (ROCEPHIN) 2 g in dextrose 5 % 50 mL IVPB  Status:  Discontinued     2 g 100 mL/hr over 30 Minutes Intravenous Every 12 hours 06/07/13 1847 06/07/13 1852   06/07/13 2200  cefTRIAXone (ROCEPHIN) 2 g in dextrose 5 % 50 mL IVPB  Status:  Discontinued    Comments:  Please draw blood cultures before antibiotics   2 g 100 mL/hr over 30 Minutes Intravenous Every 12 hours 06/07/13 1852 06/08/13 1459   06/07/13 2200  acyclovir (ZOVIRAX) 525 mg in dextrose 5 % 100 mL IVPB  Status:  Discontinued     10 mg/kg  52.5 kg 110.5 mL/hr over 60 Minutes Intravenous 3 times per day 06/07/13 1915 06/09/13 1041   06/07/13 2000  vancomycin (VANCOCIN) IVPB 750 mg/150 ml premix  Status:  Discontinued     750 mg 150 mL/hr over 60 Minutes Intravenous Every 12 hours 06/07/13 1915 06/08/13 1459   06/07/13 2000  Ampicillin-Sulbactam (UNASYN) 3 g in sodium chloride 0.9 % 100 mL IVPB  Status:  Discontinued     3 g 100 mL/hr over 60 Minutes Intravenous Every 6 hours 06/07/13 1915 06/08/13 0033      Medications: Scheduled Meds: . aspirin  81 mg Oral Daily  . atenolol  25 mg Oral Daily  . escitalopram  5 mg Oral QHS  . feeding supplement (ENSURE COMPLETE)  237 mL Oral TID  . heparin  5,000 Units Subcutaneous 3 times per day  . lisinopril  5 mg Oral Daily  . pencillin G potassium IV  4 Million Units Intravenous Q4H  . sodium  chloride  3 mL Intravenous Q12H    Objective: Weight change:   Intake/Output Summary (Last 24 hours) at 06/15/13 1357 Last data filed at 06/15/13 1300  Gross per 24 hour  Intake   2340 ml  Output   1450 ml  Net    890 ml   Blood pressure 101/64, pulse 72, temperature 98 F (36.7 C), temperature source Oral, resp. rate 16, height 5' 8.11" (1.73 m), weight 119 lb 6.4 oz (54.159 kg), SpO2 97.00%. Temp:  [97.8 F (36.6 C)-98.4 F (36.9 C)] 98 F (36.7 C) (04/16 1355) Pulse Rate:  [70-88] 72 (04/16 1355) Resp:  [16-18] 16 (04/16 1355) BP: (94-126)/(59-80) 101/64 mmHg (04/16 1355) SpO2:  [95 %-98 %] 97 % (04/16 1355)  Physical Exam: gen A x O to person and place. Date completely off, 2010, bush is the president HEENT: anicteric sclera, pupils reactive to light and accommodation, EOMI,  CVS regular rate, normal r, no murmur rubs or gallops  Chest: clear to auscultation bilaterally, no wheezing, rales or rhonchi  Abdomen: soft nontender, nondistended, normal bowel sounds,  Extremities: no clubbing or edema noted bilaterally  Skin: vitiligo to hands predominantly Neuro:  nonfocal  Lab Results  Component Value Date   WBC 4.3 06/07/2013   HGB 12.6* 06/07/2013   HCT 36.4* 06/07/2013   MCV 89.4 06/07/2013   PLT 193 06/07/2013   BMET    Component Value Date/Time   NA 136* 06/07/2013 1010   K 4.0 06/07/2013 1010   CL 97 06/07/2013 1010   CO2 30 06/07/2013 1010   GLUCOSE 140* 06/07/2013 1010   BUN 12 06/07/2013 1010   CREATININE 0.68 06/07/2013 2025   CREATININE 0.69 05/22/2013 1548   CALCIUM 9.0 06/07/2013 1010   GFRNONAA >90 06/07/2013 2025   GFRAA >90 06/07/2013 2025     Micro Results: Recent Results (from the past 240 hour(s))  GRAM STAIN     Status: None   Collection Time    06/07/13  3:55 PM      Result Value Ref Range Status   Specimen Description CSF   Final   Special Requests 6.0ML FLUID   Final   Gram Stain     Final   Value: CYTOSPIN PREP     WBC PRESENT, PREDOMINANTLY PMN     NO  ORGANISMS SEEN   Report Status 06/07/2013 FINAL   Final  CSF CULTURE     Status: None   Collection Time    06/07/13  3:55 PM      Result Value Ref Range Status   Specimen Description CSF   Final   Special Requests 6.0ML CSF FLUID   Final   Gram Stain     Final   Value: WBC PRESENT,BOTH PMN AND MONONUCLEAR     NO ORGANISMS SEEN     Performed at [facility] CYTOSPIN     Performed at [facility]   Culture     Final   Value: NO GROWTH 3 DAYS     Performed at [facility]   Report Status 06/10/2013 FINAL   Final  FUNGUS CULTURE W SMEAR     Status: None   Collection Time    06/09/13  1:33 PM      Result Value Ref Range Status   Specimen Description CSF   Final   Special Requests 9.0ML   Final   Fungal Smear     Final   Value: NO YEAST OR FUNGAL ELEMENTS SEEN     Performed at [facility]   Culture     Final   Value: CULTURE IN PROGRESS FOR FOUR WEEKS     Performed at [facility]   Report Status PENDING   Incomplete  VIRAL CULTURE [place]     Status: None   Collection Time    06/09/13  1:33 PM      Result Value Ref Range Status   Specimen Description CSF FLUID   Final   Special Requests Immunocompromised   Final   Culture     Final   Value: CONTINUING TO HOLD     Note:    The current Enterovirus culture system does not detect Enterovirus D68. If Enterovirus D68 is suspected, please call client services to add the test for Enterovirus PCR (Test code [id number]).     Performed at [facility]   Report Status PENDING   Incomplete  AFB CULTURE WITH SMEAR     Status: None   Collection Time    06/09/13  1:33 PM      Result Value Ref Range Status   Specimen Description CSF   Final   Special Requests  9.0ML FLUID   Final   ACID FAST SMEAR     Final   Value: NO ACID FAST BACILLI SEEN     Performed at [facility]   Culture     Final   Value: CULTURE WILL BE EXAMINED FOR 6 WEEKS BEFORE ISSUING A FINAL REPORT     Performed at [facility]   Report Status PENDING   Incomplete  CLOSTRIDIUM DIFFICILE BY PCR     Status: None   Collection Time    06/11/13  6:05 PM      Result Value Ref Range Status   C difficile by pcr NEGATIVE  NEGATIVE Final     Assessment/Plan:  Active Problems:   ATRIOVENTRICULAR BLOCK, MOBITZ TYPE II   Pacemaker- medtronic   Abnormality of gait   Human immunodeficiency virus (HIV) disease   Lower extremity weakness   Syphilis   Malnutrition of moderate degree    Shaun Lutz is a 58 y.o. 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   #1neurosyphilis: VDRL on csf was 1:128. Currently on day 9 of 14 days of high dose penicillin. [facility] also follows up 2 wk course of HD PCN with  3 addn doses of weekly PCN dose in complicated neurosyphilis. unfortunatley did not have enough cells for flow cytology, only cytology which was negative  AFB and fungal are not seen on stains. MTB PCR on csf is still pending. Will check quantiferon.   #2 HIV/AIDS: HIV GENO is pending. Will not start ARV until we can rule out mtB meningitis. Will get repeat cbc and cmp for now  #3 neurocognitive impairment  = unclear if it is related all to syphilis or addn to HIV or other factors. i am very concerned that he has no capacity of understanding his diagnosis or can make appropriate decisions. Please assess for capacity to make medical decision as well as suicide ideation. He was very tearful yesterday with multiple comments of killing himself due to hiv diagnosis, although he does not recall this now.  -   LOS: 8 days   [name] 06/15/2013, 1:57 PM

## 2013-06-15 NOTE — Consult Note (Signed)
[facility] Face-to-Face Psychiatry Consult   Reason for Consult:  Suicidal Ideation Referring Physician:  Dr Knute Neu is an 58 y.o. male. 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 Hypertension   . Second degree AV block   . Syncope   . Pacemaker -Medtronic     DOI 2012   AXIS IV:  other psychosocial or environmental problems and problems related to social environment AXIS V:  31-40 impairment in reality testing  Plan:  Medication management  Subjective:   Shaun Lutz is a 58 y.o. male patient admitted with gate abnormalities and tremors for past several months.  Marland Kitchen  HPI:  Patient seen and chart reviewed.  Pt is 58 year old man who presented with multiple health issues.  Consult was called because patient endorsed suicidal ideations.  Pt denies any previous history of psychiatric illness.  He is currently not taking any psychotropic medication.  Pt is a poor historian and he is newly diagnosed with HIV.  Pt is very concerned with his physical health.  He is fearful and anxious about his future.  Pt has memory impairment.  Pt admitted he has been crying and feeling very isolated and lately experiencing suicidal thoughts with no plan.  Denies any hallucinations or any paranoia but expressed feeling of hopelessness and worthlessness.  Pt has confusion about his HIV status.  Pt denies any homocidal thoughts.  Pt lives with his girlfriend and has no children.  He is currently on disability because of medical reasons.  He denies any drinking or using any illegal substances.  He noticed deteriorating in his physical health however he is unable to provide more detrails.  Recently he was tried on zoloft, smaller dose, however, after one pill pt is experiencing GI symptoms and it was discontinued.  Pt is not agitated or  combative but very confused with memory impairments.  He reported that he does not want to die but he is feeling very sad and depressed because of his illness.     Past Psychiatric History: Past Medical History  Diagnosis Date  . Hypertension   . Second degree AV block   . Syncope   . Pacemaker -Medtronic     DOI 2012    reports that he has been smoking Cigarettes.  He has a 8.75 pack-year smoking history. He does not have any smokeless tobacco history on file. He reports that he does not drink alcohol or use illicit drugs. Family History  Problem Relation Age of Onset  . Cancer Mother   . Heart disease Father   . Heart disease Sister   . Heart disease Brother      Living Arrangements: Non-relatives/Friends   Abuse/Neglect [facility]) Physical Abuse: Denies Verbal Abuse: Denies Sexual Abuse: Denies Allergies:  No Known Allergies  ACT Assessment Complete:  No:   Past Psychiatric History: Diagnosis:  None  Hospitalizations:  None  Outpatient Care:  None  Substance Abuse Care:  Denies  Self-Mutilation:  Denies  Suicidal Attempts:  Denies  Homicidal Behaviors:  Denies   Violent Behaviors:  Denies   Place of Residence:  Lives with GF Marital Status:  single Employed/Unemployed:  unemployed Education:  unknown Family Supports:  limited Objective: Blood pressure 113/76, pulse 69, temperature 97.7 F (36.5 C), temperature source Oral, resp. rate 17, height 5'  8.11" (1.73 m), weight 119 lb 6.4 oz (54.159 kg), SpO2 98.00%.Body mass index is 18.1 kg/(m^2). Results for orders placed during the hospital encounter of 06/07/13 (from the past 72 hour(s))  CBC WITH DIFFERENTIAL     Status: Abnormal   Collection Time    06/15/13  3:00 PM      Result Value Ref Range   WBC 12.5 (*) 4.0 - 10.5 K/uL   RBC 3.75 (*) 4.22 - 5.81 MIL/uL   Hemoglobin 11.5 (*) 13.0 - 17.0 g/dL   HCT 35.4 (*) 39.0 - 52.0 %   MCV 94.4  78.0 - 100.0 fL   MCH 30.7  26.0 - 34.0 pg   MCHC 32.5  30.0 - 36.0 g/dL    RDW 12.6  11.5 - 15.5 %   Platelets 255  150 - 400 K/uL   Neutrophils Relative % 70  43 - 77 %   Neutro Abs 8.7 (*) 1.7 - 7.7 K/uL   Lymphocytes Relative 8 (*) 12 - 46 %   Lymphs Abs 1.0  0.7 - 4.0 K/uL   Monocytes Relative 8  3 - 12 %   Monocytes Absolute 1.0  0.1 - 1.0 K/uL   Eosinophils Relative 14 (*) 0 - 5 %   Eosinophils Absolute 1.8 (*) 0.0 - 0.7 K/uL   Basophils Relative 0  0 - 1 %   Basophils Absolute 0.0  0.0 - 0.1 K/uL  COMPREHENSIVE METABOLIC PANEL     Status: Abnormal   Collection Time    06/15/13  3:00 PM      Result Value Ref Range   Sodium 138  137 - 147 mEq/L   Potassium 4.6  3.7 - 5.3 mEq/L   Chloride 97  96 - 112 mEq/L   CO2 29  19 - 32 mEq/L   Glucose, Bld 96  70 - 99 mg/dL   BUN 10  6 - 23 mg/dL   Creatinine, Ser 0.78  0.50 - 1.35 mg/dL   Calcium 8.8  8.4 - 10.5 mg/dL   Total Protein 7.3  6.0 - 8.3 g/dL   Albumin 2.6 (*) 3.5 - 5.2 g/dL   AST 40 (*) 0 - 37 U/L   ALT 70 (*) 0 - 53 U/L   Alkaline Phosphatase 106  39 - 117 U/L   Total Bilirubin 0.2 (*) 0.3 - 1.2 mg/dL   GFR calc non Af Amer >90  >90 mL/min   GFR calc Af Amer >90  >90 mL/min   Comment: (NOTE)     The eGFR has been calculated using the CKD EPI equation.     This calculation has not been validated in all clinical situations.     eGFR's persistently <90 mL/min signify possible Chronic Kidney     Disease.  GLUCOSE, CAPILLARY     Status: Abnormal   Collection Time    06/15/13  5:38 PM      Result Value Ref Range   Glucose-Capillary 138 (*) 70 - 99 mg/dL   Labs are reviewed.  Current Facility-Administered Medications  Medication Dose Route Frequency Provider Last Rate Last Dose  . aspirin chewable tablet 81 mg  81 mg Oral Daily Andrena Mews, MD   81 mg at 06/15/13 0916  . atenolol (TENORMIN) tablet 25 mg  25 mg Oral Daily Cordelia Poche, MD   25 mg at 06/15/13 0916  . escitalopram (LEXAPRO) tablet 5 mg  5 mg Oral QHS Langston Masker, MD      . feeding supplement (  ENSURE COMPLETE) (ENSURE  COMPLETE) liquid 237 mL  237 mL Oral TID Erlene Quan, RD   237 mL at 06/15/13 1708  . heparin injection 5,000 Units  5,000 Units Subcutaneous 3 times per day Langston Masker, MD   5,000 Units at 06/15/13 1307  . lisinopril (PRINIVIL,ZESTRIL) tablet 5 mg  5 mg Oral Daily Langston Masker, MD   5 mg at 06/15/13 0916  . penicillin G potassium 4 Million Units in dextrose 5 % 250 mL IVPB  4 Million Units Intravenous Q4H Truman Hayward, MD   4 Million Units at 06/15/13 1708  . sodium chloride 0.9 % injection 3 mL  3 mL Intravenous Q12H Langston Masker, MD   3 mL at 06/15/13 1000    Psychiatric Specialty Exam:     Blood pressure 113/76, pulse 69, temperature 97.7 F (36.5 C), temperature source Oral, resp. rate 17, height 5' 8.11" (1.73 m), weight 119 lb 6.4 oz (54.159 kg), SpO2 98.00%.Body mass index is 18.1 kg/(m^2).  General Appearance: Disheveled  Eye Contact::  Poor  Speech:  Slow and Slurred  Volume:  Decreased  Mood:  Anxious and Depressed  Affect:  Constricted and Depressed  Thought Process:  Loose  Orientation:  NA  Thought Content:  Rumination  Suicidal Thoughts:  Yes.  without intent/plan  Homicidal Thoughts:  No  Memory:  Immediate;   Poor Recent;   Poor Remote;   Poor  Judgement:  Impaired  Insight:  Lacking  Psychomotor Activity:  Decreased  Concentration:  Poor  Recall:  Poor  Fund of Knowledge:Poor  Language: Poor  Akathisia:  No  Handed:  Right  AIMS (if indicated):     Assets:  Housing  Sleep:      Musculoskeletal: Strength & Muscle Tone: atrophy Gait & Station: patient lying on bed Patient leans: N/A  Treatment Plan Summary: Medication management, Start lexapro 5 mg daily if not contraindicated, Continue sitter as patient is very confused and continue to exhibit suicidal thoughts. C/L will service follow up, if patient remains in the hospital. Please call [phone] if you have any question.   Arlyce Harman Jaiyana Canale 06/15/2013 7:24 PM

## 2013-06-15 NOTE — Consult Note (Signed)
Physical Medicine and Rehabilitation Consult Reason for Consult: Deconditioning/HIV/syphilis Referring Physician: Dr Scarlette Calico   HPI: Shaun Lutz is a 58 y.o. right handed male history of hypertension, COPD, heart block status post pacemaker. Patient lives with his girlfriend. Admitted 06/07/2013 with progressive gait disorder as well as hand tremors with recent fall. Patient tested positive HIV as well as syphilis with RPR of 1:512. Lumbar puncture showed white blood cell count of 86 with 60% segmented neutrophils 35% lymphocytes. CSF protein greater than 600 and glucose of 7. Gram stain negative for any organisms and cryptococcal antigen on CSF was negative. 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 Infectious disease consulted plan treatment of 14 days of high-dose penicillin and 3 doses of weekly PCN. Started on ARV with workup ongoing. Subcutaneous heparin for DVT prophylaxis. Physical and occupational therapy evaluations completed 06/14/2013 with recommendations for physical medicine rehabilitation consultation  Patient does not remember coming to the hospital  Review of Systems  HENT: Positive for hearing loss.   Cardiovascular: Positive for palpitations.  Musculoskeletal: Positive for falls.  Neurological: Positive for weakness.       Syncope  All other systems reviewed and are negative.  Past Medical History  Diagnosis Date  . Hypertension   . Second degree AV block   . Syncope   . Pacemaker -Medtronic     DOI 2012   Past Surgical History  Procedure Laterality Date  . Pacemaker insertion     Family History  Problem Relation Age of Onset  . Cancer Mother   . Heart disease Father   . Heart disease Sister   . Heart disease Brother    Social History:  reports that he has been smoking Cigarettes.  He has a 8.75 pack-year smoking history. He does not have any smokeless  tobacco history on file. He reports that he does not drink alcohol or use illicit drugs. Allergies: No Known Allergies Medications Prior to Admission  Medication Sig Dispense Refill  . aspirin 81 MG tablet Take 1 tablet (81 mg total) by mouth daily.  180 tablet  2  . atenolol (TENORMIN) 25 MG tablet Take 25 mg by mouth daily.      Marland Kitchen lisinopril (PRINIVIL,ZESTRIL) 5 MG tablet Take 1 tablet (5 mg total) by mouth daily.  90 tablet  3  . Nutritional Supplements (ENSURE ACTIVE HIGH PROTEIN) LIQD Take 1 Can by mouth 3 (three) times daily.  60 Can  2  . carbidopa-levodopa (SINEMET IR) 25-100 MG per tablet Take 1 tablet by mouth 3 (three) times daily.  90 tablet  5    Home: Home Living Family/patient expects to be discharged to:: Private residence Living Arrangements: Non-relatives/Friends Available Help at Discharge: Other (Comment) (girlfriend) Type of Home: House Home Access: Stairs to enter Technical brewer of Steps: 4 Entrance Stairs-Rails: Right Home Layout: One level Home Equipment: None  Functional History: Prior Function Level of Independence: Independent Comments: states girlfriend is disabled and has an aide that does cleaning, cooking, etc Functional Status:  Mobility: Bed Mobility Overal bed mobility: Needs Assistance Bed Mobility: Supine to Sit Supine to sit: HOB elevated;Mod assist General bed mobility comments: guided feet off bed and assist to lift trunk Transfers Overall transfer level: Needs assistance Equipment used: 2 person hand held assist Transfers: Sit to/from Stand;Stand Pivot Transfers Sit to Stand: +2 physical assistance;Mod assist Stand pivot transfers: Mod assist;+2 physical assistance  General transfer comment: lifting assist from bed; pivot back to bed from wheelchair, +2 due to ataxia, tremors Ambulation/Gait Ambulation/Gait assistance: Mod assist;+2 physical assistance Ambulation Distance (Feet): 80 Feet Assistive device: 2 person hand held  assist;1 person hand held assist Gait Pattern/deviations: Step-through pattern;Ataxic;Shuffle;Decreased stride length General Gait Details: was walking near nurses station and talking, noting HR up to 122; patient became limp and tried to lower himself to floor; tech and PT held pt up till chair obtained for pt.    ADL: ADL Overall ADL's : Needs assistance/impaired Grooming: Moderate assistance;Standing Grooming Details (indicate cue type and reason): +2 physical assist fot balance Upper Body Bathing: Minimal assitance;Sitting Lower Body Bathing: Moderate assistance;Sitting/lateral leans;Sit to/from stand Upper Body Dressing : Minimal assistance;Sitting Lower Body Dressing: Maximal assistance;Sit to/from stand;Sitting/lateral leans Toilet Transfer: Moderate assistance;Minimal assistance;BSC Toilet Transfer Details (indicate cue type and reason): +2 physical assist, cues for safety Toileting- Clothing Manipulation and Hygiene: Total assistance;Sit to/from stand General ADL Comments: cues for safety, decreased standing balance  Cognition: Cognition Overall Cognitive Status: Impaired/Different from baseline Orientation Level: Oriented to person;Oriented to place;Disoriented to time;Disoriented to place Cognition Arousal/Alertness: Awake/alert Behavior During Therapy: Restless Overall Cognitive Status: Impaired/Different from baseline Area of Impairment: Orientation Orientation Level: Time  Blood pressure 94/59, pulse 70, temperature 97.8 F (36.6 C), temperature source Oral, resp. rate 18, height 5' 8.11" (1.73 m), weight 119 lb 6.4 oz (54.159 kg), SpO2 97.00%. Physical Exam  Constitutional:  58 year old male appearing older than stated age.  HENT:  Poor dentition  Eyes: EOM are normal.  Neck: Normal range of motion. Neck supple. No thyromegaly present.  Cardiovascular: Normal rate and regular rhythm.   Respiratory: Effort normal and breath sounds normal. No respiratory distress.   GI: Soft. Bowel sounds are normal. He exhibits no distension.  Neurological: He is alert.  Patient follows simple commands. He was able to provide his name and place. He was unsure of his age.  Skin: Skin is warm and dry.   intention tremor bilateral lower extremities greater than bilateral upper extremities Motor strength is 4+/5 bilateral deltoid, bicep, tricep, grip  No results found for this or any previous visit (from the past 24 hour(s)). No results found.  Assessment/Plan: Diagnosis: Neurosyphilis, newly diagnosed HIV 1. Does the need for close, 24 hr/day medical supervision in concert with the patient's rehab needs make it unreasonable for this patient to be served in a less intensive setting? Yes 2. Co-Morbidities requiring supervision/potential complications: History of AV block, COPD 3. Due to bladder management, bowel management, safety, skin/wound care, disease management, medication administration, pain management and patient education, does the patient require 24 hr/day rehab nursing? Yes 4. Does the patient require coordinated care of a physician, rehab nurse, PT (1-2 hrs/day, 5 days/week), OT (1-2 hrs/day, 5 days/week) and SLP (0.5-1 hrs/day, 5 days/week) to address physical and functional deficits in the context of the above medical diagnosis(es)? Yes Addressing deficits in the following areas: balance, endurance, locomotion, strength, transferring, bowel/bladder control, bathing, dressing, toileting and cognition 5. Can the patient actively participate in an intensive therapy program of at least 3 hrs of therapy per day at least 5 days per week? Yes 6. The potential for patient to make measurable gains while on inpatient rehab is fair 7. Anticipated functional outcomes upon discharge from inpatient rehab are supervision  with PT, supervision with OT, supervision with SLP. 8. Estimated rehab length of stay to reach the above functional goals is: 14-18 days 9. Does the patient  have  adequate social supports to accommodate these discharge functional goals? Potentially 10. Anticipated D/C setting: Home 11. Anticipated post D/C treatments: [place] therapy 12. Overall Rehab/Functional Prognosis: fair  RECOMMENDATIONS: This patient's condition is appropriate for continued rehabilitative care in the following setting: CIR Patient has agreed to participate in recommended program. Potentially Note that insurance prior authorization may be required for reimbursement for recommended care.  Comment:     06/15/2013

## 2013-06-15 NOTE — Progress Notes (Signed)
Family Medicine Teaching Service Daily Progress Note Intern Pager: [phone]  Patient name: Shaun Lutz Medical record number: [id number] Date of birth: November 14, 1955 Age: 58 y.o. Gender: male  Primary Care Provider: Donnamae Jude, MD Consultants: ID, neuro Code Status: full  Pt Overview and Major Events to Date: Shaun Lutz is a 58 y.o. 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 Lucianne Lei Dam's note 4/11: soiled himself overnight, awoke confused, so Zoloft discontinued; otherwise little change 4/13: severe panic attacks, perseverative, ativan 4m 4/14: HIV med assistance and HIV genomic studies 4/15: CM/SW and PT/OT to eval regarding placement 4/16: 1:1 for passive suicidal ideation, discussion re placement, consideration of CIR 4/17: formal CIR evaluation, Psych to assess capacity  Assessment and Plan: GHandy Mcloudis a 58y.o. 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  - await diagnostics from repeat LP and pending labs for HIV prior to starting any antiretroviral meds; CSF TB now  negative -f/up repeat LP results   - CSF culture / studies (see results review)  - TB by PCR (NOT DETECTED)  - HSV by PCR (NEGATIVE)  - fungal with smear (negative smear, Cx pending)  - AFB with smear (negative smear, Cx pending)  - viral culture (prelim neg X4 days)  - cytology (no malig cells) -ID following greatly appreciate recs and management, will touch base regarding therapy in light of neg TB -covering with high-dose Pen-G IV for 2 weeks duration(started 4/9--) Day 10 today; will also need 3 additional doses of weekly PCN -HIV genomic studies in process:   - HIV RNA quant approx 60,000; CD4 250  - HIV integrade genotype/genotype plus in process   - HLA B*[id number]  #Neuro - Gait abnormality, hearing loss, CNS infection, cogwheel rigidity  DDx includes sequelae of HIV/syphilis vs parkinsonian features vs demyelinating d/o.Likely patient having features related to progression of infectious disease (see above).  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  2D echo 4/10 grossly normal, Borderline hypotensive this morning and elevated ALT/AST (70/40), cont to monitor on tele - hold lisinopril  - cont atenolol, pacer interrogated; consider formal cards consult if needed - cont ASA   #Pulm- COPD? Bullous changes seen on CT chest with bilat infiltrates -no prescriptions for COPD currently  -vitals per floor protocol, will cont to monitor with pulse ox   #Psych/Social Suicidal ideation- in light of recent diagnosis, occasional passive thoughts no active plan; did not respond to zoloft, started lexapro yest - 1:1 for now but  will continually reassess  - cont lexapro 34m - ask for reassessment re-capacity, appreciate psych assistance  #FEN/GI: heart healthy diet, saline  lock IV; replete electrolytes prn -hold PO if patient anxious  Disposition: finalized ID recs for completion of course; CM/CIR for insurance approval; psych for capacity to make decision for CIR  Subjective:  Had 6 runs of Vtach yesterday per telemetry; asymptomatic; "I think I am dying, I don't know if I could kill myself. Do I have Alzheimers?"  Objective: Temp:  [97.7 F (36.5 C)-98.4 F (36.9 C)] 97.7 F (36.5 C) (04/16 1700) Pulse Rate:  [69-81] 69 (04/16 1700) Resp:  [16-18] 17 (04/16 1700) BP: (94-118)/(59-76) 113/76 mmHg (04/16 1700) SpO2:  [95 %-98 %] 98 % (04/16 1700) Physical Exam: Gen: NAD; cooperative with exam this morning  HEENT: NCAT, EOMI, PERRL, poor dentition Neck: normal ROM, supple, no rigidity CV: RRR without definite murmur, cap refill <3  Resp: CTABL, no wheezes, non-labored  Abd: thin, scaphoid, SNTND, BS present, no guarding or organomegaly  Ext: thin extremities, warm, normal tone, Neuro: oriented to place and self only (thought 1920, but was able to reason that this did not make sense); otherwise little change in neuro status Skin: vitiligo, multiple hyperpigmented macules throughout entire body including palms and soles; scattered excoriations without evidence for skin superinfection  Laboratory: utox neg  U/a neg apart from 2.0 protein  CSF gram stain no organism  Crypto ag neg  See results review and A/P for extensive CSF study results  Imaging/Diagnostic Tests: CT head 06/07/13 IMPRESSION:  1. Hypoattenuation in the left temporal tip appears parenchymal and  are likely related to ischemia of and an extra-axial arachnoid cyst.  2. Diffuse subcortical white matter hypoattenuation bilaterally is  stable. 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  3. Mild prominence the ventricles bilaterally is likely due to  atrophy. Mild hydrocephalus is considered less likely. Lumbar  puncture may be useful for further evaluation.   CT cervical spine  06/08/13 IMPRESSION:  1. No significant para meningeal focus of infection is identified.  2. Multilevel degenerative cervical spine disease as described.  3. Stable vertebral body heights and alignment.  4. Hypodense lesion within the right parotid gland measures 10 x 15  mm. This could represent a hyperdense lymph node associated with  HIV. However, no other significant adenopathy is present. A primary  parotid lesion is also considered.  5. Inguinal lymph nodes bilaterally are likely related to HIV.  6. Bilateral pleural effusions. Infection is not excluded.  7. Mass like area dependently in the left lower lobe likely  represents rounded atelectasis or potentially some fluid trapped  within the fissure.  8. 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   MLangston Masker MD 06/15/2013, 8:17 PM PGY-1, CAmity GardensIntern pager: [phone] text pages welcome

## 2013-06-15 NOTE — Progress Notes (Signed)
Occupational Therapy Treatment Patient Details Name: Shaun Lutz MRN: [id number] DOB: 09/10/1955 Today's Date: 06/15/2013    History of present illness Shaun Lutz is a 58 y.o. 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   OT comments  Pt mkanig progress with functional goals. Pt's tremors/shaking increased once seated EOB this session. Pt's nurse tech.sitter report that he had no difficulty with tremors earlier today to feed himself. Pt should continue with acute OT services to increase level of function and safey  Follow Up Recommendations  CIR;Supervision/Assistance - 24 hour    Equipment Recommendations   TBD at next venue of care   Recommendations for Other Services      Precautions / Restrictions Precautions Precautions: Fall Precaution Comments: intermittent body shaking tremors Restrictions Weight Bearing Restrictions: No       Mobility Bed Mobility Overal bed mobility: Needs Assistance Bed Mobility: Supine to Sit;Sit to Supine     Supine to sit: Min assist;HOB elevated     General bed mobility comments: used rails to sit up, assist with LEs back onto bed  Transfers Overall transfer level: Needs assistance Equipment used: 2 person hand held assist Transfers: Sit to/from Stand Sit to Stand: +2 physical assistance;Mod assist;Min assist         General transfer comment: tremors increased with sup - sit and sit - stand. Pt able to stand x 7 minutes for nusre tech to change bed linens and for pt to perform grooming tasks    Balance   Sitting-balance support: No upper extremity supported;Feet supported Sitting balance-Leahy Scale: Fair     Standing balance support: Single extremity supported;Bilateral upper extremity supported;During functional  activity Standing balance-Leahy Scale: Poor                     ADL       Grooming: Moderate assistance;Standing;Minimal assistance Grooming Details (indicate cue type and reason): +2 physical assist fot balance, temors/shaking increased Upper Body Bathing: Set up;Min guard;Sitting   Lower Body Bathing: Moderate assistance;Sitting/lateral leans;Sit to/from stand   Upper Body Dressing : Sitting;Set up;Min guard   Lower Body Dressing: Sitting/lateral leans;Moderate assistance     Toilet Transfer Details (indicate cue type and reason): tremors increased with sup - sit and sit - stand. Pt able to stand x 7 minutes for nusre tech to change bed linens and for pt to perform grooming tasks Toileting- Clothing Manipulation and Hygiene: Total assistance;Sit to/from stand       Functional mobility during ADLs: Minimal assistance;Moderate assistance;+2 for physical assistance General ADL Comments: tremors increased                                      Cognition   Behavior During Therapy: WFL for tasks assessed/performed Overall Cognitive Status: Impaired/Different from baseline Area of Impairment: Orientation Orientation Level: Disoriented to;Time;Situation   Memory: Decreased short-term memory                                          General Comments  Pt very pleasant, cooperative and appreciative    Pertinent Vitals/  Pain       No c/o pain, VSS                                                          Frequency Min 2X/week     Progress Toward Goals  OT Goals(current goals can now be found in the care plan section)  Progress towards OT goals: Progressing toward goals  Acute Rehab OT Goals Patient Stated Goal: To get stronger  Plan Discharge plan remains appropriate                     End of Session Equipment Utilized During Treatment: Gait belt   Activity Tolerance Patient tolerated treatment  well   Patient Left in chair;with call bell/phone within reach;with nursing/sitter in room;with family/visitor present             Time: 1336-1400 OT Time Calculation (min): 24 min  Charges: OT General Charges $OT Visit: 1 Procedure OT Treatments $Self Care/Home Management : 8-22 mins $Therapeutic Activity: 8-22 mins  Mosetta Putt 06/15/2013, 2:27 PM

## 2013-06-15 NOTE — Progress Notes (Signed)
FMTS Attending Note Patient seen and examined by me, discussed with resident team and I agree with Dr Burt Ek assessment and plan as documented.  Patient is emotionally labile but does not endorse thoughts of self-harm.  Is alert, oriented to place [facility]"), exhibits resting tremor of upper extremities on exam.  Agree with plan as documented by Dr Skeet Simmer.  For CIR evaluation for appropriateness of Inpatient Rehab upon medical discharge.  Appreciate ID plan for treatment of neurosyphilis and HIV. Dalbert Mayotte, MD

## 2013-06-16 DIAGNOSIS — F329 Major depressive disorder, single episode, unspecified: Secondary | ICD-10-CM

## 2013-06-16 DIAGNOSIS — F0280 Dementia in other diseases classified elsewhere without behavioral disturbance: Secondary | ICD-10-CM

## 2013-06-16 DIAGNOSIS — R45851 Suicidal ideations: Secondary | ICD-10-CM

## 2013-06-16 DIAGNOSIS — A523 Neurosyphilis, unspecified: Secondary | ICD-10-CM | POA: Diagnosis not present

## 2013-06-16 DIAGNOSIS — F3289 Other specified depressive episodes: Secondary | ICD-10-CM

## 2013-06-16 LAB — M. TUBERCULOSIS COMPLEX BY PCR: M. tuberculosis, Direct: NOT DETECTED

## 2013-06-16 LAB — HLA B*5701: HLA B 5701: POSITIVE

## 2013-06-16 MED ORDER — DIPHENHYDRAMINE HCL 25 MG PO CAPS
25.0000 mg | ORAL_CAPSULE | Freq: Four times a day (QID) | ORAL | Status: DC | PRN
  Administered 2013-06-17 (×3): 25 mg via ORAL
  Filled 2013-06-16 (×3): qty 1

## 2013-06-16 MED ORDER — ELVITEG-COBIC-EMTRICIT-TENOFDF 150-150-200-300 MG PO TABS
1.0000 | ORAL_TABLET | Freq: Every day | ORAL | Status: DC
  Administered 2013-06-17 – 2013-06-18 (×2): 1 via ORAL
  Filled 2013-06-16 (×3): qty 1

## 2013-06-16 NOTE — Progress Notes (Signed)
Family Practice Teaching Service Interval Progress Note  Called by nursing to evaluate the patient due to itching and scratching at his buttocks to the point of excoriation and bleeding. On evaluation the patient has an area of redness and excoriation on bilateral butt cheeks surrounding his anus, there are several small areas of bleeding. I discussed with the patient that he needed to avoid scratching if possible. I will write for some benadryl prn and the nurse is to place protective foam padding. Additionally we will place him on contact precautions given bleeding and HIV status to ensure that all healthcare workers use protective gloves and gowns when in the room.  Tommi Rumps, MD Family Medicine PGY-2 Service Pager [phone]

## 2013-06-16 NOTE — Progress Notes (Signed)
Occupational Therapy Treatment Patient Details Name: Shaun Lutz MRN: [id number] DOB: 09/25/1955 Today's Date: 06/16/2013    History of present illness Shaun Lutz is a 58 y.o. 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   OT comments  Pt making progress with functional goals and should continue with acute OT services to increase level of function and safety. Pt with decreased tremors this session  Follow Up Recommendations  CIR;Supervision/Assistance - 24 hour    Equipment Recommendations  None recommended by OT;Other (comment) (TBD at next venue of care)    Recommendations for Other Services      Precautions / Restrictions Precautions Precautions: Fall Precaution Comments: intermittent body shaking tremors Restrictions Weight Bearing Restrictions: No       Mobility Bed Mobility Overal bed mobility: Needs Assistance Bed Mobility: Supine to Sit;Sit to Supine     Supine to sit: Supervision Sit to supine: Min assist   General bed mobility comments: used rails to sit EOB, required min A with LEs back onto bed/. Able to scoot to [facility] using rails and pushing with LEs  Transfers Overall transfer level: Needs assistance Equipment used: 1 person hand held assist Transfers: Sit to/from Stand Sit to Stand: Mod assist         General transfer comment: temors increased when initiating sit - stand. Able to stand at sink x 5 minutes for grooming tasks wiht mod A for balance/support    Balance   Sitting-balance support: No upper extremity supported;Feet supported Sitting balance-Leahy Scale: Fair     Standing balance support: Single extremity supported;Bilateral upper extremity supported;During functional activity Standing balance-Leahy Scale: Poor                      ADL       Grooming: Standing;Minimal assistance Grooming Details (indicate cue type and reason): mod A for balane/support standing at sink Upper Body Bathing: Set up;Min guard;Sitting   Lower Body Bathing: Moderate assistance;Sit to/from stand   Upper Body Dressing : Sitting;Set up;Min guard   Lower Body Dressing: Minimal assistance;Sitting/lateral leans (to donn socks at EOB)   Toilet Transfer: Moderate assistance;BSC Toilet Transfer Details (indicate cue type and reason): temors increased when initiating sit - stand. Able to stand at sink x 5 minutes for grooming tasks wiht mod A for balance/support Toileting- Clothing Manipulation and Hygiene: Sit to/from stand;Maximal assistance         General ADL Comments: tremors, decreased coordination/balance. Pt stood at sink x 5 minutes for grooming tasks before LOB, then seated on 3 in 1 to complete tasks                           Perception Perception Perception Tested?: No   Praxis Praxis Praxis tested?: Not tested    Cognition   Behavior During Therapy: [facility] for tasks assessed/performed Overall Cognitive Status: Impaired/Different from baseline                                                  General Comments  Pt pleasant and cooperative, VSS    Pertinent Vitals/ Pain  No c/o pain, VSS                                                          Frequency Min 2X/week     Progress Toward Goals  OT Goals(current goals can now be found in the care plan section)  Progress towards OT goals: Progressing toward goals     Plan Discharge plan remains appropriate                     End of Session Equipment Utilized During Treatment: Other (comment);Gait belt (BSC)   Activity Tolerance Patient tolerated treatment well   Patient Left with call bell/phone within reach;in bed;Other (comment) (with PT)   Nurse Communication           Time: 7342-8768 OT Time Calculation (min): 31 min  Charges: OT General Charges $OT Visit: 1 Procedure OT Treatments $Self Care/Home Management : 8-22 mins $Therapeutic Activity: 8-22 mins  Mosetta Putt 06/16/2013, 3:05 PM

## 2013-06-16 NOTE — Progress Notes (Signed)
Physical Therapy Treatment Patient Details Name: Shaun Lutz MRN: [id number] DOB: 07-Jan-1956 Today's Date: 06/16/2013    History of Present Illness Madoc Holquin is a 58 y.o. 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    PT Comments    Patient progressing with tolerance to ambulation, but still with occasional buckling of bilateral knees.  Will need CIR therapies at d/c.  Follow Up Recommendations  Supervision/Assistance - 24 hour;CIR     Equipment Recommendations  Rolling walker with 5" wheels    Recommendations for Other Services       Precautions / Restrictions Precautions Precautions: Fall Precaution Comments: intermittent body shaking tremors Restrictions Weight Bearing Restrictions: No    Mobility  Bed Mobility Overal bed mobility: Needs Assistance Bed Mobility: Supine to Sit;Sit to Supine     Supine to sit: Supervision;HOB elevated Sit to supine: Supervision;HOB elevated   General bed mobility comments: use of bed rail to sit up; slow with getting legs into bed due to weakness; mod cues for technique  Transfers Overall transfer level: Needs assistance Equipment used: Rolling walker (2 wheeled) Transfers: Sit to/from Stand Sit to Stand: Min assist         General transfer comment: cues for hand placement, assist for safety due to intermittent tremors and initially with decreased safety awareness  Ambulation/Gait Ambulation/Gait assistance: Mod assist;+2 safety/equipment Ambulation Distance (Feet): 200 Feet Assistive device: Rolling walker (2 wheeled) Gait Pattern/deviations: Step-through pattern;Shuffle;Trunk flexed     General Gait Details: two episodes of knees buckling and needing seated rest total of 200' with rest after 30' and then 130'.   +1 assist with nurse tech following with chair   Stairs            Wheelchair Mobility    Modified Rankin (Stroke Patients Only)       Balance Overall balance assessment: History of Falls;Needs assistance Sitting-balance support: No upper extremity supported;Feet supported Sitting balance-Leahy Scale: Fair     Standing balance support: Bilateral upper extremity supported Standing balance-Leahy Scale: Poor Standing balance comment: needs assist due to tremors and at times both knees buckle                    Cognition Arousal/Alertness: Awake/alert Behavior During Therapy: WFL for tasks assessed/performed Overall Cognitive Status: Impaired/Different from baseline Area of Impairment: Orientation Orientation Level: Time   Memory: Decreased short-term memory              Exercises General Exercises - Lower Extremity Hip Flexion/Marching: AROM;Both;10 reps;Standing Heel Raises: AROM;Both;10 reps;Standing Other Exercises Other Exercises: sit<>stand x 5 without UE assist at bedside    General Comments        Pertinent Vitals/Pain No c/o pain    Home Living                      Prior Function            PT Goals (current goals can now be found in the care plan section) Progress towards PT goals: Progressing toward goals    Frequency  Min 3X/week    PT Plan Current plan remains appropriate    Co-evaluation             End of Session Equipment Utilized During  Treatment: Gait belt Activity Tolerance: Patient tolerated treatment well Patient left: in bed;with call bell/phone within reach;with bed alarm set;with nursing/sitter in room     Time: 1450-1525 PT Time Calculation (min): 35 min  Charges:  $Gait Training: 8-22 mins $Therapeutic Exercise: 8-22 mins                    G Codes:      Max Sane 07-03-13, 4:02 PM Magda Kiel, [employer] 07-03-13

## 2013-06-16 NOTE — Progress Notes (Signed)
[place] for Infectious Disease    Day # 9 of full 24 MU of IV PCN  Subjective:  ONCE again he does not remember that he has been diagnosed with HIV and syphilis and once again when presented with this information he became distraught tearful and began to voice suicidal thoughts. I calmed him down and informed him that the syphilis was curable and HIV while not curable was controllable and 20 started him on a pill for this today.  Antibiotics:  Anti-infectives   Start     Dose/Rate Route Frequency Ordered Stop   06/17/13 0800  elvitegravir-cobicistat-emtricitabine-tenofovir (STRIBILD) 150-150-200-300 MG tablet 1 tablet     1 tablet Oral Daily with breakfast 06/16/13 1429     06/08/13 0200  penicillin G potassium 4 Million Units in dextrose 5 % 250 mL IVPB     4 Million Units 250 mL/hr over 60 Minutes Intravenous Every 4 hours 06/08/13 0033     06/07/13 2200  cefTRIAXone (ROCEPHIN) 2 g in dextrose 5 % 50 mL IVPB  Status:  Discontinued     2 g 100 mL/hr over 30 Minutes Intravenous Every 12 hours 06/07/13 1847 06/07/13 1852   06/07/13 2200  cefTRIAXone (ROCEPHIN) 2 g in dextrose 5 % 50 mL IVPB  Status:  Discontinued    Comments:  Please draw blood cultures before antibiotics   2 g 100 mL/hr over 30 Minutes Intravenous Every 12 hours 06/07/13 1852 06/08/13 1459   06/07/13 2200  acyclovir (ZOVIRAX) 525 mg in dextrose 5 % 100 mL IVPB  Status:  Discontinued     10 mg/kg  52.5 kg 110.5 mL/hr over 60 Minutes Intravenous 3 times per day 06/07/13 1915 06/09/13 1041   06/07/13 2000  vancomycin (VANCOCIN) IVPB 750 mg/150 ml premix  Status:  Discontinued     750 mg 150 mL/hr over 60 Minutes Intravenous Every 12 hours 06/07/13 1915 06/08/13 1459   06/07/13 2000  Ampicillin-Sulbactam (UNASYN) 3 g in sodium chloride 0.9 % 100 mL IVPB  Status:  Discontinued     3 g 100 mL/hr over 60 Minutes Intravenous Every 6 hours 06/07/13 1915 06/08/13 0033      Medications: Scheduled Meds: .  aspirin  81 mg Oral Daily  . atenolol  25 mg Oral Daily  . [START ON 06/17/2013] elvitegravir-cobicistat-emtricitabine-tenofovir  1 tablet Oral Q breakfast  . escitalopram  5 mg Oral QHS  . feeding supplement (ENSURE COMPLETE)  237 mL Oral TID  . heparin  5,000 Units Subcutaneous 3 times per day  . pencillin G potassium IV  4 Million Units Intravenous Q4H  . sodium chloride  3 mL Intravenous Q12H   Continuous Infusions:  PRN Meds:.    Objective: Weight change:   Intake/Output Summary (Last 24 hours) at 06/16/13 1829 Last data filed at 06/16/13 1730  Gross per 24 hour  Intake   1970 ml  Output   2075 ml  Net   -105 ml   Blood pressure 102/68, pulse 64, temperature 98.9 F (37.2 C), temperature source Oral, resp. rate 18, height 5' 8.11" (1.73 m), weight 118 lb 6.2 oz (53.7 kg), SpO2 98.00%. Temp:  [97.9 F (36.6 C)-98.9 F (37.2 C)] 98.9 F (37.2 C) (04/17 1700) Pulse Rate:  [63-82] 64 (04/17 1700) Resp:  [17-18] 18 (04/17 1700) BP: (93-115)/(52-80) 102/68 mmHg (04/17 1700) SpO2:  [96 %-99 %] 98 % (04/17 1700) Weight:  [118 lb 6.2 oz (53.7 kg)] 118 lb 6.2 oz (53.7 kg) (04/16  2121)  Physical Exam: AO person but not situation HEENT: anicteric sclera, , EOMI,  CVS regular rate, normal r, no murmur rubs or gallops  Chest: clear to auscultation bilaterally, no wheezing, rales or rhonchi  Abdomen: soft nontender, nondistended, normal bowel sounds,   Skin: hyperpigmented lesions seen below and pictures   He has scratched open several sites       Neuro:  nonfocal  CBC:  Recent Labs Lab 06/15/13 1500  HGB 11.5*  HCT 35.4*  PLT 255     BMET  Recent Labs  06/15/13 1500  NA 138  K 4.6  CL 97  CO2 29  GLUCOSE 96  BUN 10  CREATININE 0.78  CALCIUM 8.8     Liver Panel   Recent Labs  06/15/13 1500  PROT 7.3  ALBUMIN 2.6*  AST 40*  ALT 70*  ALKPHOS 106  BILITOT 0.2*       Sedimentation Rate No results found for this basename: ESRSEDRATE,   in the last 72 hours C-Reactive Protein No results found for this basename: CRP,  in the last 72 hours  Micro Results: Recent Results (from the past 240 hour(s))  GRAM STAIN     Status: None   Collection Time    06/07/13  3:55 PM      Result Value Ref Range Status   Specimen Description CSF   Final   Special Requests 6.0ML FLUID   Final   Gram Stain     Final   Value: CYTOSPIN PREP     WBC PRESENT, PREDOMINANTLY PMN     NO ORGANISMS SEEN   Report Status 06/07/2013 FINAL   Final  CSF CULTURE     Status: None   Collection Time    06/07/13  3:55 PM      Result Value Ref Range Status   Specimen Description CSF   Final   Special Requests 6.0ML CSF FLUID   Final   Gram Stain     Final   Value: WBC PRESENT,BOTH PMN AND MONONUCLEAR     NO ORGANISMS SEEN     Performed at [facility] CYTOSPIN     Performed at [facility]   Culture     Final   Value: NO GROWTH 3 DAYS     Performed at [facility]   Report Status 06/10/2013 FINAL   Final  FUNGUS CULTURE W SMEAR     Status: None   Collection Time    06/09/13  1:33 PM      Result Value Ref Range Status   Specimen Description CSF   Final   Special Requests 9.0ML   Final   Fungal Smear     Final   Value: NO YEAST OR FUNGAL ELEMENTS SEEN     Performed at [facility]   Culture     Final   Value: CULTURE IN PROGRESS FOR FOUR WEEKS     Performed at [facility]   Report Status PENDING   Incomplete  VIRAL CULTURE [place]     Status: None   Collection Time    06/09/13  1:33 PM      Result Value Ref Range Status   Specimen Description CSF FLUID   Final   Special Requests Immunocompromised   Final   Culture     Final   Value: CONTINUING TO HOLD     Note:    The current Enterovirus culture system does not detect Enterovirus D68. If Enterovirus  D68 is suspected, please call client services to add the test for Enterovirus PCR (Test code [phone]).     Performed at [facility]   Report Status  PENDING   Incomplete  AFB CULTURE WITH SMEAR     Status: None   Collection Time    06/09/13  1:33 PM      Result Value Ref Range Status   Specimen Description CSF   Final   Special Requests 9.0ML FLUID   Final   ACID FAST SMEAR     Final   Value: NO ACID FAST BACILLI SEEN     Performed at [facility]   Culture     Final   Value: CULTURE WILL BE EXAMINED FOR 6 WEEKS BEFORE ISSUING A FINAL REPORT     Performed at [facility]   Report Status PENDING   Incomplete  M. TUBERCULOSIS COMPLEX BY PCR     Status: None   Collection Time    06/09/13  1:33 PM      Result Value Ref Range Status   M. tuberculosis, Direct Not detected  Not detected Final   Comment: (NOTE)     This test(s) was developed and its performance characteristics      have been determined by [employer],      [place], [place]. Performance characteristics refer to the analytical      performance of the test.   Source (MTBPCR) Cerebrospinal Fluid   Corrected   Comment: Performed at [place] 04/17 AT 0430: PREVIOUSLY REPORTED AS CSF  CLOSTRIDIUM DIFFICILE BY PCR     Status: None   Collection Time    06/11/13  6:05 PM      Result Value Ref Range Status   C difficile by pcr NEGATIVE  NEGATIVE Final    Studies/Results: No results found.    Assessment/Plan:  Active Problems:   ATRIOVENTRICULAR BLOCK, MOBITZ TYPE II   Pacemaker- medtronic   Abnormality of gait   Human immunodeficiency virus (HIV) disease   Lower extremity weakness   Syphilis   Malnutrition of moderate degree    Demetry Bendickson is a 58 y.o. 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 He is NOT competent and this was confirmed by psychiatry. He will likely need placment long term as I dont think he will be cabpable of caring for himself  #4 Depression and SI: Perhaps best NOT to continually bring up his diagnoses since he apparently forgets yet again, Hopefully there is some improvement in his cognition he is on SSRI  Dr. Megan Salon available on the weekend for questions. ;     LOS: 9 days   Truman Hayward 06/16/2013, 6:29 PM

## 2013-06-16 NOTE — Consult Note (Signed)
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 Hypertension   . Second degree AV block   . Syncope   . Pacemaker -Medtronic     DOI 2012   AXIS IV:  other psychosocial or environmental problems and problems related to social environment AXIS V:  31-40 impairment in reality testing  Plan:  Medication management  Subjective:   Shaun Lutz is a 58 y.o. male patient admitted with gate abnormalities and tremors for past several months.    Patient seen today.  He started on 5 mg Lexapro .  Patient do not remember seeing this Probation officer .  There has been no reports the patient has been complaining of side effects of medication.  The patient continued to express passive suicidal thoughts however no plan.  He continues to worry about his future and his physical illness.  There has been some concern that patient has been refusing medication however when I ask the patient he did not remember refusing any treatment.  Patient continues to have memory impairment and confusion.  Patient denies any hallucination or any paranoia.  He is not aggressive or violent.   Past Psychiatric History: Past Medical History  Diagnosis Date  . Hypertension   . Second degree AV block   . Syncope   . Pacemaker -Medtronic     DOI 2012    reports that he has been smoking Cigarettes.  He has a 8.75 pack-year smoking history. He does not have any smokeless tobacco history on file. He reports that he does not drink alcohol or use illicit drugs. Family History  Problem Relation Age of Onset  . Cancer Mother   . Heart disease Father   . Heart disease Sister   . Heart disease Brother      Living Arrangements: Non-relatives/Friends   Abuse/Neglect [facility]) Physical Abuse: Denies Verbal Abuse: Denies Sexual Abuse: Denies Allergies:  No Known  Allergies  ACT Assessment Complete:  No:   Past Psychiatric History: Diagnosis:  None  Hospitalizations:  None  Outpatient Care:  None  Substance Abuse Care:  Denies  Self-Mutilation:  Denies  Suicidal Attempts:  Denies  Homicidal Behaviors:  Denies   Violent Behaviors:  Denies   Place of Residence:  Lives with GF Marital Status:  single Employed/Unemployed:  unemployed Education:  unknown Family Supports:  limited Objective: Blood pressure 102/68, pulse 64, temperature 98.9 F (37.2 C), temperature source Oral, resp. rate 18, height 5' 8.11" (1.73 m), weight 118 lb 6.2 oz (53.7 kg), SpO2 98.00%.Body mass index is 17.94 kg/(m^2). Results for orders placed during the hospital encounter of 06/07/13 (from the past 72 hour(s))  CBC WITH DIFFERENTIAL     Status: Abnormal   Collection Time    06/15/13  3:00 PM      Result Value Ref Range   WBC 12.5 (*) 4.0 - 10.5 K/uL   RBC 3.75 (*) 4.22 - 5.81 MIL/uL   Hemoglobin 11.5 (*) 13.0 - 17.0 g/dL   HCT 35.4 (*) 39.0 - 52.0 %   MCV 94.4  78.0 - 100.0 fL   MCH 30.7  26.0 - 34.0 pg   MCHC 32.5  30.0 - 36.0 g/dL   RDW 12.6  11.5 - 15.5 %   Platelets 255  150 - 400 K/uL   Neutrophils Relative % 70  43 - 77 %  Neutro Abs 8.7 (*) 1.7 - 7.7 K/uL   Lymphocytes Relative 8 (*) 12 - 46 %   Lymphs Abs 1.0  0.7 - 4.0 K/uL   Monocytes Relative 8  3 - 12 %   Monocytes Absolute 1.0  0.1 - 1.0 K/uL   Eosinophils Relative 14 (*) 0 - 5 %   Eosinophils Absolute 1.8 (*) 0.0 - 0.7 K/uL   Basophils Relative 0  0 - 1 %   Basophils Absolute 0.0  0.0 - 0.1 K/uL  COMPREHENSIVE METABOLIC PANEL     Status: Abnormal   Collection Time    06/15/13  3:00 PM      Result Value Ref Range   Sodium 138  137 - 147 mEq/L   Potassium 4.6  3.7 - 5.3 mEq/L   Chloride 97  96 - 112 mEq/L   CO2 29  19 - 32 mEq/L   Glucose, Bld 96  70 - 99 mg/dL   BUN 10  6 - 23 mg/dL   Creatinine, Ser 0.78  0.50 - 1.35 mg/dL   Calcium 8.8  8.4 - 10.5 mg/dL   Total Protein 7.3  6.0 -  8.3 g/dL   Albumin 2.6 (*) 3.5 - 5.2 g/dL   AST 40 (*) 0 - 37 U/L   ALT 70 (*) 0 - 53 U/L   Alkaline Phosphatase 106  39 - 117 U/L   Total Bilirubin 0.2 (*) 0.3 - 1.2 mg/dL   GFR calc non Af Amer >90  >90 mL/min   GFR calc Af Amer >90  >90 mL/min   Comment: (NOTE)     The eGFR has been calculated using the CKD EPI equation.     This calculation has not been validated in all clinical situations.     eGFR's persistently <90 mL/min signify possible Chronic Kidney     Disease.  GLUCOSE, CAPILLARY     Status: Abnormal   Collection Time    06/15/13  5:38 PM      Result Value Ref Range   Glucose-Capillary 138 (*) 70 - 99 mg/dL   Labs are reviewed.  Current Facility-Administered Medications  Medication Dose Route Frequency Provider Last Rate Last Dose  . aspirin chewable tablet 81 mg  81 mg Oral Daily Andrena Mews, MD   81 mg at 06/16/13 1103  . atenolol (TENORMIN) tablet 25 mg  25 mg Oral Daily Cordelia Poche, MD   25 mg at 06/16/13 1103  . [START ON 06/17/2013] elvitegravir-cobicistat-emtricitabine-tenofovir (STRIBILD) 150-150-200-300 MG tablet 1 tablet  1 tablet Oral Q breakfast Truman Hayward, MD      . escitalopram (LEXAPRO) tablet 5 mg  5 mg Oral QHS Langston Masker, MD   5 mg at 06/15/13 2150  . feeding supplement (ENSURE COMPLETE) (ENSURE COMPLETE) liquid 237 mL  237 mL Oral TID Erlene Quan, RD   237 mL at 06/16/13 1604  . heparin injection 5,000 Units  5,000 Units Subcutaneous 3 times per day Langston Masker, MD   5,000 Units at 06/16/13 1351  . penicillin G potassium 4 Million Units in dextrose 5 % 250 mL IVPB  4 Million Units Intravenous Q4H Truman Hayward, MD   4 Million Units at 06/16/13 1350  . sodium chloride 0.9 % injection 3 mL  3 mL Intravenous Q12H Langston Masker, MD   3 mL at 06/16/13 1000   Mental status examination Patient remains confused and depressed.  He admitted passive suicidal thoughts but no plan.  He  denies any hallucination or any paranoia.  His  thought process remains very slow and at times loose.  His attention and concentration is poor.  He has difficulty remembering things.  There is no anger or agitation.  Treatment Plan Summary: Continue Lexapro 5 mg daily since patient is not complaining of side effects.  At this time patient does not have the capacity to participate in his treatment plan.  C/L service follow up, if patient remains in the hospital. Please call [phone] if you have any question.   Arlyce Harman [place] 06/16/2013 5:43 PM

## 2013-06-16 NOTE — Progress Notes (Signed)
I met with pt at bedside with his safety sitter. I then contacted his girlfriend, Inez Catalina, by phone to discuss inpt rehab admission as a possibility for his recovery prior to d/c home with her in approximately 2 weeks. She expresses her wishes for inpt rehab then home with her. Noted psychiatry consult. I will discuss with Dr. Naaman Plummer, follow up with Inez Catalina at bedside on Monday, and finalize dispo then. I have discussed with Dr. Skeet Simmer. [phone]

## 2013-06-16 NOTE — Progress Notes (Signed)
Received a call from [place] that patient's telemetry showed 6 runs of V Tach, notified MD on call, MD states to monitor patient, patient asymptomatic. Will continue to monitor.

## 2013-06-16 NOTE — Progress Notes (Signed)
Utilization review completed.  

## 2013-06-16 NOTE — Progress Notes (Signed)
FMTS Attending Daily Note:  Jeff Radek Carnero MD  [phone] pager  Family Practice pager:  [phone] I have discussed this patient with the resident Dr. Marsh.  I agree with their findings, assessment, and care plan  

## 2013-06-17 DIAGNOSIS — B20 Human immunodeficiency virus [HIV] disease: Secondary | ICD-10-CM | POA: Diagnosis not present

## 2013-06-17 DIAGNOSIS — A523 Neurosyphilis, unspecified: Secondary | ICD-10-CM | POA: Diagnosis not present

## 2013-06-17 DIAGNOSIS — A879 Viral meningitis, unspecified: Secondary | ICD-10-CM | POA: Diagnosis not present

## 2013-06-17 DIAGNOSIS — R269 Unspecified abnormalities of gait and mobility: Secondary | ICD-10-CM | POA: Diagnosis not present

## 2013-06-17 NOTE — Progress Notes (Signed)
FMTS Attending Daily Note:  Annabell Sabal MD  [phone] pager  Family Practice pager:  [phone] I have seen and examined this patient and have reviewed their chart. I have discussed this patient with the resident. I agree with the resident's findings, assessment and care plan.   [name], MD 06/17/2013 11:05 AM

## 2013-06-17 NOTE — Progress Notes (Signed)
Family Medicine Teaching Service Daily Progress Note Intern Pager: [phone]  Patient name: Shaun Lutz Medical record number: [id number] Date of birth: 1955-04-07 Age: 58 y.o. Gender: male  Primary Care Provider: Donnamae Jude, MD Consultants: ID, neuro Code Status: full  Pt Overview and Major Events to Date: Shaun Lutz is a 58 y.o. 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 Lucianne Lei Dam's note 4/11: soiled himself overnight, awoke confused, so Zoloft discontinued; otherwise little change 4/13: severe panic attacks, perseverative, ativan 20m 4/14: HIV med assistance and HIV genomic studies 4/15: CM/SW and PT/OT to eval regarding placement 4/16: 1:1 for passive suicidal ideation, discussion re placement, consideration of CIR 4/17: formal CIR evaluation, Psych to assess capacity, started ARV therapy  Assessment and Plan: Shaun Lutz a 58y.o. 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  -CSF TB now negative, started on ARV 4/17 -f/up repeat LP results   - CSF culture / studies (see  results review)  - TB by PCR (NOT DETECTED)  - HSV by PCR (NEGATIVE)  - fungal with smear (negative smear, Cx pending)  - AFB with smear (negative smear, Cx pending)  - viral culture (prelim neg to date)  - cytology (no malig cells) -ID following greatly appreciate recs and management -covering with high-dose Pen-G IV for 2 weeks duration(started 4/9--) Day 10 today; will also need 3 additional doses of weekly PCN -HIV genomic studies in process:   - HIV RNA quant approx 60,000; CD4 250  - HIV integrade genotype/genotype plus in process   - HLA B*5701  #Neuro - Gait abnormality, hearing loss, CNS infection, cogwheel rigidity  DDx includes sequelae of HIV/syphilis vs parkinsonian features vs demyelinating d/o.Likely patient having features related to progression of infectious disease (see above).  -work up as above with ID -neuro signed off, plan to f/up as outpt -PT/OT evals both recommending CIR  #CV- HTN/second degree AV block s/p pacemaker placement 2012.  2D echo 4/10 grossly normal, Borderline hypotensive this morning, cont to monitor on tele - hold lisinopril  - cont atenolol, pacer interrogated; consider formal cards consult if needed - cont ASA   #Pulm- COPD? Bullous changes seen on CT chest with bilat infiltrates -no prescriptions for COPD currently  -vitals per floor protocol, will cont to monitor with pulse ox   #Psych/Social Suicidal ideation- in light of recent diagnosis, occasional passive thoughts no active plan; did not respond to zoloft, started lexapro  - 1:1 for now but will continually reassess  - cont lexapro 557m- appreciate psych assistance, patient lacks capacity  #FEN/GI: heart healthy diet, saline lock IV; replete electrolytes prn -hold PO if patient anxious  Disposition: finalized ID recs for completion  of course; CM/CIR for insurance approval  Subjective:  Reports doing well this morning. He is without complaints. Notes the benadryl helped  significantly for his itching over night.  Objective: Temp:  [97.9 F (36.6 C)-99.6 F (37.6 C)] 99.6 F (37.6 C) (04/18 0437) Pulse Rate:  [64-73] 72 (04/18 0437) Resp:  [16-18] 16 (04/18 0437) BP: (93-118)/(52-76) 95/60 mmHg (04/18 0437) SpO2:  [96 %-99 %] 96 % (04/18 0437) Physical Exam: Gen: NAD; cooperative with exam this morning  CV: RRR without definite murmur  Resp: CTABL, no wheezes, non-labored  Abd: thin, scaphoid, SNTND, BS present, no guarding or organomegaly  Ext: thin extremities, warm Skin: vitiligo, multiple hyperpigmented macules throughout entire body including palms and soles; scattered excoriations without evidence for skin superinfection  Laboratory: utox neg  U/a neg apart from 2.0 protein  CSF gram stain no organism  Crypto ag neg  See results review and A/P for extensive CSF study results  Imaging/Diagnostic Tests: CT head 06/07/13 IMPRESSION:  1. Hypoattenuation in the left temporal tip appears parenchymal and  are likely related to ischemia of and an extra-axial arachnoid cyst.  2. Diffuse subcortical white matter hypoattenuation bilaterally is  stable. 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  3. Mild prominence the ventricles bilaterally is likely due to  atrophy. Mild hydrocephalus is considered less likely. Lumbar  puncture may be useful for further evaluation.   CT cervical spine 06/08/13 IMPRESSION:  1. No significant para meningeal focus of infection is identified.  2. Multilevel degenerative cervical spine disease as described.  3. Stable vertebral body heights and alignment.  4. Hypodense lesion within the right parotid gland measures 10 x 15  mm. This could represent a hyperdense lymph node associated with  HIV. However, no other significant adenopathy is present. A primary  parotid lesion is also considered.  5. Inguinal lymph nodes bilaterally are likely related to HIV.  6. Bilateral pleural effusions. Infection is not excluded.  7.  Mass like area dependently in the left lower lobe likely  represents rounded atelectasis or potentially some fluid trapped  within the fissure.  8. 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   Shaun Haven, MD 06/17/2013, 7:15 AM PGY-2, [place] Intern pager: [phone], text pages welcome

## 2013-06-17 NOTE — Progress Notes (Signed)
Family Medicine Teaching Service Daily Progress Note Intern Pager: [phone]  Patient name: Shaun Lutz Medical record number: [id number] Date of birth: Jun 11, 1955 Age: 58 y.o. Gender: male  Primary Care Provider: Donnamae Jude, MD Consultants: ID, neuro Code Status: full  Pt Overview and Major Events to Date: Shaun Lutz is a 58 y.o. 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 Lucianne Lei Dam's note 4/11: soiled himself overnight, awoke confused, so Zoloft discontinued; otherwise little change 4/13: severe panic attacks, perseverative, ativan 18m 4/14: HIV med assistance and HIV genomic studies 4/15: CM/SW and PT/OT to eval regarding placement 4/16: 1:1 for passive suicidal ideation, discussion re placement, consideration of CIR 4/17: formal CIR evaluation, Psych to assess capacity, started ARV therapy  Assessment and Plan: Shaun Lutz a 58y.o. 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  -CSF TB now negative, started on ARV 4/17 (STRIBILD) -ID following greatly appreciate recs and  management -covering with high-dose Pen-G IV for 2 weeks duration(started 4/9--) Day 11 today; will also need 3 additional doses of weekly PCN -f/up titers  -HIV genomic studies in process:   - HIV RNA quant approx 60,000; CD4 250  - HIV integrade genotype/genotype plus in process   - HLA B*5701  #Neuro - Gait abnormality, hearing loss, waxing/waning dementia DDx includes sequelae of HIV/syphilis vs parkinsonian features vs demyelinating d/o.Likely patient having features related to progression of infectious disease (see above).  -work up as above with ID -neuro signed off, plan to f/up as outpt -PT/OT evals both recommending CIR, will likely need placement in SNF after  #CV- HTN/second degree AV block s/p pacemaker placement 2012.  2D echo 4/10 grossly normal, Borderline hypotensive this morning, cont to monitor on tele - hold lisinopril  - cont atenolol, pacer interrogated; consider formal cards consult if needed - cont ASA   #Pulm- COPD? Bullous changes seen on CT chest with bilat infiltrates -no prescriptions for COPD currently  -vitals per floor protocol, will cont to monitor with pulse ox   #Psych/Social Suicidal ideation, lacks capacity - 1:1 sitter  - cont lexapro 580m- appreciate psych assistance  #FEN/GI: heart healthy diet, saline lock IV; replete electrolytes prn -hold PO if patient anxious  Disposition: finalized ID recs for completion of course; CM/CIR for insurance approval  Subjective:  Reports doing well this morning. Ate candy from home; was happy that his family came to see him yesterday  Objective: Temp:  [99 F (37.2 C)-99.6 F (37.6 C)] 99 F (37.2 C) (04/18 2100) Pulse Rate:  [68-77] 72 (04/18 2100) Resp:  [16-18] 16 (04/18 2100) BP: (95-122)/(60-84) 122/84 mmHg (04/18 2100) SpO2:  [96 %-98 %] 98 % (04/18 2100) Weight:  [1[812  lb 9.5 oz (54.7 kg)] 120 lb 9.5 oz (54.7 kg) (04/18 2100) Physical Exam: Gen: NAD; cooperative with exam this morning  CV:  RRR without definite murmur  Resp: CTABL, no wheezes, non-labored  Abd: thin, scaphoid, SNTND, BS present, no guarding or organomegaly  Ext: thin extremities, warm Skin: vitiligo, multiple hyperpigmented macules throughout entire body including palms and soles; scattered excoriations without evidence for skin superinfection  Laboratory: utox neg  U/a neg apart from 2.0 protein  CSF gram stain no organism  Crypto ag neg  See results review and A/P for extensive CSF study results  Imaging/Diagnostic Tests: CT head 06/07/13 IMPRESSION:  1. Hypoattenuation in the left temporal tip appears parenchymal and  are likely related to ischemia of and an extra-axial arachnoid cyst.  2. Diffuse subcortical white matter hypoattenuation bilaterally is  stable. 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  3. Mild prominence the ventricles bilaterally is likely due to  atrophy. Mild hydrocephalus is considered less likely. Lumbar  puncture may be useful for further evaluation.   CT cervical spine 06/08/13 IMPRESSION:  1. No significant para meningeal focus of infection is identified.  2. Multilevel degenerative cervical spine disease as described.  3. Stable vertebral body heights and alignment.  4. Hypodense lesion within the right parotid gland measures 10 x 15  mm. This could represent a hyperdense lymph node associated with  HIV. However, no other significant adenopathy is present. A primary  parotid lesion is also considered.  5. Inguinal lymph nodes bilaterally are likely related to HIV.  6. Bilateral pleural effusions. Infection is not excluded.  7. Mass like area dependently in the left lower lobe likely  represents rounded atelectasis or potentially some fluid trapped  within the fissure.  8. 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   Langston Masker, MD 06/17/2013, 11:09 PM PGY-1, [place] Intern pager: [phone], text pages welcome

## 2013-06-18 DIAGNOSIS — A523 Neurosyphilis, unspecified: Secondary | ICD-10-CM | POA: Diagnosis not present

## 2013-06-18 DIAGNOSIS — R269 Unspecified abnormalities of gait and mobility: Secondary | ICD-10-CM | POA: Diagnosis not present

## 2013-06-18 DIAGNOSIS — B20 Human immunodeficiency virus [HIV] disease: Secondary | ICD-10-CM | POA: Diagnosis not present

## 2013-06-18 MED ORDER — DIPHENHYDRAMINE HCL 25 MG PO CAPS
25.0000 mg | ORAL_CAPSULE | Freq: Four times a day (QID) | ORAL | Status: DC
  Administered 2013-06-18 – 2013-06-20 (×8): 25 mg via ORAL
  Filled 2013-06-18 (×16): qty 1

## 2013-06-18 NOTE — Progress Notes (Signed)
FMTS Attending Daily Note:  Annabell Sabal MD  [phone] pager  Family Practice pager:  [phone] I have seen and examined this patient and have reviewed their chart. I have discussed this patient with the resident. I agree with the resident's findings, assessment and care plan.  Additionally:  - Only thing new are erythematous urticarial/papular patches scattered across chest, face, and all over back.  Patient scratching when I entered room.  - may be now allergic to PCN? - recommend hypersensitization if so.  Benadryl for now.  Continue to monitor to ensure not worsening and no airway involvement.    [name], MD 06/18/2013 12:30 PM

## 2013-06-19 DIAGNOSIS — R269 Unspecified abnormalities of gait and mobility: Secondary | ICD-10-CM | POA: Diagnosis not present

## 2013-06-19 DIAGNOSIS — Z21 Asymptomatic human immunodeficiency virus [HIV] infection status: Secondary | ICD-10-CM

## 2013-06-19 DIAGNOSIS — R29898 Other symptoms and signs involving the musculoskeletal system: Secondary | ICD-10-CM | POA: Diagnosis not present

## 2013-06-19 DIAGNOSIS — B20 Human immunodeficiency virus [HIV] disease: Secondary | ICD-10-CM | POA: Diagnosis not present

## 2013-06-19 DIAGNOSIS — A523 Neurosyphilis, unspecified: Secondary | ICD-10-CM | POA: Diagnosis not present

## 2013-06-19 DIAGNOSIS — R21 Rash and other nonspecific skin eruption: Secondary | ICD-10-CM

## 2013-06-19 DIAGNOSIS — E440 Moderate protein-calorie malnutrition: Secondary | ICD-10-CM

## 2013-06-19 LAB — COMPREHENSIVE METABOLIC PANEL
ALT: 49 U/L (ref 0–53)
AST: 35 U/L (ref 0–37)
Albumin: 2.4 g/dL — ABNORMAL LOW (ref 3.5–5.2)
Alkaline Phosphatase: 86 U/L (ref 39–117)
BUN: 14 mg/dL (ref 6–23)
CALCIUM: 8.6 mg/dL (ref 8.4–10.5)
CHLORIDE: 89 meq/L — AB (ref 96–112)
CO2: 25 meq/L (ref 19–32)
CREATININE: 0.86 mg/dL (ref 0.50–1.35)
GLUCOSE: 117 mg/dL — AB (ref 70–99)
Potassium: 5.4 mEq/L — ABNORMAL HIGH (ref 3.7–5.3)
Sodium: 126 mEq/L — ABNORMAL LOW (ref 137–147)
Total Bilirubin: 0.3 mg/dL (ref 0.3–1.2)
Total Protein: 7.5 g/dL (ref 6.0–8.3)

## 2013-06-19 LAB — CBC WITH DIFFERENTIAL/PLATELET
BASOS ABS: 0 10*3/uL (ref 0.0–0.1)
Basophils Relative: 0 % (ref 0–1)
Eosinophils Absolute: 1 10*3/uL — ABNORMAL HIGH (ref 0.0–0.7)
Eosinophils Relative: 8 % — ABNORMAL HIGH (ref 0–5)
HEMATOCRIT: 35.9 % — AB (ref 39.0–52.0)
HEMOGLOBIN: 12 g/dL — AB (ref 13.0–17.0)
LYMPHS PCT: 5 % — AB (ref 12–46)
Lymphs Abs: 0.6 10*3/uL — ABNORMAL LOW (ref 0.7–4.0)
MCH: 30.8 pg (ref 26.0–34.0)
MCHC: 33.4 g/dL (ref 30.0–36.0)
MCV: 92.3 fL (ref 78.0–100.0)
MONOS PCT: 3 % (ref 3–12)
Monocytes Absolute: 0.4 10*3/uL (ref 0.1–1.0)
NEUTROS ABS: 10.2 10*3/uL — AB (ref 1.7–7.7)
Neutrophils Relative %: 84 % — ABNORMAL HIGH (ref 43–77)
Platelets: 294 10*3/uL (ref 150–400)
RBC: 3.89 MIL/uL — AB (ref 4.22–5.81)
RDW: 12.8 % (ref 11.5–15.5)
WBC: 12.2 10*3/uL — AB (ref 4.0–10.5)

## 2013-06-19 LAB — GLUCOSE, CAPILLARY
Glucose-Capillary: 245 mg/dL — ABNORMAL HIGH (ref 70–99)
Glucose-Capillary: 173 mg/dL — ABNORMAL HIGH (ref 70–99)

## 2013-06-19 LAB — VIRAL CULTURE VIRC

## 2013-06-19 LAB — BASIC METABOLIC PANEL
BUN: 16 mg/dL (ref 6–23)
CO2: 22 mEq/L (ref 19–32)
Calcium: 9 mg/dL (ref 8.4–10.5)
Chloride: 89 mEq/L — ABNORMAL LOW (ref 96–112)
Creatinine, Ser: 0.78 mg/dL (ref 0.50–1.35)
Glucose, Bld: 296 mg/dL — ABNORMAL HIGH (ref 70–99)
Potassium: 5 mEq/L (ref 3.7–5.3)
SODIUM: 129 meq/L — AB (ref 137–147)

## 2013-06-19 LAB — MRSA PCR SCREENING: MRSA by PCR: NEGATIVE

## 2013-06-19 MED ORDER — METHYLPREDNISOLONE SODIUM SUCC 125 MG IJ SOLR
125.0000 mg | Freq: Four times a day (QID) | INTRAMUSCULAR | Status: DC
  Administered 2013-06-19 – 2013-06-20 (×6): 125 mg via INTRAVENOUS
  Filled 2013-06-19 (×10): qty 2

## 2013-06-19 MED ORDER — PENICILLIN G POTASSIUM 5000000 UNITS IJ SOLR
4.0000 10*6.[IU] | INTRAVENOUS | Status: DC
  Filled 2013-06-19 (×4): qty 4

## 2013-06-19 MED ORDER — DEXTROSE 5 % IV SOLN
2.0000 g | INTRAVENOUS | Status: DC
  Administered 2013-06-20: 2 g via INTRAVENOUS
  Filled 2013-06-19 (×2): qty 2

## 2013-06-19 MED ORDER — METHYLPREDNISOLONE SODIUM SUCC 125 MG IJ SOLR: 125.0000 mg | Freq: Once | INTRAMUSCULAR | Status: DC

## 2013-06-19 MED ORDER — DEXTROSE 5 % IV SOLN
2.0000 g | Freq: Two times a day (BID) | INTRAVENOUS | Status: DC
  Administered 2013-06-19: 2 g via INTRAVENOUS
  Filled 2013-06-19 (×2): qty 2

## 2013-06-19 MED ORDER — ELVITEG-COBIC-EMTRICIT-TENOFDF 150-150-200-300 MG PO TABS
1.0000 | ORAL_TABLET | Freq: Every day | ORAL | Status: DC
  Filled 2013-06-19 (×2): qty 1

## 2013-06-19 MED ORDER — INSULIN ASPART 100 UNIT/ML ~~LOC~~ SOLN
0.0000 [IU] | Freq: Three times a day (TID) | SUBCUTANEOUS | Status: DC
  Administered 2013-06-19 – 2013-06-20 (×2): 3 [IU] via SUBCUTANEOUS
  Administered 2013-06-20: 1 [IU] via SUBCUTANEOUS
  Administered 2013-06-20: 3 [IU] via SUBCUTANEOUS

## 2013-06-19 MED ORDER — SODIUM CHLORIDE 0.9 % IV SOLN
INTRAVENOUS | Status: DC
  Administered 2013-06-19: 11:00:00 via INTRAVENOUS
  Administered 2013-06-20: 1 mL via INTRAVENOUS
  Administered 2013-06-20: 17:00:00 via INTRAVENOUS

## 2013-06-19 MED ORDER — INSULIN ASPART 100 UNIT/ML ~~LOC~~ SOLN
0.0000 [IU] | Freq: Every day | SUBCUTANEOUS | Status: DC
  Administered 2013-06-20: 2 [IU] via SUBCUTANEOUS

## 2013-06-19 NOTE — Progress Notes (Signed)
[facility]    11 days of  of full 24 MU of IV PCN 1 day (today of ceftriaxone)  Subjective:  Apparently he developed worsening urticarial rash overnight and ulcer on buccal mucosa. I thought he already had rash last week that he was scratching to point of bleeding when I saw him on Friday. He does have more exfoliation of skin on face now and ulcer that is clear in left buccal mucosa. He was given IV corticosteroids overnight and transferred to the step down unit. His peni than on Friday illin was held this morning and on their recommendation he was changed to ceftriaxone and his STRIBILD was held  Antibiotics:  Anti-infectives   Start     Dose/Rate Route Frequency Ordered Stop   06/20/13 1000  cefTRIAXone (ROCEPHIN) 2 g in dextrose 5 % 50 mL IVPB     2 g 100 mL/hr over 30 Minutes Intravenous Every 24 hours 06/19/13 1146     06/19/13 1000  penicillin G potassium 4 Million Units in dextrose 5 % 250 mL IVPB  Status:  Discontinued     4 Million Units 250 mL/hr over 60 Minutes Intravenous Every 4 hours 06/19/13 0503 06/19/13 0807   06/19/13 1000  elvitegravir-cobicistat-emtricitabine-tenofovir (STRIBILD) 150-150-200-300 MG tablet 1 tablet  Status:  Discontinued     1 tablet Oral Daily with breakfast 06/19/13 0503 06/19/13 0807   06/19/13 1000  cefTRIAXone (ROCEPHIN) 2 g in dextrose 5 % 50 mL IVPB  Status:  Discontinued     2 g 100 mL/hr over 30 Minutes Intravenous Every 12 hours 06/19/13 0807 06/19/13 1146   06/17/13 0800  elvitegravir-cobicistat-emtricitabine-tenofovir (STRIBILD) 150-150-200-300 MG tablet 1 tablet  Status:  Discontinued     1 tablet Oral Daily with breakfast 06/16/13 1429 06/19/13 0503   06/08/13 0200  penicillin G potassium 4 Million Units in dextrose 5 % 250 mL IVPB  Status:  Discontinued     4 Million Units 250 mL/hr over 60 Minutes Intravenous Every 4 hours 06/08/13 0033 06/19/13 0503   06/07/13 2200  cefTRIAXone (ROCEPHIN) 2 g in  dextrose 5 % 50 mL IVPB  Status:  Discontinued     2 g 100 mL/hr over 30 Minutes Intravenous Every 12 hours 06/07/13 1847 06/07/13 1852   06/07/13 2200  cefTRIAXone (ROCEPHIN) 2 g in dextrose 5 % 50 mL IVPB  Status:  Discontinued    Comments:  Please draw blood cultures before antibiotics   2 g 100 mL/hr over 30 Minutes Intravenous Every 12 hours 06/07/13 1852 06/08/13 1459   06/07/13 2200  acyclovir (ZOVIRAX) 525 mg in dextrose 5 % 100 mL IVPB  Status:  Discontinued     10 mg/kg  52.5 kg 110.5 mL/hr over 60 Minutes Intravenous 3 times per day 06/07/13 1915 06/09/13 1041   06/07/13 2000  vancomycin (VANCOCIN) IVPB 750 mg/150 ml premix  Status:  Discontinued     750 mg 150 mL/hr over 60 Minutes Intravenous Every 12 hours 06/07/13 1915 06/08/13 1459   06/07/13 2000  Ampicillin-Sulbactam (UNASYN) 3 g in sodium chloride 0.9 % 100 mL IVPB  Status:  Discontinued     3 g 100 mL/hr over 60 Minutes Intravenous Every 6 hours 06/07/13 1915 06/08/13 0033      Medications: Scheduled Meds: . aspirin  81 mg Oral Daily  . atenolol  25 mg Oral Daily  . [START ON 06/20/2013] cefTRIAXone (ROCEPHIN)  IV  2 g Intravenous Q24H  .  diphenhydrAMINE  25 mg Oral 4 times per day  . escitalopram  5 mg Oral QHS  . feeding supplement (ENSURE COMPLETE)  237 mL Oral TID  . heparin  5,000 Units Subcutaneous 3 times per day  . insulin aspart  0-5 Units Subcutaneous QHS  . insulin aspart  0-9 Units Subcutaneous TID WC  . methylPREDNISolone (SOLU-MEDROL) injection  125 mg Intravenous Q6H  . sodium chloride  3 mL Intravenous Q12H   Continuous Infusions: . sodium chloride 75 mL/hr at 06/19/13 1051   PRN Meds:.    Objective: Weight change: -14.1 oz (-0.4 kg)  Intake/Output Summary (Last 24 hours) at 06/19/13 1716 Last data filed at 06/19/13 1600  Gross per 24 hour  Intake   1880 ml  Output   1650 ml  Net    230 ml   Blood pressure 97/67, pulse 85, temperature 97.5 F (36.4 C), temperature source Oral,  resp. rate 22, height 5' 8.11" (1.73 m), weight 119 lb 11.4 oz (54.3 kg), SpO2 97.00%. Temp:  [97.5 F (36.4 C)-99.3 F (37.4 C)] 97.5 F (36.4 C) (04/20 1600) Pulse Rate:  [70-102] 85 (04/20 1600) Resp:  [18-32] 22 (04/20 1600) BP: (79-121)/(52-92) 97/67 mmHg (04/20 1600) SpO2:  [94 %-100 %] 97 % (04/20 1600) Weight:  [119 lb 11.4 oz (54.3 kg)] 119 lb 11.4 oz (54.3 kg) (04/19 2100)  Physical Exam: AO person but not situation HEENT: anicteric sclera,  CVS regular rate, normal r, no murmur rubs or gallops  Chest: clear to auscultation bilaterally, no wheezing, rales or rhonchi  Abdomen: soft nontender, nondistended, normal bowel sounds,   Skin: hyperpigmented lesions seen below and pictures   Rash on Friday with extensive areas of excoriation 06/16/13:    Rash on back today: 06/19/13 actually looks better vs Friday though he is sp steroids    More exfoliation on face than seen on Friday     Ulcerative lesion on buccal mucosa with horrible dentition      CBC:  Recent Labs Lab 06/15/13 1500 06/19/13 0626  HGB 11.5* 12.0*  HCT 35.4* 35.9*  PLT 255 294     BMET  Recent Labs  06/19/13 0626 06/19/13 1316  NA 126* 129*  K 5.4* 5.0  CL 89* 89*  CO2 25 22  GLUCOSE 117* 296*  BUN 14 16  CREATININE 0.86 0.78  CALCIUM 8.6 9.0     Liver Panel   Recent Labs  06/19/13 0626  PROT 7.5  ALBUMIN 2.4*  AST 35  ALT 49  ALKPHOS 86  BILITOT 0.3       Sedimentation Rate No results found for this basename: ESRSEDRATE,  in the last 72 hours C-Reactive Protein No results found for this basename: CRP,  in the last 72 hours  Micro Results: Recent Results (from the past 240 hour(s))  CLOSTRIDIUM DIFFICILE BY PCR     Status: None   Collection Time    06/11/13  6:05 PM      Result Value Ref Range Status   C difficile by pcr NEGATIVE  NEGATIVE Final  MRSA PCR SCREENING     Status: None   Collection Time    06/19/13  5:15 AM      Result Value Ref Range  Status   MRSA by PCR NEGATIVE  NEGATIVE Final   Comment:            The GeneXpert MRSA Assay (FDA     approved for NASAL specimens     only), is  one component of a     comprehensive MRSA colonization     surveillance program. It is not     intended to diagnose MRSA     infection nor to guide or     monitor treatment for     MRSA infections.    Studies/Results: No results found.    Assessment/Plan:  Active Problems:   ATRIOVENTRICULAR BLOCK, MOBITZ TYPE II   Pacemaker- medtronic   Abnormality of gait   Human immunodeficiency virus (HIV) disease   Lower extremity weakness   Syphilis   Malnutrition of moderate degree    Shaun Lutz is a 58 y.o. 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 Now with apparent worsening of rash concerning for PCN allergy overnight sp IV corticosteroids in transfer to step down unit change some IV penicillin ceftriaxone   #1 Neurosyphilis:  With todays dose of ceftriaxone he has now had 12 days of IV therapy for Neurosyphilis (and guidelines indicate should treate for 10-14 days)  If he can tolerate ceftriaxone further can push for two more doses  Certainly if any evidence of further ADR would stop the Beta lactam since he will have completed full course of therapy  WOULD NOT try to desensitize him in this setting  He will need followup serial RPR and likely repeat LP with VDRL wbc, cell count and diff, protein and glucose per CDC guidelines (none of that would be during this admissioN)  GIVEn concerne for PCN induced rash would not give him post IV , IM PCN anymore  #2 Rash: complicated pt who already had pruritic rash last week, prior vitiligo. Not crystal clear that this is PCN rash but it is worrisome enough that I agree stopping PCN (despite it and Ceph being only effective rx for Neurosyphilis)> As above we should have a VERY low threshold to stop even the  ceftriaxone. 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 He is NOT competent and this was confirmed by psychiatry. He will likely need placment long term as I dont think he will be cabpable of caring for himself  #4 Depression and SI: Perhaps best NOT to continually bring up his diagnoses since he apparently forgets yet again, Hopefully there is some improvement in his cognition he is on SSRI       LOS: 12 days   Truman Hayward 06/19/2013, 5:16 PM

## 2013-06-19 NOTE — Progress Notes (Addendum)
Interim Note S: Called to the bedside by RN to evaluate patient 2/2 urticaria? Rapid development of skin rash On arrival pt able to speak in full sentences, sating well on RA; no current complaints apart from mild itching O: BP 112/71  Pulse 83  Temp(Src) 99.3 F (37.4 C) (Oral)  Resp 18  Ht 5' 8.11" (1.73 m)  Wt 119 lb 11.4 oz (54.3 kg)  BMI 18.14 kg/m2  SpO2 98% Skin: multiple urticarial like patches scattered across upper and lower extremities; confluence of rash on R arm and face which is also swollen; areas of skin desquamation on buttocks that are now bleeding Mucosa: 39mm ulceration in right cheek; no mucosal swelling A/P: Likely urticaria 2/2 high dose PCN vs scalded skin vs SJS; interestingly pt with development following initiation of ARV on 4/17  -cont benadryl for now -addition of solumedrol 125 -transfer to SDU -monitor closely for signs of resp compromise -hold am PCN dose -touch base with ID immediately in am  Bernadene Bell, MD Family Medicine PGY-1 Please page or call with questions

## 2013-06-19 NOTE — Progress Notes (Signed)
Family Medicine Teaching Service Daily Progress Note Intern Pager: [phone]  Patient name: Shaun Lutz Medical record number: [id number] Date of birth: 1955/07/09 Age: 58 y.o. Gender: male  Primary Care Provider: Donnamae Jude, MD Consultants: ID, neuro Code Status: full  Pt Overview and Major Events to Date: Shaun Lutz is a 58 y.o. 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 Lucianne Lei Dam's note 4/11: soiled himself overnight, awoke confused, so Zoloft discontinued; otherwise little change 4/13: severe panic attacks, perseverative, ativan 4m 4/14: HIV med assistance and HIV genomic studies 4/15: CM/SW and PT/OT to eval regarding placement 4/16: 1:1 for passive suicidal ideation, discussion re placement, consideration of CIR 4/17: formal CIR evaluation, Psych to assess capacity, started ARV therapy 4/19: pt developed worsening urticaria, involving face, transferred to SDU placed on IV solumedrol 4/20: consulted ID for eval; d/c ARV/PCNG, start high dose CTX  Assessment and Plan: Shaun Lutz a 58y.o. 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 PMH is significant for HTN, second degree AV block s/p pacemaker placement, vitiligo, recent dx HIV/syphilis  # ID- HIV positive, Syphilis positive; question meningitis  Likely Neurosyphilis (with reactive RPR and T.pallidum Ab >8, +VDRL in CSF 1:128) with possible super imposed  bacterial vs fungal infection vs demyelinating disease vs malignancy; now with possible delayed hypersensitivity reaction involving face, upper extremities -Started on ARV 4/17 (STRIBILD), will hold for now in light of above -ID following greatly appreciate recs and management -today would have been day 12 of high dose Pen-G IV(started 4/9--4/19), will hold today and start high dose CTX 2gmq12; could consider sensitization to PCN if pt react to CTX as well -continue IV solumedrol q6 -scheduled benadryl -monitor closely for development of mucosal involvement -vitals per floor to eval for signs of resp compromise -HIV genomic studies in process:   - HIV RNA quant approx 60,000; CD4 250  - HIV integrade genotype/genotype plus in process   - HLA B*5701- post  #Neuro - Gait abnormality, hearing loss, waxing/waning dementia DDx includes sequelae of HIV/syphilis vs parkinsonian features vs demyelinating d/o.Likely patient having features related to progression of infectious disease (see above).  -work up as above with ID -neuro signed off, plan to f/up as outpt -PT/OT evals both recommending CIR, will likely need placement in SNF after  #CV- HTN/second degree AV block s/p pacemaker placement 2012.  2D echo 4/10 grossly normal, Borderline hypotensive this morning, cont to monitor on tele - hold lisinopril  - cont atenolol, pacer interrogated; consider formal cards consult if needed - cont ASA   #Pulm- COPD? Bullous changes seen on CT chest with bilat infiltrates; will monitor for signs of airway changes with possible sensitivity to PCNG -no prescriptions for COPD currently  -vitals per floor protocol, will cont to monitor with pulse ox   #Psych/Social Suicidal ideation, lacks capacity - 1:1 sitter  - cont lexapro 529m- appreciate psych assistance  #FEN/GI: heart healthy diet, saline lock IV; replete electrolytes prn -hold PO if patient anxious  Disposition:further ID recs  re-antibiotics/ARV, likely will not be able to go to  CIR any time soon  Subjective:  Breathing comfortably, not reporting any itching Objective: Temp:  [98.5 F (36.9 C)-100 F (37.8 C)] 98.8 F (37.1 C) (04/19 2100) Pulse Rate:  [68-90] 90 (04/19 2100) Resp:  [16-18] 18 (04/19 2100) BP: (99-121)/(68-78) 121/78 mmHg (04/19 2100) SpO2:  [95 %-98 %] 98 % (04/19 2100) Weight:  [119 lb 11.4 oz (54.3 kg)] 119 lb 11.4 oz (54.3 kg) (04/19 2100) Physical Exam: Gen: NAD; cooperative with exam this morning  HEENT: poor dentition, shallow ulceration on right buccal mucosa CV: RRR without definite murmur  Resp: CTABL, no wheezes, non-labored, moving air Abd: thin, scaphoid, SNTND, BS present, no guarding or organomegaly  Ext: thin extremities, warm Skin: vitiligo, multiple hyperpigmented macules throughout entire body including palms and soles; scattered excoriations, multiple urticarial like patches scattered across upper and lower extremities; confluence of rash on R arm and face which is also swollen; areas of skin desquamation on buttocks that are now bleeding  Laboratory: utox neg  U/a neg apart from 2.0 protein  CSF gram stain no organism  Crypto ag neg  See results review and A/P for extensive CSF study results  Imaging/Diagnostic Tests: CT head 06/07/13 IMPRESSION:  1. Hypoattenuation in the left temporal tip appears parenchymal and  are likely related to ischemia of and an extra-axial arachnoid cyst.  2. Diffuse subcortical white matter hypoattenuation bilaterally is  stable. 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  3. Mild prominence the ventricles bilaterally is likely due to  atrophy. Mild hydrocephalus is considered less likely. Lumbar  puncture may be useful for further evaluation.   CT cervical spine 06/08/13 IMPRESSION:  1. No significant para meningeal focus of infection is identified.  2. Multilevel degenerative cervical spine disease as described.  3. Stable vertebral  body heights and alignment.  4. Hypodense lesion within the right parotid gland measures 10 x 15  mm. This could represent a hyperdense lymph node associated with  HIV. However, no other significant adenopathy is present. A primary  parotid lesion is also considered.  5. Inguinal lymph nodes bilaterally are likely related to HIV.  6. Bilateral pleural effusions. Infection is not excluded.  7. Mass like area dependently in the left lower lobe likely  represents rounded atelectasis or potentially some fluid trapped  within the fissure.  8. 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   Langston Masker, MD 06/19/2013, 12:07 AM PGY-1, [place] Intern pager: [phone], text pages welcome

## 2013-06-19 NOTE — Progress Notes (Signed)
Family Medicine Teaching Service Daily Progress Note Intern Pager: [phone]  Patient name: Shaun Lutz Medical record number: [id number] Date of birth: [birth year] Age: 58 y.o. Gender: male  Primary Care Provider: Donnamae Jude, MD Consultants: ID, neuro Code Status: full  Pt Overview and Major Events to Date: Shaun Lutz is a 58 y.o. 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 Lucianne Lei Dam's note 4/11: soiled himself overnight, awoke confused, so Zoloft discontinued; otherwise little change 4/13: severe panic attacks, perseverative, ativan 1mg  4/14: HIV med assistance and HIV genomic studies 4/15: CM/SW and PT/OT to eval regarding placement 4/16: 1:1 for passive suicidal ideation, discussion re placement, consideration of CIR 4/17: formal CIR evaluation, Psych to assess capacity, started ARV therapy 4/19: pt developed worsening urticaria, involving face, transferred to SDU placed on IV solumedrol 4/20: consulted ID for eval; d/c ARV/PCNG, start high dose CTX 4/21: may start new ARV; skin breakdown extensive, seems to be tolerating CTX  Assessment and Plan: Shaun Lutz is a 58 y.o. 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 PMH is significant for HTN, second degree AV block s/p pacemaker placement, vitiligo, recent dx HIV/syphilis  #Skin- Urticarial rash/excoriations and possible drug reaction; vitiligo -monitor closely for need  to stop CTX -cont barrier cream -switch IV steroids to PO prednisone 50mg  -switch benadryl to loratadine  -transfer out of SDU   # ID- HIV positive, Neurosyphilis  Now with possible delayed hypersensitivity reaction involving face, upper extremities (see above) -Started on ARV 4/17-4/19 (STRIBILD); plan to start TIVICAY and TRUVADA this morning -PenGIV (4/9-4/19) d/c'd 2/2 above, started high dose CTX (4/20-) hopeful to complete 14 day course for neurosyph -ID following greatly appreciate recs and management -monitor closely for development of mucosal involvement -vitals per floor to eval for signs of resp compromise  #Neuro - Gait abnormality, hearing loss, waxing/waning dementia DDx includes sequelae of HIV/syphilis vs parkinsonian features vs demyelinating d/o.Likely patient having features related to progression of infectious disease (see above).  -work up as above with ID -neuro signed off, plan to f/up as outpt -PT/OT evals both recommending CIR,working towards home with [facility] services following   #CV- HTN/second degree AV block s/p pacemaker placement 2012.  2D echo 4/10 grossly normal, Borderline hypotensive again  - cont atenolol, pacer interrogated; consider formal cards consult if needed - cont ASA   #Pulm- COPD? Bullous changes seen on CT chest with bilat infiltrates; will monitor for signs of airway changes with possible sensitivity to PCNG -no prescriptions for COPD currently  -vitals per floor protocol, will cont to monitor with pulse ox   #Psych/Social Suicidal ideation, lacks capacity, no longer meeting criteria for 1:1 sitter, nursing feels comfortable without - cont lexapro 5mg  - appreciate psych assistance  #FEN/GI/Endo: hyponatremia improving with IVF; heart healthy diet, elevated CBGs with solumedrol plan to d/c and PO steroids -cont SSI  -replete electrolytes prn -hold PO if patient anxious  Disposition:further ID recs re-antibiotics/ starting new ARV, working  towards dispo for CIR  Subjective:  Breathing comfortably, not reporting any itching Objective: Temp:  [97.5 F (36.4 C)-99.3 F (37.4 C)] 97.5 F (36.4 C) (04/20 2000) Pulse Rate:  [73-102] 78 (04/20 2000) Resp:  [15-32] 17 (04/20 2000) BP: (79-117)/(52-94) 100/85 mmHg (04/20 2000) SpO2:  [94 %-100 %] 98 % (04/20 2000) Physical Exam: Gen: NAD; cooperative with exam this morning,lying quietly in bed   HEENT: poor dentition, shallow ulceration on right buccal mucosa improved CV: RRR without definite murmur  Resp: CTABL, no wheezes, non-labored, moving air Abd: thin, scaphoid, SNTND, BS present, no guarding or organomegaly  Ext: thin extremities, warm Skin: vitiligo, multiple hyperpigmented macules throughout entire body including palms and soles; scattered excoriations, multiple urticarial like patches scattered across upper and lower extremities, skin sloughing and break down worse in the buttocks region   Laboratory: utox neg  U/a neg apart from 2.0 protein  CSF gram stain no organism  Crypto ag neg  See results review and A/P for extensive CSF study results  Imaging/Diagnostic Tests: CT head 06/07/13 IMPRESSION:  1. Hypoattenuation in the left temporal tip appears parenchymal and  are likely related to ischemia of and an extra-axial arachnoid cyst.  2. Diffuse subcortical white matter hypoattenuation bilaterally is  stable. 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  3. Mild prominence the ventricles bilaterally is likely due to  atrophy. Mild hydrocephalus is considered less likely. Lumbar  puncture may be useful for further evaluation.   CT cervical spine 06/08/13 IMPRESSION:  1. No significant para meningeal focus of infection is identified.  2. Multilevel degenerative cervical spine disease as described.  3. Stable vertebral body heights and alignment.  4. Hypodense lesion within the right parotid gland measures 10 x 15  mm. This could represent a hyperdense lymph node  associated with  HIV. However, no other significant adenopathy is present. A primary  parotid lesion is also considered.  5. Inguinal lymph nodes bilaterally are likely related to HIV.  6. Bilateral pleural effusions. Infection is not excluded.  7. Mass like area dependently in the left lower lobe likely  represents rounded atelectasis or potentially some fluid trapped  within the fissure.  8. 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   Langston Masker, MD 06/19/2013, 11:21 PM PGY-1, [place] Intern pager: [phone], text pages welcome

## 2013-06-19 NOTE — Progress Notes (Signed)
Physical Therapy Treatment Patient Details Name: Shaun Lutz MRN: [id number] DOB: 08/23/1955 Today's Date: 06/19/2013    History of Present Illness Shaun Lutz is a 58 y.o. male with  with COPD, Pacemaker (medtronic 2012) frequent falls gait abnormality found to have HIV and syphilis with a high titer of 1-512, status post CT imaging of the brain a lumbar puncture with high white count extremely low glucose and very high protein.4/20 moved to ICU due to sudden onset of rash and unknown if would progress to respiratory comprimise    PT Comments    Pt is progressing towards functional goals. Pt demonstrated R UE tremors during all functional mobility activities. Pt experienced full body tremors and Bilat knee buckling (req max A) during amb putting him at high risk for falls. Pt to benefit from CIR to maximize independence for safe transition home. Pt to benefit from skilled acute PT to address limitations listed below.    Follow Up Recommendations  Supervision/Assistance - 24 hour;CIR     Equipment Recommendations  Rolling walker with 5" wheels    Recommendations for Other Services       Precautions / Restrictions Precautions Precautions: Fall Precaution Comments: body tremors during functional mobility  Restrictions Weight Bearing Restrictions: No    Mobility  Bed Mobility Overal bed mobility: Needs Assistance Bed Mobility: Rolling;Sidelying to Sit;Sit to Sidelying Rolling: Min guard Sidelying to sit: Min assist     Sit to sidelying: Min guard General bed mobility comments: verbal cues for hand placement and sequencing to use bed rails. min A to move LEs EOB. pt req min A for trunk initiation and control for sidelying to sit.  Transfers Overall transfer level: Needs assistance Equipment used: Rolling walker (2 wheeled) Transfers: Sit to/from Stand Sit to Stand: Min assist         General transfer comment: cues for hand placement, assist for safety due to intermittent  tremors and initially with decreased safety awareness  Ambulation/Gait Ambulation/Gait assistance: Min assist;Max assist Ambulation Distance (Feet): 200 Feet Assistive device: Rolling walker (2 wheeled) Gait Pattern/deviations: Step-through pattern;Decreased stride length;Trunk flexed;Shuffle Gait velocity: slow; guarded for falls   General Gait Details: pt amb 2 x 100 ft. pt's Bilat knees tremored and buckled and Bilat UEs tremored after 100 ft req max A to prevent fall and lower to seat. pt demo'd R UE tremor throughout the whole amb activity req hand held A to keep RW managed safely. pt amb with flexed knees unable to achieve approopriate knee extension despite verbal cues. min A throughout amb for pt support.    Stairs            Wheelchair Mobility    Modified Rankin (Stroke Patients Only)       Balance Overall balance assessment: Needs assistance Sitting-balance support: Feet supported;No upper extremity supported Sitting balance-Leahy Scale: Fair Sitting balance - Comments: pt initally req Bilat UE support but later was able to maintain proper sitting posture with only feet supported at supervision level   Standing balance support: Bilateral upper extremity supported Standing balance-Leahy Scale: Poor Standing balance comment: posterior bias. pt unable to maintain full knee extension. pt req Bilat UE support. pt's R UE tremored req min A to steady RW. pt req min A for physical support in standing. tactile cues encouraged upright posture. pt stood for 5 min straight to allow BP and to urinate in urinal (after catheter fell out in standing)  Cognition Arousal/Alertness: Awake/alert Behavior During Therapy: WFL for tasks assessed/performed Overall Cognitive Status: Impaired/Different from baseline                 General Comments: pt able to recognize having to urinate. catheter fell off while standing; pt required standing position to use  urinal    Exercises      General Comments General comments (skin integrity, edema, etc.): pt's condom catheter fell off in standing. RN aware. pt stood to use urinal. pt's skin is dry. pt able to achieve Bilat full knee PROM extension. BP supine 106/76. unable to get accurate BP reading in other posisions due to UE tremors.      Pertinent Vitals/Pain Pt reports no pain. BP supine: 106/76 Unable to obtain accurate BP in other positions due to UE tremors.     Home Living                      Prior Function            PT Goals (current goals can now be found in the care plan section) Acute Rehab PT Goals PT Goal Formulation: With patient Time For Goal Achievement: 06/28/13 Potential to Achieve Goals: Good Progress towards PT goals: Progressing toward goals    Frequency  Min 3X/week    PT Plan Current plan remains appropriate    Co-evaluation             End of Session Equipment Utilized During Treatment: Gait belt (RW) Activity Tolerance: Patient limited by fatigue Patient left: in bed;with call bell/phone within reach;with nursing/sitter in room     Time: 5809-9833 PT Time Calculation (min): 33 min  Charges:                       G Codes:      Shaun Lutz SPT 2013/06/21, 4:36 PM

## 2013-06-19 NOTE — Progress Notes (Signed)
I met with pt's girlfriend, Shaun Lutz, at bedside. She is interested in pt receiving his rehab at inpt rehab , not SNF. Noted medical issues over over the weekend. We will follow and await medical readiness to admit pt to inpt rehab over the next several days. [phone]

## 2013-06-19 NOTE — Progress Notes (Signed)
Called Anderson, significant other, to make aware that patient was transferred to [facility].  She understands and will be at the hospital this AM.  Earleen Reaper RN-BC, WTA

## 2013-06-19 NOTE — Progress Notes (Signed)
Occupational Therapy Treatment Patient Details Name: Shaun Lutz MRN: [id number] DOB: [birth year] Today's Date: 06/19/2013    History of present illness Shaun Lutz is a 58 y.o. male with  with COPD, Pacemaker (medtronic 2012) frequent falls gait abnormality found to have HIV and syphilis with a high titer of 1-512, status post CT imaging of the brain a lumbar puncture with high white count extremely low glucose and very high protein.4/20 moved to ICU due to sudden onset of rash and unknown if would progress to respiratory comprimise   OT comments  This 58 yo presents at same level as last session for activities addressed. Tremors and knees buckling are obstacles. Will benefit from continued OT on acute and CIR.  Follow Up Recommendations  CIR;Supervision/Assistance - 24 hour    Equipment Recommendations   (TBD next venue)       Precautions / Restrictions Precautions Precautions: Fall Precaution Comments: intermittent body shaking tremors; HIV; contact Restrictions Weight Bearing Restrictions: No       Mobility Bed Mobility Overal bed mobility: Needs Assistance Bed Mobility: Rolling;Sidelying to Sit Rolling: Min guard Sidelying to sit: Min assist          Transfers Overall transfer level: Needs assistance Equipment used: 1 person hand held assist Transfers: Sit to/from Stand Sit to Stand: Min assist              Balance Overall balance assessment: Needs assistance;History of Falls Sitting-balance support: Feet supported;No upper extremity supported Sitting balance-Leahy Scale: Fair     Standing balance support: During functional activity Standing balance-Leahy Scale: Poor Standing balance comment: tremors and both knees started to buckle the longer he stood at the sink, once he sat RUE continued to tremor                   ADL       Grooming: Wash/dry face;Set up;Standing (with mod A for balance)                   Toilet Transfer: Minimal  assistance (recliner next to bed)   Toileting- Clothing Manipulation and Hygiene: Total assistance;Sit to/from stand                          Cognition   Behavior During Therapy: [facility] for tasks assessed/performed Overall Cognitive Status: Impaired/Different from baseline                  General Comments: Had had a bowel movement but was unaware until we mentioned it to him, we were getting items ready to help clean him up and he said, "Oh, I need to go to the bathroom"--bedpan placed and he did indeed go more.                 Pertinent Vitals/ Pain       No c/o pain and vitals stable         Frequency Min 2X/week     Progress Toward Goals  OT Goals(current goals can now be found in the care plan section)  Progress towards OT goals: Not progressing toward goals - comment (same as last session)     Plan Discharge plan remains appropriate       End of Session Equipment Utilized During Treatment: Gait belt   Activity Tolerance  (limited by tremors and knees buckling)   Patient Left in chair;with call bell/phone within reach;with nursing/sitter in room;with family/visitor present  Time: 4782-9562 OT Time Calculation (min): 28 min  Charges: OT General Charges $OT Visit: 1 Procedure OT Treatments $Self Care/Home Management : 23-37 mins  Almon Register  [phone]  06/19/2013, 12:11 PM

## 2013-06-19 NOTE — Progress Notes (Signed)
Seen and examined.  Agree with Dr. Burt Ek documentation and management.  Briefly, he has had a setback with an allergic reaction, unclear which of many drugs but most likely PCN.  With great help from our ID partners, we have switched antibiotic coverage.  Still needs rehab.  Plan remains unchanged.

## 2013-06-19 NOTE — Progress Notes (Signed)
Physical Therapy Treatment Patient Details Name: Shaun Lutz MRN: [id number] DOB: Nov 07, 1955 Today's Date: 06/19/2013    History of Present Illness Shaun Lutz is a 58 y.o. male with  with COPD, Pacemaker (medtronic 2012) frequent falls gait abnormality found to have HIV and syphilis with a high titer of 1-512, status post CT imaging of the brain a lumbar puncture with high white count extremely low glucose and very high protein.4/20 moved to ICU due to sudden onset of rash and unknown if would progress to respiratory comprimise    PT Comments    Pt is progressing towards functional goals. Pt demonstrated R UE tremors during all functional mobility activities. Pt experienced full body tremors and Bilat knee buckling (req max A) during amb putting him at high risk for falls. Pt to benefit from CIR to maximize independence for safe transition home. Pt to benefit from skilled acute PT to address limitations listed below.    Follow Up Recommendations  Supervision/Assistance - 24 hour;CIR     Equipment Recommendations  Rolling walker with 5" wheels    Recommendations for Other Services       Precautions / Restrictions Precautions Precautions: Fall Precaution Comments: body tremors during functional mobility  Restrictions Weight Bearing Restrictions: No    Mobility  Bed Mobility Overal bed mobility: Needs Assistance Bed Mobility: Rolling;Sidelying to Sit;Sit to Sidelying Rolling: Min guard Sidelying to sit: Min assist     Sit to sidelying: Min guard General bed mobility comments: verbal cues for hand placement and sequencing to use bed rails. min A to move LEs EOB. pt req min A for trunk initiation and control for sidelying to sit.  Transfers Overall transfer level: Needs assistance Equipment used: Rolling walker (2 wheeled) Transfers: Sit to/from Stand Sit to Stand: Min assist         General transfer comment: cues for hand placement, assist for safety due to intermittent  tremors and initially with decreased safety awareness  Ambulation/Gait Ambulation/Gait assistance: Min assist;Max assist Ambulation Distance (Feet): 200 Feet Assistive device: Rolling walker (2 wheeled) Gait Pattern/deviations: Step-through pattern;Decreased stride length;Trunk flexed;Shuffle Gait velocity: slow; guarded for falls   General Gait Details: pt amb 2 x 100 ft. pt's Bilat knees tremored and buckled and Bilat UEs tremored after 100 ft req max A to prevent fall and lower to seat. pt demo'd R UE tremor throughout the whole amb activity req hand held A to keep RW managed safely. pt amb with flexed knees unable to achieve approopriate knee extension despite verbal cues. min A throughout amb for pt support.    Stairs            Wheelchair Mobility    Modified Rankin (Stroke Patients Only)       Balance Overall balance assessment: Needs assistance Sitting-balance support: Feet supported;No upper extremity supported Sitting balance-Leahy Scale: Fair Sitting balance - Comments: pt initally req Bilat UE support but later was able to maintain proper sitting posture with only feet supported at supervision level   Standing balance support: Bilateral upper extremity supported Standing balance-Leahy Scale: Poor Standing balance comment: posterior bias. pt unable to maintain full knee extension. pt req Bilat UE support. pt's R UE tremored req min A to steady RW. pt req min A for physical support in standing. tactile cues encouraged upright posture. pt stood for 5 min straight to allow BP and to urinate in urinal (after catheter fell out in standing)  Cognition Arousal/Alertness: Awake/alert Behavior During Therapy: WFL for tasks assessed/performed Overall Cognitive Status: Impaired/Different from baseline Area of Impairment: Safety/judgement;Following commands       Following Commands: Follows one step commands with increased time;Follows one step  commands inconsistently Safety/Judgement: Decreased awareness of safety     General Comments: pt able to recognize having to urinate. catheter fell off while standing; pt required standing position to use urinal    Exercises      General Comments General comments (skin integrity, edema, etc.): pt's condom catheter fell off in standing. RN aware. pt stood to use urinal. pt's skin is dry. pt able to achieve Bilat full knee PROM extension. BP supine 106/76. unable to get accurate BP reading in other posisions due to UE tremors.      Pertinent Vitals/Pain Pt reports no pain. BP supine: 106/76 Unable to obtain accurate BP in other positions due to UE tremors.     Home Living                      Prior Function            PT Goals (current goals can now be found in the care plan section) Acute Rehab PT Goals PT Goal Formulation: With patient Time For Goal Achievement: 06/28/13 Potential to Achieve Goals: Good Progress towards PT goals: Progressing toward goals    Frequency  Min 3X/week    PT Plan Current plan remains appropriate    Co-evaluation             End of Session Equipment Utilized During Treatment: Gait belt (RW) Activity Tolerance: Patient limited by fatigue Patient left: in bed;with call bell/phone within reach;with nursing/sitter in room     Time: 0175-1025 PT Time Calculation (min): 33 min  Charges:  $Gait Training: 8-22 mins $Therapeutic Activity: 8-22 mins                    G Codes:      Manley Mason SPT 06/19/2013, 4:59 PM  Agree with above assessment.  Kittie Plater, PT, DPT Pager #: [phone] Office #: [phone]

## 2013-06-19 NOTE — PMR Pre-admission (Signed)
PMR Admission Coordinator Pre-Admission Assessment  Patient: Shaun Lutz is an 58 y.o., male MRN: [id number] DOB: September 13, 1955 Height: 5' 8.11" (173 cm) Weight: 54.3 kg (119 lb 11.4 oz)              Insurance Information HMO:     PPO:      PCP:      IPA:      80/20: yes     OTHER: no HMO PRIMARY: Medicare a and b      Policy#: [id number]      Subscriber: pt Benefits:  Phone #: [email or web address] online     Name: 06/19/13  Eff. Date: 11/30/2012     Deduct: $1260      Out of Pocket Max: none      Life Max: none CIR: 100%      SNF: 20 full days Outpatient: 80%     Co-Pay: 20% Home Health: 100%      Co-Pay: none DME: 80%     Co-Pay: 20% Providers: pt choice  SECONDARY: none        Medicaid Application Date: tba      Case Manager:  Pt gets his meds from [place]. Inez Catalina is unaware of any Medicaid  Emergency Contact Information Contact Information   Name Relation Home Work Mobile   Donnell,Betty Significant other [phone]       Current Medical History  Patient Admitting Diagnosis: Neurosyphilis, newly diagnosed HIV  History of Present Illness:Shaun Lutz is a 58 y.o. right handed male history of hypertension, COPD, heart block status post pacemaker. Patient lives with his girlfriend.   Admitted 06/07/2013 with progressive gait disorder as well as hand tremors with recent fall. Patient tested positive HIV as well as syphilis with RPR of 1:512. Lumbar puncture showed white blood cell count of 86 with 60% segmented neutrophils 35% lymphocytes. CSF protein greater than 600 and glucose of 7. Gram stain negative for any organisms and cryptococcal antigen on CSF was negative. 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 Infectious disease consulted plan treatment of 14 days of high-dose penicillin and 3 doses of weekly PCN. Started on ARV with workup ongoing. Subcutaneous heparin for DVT prophylaxis.   ON  4/20 psychiatry agreed that pt has passive thought of suicide and does not currently require a sitter and it is to be discontinued.  Dr. Tommy Medal, Infectious Disease, reconsulted about antibiotic plan since pt developed a rash, concerning for penicillin sensitivity. Antibiotics switched to ceftriaxone and antiviral, Stribild, discontinued. New antiviral to be started. HIV genomic studies in process. HIV RNA quant approx 60,000; CD4 250. On 4/20 pt on IV solumedrol q 6hrs.  Past Medical History  Past Medical History  Diagnosis Date  . Hypertension   . Second degree AV block   . Syncope   . Pacemaker -Medtronic     DOI 2012    Family History  family history includes Cancer in his mother; Heart disease in his brother, father, and sister.  Prior Rehab/Hospitalizations: none  Current Medications  Current facility-administered medications:0.9 %  sodium chloride infusion, , Intravenous, Continuous, Langston Masker, MD, Last Rate: 75 mL/hr at 06/20/13 1728;  aspirin chewable tablet 81 mg, 81 mg, Oral, Daily, Andrena Mews, MD, 81 mg at 06/21/13 1032;  atenolol (TENORMIN) tablet 25 mg, 25 mg, Oral, Daily, Cordelia Poche, MD, 25 mg at 06/21/13 1032 cefTRIAXone (ROCEPHIN) 2 g in dextrose 5 % 50 mL  IVPB, 2 g, Intravenous, Q24H, Truman Hayward, MD, 2 g at 06/20/13 1324;  dolutegravir (TIVICAY) tablet 50 mg, 50 mg, Oral, Daily, Truman Hayward, MD, 50 mg at 06/21/13 1032;  emtricitabine-tenofovir (TRUVADA) 200-300 MG per tablet 1 tablet, 1 tablet, Oral, Daily, Truman Hayward, MD, 1 tablet at 06/21/13 1032 escitalopram (LEXAPRO) tablet 5 mg, 5 mg, Oral, QHS, Langston Masker, MD, 5 mg at 06/20/13 2157;  feeding supplement (ENSURE COMPLETE) (ENSURE COMPLETE) liquid 237 mL, 237 mL, Oral, TID, Erlene Quan, RD, 237 mL at 06/21/13 1320;  haloperidol (HALDOL) tablet 0.5 mg, 0.5 mg, Oral, QHS, Cordelia Poche, MD;  heparin injection 5,000 Units, 5,000 Units, Subcutaneous, 3 times per day, Langston Masker,  MD, 5,000 Units at 06/21/13 4010 insulin aspart (novoLOG) injection 0-5 Units, 0-5 Units, Subcutaneous, QHS, [name], MD, 2 Units at 06/20/13 2159;  insulin aspart (novoLOG) injection 0-9 Units, 0-9 Units, Subcutaneous, TID WC, [name], MD, 3 Units at 06/20/13 1728;  loratadine (CLARITIN) tablet 10 mg, 10 mg, Oral, Daily, Cordelia Poche, MD, 10 mg at 06/21/13 1032 [START ON 06/22/2013] predniSONE (DELTASONE) tablet 20 mg, 20 mg, Oral, Q breakfast, Langston Masker, MD;  sodium chloride 0.9 % injection 3 mL, 3 mL, Intravenous, Q12H, Langston Masker, MD, 3 mL at 06/21/13 1000  Patients Current Diet: Regular diet  Precautions / Restrictions Precautions Precautions: Fall Precaution Comments: body tremors during functional mobility  Restrictions Weight Bearing Restrictions: No   Prior Activity Level Limited Community (1-2x/wk): little community outings except md office, bank, etc Inez Catalina states pt has become progressively weaker over last several few months. Tremors with up activities. Slow gait. Does not use AD. Has not fallen.Disabled due to heart issues. Did his own bathing and dressing.  Did not drive. [place] [facility] pta  Home Assistive Devices / Equipment Home Assistive Devices/Equipment: None Home Equipment: None  Prior Functional Level Prior Function Level of Independence: Independent Comments: states girlfriend is disabled and has an aide that does cleaning, cooking, etc  Current Functional Level Cognition  Overall Cognitive Status: Impaired/Different from baseline Orientation Level: Oriented to person;Oriented to place;Disoriented to time;Disoriented to situation Following Commands: Follows one step commands with increased time;Follows one step commands inconsistently Safety/Judgement: Decreased awareness of safety General Comments: pt able to recognize having to urinate. catheter fell off while standing; pt required standing position to use urinal    Extremity  Assessment (includes Sensation/Coordination)          ADLs  Overall ADL's : Needs assistance/impaired Grooming: Wash/dry face;Set up;Standing (with mod A for balance) Grooming Details (indicate cue type and reason): mod A for balane/support standing at sink Upper Body Bathing: Set up;Min guard;Sitting Lower Body Bathing: Moderate assistance;Sit to/from stand Upper Body Dressing : Sitting;Set up;Min guard Lower Body Dressing: Minimal assistance;Sitting/lateral leans (to donn socks at EOB) Toilet Transfer: Minimal assistance (recliner next to bed) Toilet Transfer Details (indicate cue type and reason): temors increased when initiating sit - stand. Able to stand at sink x 5 minutes for grooming tasks wiht mod A for balance/support Toileting- Clothing Manipulation and Hygiene: Total assistance;Sit to/from stand Functional mobility during ADLs: Minimal assistance;Moderate assistance;+2 for physical assistance General ADL Comments: tremors, decreased coordination/balance. Pt stood at sink x 5 minutes for grooming tasks before LOB, then seated on 3 in 1 to complete tasks    Mobility  Overal bed mobility: Needs Assistance Bed Mobility: Supine to Sit Rolling: Min guard Sidelying to sit: Min assist;HOB elevated Supine to sit:  Supervision;HOB elevated Sit to supine: Supervision;HOB elevated Sit to sidelying: Min guard General bed mobility comments: cues for hand placement and sequencing; pt required min (A) to elevate trunk to sitting position at EOB; pt requires incr time; termors throughout     Transfers  Overall transfer level: Needs assistance Equipment used: Rolling walker (2 wheeled) Transfers: Sit to/from Stand Sit to Stand: Mod assist Stand pivot transfers: Mod assist;+2 physical assistance General transfer comment: pt with posterior lean while standing; LEs bracing on bed; mod (A) to maintain balance; cues for hand placement and sequencing     Ambulation / Gait / Stairs /  Wheelchair Mobility  Ambulation/Gait Ambulation/Gait assistance: Mod assist Ambulation Distance (Feet): 150 Feet Assistive device: Rolling walker (2 wheeled) Gait Pattern/deviations: Step-through pattern;Shuffle;Decreased stride length;Trunk flexed;Narrow base of support;Drifts right/left Gait velocity: slow; guarded for falls Gait velocity interpretation: Below normal speed for age/gender General Gait Details: pt able to ambulate increased distance without rest break today; 2 person (A) helpful for safety; pt with LE tremors and buckling inconsistently; cues for sequencing     Posture / Balance Dynamic Sitting Balance Sitting balance - Comments: UE supported; tolerated sitting 3 min     Special needs/care consideration Skin Skin with hyperpigmented lesions. Rash appeared last week with extensive areas of excoriation. Placed on IV steroids, now po 4/22. Exfoliation on face and back. Bowel mgmt: incontinent Bladder mgmt:continent Diabetic mgmt no   Previous Home Environment Living Arrangements: Non-relatives/Friends Available Help at Discharge: Other (Comment) (girlfriend) Type of Home: House Home Layout: One level Home Access: Stairs to enter Entrance Stairs-Rails: Right Entrance Stairs-Number of Steps: 4 Bathroom Shower/Tub: Chiropodist: Standard Home Care Services: Yes Type of Home Care Services: Home PT  Discharge Living Setting Plans for Discharge Living Setting: Patient's home;Lives with (comment) (lives with girlfriend of 16 years) Type of Home at Discharge: House Discharge Home Layout: One level Discharge Home Access: Stairs to enter Entrance Stairs-Rails: Right Entrance Stairs-Number of Steps: 4 Discharge Bathroom Shower/Tub: [place] unit Discharge Bathroom Toilet: Standard Discharge Bathroom Accessibility: Yes How Accessible: Accessible via walker Does the patient have any problems obtaining your medications?: No Inez Catalina states she always takes  care of family who are unable to help themselves and that Daren has also always taken care of family members.  Social/Family/Support Systems Patient Roles: Partner;Other (Comment) (estranged from his adult children) Contact Information: Fredrik Cove, girlfriend of 42 years Anticipated Caregiver: Inez Catalina Anticipated Caregiver's Contact Information: see above Ability/Limitations of Caregiver: can provide min assist level Caregiver Availability: 24/7 Discharge Plan Discussed with Primary Caregiver: Yes Is Caregiver In Agreement with Plan?: Yes Does Caregiver/Family have Issues with Lodging/Transportation while Pt is in Rehab?: No Inez Catalina likes to stay with pt in hospital. Does not drive) Inez Catalina, designated POA but has not have papers.  Pt has been told about his diagnosis 06/07/13 by [facility] as well as girlfriend. Dr. Tommy Medal has also discussed with pt at bedside, but pt does not remember dx from one discussion to the next with same MD.(day to day) Sister is unaware of dx.  Goals/Additional Needs Patient/Family Goal for Rehab: supervision with PT, OT, and SLP Expected length of stay: ELOS 14 to 18 days Equipment Needs: New 042 diagnosis. Pt confused and states he has infection on he brain Special Service Needs: suicide Tour manager. Psychiatry states lacks the capacity to make own decisions Additional Information: Girlfriend is aware of diagnosis and plans to get herself tested. Pt's sisters visit but are unaware of diagnosis  Pt/Family Agrees to Admission and willing to participate: Yes Program Orientation Provided & Reviewed with Pt/Caregiver Including Roles  & Responsibilities: Yes Additional Information Needs: pt with new diagnosis. Inez Catalina has herself a PCS aide that does cleaning for her 5 days per week after she had knee surgery. They do not assist with bathing her, or cooking. Inez Catalina walks with a limp   Decrease burden of Care through IP rehab admission: n/a  Possible need  for SNF placement upon discharge:I discussed with Inez Catalina, girlfriend, and she does not want SNF.Marland Kitchen She states she and pt have always assisted other family members during illnesses and she plans to do the same for him. SHe will let helathcare providers know if she gets to a point that she can not manage.  Patient Condition: This patient's medical and functional status has changed since the consult dated: 06/15/13 in which the Rehabilitation Physician determined and documented that the patient's condition is appropriate for intensive rehabilitative care in an inpatient rehabilitation facility. See "History of Present Illness" (above) for medical update. Functional changes are: overall mod assist. Patient's medical and functional status update has been discussed with the Rehabilitation physician and patient remains appropriate for inpatient rehabilitation. Will admit to inpatient rehab today.  Preadmission Screen Completed By:  Cleatrice Burke, 06/21/2013 1:45 PM ______________________________________________________________________   Discussed status with Dr. Naaman Plummer on 06/21/13 at  1345 and received telephone approval for admission today.  Admission Coordinator:  Cleatrice Burke, time 4917 Date 06/21/13.

## 2013-06-19 NOTE — Progress Notes (Addendum)
UR completed.  Kaedon Fanelli, RN BSN MHA CCM Trauma/Neuro ICU Case Manager [phone]  

## 2013-06-19 NOTE — Progress Notes (Signed)
Interim Progress Note  Spoke with Dr. Sandria Manly, Psychiatry, about patient's "sitter" status. Agreed that patient has passive thoughts of suicide and does not currently require a sitter. Recommended patient's sitter order be discontinued, which it has.  Also spoke with Dr. Tommy Medal, Infectious Disease, about antibiotic plan since patient appears to have developed a rash, concerning for penicillin sensitivity. Patient's antibiotics switched to ceftriaxone and antiviral, Stribild, discontinued. Discussed that he will start a different antiviral. Recommended patient continue to be watched, at least for the next day, for recurrence of reaction.  Cordelia Poche, MD PGY-1, [facility] 06/19/2013, 4:10 PM Service Pager: [phone]

## 2013-06-19 NOTE — Progress Notes (Addendum)
NUTRITION FOLLOW-UP  DOCUMENTATION CODES Per approved criteria  -Non-severe (moderate) malnutrition in the context of chronic illness -Underweight   Pt meets criteria for moderate MALNUTRITION in the context of chronic illness as evidenced by moderate muscle and fat mass loss.  INTERVENTION: Continue Ensure Complete po TID, each supplement provides 350 kcal and 13 grams of protein. Recommend assistance with meals, if no family present to assist.   NUTRITION DIAGNOSIS: Increased nutrient needs related to HIV as evidenced by estimated needs. Ongoing.  Goal: Intake to meet >90% of estimated nutrition needs. met.  Monitor:  weight trends, lab trends, I/O's, PO intake, supplement tolerance  ASSESSMENT: Admitted with gait abnormalities. Work-up reveals syphilis, HIV positive, possible meningitis.  LP on 4/8, repeated on 4/10. Meningitis studies pending. Pt developed worsening urticaria involoving face. Pt transferred and placed on IV solumedrol. Pt with suicide sitter at bedside, per aide pt ate 80% of his Breakfast this am. Pt likes ensure and per significant other pt is drinking all three ensures per day. Encouraged pt to continue good intake. Per notes pt to go to rehab when stable.  Potassium high.  Height: Ht Readings from Last 1 Encounters:  06/13/13 5' 8.11" (1.73 m)    Weight: Wt Readings from Last 1 Encounters:  06/18/13 119 lb 11.4 oz (54.3 kg)  Admit wt 115 lb  BMI:  Body mass index is 18.14 kg/(m^2). Underweight  Estimated Nutritional Needs: Kcal: 1550 - 1800 kcal Protein: 60 - 75 g Fluid: 1.6 - 1.8 liters  Skin: multiple scratch marks left buttocks  Diet Order: Dysphagia 3; Heart Healthy Meal Completion: 35-75%   Intake/Output Summary (Last 24 hours) at 06/19/13 0953 Last data filed at 06/19/13 0840  Gross per 24 hour  Intake   1500 ml  Output    600 ml  Net    900 ml    Last BM: 4/20  Labs:   Recent Labs Lab 06/15/13 1500 06/19/13 0626   NA 138 126*  K 4.6 5.4*  CL 97 89*  CO2 29 25  BUN 10 14  CREATININE 0.78 0.86  CALCIUM 8.8 8.6  GLUCOSE 96 117*    CBG (last 3)  No results found for this basename: GLUCAP,  in the last 72 hours Lab Results  Component Value Date   HGBA1C 6.0* 06/05/2013    Scheduled Meds: . aspirin  81 mg Oral Daily  . atenolol  25 mg Oral Daily  . cefTRIAXone (ROCEPHIN)  IV  2 g Intravenous Q12H  . diphenhydrAMINE  25 mg Oral 4 times per day  . escitalopram  5 mg Oral QHS  . feeding supplement (ENSURE COMPLETE)  237 mL Oral TID  . heparin  5,000 Units Subcutaneous 3 times per day  . methylPREDNISolone (SOLU-MEDROL) injection  125 mg Intravenous Q6H  . sodium chloride  3 mL Intravenous Q12H    Continuous Infusions: . sodium chloride      Maylon Peppers RD, LDN, CNSC [phone] Pager [phone] After Hours Pager

## 2013-06-20 DIAGNOSIS — F1910 Other psychoactive substance abuse, uncomplicated: Secondary | ICD-10-CM

## 2013-06-20 DIAGNOSIS — T887XXA Unspecified adverse effect of drug or medicament, initial encounter: Secondary | ICD-10-CM | POA: Diagnosis not present

## 2013-06-20 DIAGNOSIS — R269 Unspecified abnormalities of gait and mobility: Secondary | ICD-10-CM | POA: Diagnosis not present

## 2013-06-20 DIAGNOSIS — A523 Neurosyphilis, unspecified: Secondary | ICD-10-CM | POA: Diagnosis not present

## 2013-06-20 DIAGNOSIS — B20 Human immunodeficiency virus [HIV] disease: Secondary | ICD-10-CM | POA: Diagnosis not present

## 2013-06-20 DIAGNOSIS — R21 Rash and other nonspecific skin eruption: Secondary | ICD-10-CM | POA: Diagnosis not present

## 2013-06-20 DIAGNOSIS — Z21 Asymptomatic human immunodeficiency virus [HIV] infection status: Secondary | ICD-10-CM | POA: Diagnosis not present

## 2013-06-20 DIAGNOSIS — F3160 Bipolar disorder, current episode mixed, unspecified: Secondary | ICD-10-CM

## 2013-06-20 DIAGNOSIS — F332 Major depressive disorder, recurrent severe without psychotic features: Secondary | ICD-10-CM

## 2013-06-20 DIAGNOSIS — A879 Viral meningitis, unspecified: Secondary | ICD-10-CM | POA: Diagnosis not present

## 2013-06-20 LAB — HIV-1 INTEGRASE GENOTYPE
DATE VIRAL LOAD COLLECTED: NO GROWTH
Value last viral load: NO GROWTH

## 2013-06-20 LAB — GLUCOSE, CAPILLARY
GLUCOSE-CAPILLARY: 140 mg/dL — AB (ref 70–99)
GLUCOSE-CAPILLARY: 209 mg/dL — AB (ref 70–99)
GLUCOSE-CAPILLARY: 209 mg/dL — AB (ref 70–99)
Glucose-Capillary: 242 mg/dL — ABNORMAL HIGH (ref 70–99)

## 2013-06-20 MED ORDER — EMTRICITABINE-TENOFOVIR DF 200-300 MG PO TABS
1.0000 | ORAL_TABLET | Freq: Every day | ORAL | Status: DC
  Administered 2013-06-20 – 2013-06-21 (×2): 1 via ORAL
  Filled 2013-06-20 (×2): qty 1

## 2013-06-20 MED ORDER — LORATADINE 10 MG PO TABS
10.0000 mg | ORAL_TABLET | Freq: Every day | ORAL | Status: DC
  Administered 2013-06-20 – 2013-06-21 (×2): 10 mg via ORAL
  Filled 2013-06-20 (×2): qty 1

## 2013-06-20 MED ORDER — DOLUTEGRAVIR SODIUM 50 MG PO TABS
50.0000 mg | ORAL_TABLET | Freq: Every day | ORAL | Status: DC
  Administered 2013-06-20 – 2013-06-21 (×2): 50 mg via ORAL
  Filled 2013-06-20 (×3): qty 1

## 2013-06-20 MED ORDER — PREDNISONE 50 MG PO TABS
50.0000 mg | ORAL_TABLET | Freq: Every day | ORAL | Status: DC
  Filled 2013-06-20 (×3): qty 1

## 2013-06-20 NOTE — Consult Note (Signed)
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 Hypertension   . Second degree AV block   . Syncope   . Pacemaker -Medtronic     DOI 2012   AXIS IV:  other psychosocial or environmental problems and problems related to social environment AXIS V:  31-40 impairment in reality testing  Plan:  Medication management  Subjective:   Shaun Lutz is a 58 y.o. male patient admitted with gate abnormalities and tremors for past several months.  Patient is taking Lexapro 5 mg and denies any side effects.  He endorsed much improvement in his depression however he remained confused and disoriented.  He does not remember this Probation officer but he smiled upon arrival.  He has been not violent, aggressive or agitated.  He continued to express about lack of social support and family but he denies any active suicidal thoughts or plan.  He worried about his physical condition and his future.  Patient is not refusing his medication however he has significant memory impairment and does not remember the details very well.  Patient denies any recent hallucination or any paranoia.    Past Psychiatric History: Past Medical History  Diagnosis Date  . Hypertension   . Second degree AV block   . Syncope   . Pacemaker -Medtronic     DOI 2012    reports that he has been smoking Cigarettes.  He has a 8.75 pack-year smoking history. He does not have any smokeless tobacco history on file. He reports that he does not drink alcohol or use illicit drugs. Family History  Problem Relation Age of Onset  . Cancer Mother   . Heart disease Father   . Heart disease Sister   . Heart disease Brother      Living Arrangements: Non-relatives/Friends   Abuse/Neglect [facility]) Physical Abuse: Denies Verbal Abuse: Denies Sexual Abuse: Denies Allergies:   Allergies   Allergen Reactions  . Penicillins     ACT Assessment Complete:  No:   Past Psychiatric History: Diagnosis:  None  Hospitalizations:  None  Outpatient Care:  None  Substance Abuse Care:  Denies  Self-Mutilation:  Denies  Suicidal Attempts:  Denies  Homicidal Behaviors:  Denies   Violent Behaviors:  Denies   Place of Residence:  Lives with GF Marital Status:  single Employed/Unemployed:  unemployed Education:  unknown Family Supports:  limited Objective: Blood pressure 100/75, pulse 91, temperature 97.5 F (36.4 C), temperature source Oral, resp. rate 18, height 5' 8.11" (1.73 m), weight 119 lb 11.4 oz (54.3 kg), SpO2 95.00%.Body mass index is 18.14 kg/(m^2). Results for orders placed during the hospital encounter of 06/07/13 (from the past 72 hour(s))  MRSA PCR SCREENING     Status: None   Collection Time    06/19/13  5:15 AM      Result Value Ref Range   MRSA by PCR NEGATIVE  NEGATIVE   Comment:            The GeneXpert MRSA Assay (FDA     approved for NASAL specimens     only), is one component of a     comprehensive MRSA colonization     surveillance program. It is not     intended to diagnose MRSA     infection nor to guide or     monitor treatment for  MRSA infections.  CBC WITH DIFFERENTIAL     Status: Abnormal   Collection Time    06/19/13  6:26 AM      Result Value Ref Range   WBC 12.2 (*) 4.0 - 10.5 K/uL   RBC 3.89 (*) 4.22 - 5.81 MIL/uL   Hemoglobin 12.0 (*) 13.0 - 17.0 g/dL   HCT 35.9 (*) 39.0 - 52.0 %   MCV 92.3  78.0 - 100.0 fL   MCH 30.8  26.0 - 34.0 pg   MCHC 33.4  30.0 - 36.0 g/dL   RDW 12.8  11.5 - 15.5 %   Platelets 294  150 - 400 K/uL   Neutrophils Relative % 84 (*) 43 - 77 %   Neutro Abs 10.2 (*) 1.7 - 7.7 K/uL   Lymphocytes Relative 5 (*) 12 - 46 %   Lymphs Abs 0.6 (*) 0.7 - 4.0 K/uL   Monocytes Relative 3  3 - 12 %   Monocytes Absolute 0.4  0.1 - 1.0 K/uL   Eosinophils Relative 8 (*) 0 - 5 %   Eosinophils Absolute 1.0 (*) 0.0 - 0.7  K/uL   Basophils Relative 0  0 - 1 %   Basophils Absolute 0.0  0.0 - 0.1 K/uL  COMPREHENSIVE METABOLIC PANEL     Status: Abnormal   Collection Time    06/19/13  6:26 AM      Result Value Ref Range   Sodium 126 (*) 137 - 147 mEq/L   Potassium 5.4 (*) 3.7 - 5.3 mEq/L   Chloride 89 (*) 96 - 112 mEq/L   CO2 25  19 - 32 mEq/L   Glucose, Bld 117 (*) 70 - 99 mg/dL   BUN 14  6 - 23 mg/dL   Creatinine, Ser 0.86  0.50 - 1.35 mg/dL   Calcium 8.6  8.4 - 10.5 mg/dL   Total Protein 7.5  6.0 - 8.3 g/dL   Albumin 2.4 (*) 3.5 - 5.2 g/dL   AST 35  0 - 37 U/L   ALT 49  0 - 53 U/L   Alkaline Phosphatase 86  39 - 117 U/L   Total Bilirubin 0.3  0.3 - 1.2 mg/dL   GFR calc non Af Amer >90  >90 mL/min   GFR calc Af Amer >90  >90 mL/min   Comment: (NOTE)     The eGFR has been calculated using the CKD EPI equation.     This calculation has not been validated in all clinical situations.     eGFR's persistently <90 mL/min signify possible Chronic Kidney     Disease.  BASIC METABOLIC PANEL     Status: Abnormal   Collection Time    06/19/13  1:16 PM      Result Value Ref Range   Sodium 129 (*) 137 - 147 mEq/L   Potassium 5.0  3.7 - 5.3 mEq/L   Chloride 89 (*) 96 - 112 mEq/L   CO2 22  19 - 32 mEq/L   Glucose, Bld 296 (*) 70 - 99 mg/dL   BUN 16  6 - 23 mg/dL   Creatinine, Ser 0.78  0.50 - 1.35 mg/dL   Calcium 9.0  8.4 - 10.5 mg/dL   GFR calc non Af Amer >90  >90 mL/min   GFR calc Af Amer >90  >90 mL/min   Comment: (NOTE)     The eGFR has been calculated using the CKD EPI equation.     This calculation has not been validated in  all clinical situations.     eGFR's persistently <90 mL/min signify possible Chronic Kidney     Disease.  GLUCOSE, CAPILLARY     Status: Abnormal   Collection Time    06/19/13  5:19 PM      Result Value Ref Range   Glucose-Capillary 245 (*) 70 - 99 mg/dL   Comment 1 Notify RN     Comment 2 Documented in Chart    GLUCOSE, CAPILLARY     Status: Abnormal   Collection Time     06/19/13 10:08 PM      Result Value Ref Range   Glucose-Capillary 173 (*) 70 - 99 mg/dL   Comment 1 Notify RN     Comment 2 Documented in Chart    GLUCOSE, CAPILLARY     Status: Abnormal   Collection Time    06/20/13  7:55 AM      Result Value Ref Range   Glucose-Capillary 140 (*) 70 - 99 mg/dL  GLUCOSE, CAPILLARY     Status: Abnormal   Collection Time    06/20/13 11:54 AM      Result Value Ref Range   Glucose-Capillary 242 (*) 70 - 99 mg/dL   Labs are reviewed.  Current Facility-Administered Medications  Medication Dose Route Frequency Provider Last Rate Last Dose  . 0.9 %  sodium chloride infusion   Intravenous Continuous Langston Masker, MD 75 mL/hr at 06/20/13 1006    . aspirin chewable tablet 81 mg  81 mg Oral Daily Andrena Mews, MD   81 mg at 06/20/13 0935  . atenolol (TENORMIN) tablet 25 mg  25 mg Oral Daily Cordelia Poche, MD   25 mg at 06/20/13 0935  . cefTRIAXone (ROCEPHIN) 2 g in dextrose 5 % 50 mL IVPB  2 g Intravenous Q24H Truman Hayward, MD   2 g at 06/20/13 [phone]  . dolutegravir (TIVICAY) tablet 50 mg  50 mg Oral Daily Truman Hayward, MD      . emtricitabine-tenofovir (TRUVADA) 200-300 MG per tablet 1 tablet  1 tablet Oral Daily Truman Hayward, MD   1 tablet at 06/20/13 1404  . escitalopram (LEXAPRO) tablet 5 mg  5 mg Oral QHS Langston Masker, MD   5 mg at 06/19/13 2310  . feeding supplement (ENSURE COMPLETE) (ENSURE COMPLETE) liquid 237 mL  237 mL Oral TID Erlene Quan, RD   237 mL at 06/20/13 0936  . heparin injection 5,000 Units  5,000 Units Subcutaneous 3 times per day Langston Masker, MD   5,000 Units at 06/20/13 1405  . insulin aspart (novoLOG) injection 0-5 Units  0-5 Units Subcutaneous QHS [name], MD      . insulin aspart (novoLOG) injection 0-9 Units  0-9 Units Subcutaneous TID WC [name], MD   3 Units at 06/20/13 1158  . loratadine (CLARITIN) tablet 10 mg  10 mg Oral Daily Cordelia Poche, MD   10 mg at 06/20/13 1158  . [START  ON 06/21/2013] predniSONE (DELTASONE) tablet 50 mg  50 mg Oral Q breakfast Cordelia Poche, MD      . sodium chloride 0.9 % injection 3 mL  3 mL Intravenous Q12H Langston Masker, MD   3 mL at 06/19/13 2230   Mental status examination Patient remains confused and depressed.  He denies any active or passive suicidal thoughts are homicidal thoughts.  He is pleasant but disoriented.  His thought process remains very slow and at times loose.  His attention and  concentration is poor.  He has difficulty remembering things.  There is no anger or agitation.  Treatment Plan Summary: Continue Lexapro 5 mg daily since patient is not complaining of side effects.  At this time patient does not have the capacity to participate in his treatment plan.  Patient can be seen outpatient upon discharge at [facility] or  infectious disease clinic for medication management.  Please call [phone] if you have any question.   Dossie Der T Arfeen 06/20/2013 2:16 PM

## 2013-06-20 NOTE — Clinical Social Work Psych Note (Signed)
Psych CSW was notified that pt continues to have sitter at bedside per psychiatry documentation on 06/15/2013,  no disposition recommendation was given.    Medical disposition possibly CIR.  Psych CSW requested a re-consult for disposition purposes.   Nonnie Done, [place] [phone]  Clinical Social Work

## 2013-06-20 NOTE — Progress Notes (Signed)
FPTS PGY-2 Interim Note  Paged by RN; pt reportedly intermittently confused, at times pulling at IV's, often trying to get up despite being in camera room with bed alarm. Pt not specifically actively suicidal but has stated he "feels like he's dying" etc. Will re-order sitter for fall risk and safety concerns and monitor. Otherwise, continue per prior progress notes.  Emmaline Kluver, MD PGY-2, [facility] 06/20/2013, 9:20 PM [place] Service pager: [phone] (text pages welcome through [facility])

## 2013-06-20 NOTE — Progress Notes (Signed)
[facility] for Infectious Disease    11 days of  of full 24 MU of IV PCN 2 days  of ceftriaxone)  Subjective:  Rash seems stable overnight  Antibiotics:  Anti-infectives   Start     Dose/Rate Route Frequency Ordered Stop   06/20/13 1000  cefTRIAXone (ROCEPHIN) 2 g in dextrose 5 % 50 mL IVPB     2 g 100 mL/hr over 30 Minutes Intravenous Every 24 hours 06/19/13 1146     06/19/13 1000  penicillin G potassium 4 Million Units in dextrose 5 % 250 mL IVPB  Status:  Discontinued     4 Million Units 250 mL/hr over 60 Minutes Intravenous Every 4 hours 06/19/13 0503 06/19/13 0807   06/19/13 1000  elvitegravir-cobicistat-emtricitabine-tenofovir (STRIBILD) 150-150-200-300 MG tablet 1 tablet  Status:  Discontinued     1 tablet Oral Daily with breakfast 06/19/13 0503 06/19/13 0807   06/19/13 1000  cefTRIAXone (ROCEPHIN) 2 g in dextrose 5 % 50 mL IVPB  Status:  Discontinued     2 g 100 mL/hr over 30 Minutes Intravenous Every 12 hours 06/19/13 0807 06/19/13 1146   06/17/13 0800  elvitegravir-cobicistat-emtricitabine-tenofovir (STRIBILD) 150-150-200-300 MG tablet 1 tablet  Status:  Discontinued     1 tablet Oral Daily with breakfast 06/16/13 1429 06/19/13 0503   06/08/13 0200  penicillin G potassium 4 Million Units in dextrose 5 % 250 mL IVPB  Status:  Discontinued     4 Million Units 250 mL/hr over 60 Minutes Intravenous Every 4 hours 06/08/13 0033 06/19/13 0503   06/07/13 2200  cefTRIAXone (ROCEPHIN) 2 g in dextrose 5 % 50 mL IVPB  Status:  Discontinued     2 g 100 mL/hr over 30 Minutes Intravenous Every 12 hours 06/07/13 1847 06/07/13 1852   06/07/13 2200  cefTRIAXone (ROCEPHIN) 2 g in dextrose 5 % 50 mL IVPB  Status:  Discontinued    Comments:  Please draw blood cultures before antibiotics   2 g 100 mL/hr over 30 Minutes Intravenous Every 12 hours 06/07/13 1852 06/08/13 1459   06/07/13 2200  acyclovir (ZOVIRAX) 525 mg in dextrose 5 % 100 mL IVPB  Status:  Discontinued     10  mg/kg  52.5 kg 110.5 mL/hr over 60 Minutes Intravenous 3 times per day 06/07/13 1915 06/09/13 1041   06/07/13 2000  vancomycin (VANCOCIN) IVPB 750 mg/150 ml premix  Status:  Discontinued     750 mg 150 mL/hr over 60 Minutes Intravenous Every 12 hours 06/07/13 1915 06/08/13 1459   06/07/13 2000  Ampicillin-Sulbactam (UNASYN) 3 g in sodium chloride 0.9 % 100 mL IVPB  Status:  Discontinued     3 g 100 mL/hr over 60 Minutes Intravenous Every 6 hours 06/07/13 1915 06/08/13 0033      Medications: Scheduled Meds: . aspirin  81 mg Oral Daily  . atenolol  25 mg Oral Daily  . cefTRIAXone (ROCEPHIN)  IV  2 g Intravenous Q24H  . escitalopram  5 mg Oral QHS  . feeding supplement (ENSURE COMPLETE)  237 mL Oral TID  . heparin  5,000 Units Subcutaneous 3 times per day  . insulin aspart  0-5 Units Subcutaneous QHS  . insulin aspart  0-9 Units Subcutaneous TID WC  . loratadine  10 mg Oral Daily  . [START ON 06/21/2013] predniSONE  50 mg Oral Q breakfast  . sodium chloride  3 mL Intravenous Q12H   Continuous Infusions: . sodium chloride 75 mL/hr at 06/20/13 1006  PRN Meds:.    Objective: Weight change:   Intake/Output Summary (Last 24 hours) at 06/20/13 1328 Last data filed at 06/20/13 1200  Gross per 24 hour  Intake 2460.5 ml  Output   1825 ml  Net  635.5 ml   Blood pressure 100/75, pulse 91, temperature 97.5 F (36.4 C), temperature source Oral, resp. rate 18, height 5' 8.11" (1.73 m), weight 119 lb 11.4 oz (54.3 kg), SpO2 95.00%. Temp:  [97.4 F (36.3 C)-97.5 F (36.4 C)] 97.5 F (36.4 C) (04/21 0700) Pulse Rate:  [69-108] 91 (04/21 1200) Resp:  [15-30] 18 (04/21 1200) BP: (79-156)/(52-108) 100/75 mmHg (04/21 1200) SpO2:  [94 %-100 %] 95 % (04/21 1200)  Physical Exam: AO person but not situation HEENT: anicteric sclera,  CVS regular rate, normal r, no murmur rubs or gallops  Chest: clear to auscultation bilaterally, no wheezing, rales or rhonchi  Abdomen: soft nontender,  nondistended, normal bowel sounds,   Skin: hyperpigmented lesions seen below and pictures   Rash on Friday with extensive areas of excoriation 06/16/13:    Rash on back4/20/15 actually looks better vs Friday though he is sp steroids     Rash on 06/20/13 back:       Exfolation on face yesterday 06/19/13:     Face today 06/20/13          Ulcerative lesion on buccal mucosa with horrible dentition 06/19/13:      Today 06/20/13:      CBC:  Recent Labs Lab 06/15/13 1500 06/19/13 0626  HGB 11.5* 12.0*  HCT 35.4* 35.9*  PLT 255 294     BMET  Recent Labs  06/19/13 0626 06/19/13 1316  NA 126* 129*  K 5.4* 5.0  CL 89* 89*  CO2 25 22  GLUCOSE 117* 296*  BUN 14 16  CREATININE 0.86 0.78  CALCIUM 8.6 9.0     Liver Panel   Recent Labs  06/19/13 0626  PROT 7.5  ALBUMIN 2.4*  AST 35  ALT 49  ALKPHOS 86  BILITOT 0.3       Sedimentation Rate No results found for this basename: ESRSEDRATE,  in the last 72 hours C-Reactive Protein No results found for this basename: CRP,  in the last 72 hours  Micro Results: Recent Results (from the past 240 hour(s))  CLOSTRIDIUM DIFFICILE BY PCR     Status: None   Collection Time    06/11/13  6:05 PM      Result Value Ref Range Status   C difficile by pcr NEGATIVE  NEGATIVE Final  MRSA PCR SCREENING     Status: None   Collection Time    06/19/13  5:15 AM      Result Value Ref Range Status   MRSA by PCR NEGATIVE  NEGATIVE Final   Comment:            The GeneXpert MRSA Assay (FDA     approved for NASAL specimens     only), is one component of a     comprehensive MRSA colonization     surveillance program. It is not     intended to diagnose MRSA     infection nor to guide or     monitor treatment for     MRSA infections.    Studies/Results: No results found.    Assessment/Plan:  Active Problems:   ATRIOVENTRICULAR BLOCK, MOBITZ TYPE II   Pacemaker- medtronic   Abnormality of gait    Human immunodeficiency virus (HIV) disease  Lower extremity weakness   Syphilis   Malnutrition of moderate degree    Thelma Viana is a 58 y.o. 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 Now with apparent worsening of rash concerning for PCN allergy overnight sp IV corticosteroids in transfer to step down unit change some IV penicillin ceftriaxone   #1 Neurosyphilis:  With todays dose of ceftriaxone he has now had 13 days of IV therapy for Neurosyphilis (and guidelines indicate should treate for 10-14 days)  If he can tolerate ceftriaxone further can push for ONE MORE DOSE tomorrow   Certainly if any evidence of further ADR would stop the Beta lactam since he will have completed full course of therapy  WOULD NOT try to desensitize him in this setting  He will need followup serial RPR and likely repeat LP with VDRL wbc, cell count and diff, protein and glucose per CDC guidelines (none of that would be during this admissioN)  GIVEn concerne for PCN induced rash would not give him post IV , IM PCN anymore  #2 Rash: complicated pt who already had pruritic rash last week, prior vitiligo. Not crystal clear that this is PCN rash but it is worrisome enough that I agree stopping PCN (despite it and Ceph being only effective rx for Neurosyphilis)> As above we should have a VERY low threshold to stop even the ceftriaxone. 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 He is NOT competent and this was confirmed by psychiatry. He will likely need placment long term as I dont think he will be cabpable of caring for himself  #4 Depression and SI: Perhaps best NOT to continually bring up his diagnoses since he apparently forgets yet again, Hopefully  there is some improvement in his cognition he is on SSRI       LOS: 13 days   Truman Hayward 06/20/2013, 1:28 PM

## 2013-06-20 NOTE — Progress Notes (Signed)
Seen and examined.  Agree with Dr. Burt Ek documentation and management.

## 2013-06-21 ENCOUNTER — Inpatient Hospital Stay (HOSPITAL_COMMUNITY)
Admission: RE | Admit: 2013-06-21 | Discharge: 2013-06-30 | DRG: 945 | Disposition: A | Discharge status: Home Health | Payer: Medicare Other | Source: Intra-hospital | Attending: Physical Medicine & Rehabilitation | Admitting: Physical Medicine & Rehabilitation

## 2013-06-21 DIAGNOSIS — F3289 Other specified depressive episodes: Secondary | ICD-10-CM | POA: Diagnosis present

## 2013-06-21 DIAGNOSIS — Z5189 Encounter for other specified aftercare: Principal | ICD-10-CM

## 2013-06-21 DIAGNOSIS — Z7982 Long term (current) use of aspirin: Secondary | ICD-10-CM

## 2013-06-21 DIAGNOSIS — F17200 Nicotine dependence, unspecified, uncomplicated: Secondary | ICD-10-CM | POA: Diagnosis present

## 2013-06-21 DIAGNOSIS — Z8249 Family history of ischemic heart disease and other diseases of the circulatory system: Secondary | ICD-10-CM

## 2013-06-21 DIAGNOSIS — F0280 Dementia in other diseases classified elsewhere without behavioral disturbance: Secondary | ICD-10-CM

## 2013-06-21 DIAGNOSIS — J449 Chronic obstructive pulmonary disease, unspecified: Secondary | ICD-10-CM | POA: Diagnosis present

## 2013-06-21 DIAGNOSIS — E440 Moderate protein-calorie malnutrition: Secondary | ICD-10-CM

## 2013-06-21 DIAGNOSIS — L80 Vitiligo: Secondary | ICD-10-CM | POA: Diagnosis present

## 2013-06-21 DIAGNOSIS — F32A Depression, unspecified: Secondary | ICD-10-CM

## 2013-06-21 DIAGNOSIS — R21 Rash and other nonspecific skin eruption: Secondary | ICD-10-CM | POA: Diagnosis not present

## 2013-06-21 DIAGNOSIS — L270 Generalized skin eruption due to drugs and medicaments taken internally: Secondary | ICD-10-CM | POA: Diagnosis not present

## 2013-06-21 DIAGNOSIS — R269 Unspecified abnormalities of gait and mobility: Secondary | ICD-10-CM

## 2013-06-21 DIAGNOSIS — F329 Major depressive disorder, single episode, unspecified: Secondary | ICD-10-CM | POA: Diagnosis present

## 2013-06-21 DIAGNOSIS — Z21 Asymptomatic human immunodeficiency virus [HIV] infection status: Secondary | ICD-10-CM | POA: Diagnosis present

## 2013-06-21 DIAGNOSIS — Z950 Presence of cardiac pacemaker: Secondary | ICD-10-CM

## 2013-06-21 DIAGNOSIS — I959 Hypotension, unspecified: Secondary | ICD-10-CM | POA: Diagnosis not present

## 2013-06-21 DIAGNOSIS — R339 Retention of urine, unspecified: Secondary | ICD-10-CM | POA: Diagnosis present

## 2013-06-21 DIAGNOSIS — I10 Essential (primary) hypertension: Secondary | ICD-10-CM | POA: Diagnosis present

## 2013-06-21 DIAGNOSIS — R45851 Suicidal ideations: Secondary | ICD-10-CM

## 2013-06-21 DIAGNOSIS — R5381 Other malaise: Secondary | ICD-10-CM | POA: Diagnosis not present

## 2013-06-21 DIAGNOSIS — R262 Difficulty in walking, not elsewhere classified: Secondary | ICD-10-CM | POA: Diagnosis present

## 2013-06-21 DIAGNOSIS — A523 Neurosyphilis, unspecified: Secondary | ICD-10-CM | POA: Diagnosis not present

## 2013-06-21 DIAGNOSIS — B20 Human immunodeficiency virus [HIV] disease: Secondary | ICD-10-CM

## 2013-06-21 DIAGNOSIS — R259 Unspecified abnormal involuntary movements: Secondary | ICD-10-CM | POA: Diagnosis present

## 2013-06-21 DIAGNOSIS — T360X5A Adverse effect of penicillins, initial encounter: Secondary | ICD-10-CM | POA: Diagnosis present

## 2013-06-21 DIAGNOSIS — J4489 Other specified chronic obstructive pulmonary disease: Secondary | ICD-10-CM | POA: Diagnosis present

## 2013-06-21 DIAGNOSIS — A5211 Tabes dorsalis: Secondary | ICD-10-CM

## 2013-06-21 DIAGNOSIS — T887XXA Unspecified adverse effect of drug or medicament, initial encounter: Secondary | ICD-10-CM | POA: Diagnosis not present

## 2013-06-21 LAB — CBC
HCT: 34.2 % — ABNORMAL LOW (ref 39.0–52.0)
Hemoglobin: 11.3 g/dL — ABNORMAL LOW (ref 13.0–17.0)
MCH: 30.7 pg (ref 26.0–34.0)
MCHC: 33 g/dL (ref 30.0–36.0)
MCV: 92.9 fL (ref 78.0–100.0)
PLATELETS: 356 10*3/uL (ref 150–400)
RBC: 3.68 MIL/uL — ABNORMAL LOW (ref 4.22–5.81)
RDW: 13.5 % (ref 11.5–15.5)
WBC: 13.1 10*3/uL — ABNORMAL HIGH (ref 4.0–10.5)

## 2013-06-21 LAB — CREATININE, SERUM
Creatinine, Ser: 0.86 mg/dL (ref 0.50–1.35)
GFR calc Af Amer: >90 mL/min (ref >90)
GFR calc non Af Amer: >90 mL/min (ref >90)

## 2013-06-21 LAB — GLUCOSE, CAPILLARY
GLUCOSE-CAPILLARY: 142 mg/dL — AB (ref 70–99)
Glucose-Capillary: 141 mg/dL — ABNORMAL HIGH (ref 70–99)
Glucose-Capillary: 231 mg/dL — ABNORMAL HIGH (ref 70–99)

## 2013-06-21 LAB — QUANTIFERON TB GOLD ASSAY (BLOOD)
MITOGEN VALUE: 0.09 [IU]/mL
Quantiferon Nil Value: 0.02 IU/mL
TB Ag value: 0.02 IU/mL
TB Antigen Minus Nil Value: 0 IU/mL

## 2013-06-21 MED ORDER — ENSURE COMPLETE PO LIQD
237.0000 mL | Freq: Three times a day (TID) | ORAL | Status: DC
  Administered 2013-06-21 – 2013-06-30 (×25): 237 mL via ORAL

## 2013-06-21 MED ORDER — HALOPERIDOL 0.5 MG PO TABS
0.5000 mg | ORAL_TABLET | Freq: Once | ORAL | Status: AC
  Administered 2013-06-21: 0.5 mg via ORAL
  Filled 2013-06-21: qty 1

## 2013-06-21 MED ORDER — HEPARIN SODIUM (PORCINE) 5000 UNIT/ML IJ SOLN: 5000.0000 [IU] | Freq: Three times a day (TID) | INTRAMUSCULAR | Status: DC

## 2013-06-21 MED ORDER — ACETAMINOPHEN 325 MG PO TABS
325.0000 mg | ORAL_TABLET | ORAL | Status: DC | PRN
  Administered 2013-06-25 – 2013-06-28 (×2): 650 mg via ORAL
  Filled 2013-06-21 (×2): qty 2

## 2013-06-21 MED ORDER — HYDROXYZINE HCL 10 MG PO TABS
10.0000 mg | ORAL_TABLET | Freq: Three times a day (TID) | ORAL | Status: DC | PRN
  Administered 2013-06-21 – 2013-06-29 (×4): 10 mg via ORAL
  Filled 2013-06-21 (×4): qty 1

## 2013-06-21 MED ORDER — HALOPERIDOL 0.5 MG PO TABS: 0.5000 mg | ORAL_TABLET | Freq: Every day | ORAL | Status: DC

## 2013-06-21 MED ORDER — ESCITALOPRAM OXALATE 5 MG PO TABS
5.0000 mg | ORAL_TABLET | Freq: Every day | ORAL | Status: DC
  Administered 2013-06-21 – 2013-06-29 (×9): 5 mg via ORAL
  Filled 2013-06-21 (×10): qty 1

## 2013-06-21 MED ORDER — DOLUTEGRAVIR SODIUM 50 MG PO TABS: 50.0000 mg | ORAL_TABLET | Freq: Every day | ORAL | Status: DC

## 2013-06-21 MED ORDER — INSULIN ASPART 100 UNIT/ML ~~LOC~~ SOLN: 0.0000 [IU] | Freq: Every day | SUBCUTANEOUS | Status: DC

## 2013-06-21 MED ORDER — ENSURE COMPLETE PO LIQD: 237.0000 mL | Freq: Three times a day (TID) | ORAL | Status: DC

## 2013-06-21 MED ORDER — HEPARIN SODIUM (PORCINE) 5000 UNIT/ML IJ SOLN
5000.0000 [IU] | Freq: Three times a day (TID) | INTRAMUSCULAR | Status: DC
Start: 2013-06-21 — End: 2013-06-30

## 2013-06-21 MED ORDER — ONDANSETRON HCL 4 MG PO TABS: 4.0000 mg | ORAL_TABLET | Freq: Four times a day (QID) | ORAL | Status: DC | PRN

## 2013-06-21 MED ORDER — ASPIRIN 81 MG PO CHEW
81.0000 mg | CHEWABLE_TABLET | Freq: Every day | ORAL | Status: DC
  Administered 2013-06-22 – 2013-06-30 (×9): 81 mg via ORAL
  Filled 2013-06-21 (×9): qty 1

## 2013-06-21 MED ORDER — ONDANSETRON HCL 4 MG/2ML IJ SOLN: 4.0000 mg | Freq: Four times a day (QID) | INTRAMUSCULAR | Status: DC | PRN

## 2013-06-21 MED ORDER — HALOPERIDOL 0.5 MG PO TABS
0.5000 mg | ORAL_TABLET | Freq: Every day | ORAL | Status: DC
  Filled 2013-06-21: qty 1

## 2013-06-21 MED ORDER — EMTRICITABINE-TENOFOVIR DF 200-300 MG PO TABS
1.0000 | ORAL_TABLET | Freq: Every day | ORAL | Status: DC
  Administered 2013-06-22 – 2013-06-30 (×9): 1 via ORAL
  Filled 2013-06-21 (×10): qty 1

## 2013-06-21 MED ORDER — INSULIN ASPART 100 UNIT/ML ~~LOC~~ SOLN
0.0000 [IU] | Freq: Every day | SUBCUTANEOUS | Status: DC
  Administered 2013-06-22: 2 [IU] via SUBCUTANEOUS

## 2013-06-21 MED ORDER — EMTRICITABINE-TENOFOVIR DF 200-300 MG PO TABS: 1.0000 | ORAL_TABLET | Freq: Every day | ORAL | Status: DC

## 2013-06-21 MED ORDER — ATENOLOL 25 MG PO TABS
25.0000 mg | ORAL_TABLET | Freq: Every day | ORAL | Status: DC
  Administered 2013-06-22 – 2013-06-30 (×9): 25 mg via ORAL
  Filled 2013-06-21 (×10): qty 1

## 2013-06-21 MED ORDER — HALOPERIDOL LACTATE 5 MG/ML IJ SOLN: 0.5000 mg | Freq: Once | INTRAMUSCULAR | Status: AC

## 2013-06-21 MED ORDER — ESCITALOPRAM OXALATE 5 MG PO TABS: 5.0000 mg | ORAL_TABLET | Freq: Every day | ORAL | Status: DC

## 2013-06-21 MED ORDER — HEPARIN SODIUM (PORCINE) 5000 UNIT/ML IJ SOLN
5000.0000 [IU] | Freq: Three times a day (TID) | INTRAMUSCULAR | Status: DC
  Administered 2013-06-21 – 2013-06-30 (×26): 5000 [IU] via SUBCUTANEOUS
  Filled 2013-06-21 (×29): qty 1

## 2013-06-21 MED ORDER — PREDNISONE 20 MG PO TABS
20.0000 mg | ORAL_TABLET | Freq: Every day | ORAL | Status: DC
  Administered 2013-06-22 – 2013-06-28 (×7): 20 mg via ORAL
  Filled 2013-06-21 (×8): qty 1

## 2013-06-21 MED ORDER — INSULIN ASPART 100 UNIT/ML ~~LOC~~ SOLN: 0.0000 [IU] | Freq: Three times a day (TID) | SUBCUTANEOUS | Status: DC

## 2013-06-21 MED ORDER — DOLUTEGRAVIR SODIUM 50 MG PO TABS
50.0000 mg | ORAL_TABLET | Freq: Every day | ORAL | Status: DC
  Administered 2013-06-22 – 2013-06-30 (×9): 50 mg via ORAL
  Filled 2013-06-21 (×11): qty 1

## 2013-06-21 MED ORDER — SORBITOL 70 % SOLN
30.0000 mL | Freq: Every day | Status: DC | PRN
  Administered 2013-06-26: 30 mL via ORAL
  Filled 2013-06-21: qty 30

## 2013-06-21 MED ORDER — PREDNISONE 20 MG PO TABS: 20.0000 mg | ORAL_TABLET | Freq: Every day | ORAL | Status: DC

## 2013-06-21 NOTE — Progress Notes (Signed)
Pt is refusing to allow RN to give him the antibiotics through his IV. Pt repeatedly saying "my IV doesn't work, I'm not going to kill myself, I'm leaving tomorrow." RN educated the pt on the importance of his IV antibiotics, pt is still refusing to allow RN to give him any medications through his IV

## 2013-06-21 NOTE — Progress Notes (Signed)
Physical Therapy Treatment Patient Details Name: Shaun Lutz MRN: [id number] DOB: 27-Feb-1956 Today's Date: 06/21/2013    History of Present Illness Shaun Lutz is a 58 y.o. male with  with COPD, Pacemaker (medtronic 2012) frequent falls gait abnormality found to have HIV and syphilis with a high titer of 1-512, status post CT imaging of the brain a lumbar puncture with high white count extremely low glucose and very high protein.4/20 moved to ICU due to sudden onset of rash and unknown if would progress to respiratory comprimise    PT Comments    Pt progressing mobility slowly. Continues to be high fall risk due to balance deficits and decreased safety awareness. 2 person helpful for ambulation for safety. Sitter in room present.   Follow Up Recommendations  Supervision/Assistance - 24 hour;CIR     Equipment Recommendations  Rolling walker with 5" wheels    Recommendations for Other Services       Precautions / Restrictions Precautions Precautions: Fall Precaution Comments: body tremors during functional mobility  Restrictions Weight Bearing Restrictions: No    Mobility  Bed Mobility Overal bed mobility: Needs Assistance Bed Mobility: Supine to Sit   Sidelying to sit: Min assist;HOB elevated       General bed mobility comments: cues for hand placement and sequencing; pt required min (A) to elevate trunk to sitting position at EOB; pt requires incr time; termors throughout   Transfers Overall transfer level: Needs assistance Equipment used: Rolling walker (2 wheeled) Transfers: Sit to/from Stand Sit to Stand: Mod assist         General transfer comment: pt with posterior lean while standing; LEs bracing on bed; mod (A) to maintain balance; cues for hand placement and sequencing   Ambulation/Gait Ambulation/Gait assistance: Mod assist Ambulation Distance (Feet): 150 Feet Assistive device: Rolling walker (2 wheeled) Gait Pattern/deviations: Step-through  pattern;Shuffle;Decreased stride length;Trunk flexed;Narrow base of support;Drifts right/left Gait velocity: slow; guarded for falls Gait velocity interpretation: Below normal speed for age/gender General Gait Details: pt able to ambulate increased distance without rest break today; 2 person (A) helpful for safety; pt with LE tremors and buckling inconsistently; cues for sequencing    Stairs            Wheelchair Mobility    Modified Rankin (Stroke Patients Only)       Balance Overall balance assessment: History of Falls;Needs assistance Sitting-balance support: Feet supported;Single extremity supported Sitting balance-Leahy Scale: Poor Sitting balance - Comments: UE supported; tolerated sitting 3 min  Postural control: Posterior lean Standing balance support: During functional activity;Bilateral upper extremity supported Standing balance-Leahy Scale: Poor Standing balance comment: requires (A) and bil UE supported                     Cognition Arousal/Alertness: Awake/alert Behavior During Therapy: WFL for tasks assessed/performed Overall Cognitive Status: Impaired/Different from baseline Area of Impairment: Safety/judgement;Problem solving;Following commands       Following Commands: Follows one step commands with increased time;Follows one step commands inconsistently Safety/Judgement: Decreased awareness of safety   Problem Solving: Slow processing;Decreased initiation;Difficulty sequencing;Requires verbal cues;Requires tactile cues      Exercises      General Comments General comments (skin integrity, edema, etc.): pt with sitter present       Pertinent Vitals/Pain Denies any pain     Home Living                      Prior Function  PT Goals (current goals can now be found in the care plan section) Acute Rehab PT Goals Patient Stated Goal: To get stronger PT Goal Formulation: With patient Time For Goal Achievement:  06/28/13 Potential to Achieve Goals: Good Progress towards PT goals: Progressing toward goals    Frequency  Min 3X/week    PT Plan Current plan remains appropriate    Co-evaluation             End of Session Equipment Utilized During Treatment: Gait belt Activity Tolerance: Patient limited by fatigue Patient left: in chair;with call bell/phone within reach;with nursing/sitter in room     Time: 1100-1113 PT Time Calculation (min): 13 min  Charges:  $Gait Training: 8-22 mins                    G CodesKennis Carina [place], [place] [phone] 06/21/2013, 12:56 PM

## 2013-06-21 NOTE — H&P (Signed)
Physical Medicine and Rehabilitation Admission H&P    Chief Complaint  Patient presents with  . Shaking  : HPI: Shaun Lutz is a 58 y.o. 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 He was admitted via Neurology office on 06/07/13 with positive HIV as well as syphilis and concerns of neurosyphilis. Lumbar puncture showed white blood cell count of 86 with 60% segmented neutrophils 35% lymphocytes. CSF protein greater than 600 and glucose of 7. Gram stain negative for any organisms and cryptococcal antigen on CSF was negative. CSF viral culture pending. 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 Dr. Tommy Medal consulted and recommended 14 days of high-dose penicillin and 3 doses of weekly PCN. Patient has had difficulty accepting diagnosis with high levels of anxiety and passive suicidal ideation. Dr. Adele Schilder consulted for input and recommended Lexapro for depressed mood. Patient with poor attention, impaired concentration as well as poor memory--he is not felt to have capacity to participate in his treatment plan. He developed urticarial rash likely due to drug reaction--PCN changed to ceftriaxone and STRIBILD d/c. He was started on TIVICAY and TRUVADA on 06/20/13. He remains confused and disoriented with poor concentration and slow/loose thought process. Physical occupational therapy ongoing. Patient was admitted for a comprehensive rehabilitation program   ROS Review of Systems  HENT: Positive for hearing loss.  Cardiovascular: Positive for palpitations.  Musculoskeletal: Positive for falls.  Neurological: Positive for weakness.  Syncope  All other systems reviewed and are negative  Past Medical History  Diagnosis Date  . Hypertension   . Second degree AV block   . Syncope   . Pacemaker -Medtronic     DOI 2012   Past  Surgical History  Procedure Laterality Date  . Pacemaker insertion     Family History  Problem Relation Age of Onset  . Cancer Mother   . Heart disease Father   . Heart disease Sister   . Heart disease Brother    Social History:  reports that he has been smoking Cigarettes.  He has a 8.75 pack-year smoking history. He does not have any smokeless tobacco history on file. He reports that he does not drink alcohol or use illicit drugs. Allergies:  Allergies  Allergen Reactions  . Penicillins    Medications Prior to Admission  Medication Sig Dispense Refill  . aspirin 81 MG tablet Take 1 tablet (81 mg total) by mouth daily.  180 tablet  2  . atenolol (TENORMIN) 25 MG tablet Take 25 mg by mouth daily.      Marland Kitchen lisinopril (PRINIVIL,ZESTRIL) 5 MG tablet Take 1 tablet (5 mg total) by mouth daily.  90 tablet  3  . Nutritional Supplements (ENSURE ACTIVE HIGH PROTEIN) LIQD Take 1 Can by mouth 3 (three) times daily.  60 Can  2  . carbidopa-levodopa (SINEMET IR) 25-100 MG per tablet Take 1 tablet by mouth 3 (three) times daily.  90 tablet  5    Home: Home Living Family/patient expects to be discharged to:: Private residence Living Arrangements: Non-relatives/Friends Available Help at Discharge: Other (Comment) (girlfriend) Type of Home: House Home Access: Stairs to enter [employer] of Steps: 4 Entrance Stairs-Rails: Right Home Layout: One level Home Equipment: None   Functional History: Prior Function Level of Independence: Independent Comments: states girlfriend is disabled and has an aide that does cleaning,  cooking, etc  Functional Status:  Mobility: Bed Mobility Overal bed mobility: Needs Assistance Bed Mobility: Rolling;Sidelying to Sit;Sit to Sidelying Rolling: Min guard Sidelying to sit: Min assist Supine to sit: Supervision;HOB elevated Sit to supine: Supervision;HOB elevated Sit to sidelying: Min guard General bed mobility comments: verbal cues for hand  placement and sequencing to use bed rails. min A to move LEs EOB. pt req min A for trunk initiation and control for sidelying to sit. Transfers Overall transfer level: Needs assistance Equipment used: Rolling walker (2 wheeled) Transfers: Sit to/from Stand Sit to Stand: Min assist Stand pivot transfers: Mod assist;+2 physical assistance General transfer comment: cues for hand placement, assist for safety due to intermittent tremors and initially with decreased safety awareness Ambulation/Gait Ambulation/Gait assistance: Min assist;Max assist Ambulation Distance (Feet): 200 Feet Assistive device: Rolling walker (2 wheeled) Gait Pattern/deviations: Step-through pattern;Decreased stride length;Trunk flexed;Shuffle Gait velocity: slow; guarded for falls General Gait Details: pt amb 2 x 100 ft. pt's Bilat knees tremored and buckled and Bilat UEs tremored after 100 ft req max A to prevent fall and lower to seat. pt demo'd R UE tremor throughout the whole amb activity req hand held A to keep RW managed safely. pt amb with flexed knees unable to achieve approopriate knee extension despite verbal cues. min A throughout amb for pt support.     ADL: min to mod assist    Cognition: Cognition Overall Cognitive Status: Impaired/Different from baseline Orientation Level: Oriented to person;Oriented to place;Disoriented to time;Disoriented to situation Cognition Arousal/Alertness: Awake/alert Behavior During Therapy: WFL for tasks assessed/performed Overall Cognitive Status: Impaired/Different from baseline Area of Impairment: Safety/judgement;Following commands Orientation Level: Time Memory: Decreased short-term memory Following Commands: Follows one step commands with increased time;Follows one step commands inconsistently Safety/Judgement: Decreased awareness of safety General Comments: pt able to recognize having to urinate. catheter fell off while standing; pt required standing position to use  urinal  Physical Exam: Blood pressure 128/96, pulse 77, temperature 97.3 F (36.3 C), temperature source Oral, resp. rate 16, height 5' 8.11" (1.73 m), weight 119 lb 11.4 oz (54.3 kg), SpO2 100.00%. Physical Exam Constitutional:  58 year old male appearing older than stated age.  HENT:  Poor dentition/ oral mucosa pink/moist Eyes: EOM are normal.  Neck: Normal range of motion. Neck supple. No thyromegaly present.  Cardiovascular: Normal rate and regular rhythm. No murmur Respiratory: Effort normal and breath sounds normal. No respiratory distress. No wheezes GI: Soft. Bowel sounds are normal. He exhibits no distension.  Neurological: He is alert.  Patient follows simple commands. He was able to provide his name and place. He was unsure of his age. Generally confused. Limited insight and awareness.   intention tremor bilateral lower extremities greater than bilateral upper extremities  Motor strength is 4+/5 bilateral deltoid, bicep, tricep, grip bilaterally. LE's 3+ prox HF, 4- KE and 4/5 at ankles.  Skin: vitiligo diffusely, diffuse maculopapular drug rash Psych: calm, non-distressed   Results for orders placed during the hospital encounter of 06/07/13 (from the past 48 hour(s))  BASIC METABOLIC PANEL     Status: Abnormal   Collection Time    06/19/13  1:16 PM      Result Value Ref Range   Sodium 129 (*) 137 - 147 mEq/L   Potassium 5.0  3.7 - 5.3 mEq/L   Chloride 89 (*) 96 - 112 mEq/L   CO2 22  19 - 32 mEq/L   Glucose, Bld 296 (*) 70 - 99 mg/dL   BUN 16  6 -  23 mg/dL   Creatinine, Ser 0.78  0.50 - 1.35 mg/dL   Calcium 9.0  8.4 - 10.5 mg/dL   GFR calc non Af Amer >90  >90 mL/min   GFR calc Af Amer >90  >90 mL/min   Comment: (NOTE)     The eGFR has been calculated using the CKD EPI equation.     This calculation has not been validated in all clinical situations.     eGFR's persistently <90 mL/min signify possible Chronic Kidney     Disease.  GLUCOSE, CAPILLARY     Status:  Abnormal   Collection Time    06/19/13  5:19 PM      Result Value Ref Range   Glucose-Capillary 245 (*) 70 - 99 mg/dL   Comment 1 Notify RN     Comment 2 Documented in Chart    GLUCOSE, CAPILLARY     Status: Abnormal   Collection Time    06/19/13 10:08 PM      Result Value Ref Range   Glucose-Capillary 173 (*) 70 - 99 mg/dL   Comment 1 Notify RN     Comment 2 Documented in Chart    GLUCOSE, CAPILLARY     Status: Abnormal   Collection Time    06/20/13  7:55 AM      Result Value Ref Range   Glucose-Capillary 140 (*) 70 - 99 mg/dL  GLUCOSE, CAPILLARY     Status: Abnormal   Collection Time    06/20/13 11:54 AM      Result Value Ref Range   Glucose-Capillary 242 (*) 70 - 99 mg/dL  GLUCOSE, CAPILLARY     Status: Abnormal   Collection Time    06/20/13  5:11 PM      Result Value Ref Range   Glucose-Capillary 209 (*) 70 - 99 mg/dL  GLUCOSE, CAPILLARY     Status: Abnormal   Collection Time    06/20/13  9:55 PM      Result Value Ref Range   Glucose-Capillary 209 (*) 70 - 99 mg/dL  GLUCOSE, CAPILLARY     Status: Abnormal   Collection Time    06/21/13  7:44 AM      Result Value Ref Range   Glucose-Capillary 141 (*) 70 - 99 mg/dL   No results found.  Post Admission Physician Evaluation: 1. Functional deficits secondary  to neurosyphilis. 2. Patient is admitted to receive collaborative, interdisciplinary care between the physiatrist, rehab nursing staff, and therapy team. 3. Patient's level of medical complexity and substantial therapy needs in context of that medical necessity cannot be provided at a lesser intensity of care such as a SNF. 4. Patient has experienced substantial functional loss from his/her baseline which was documented above under the "Functional History" and "Functional Status" headings.  Judging by the patient's diagnosis, physical exam, and functional history, the patient has potential for functional progress which will result in measurable gains while on inpatient  rehab.  These gains will be of substantial and practical use upon discharge  in facilitating mobility and self-care at the household level. 5. Physiatrist will provide 24 hour management of medical needs as well as oversight of the therapy plan/treatment and provide guidance as appropriate regarding the interaction of the two. 6. 24 hour rehab nursing will assist with bladder management, bowel management, safety, skin/wound care, disease management, medication administration, pain management and patient education  and help integrate therapy concepts, techniques,education, etc. 7. PT will assess and treat for/with: Lower extremity strength, range of  motion, stamina, balance, functional mobility, safety, adaptive techniques and equipment, cognitive perceptual awareness, visual-perceptual awareness, NMR.   Goals are: supervision. 8. OT will assess and treat for/with: ADL's, functional mobility, safety, upper extremity strength, adaptive techniques and equipment, NMR, cognitive perceptual awareness, visual-perceptual awareness.   Goals are: supervision. 9. SLP will assess and treat for/with: cognition, communication.  Goals are: supervision to min assist. 10. Case Management and Social Worker will assess and treat for psychological issues and discharge planning. 11. Team conference will be held weekly to assess progress toward goals and to determine barriers to discharge. 12. Patient will receive at least 3 hours of therapy per day at least 5 days per week. 13. ELOS: 12-16 days       14. Prognosis:  excellent   Medical Problem List and Plan: 1. Neurosyphilis, newly diagnosed HIV. Followup per infectious disease 2. DVT Prophylaxis/Anticoagulation: Subcutaneous heparin. Monitor platelet counts and any signs of bleeding 3. Pain Management: Tylenol as needed  4. Mood/depression: Lexapro 5 mg daily. Followup per psychiatry services 5. Neuropsych: This patient is not capable of making decisions on his own  behalf. 6. Urticarial drug rash-felt to be secondary penicillin. Rash improving prednisone taper. Contact precautions for open lesions. Monitor blood sugars while on Decadron with no history of diabetes mellitus 7. COPD. Check oxygen saturations every shift 8. Heart block status post pacemaker. Chest pain or shortness of breath 9. Hypertension. Tenormin 25 mg daily. Monitor with increased mobility  [name], MD, [facility]  06/21/2013

## 2013-06-21 NOTE — Interval H&P Note (Signed)
Shaun Lutz was admitted today to Inpatient Rehabilitation with the diagnosis of neurosyphilis.  The patient's history has been reviewed, patient examined, and there is no change in status.  Patient continues to be appropriate for intensive inpatient rehabilitation.  I have reviewed the patient's chart and labs.  Questions were answered to the patient's satisfaction.  [name] 06/21/2013, 8:22 PM

## 2013-06-21 NOTE — Progress Notes (Signed)
I met with pt at bedside. He is in agreement to admission to inpt rehab today. I contacted his girlfriend, Inez Catalina, by phone and she is in agreement. I will arrange for today. RN CM is aware. [phone]

## 2013-06-21 NOTE — Discharge Summary (Signed)
Seen and examined.  Discussed with Dr. Skeet Simmer.  Agree with her DC, management and documentation.

## 2013-06-21 NOTE — Progress Notes (Addendum)
Joiner for Infectious Disease     11 days of  of full 24 MU of IV PCN 2 days  of ceftriaxone)  He pulled his IV out today  Subjective:  Rash seems stable he does not recall pulling iv out  Antibiotics:  Anti-infectives   Start     Dose/Rate Route Frequency Ordered Stop   06/22/13 0800  dolutegravir (TIVICAY) tablet 50 mg     50 mg Oral Daily 06/21/13 1619     06/22/13 0800  emtricitabine-tenofovir (TRUVADA) 200-300 MG per tablet 1 tablet     1 tablet Oral Daily 06/21/13 1619        Medications: Scheduled Meds: . [START ON 06/22/2013] aspirin  81 mg Oral Daily  . [START ON 06/22/2013] atenolol  25 mg Oral Daily  . [START ON 06/22/2013] dolutegravir  50 mg Oral Daily  . [START ON 06/22/2013] emtricitabine-tenofovir  1 tablet Oral Daily  . escitalopram  5 mg Oral QHS  . feeding supplement (ENSURE COMPLETE)  237 mL Oral TID  . heparin  5,000 Units Subcutaneous 3 times per day  . insulin aspart  0-5 Units Subcutaneous QHS  . [START ON 06/22/2013] predniSONE  20 mg Oral Q breakfast   Continuous Infusions:   PRN Meds:.    Objective: Weight change:  No intake or output data in the 24 hours ending 06/21/13 1920 Blood pressure 114/76, pulse 73, temperature 97.7 F (36.5 C), temperature source Oral, resp. rate 16, height 5\' 8"  (1.727 m), weight 128 lb 4.8 oz (58.196 kg), SpO2 99.00%. Temp:  [94.7 F (34.8 C)-97.7 F (36.5 C)] 97.7 F (36.5 C) (04/22 1615) Pulse Rate:  [67-77] 73 (04/22 1615) Resp:  [16-18] 16 (04/22 1615) BP: (108-130)/(71-96) 114/76 mmHg (04/22 1615) SpO2:  [94 %-100 %] 99 % (04/22 1615) Weight:  [128 lb 4.8 oz (58.196 kg)] 128 lb 4.8 oz (58.196 kg) (04/22 1619)  Physical Exam: AO person but not situation HEENT: anicteric sclera,  CVS regular rate, normal r, no murmur rubs or gallops  Chest: clear to auscultation bilaterally, no wheezing, rales or rhonchi  Abdomen: soft nontender, nondistended, normal bowel sounds,   Skin:  hyperpigmented lesions seen below and pictures            CBC:  Recent Labs Lab 06/15/13 1500 06/19/13 0626 06/21/13 1750  HGB 11.5* 12.0* 11.3*  HCT 35.4* 35.9* 34.2*  PLT 255 294 356     BMET  Recent Labs  06/19/13 0626 06/19/13 1316 06/21/13 1750  NA 126* 129*  --   K 5.4* 5.0  --   CL 89* 89*  --   CO2 25 22  --   GLUCOSE 117* 296*  --   BUN 14 16  --   CREATININE 0.86 0.78 0.86  CALCIUM 8.6 9.0  --      Liver Panel   Recent Labs  06/19/13 0626  PROT 7.5  ALBUMIN 2.4*  AST 35  ALT 49  ALKPHOS 86  BILITOT 0.3       Sedimentation Rate No results found for this basename: ESRSEDRATE,  in the last 72 hours C-Reactive Protein No results found for this basename: CRP,  in the last 72 hours  Micro Results: Recent Results (from the past 240 hour(s))  MRSA PCR SCREENING     Status: None   Collection Time    06/19/13  5:15 AM      Result Value Ref Range Status   MRSA by PCR  NEGATIVE  NEGATIVE Final   Comment:            The GeneXpert MRSA Assay (FDA     approved for NASAL specimens     only), is one component of a     comprehensive MRSA colonization     surveillance program. It is not     intended to diagnose MRSA     infection nor to guide or     monitor treatment for     MRSA infections.    Studies/Results: No results found.    Assessment/Plan:  Active Problems:   HIV (human immunodeficiency virus infection)    Shaun Lutz is a 58 y.o. 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 Now with apparent worsening of rash concerning for PCN allergy overnight sp IV corticosteroids in transfer to step down unit change some IV penicillin ceftriaxone   #1 Neurosyphilis:   Sp 13 days of IV therapy (he  Pulled the IV out today) w  He will need followup serial RPR and likely repeat LP with VDRL wbc, cell count and diff, protein and glucose per CDC guidelines (none  of that would be during this admissioN)  GIVEn concerne for PCN induced rash would not give him post IV , IM PCN anymore  #2 Rash: complicated pt who already had pruritic rash last week, prior vitiligo.  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 He is NOT competent and this was confirmed by psychiatry. He will likely need placment long term as I dont think he will be cabpable of caring for himself  #4 Depression and SI: Perhaps best NOT to continually bring up his diagnoses since he apparently forgets yet again, Hopefully there is some improvement in his cognition he is on SSRI   I will arrange for HSFU for him  I DO NOT THINK HE CAN BE DC TO HOME HE IS GOING TO [place] to take his ARV which he will requrie for the REST OF HIS LIFE  I will sign off for now  Please call back with further questions.    LOS: 0 days   Truman Hayward 06/21/2013, 7:20 PM

## 2013-06-21 NOTE — Progress Notes (Signed)
Patient arrived from [facility] with RN and sitter at bedside. Explained rehab process, fall prevention plan, safety plan, call bell system with verbal understanding. Patient arrived in soiled gowns, sitter at bedside (suicide precautions), assisted patient to wash up and put clean gown on. Patient proceeded to have BM in bedpan and was continent of urine. Patient has no c/o pain but has c/o itching. Spoke with Marlowe Shores, PA to receive order for Atarax 10mg  PRN TID for itching. Patient resting comfortably in bed, will continue to monitor.

## 2013-06-21 NOTE — Progress Notes (Signed)
Seen and examined.  Discussed with Dr. Marsh.  Agree with her management and documentation. 

## 2013-06-21 NOTE — H&P (View-Only) (Signed)
Physical Medicine and Rehabilitation Admission H&P    Chief Complaint  Patient presents with  . Shaking  : HPI: Shaun Lutz is a 58 y.o. 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 He was admitted via Neurology office on 06/07/13 with positive HIV as well as syphilis and concerns of neurosyphilis. Lumbar puncture showed white blood cell count of 86 with 60% segmented neutrophils 35% lymphocytes. CSF protein greater than 600 and glucose of 7. Gram stain negative for any organisms and cryptococcal antigen on CSF was negative. CSF viral culture pending. 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 Dr. Tommy Medal consulted and recommended 14 days of high-dose penicillin and 3 doses of weekly PCN. Patient has had difficulty accepting diagnosis with high levels of anxiety and passive suicidal ideation. Dr. Adele Schilder consulted for input and recommended Lexapro for depressed mood. Patient with poor attention, impaired concentration as well as poor memory--he is not felt to have capacity to participate in his treatment plan. He developed urticarial rash likely due to drug reaction--PCN changed to ceftriaxone and STRIBILD d/c. He was started on TIVICAY and TRUVADA on 06/20/13. He remains confused and disoriented with poor concentration and slow/loose thought process. Physical occupational therapy ongoing. Patient was admitted for a comprehensive rehabilitation program   ROS Review of Systems  HENT: Positive for hearing loss.  Cardiovascular: Positive for palpitations.  Musculoskeletal: Positive for falls.  Neurological: Positive for weakness.  Syncope  All other systems reviewed and are negative  Past Medical History  Diagnosis Date  . Hypertension   . Second degree AV block   . Syncope   . Pacemaker -[employer]     DOI 2012   Past  Surgical History  Procedure Laterality Date  . Pacemaker insertion     Family History  Problem Relation Age of Onset  . Cancer Mother   . Heart disease Father   . Heart disease Sister   . Heart disease Brother    Social History:  reports that he has been smoking Cigarettes.  He has a 8.75 pack-year smoking history. He does not have any smokeless tobacco history on file. He reports that he does not drink alcohol or use illicit drugs. Allergies:  Allergies  Allergen Reactions  . Penicillins    Medications Prior to Admission  Medication Sig Dispense Refill  . aspirin 81 MG tablet Take 1 tablet (81 mg total) by mouth daily.  180 tablet  2  . atenolol (TENORMIN) 25 MG tablet Take 25 mg by mouth daily.      Marland Kitchen lisinopril (PRINIVIL,ZESTRIL) 5 MG tablet Take 1 tablet (5 mg total) by mouth daily.  90 tablet  3  . Nutritional Supplements (ENSURE ACTIVE HIGH PROTEIN) LIQD Take 1 Can by mouth 3 (three) times daily.  60 Can  2  . carbidopa-levodopa (SINEMET IR) 25-100 MG per tablet Take 1 tablet by mouth 3 (three) times daily.  90 tablet  5    Home: Home Living Family/patient expects to be discharged to:: Private residence Living Arrangements: Non-relatives/Friends Available Help at Discharge: Other (Comment) (girlfriend) Type of Home: House Home Access: Stairs to enter [employer] of Steps: 4 Entrance Stairs-Rails: Right Home Layout: One level Home Equipment: None   Functional History: Prior Function Level of Independence: Independent Comments: states girlfriend is disabled and has an aide that does cleaning,  cooking, etc  Functional Status:  Mobility: Bed Mobility Overal bed mobility: Needs Assistance Bed Mobility: Rolling;Sidelying to Sit;Sit to Sidelying Rolling: Min guard Sidelying to sit: Min assist Supine to sit: Supervision;HOB elevated Sit to supine: Supervision;HOB elevated Sit to sidelying: Min guard General bed mobility comments: verbal cues for hand  placement and sequencing to use bed rails. min A to move LEs EOB. pt req min A for trunk initiation and control for sidelying to sit. Transfers Overall transfer level: Needs assistance Equipment used: Rolling walker (2 wheeled) Transfers: Sit to/from Stand Sit to Stand: Min assist Stand pivot transfers: Mod assist;+2 physical assistance General transfer comment: cues for hand placement, assist for safety due to intermittent tremors and initially with decreased safety awareness Ambulation/Gait Ambulation/Gait assistance: Min assist;Max assist Ambulation Distance (Feet): 200 Feet Assistive device: Rolling walker (2 wheeled) Gait Pattern/deviations: Step-through pattern;Decreased stride length;Trunk flexed;Shuffle Gait velocity: slow; guarded for falls General Gait Details: pt amb 2 x 100 ft. pt's Bilat knees tremored and buckled and Bilat UEs tremored after 100 ft req max A to prevent fall and lower to seat. pt demo'd R UE tremor throughout the whole amb activity req hand held A to keep RW managed safely. pt amb with flexed knees unable to achieve approopriate knee extension despite verbal cues. min A throughout amb for pt support.     ADL: min to mod assist    Cognition: Cognition Overall Cognitive Status: Impaired/Different from baseline Orientation Level: Oriented to person;Oriented to place;Disoriented to time;Disoriented to situation Cognition Arousal/Alertness: Awake/alert Behavior During Therapy: WFL for tasks assessed/performed Overall Cognitive Status: Impaired/Different from baseline Area of Impairment: Safety/judgement;Following commands Orientation Level: Time Memory: Decreased short-term memory Following Commands: Follows one step commands with increased time;Follows one step commands inconsistently Safety/Judgement: Decreased awareness of safety General Comments: pt able to recognize having to urinate. catheter fell off while standing; pt required standing position to use  urinal  Physical Exam: Blood pressure 128/96, pulse 77, temperature 97.3 F (36.3 C), temperature source Oral, resp. rate 16, height 5' 8.11" (1.73 m), weight 119 lb 11.4 oz (54.3 kg), SpO2 100.00%. Physical Exam Constitutional:  58 year old male appearing older than stated age.  HENT:  Poor dentition/ oral mucosa pink/moist Eyes: EOM are normal.  Neck: Normal range of motion. Neck supple. No thyromegaly present.  Cardiovascular: Normal rate and regular rhythm. No murmur Respiratory: Effort normal and breath sounds normal. No respiratory distress. No wheezes GI: Soft. Bowel sounds are normal. He exhibits no distension.  Neurological: He is alert.  Patient follows simple commands. He was able to provide his name and place. He was unsure of his age. Generally confused. Limited insight and awareness.   intention tremor bilateral lower extremities greater than bilateral upper extremities  Motor strength is 4+/5 bilateral deltoid, bicep, tricep, grip bilaterally. LE's 3+ prox HF, 4- KE and 4/5 at ankles.  Skin: vitiligo diffusely, diffuse maculopapular drug rash Psych: calm, non-distressed   Results for orders placed during the hospital encounter of 06/07/13 (from the past 48 hour(s))  BASIC METABOLIC PANEL     Status: Abnormal   Collection Time    06/19/13  1:16 PM      Result Value Ref Range   Sodium 129 (*) 137 - 147 mEq/L   Potassium 5.0  3.7 - 5.3 mEq/L   Chloride 89 (*) 96 - 112 mEq/L   CO2 22  19 - 32 mEq/L   Glucose, Bld 296 (*) 70 - 99 mg/dL   BUN 16  6 -  23 mg/dL   Creatinine, Ser 0.78  0.50 - 1.35 mg/dL   Calcium 9.0  8.4 - 10.5 mg/dL   GFR calc non Af Amer >90  >90 mL/min   GFR calc Af Amer >90  >90 mL/min   Comment: (NOTE)     The eGFR has been calculated using the CKD EPI equation.     This calculation has not been validated in all clinical situations.     eGFR's persistently <90 mL/min signify possible Chronic Kidney     Disease.  GLUCOSE, CAPILLARY     Status:  Abnormal   Collection Time    06/19/13  5:19 PM      Result Value Ref Range   Glucose-Capillary 245 (*) 70 - 99 mg/dL   Comment 1 Notify RN     Comment 2 Documented in Chart    GLUCOSE, CAPILLARY     Status: Abnormal   Collection Time    06/19/13 10:08 PM      Result Value Ref Range   Glucose-Capillary 173 (*) 70 - 99 mg/dL   Comment 1 Notify RN     Comment 2 Documented in Chart    GLUCOSE, CAPILLARY     Status: Abnormal   Collection Time    06/20/13  7:55 AM      Result Value Ref Range   Glucose-Capillary 140 (*) 70 - 99 mg/dL  GLUCOSE, CAPILLARY     Status: Abnormal   Collection Time    06/20/13 11:54 AM      Result Value Ref Range   Glucose-Capillary 242 (*) 70 - 99 mg/dL  GLUCOSE, CAPILLARY     Status: Abnormal   Collection Time    06/20/13  5:11 PM      Result Value Ref Range   Glucose-Capillary 209 (*) 70 - 99 mg/dL  GLUCOSE, CAPILLARY     Status: Abnormal   Collection Time    06/20/13  9:55 PM      Result Value Ref Range   Glucose-Capillary 209 (*) 70 - 99 mg/dL  GLUCOSE, CAPILLARY     Status: Abnormal   Collection Time    06/21/13  7:44 AM      Result Value Ref Range   Glucose-Capillary 141 (*) 70 - 99 mg/dL   No results found.  Post Admission Physician Evaluation: 1. Functional deficits secondary  to neurosyphilis. 2. Patient is admitted to receive collaborative, interdisciplinary care between the physiatrist, rehab nursing staff, and therapy team. 3. Patient's level of medical complexity and substantial therapy needs in context of that medical necessity cannot be provided at a lesser intensity of care such as a SNF. 4. Patient has experienced substantial functional loss from his/her baseline which was documented above under the "Functional History" and "Functional Status" headings.  Judging by the patient's diagnosis, physical exam, and functional history, the patient has potential for functional progress which will result in measurable gains while on inpatient  rehab.  These gains will be of substantial and practical use upon discharge  in facilitating mobility and self-care at the household level. 5. Physiatrist will provide 24 hour management of medical needs as well as oversight of the therapy plan/treatment and provide guidance as appropriate regarding the interaction of the two. 6. 24 hour rehab nursing will assist with bladder management, bowel management, safety, skin/wound care, disease management, medication administration, pain management and patient education  and help integrate therapy concepts, techniques,education, etc. 7. PT will assess and treat for/with: Lower extremity strength, range of  motion, stamina, balance, functional mobility, safety, adaptive techniques and equipment, cognitive perceptual awareness, visual-perceptual awareness, NMR.   Goals are: supervision. 8. OT will assess and treat for/with: ADL's, functional mobility, safety, upper extremity strength, adaptive techniques and equipment, NMR, cognitive perceptual awareness, visual-perceptual awareness.   Goals are: supervision. 9. SLP will assess and treat for/with: cognition, communication.  Goals are: supervision to min assist. 10. Case Management and Social Worker will assess and treat for psychological issues and discharge planning. 11. Team conference will be held weekly to assess progress toward goals and to determine barriers to discharge. 12. Patient will receive at least 3 hours of therapy per day at least 5 days per week. 13. ELOS: 12-16 days       14. Prognosis:  excellent   Medical Problem List and Plan: 1. Neurosyphilis, newly diagnosed HIV. Followup per infectious disease 2. DVT Prophylaxis/Anticoagulation: Subcutaneous heparin. Monitor platelet counts and any signs of bleeding 3. Pain Management: Tylenol as needed  4. Mood/depression: Lexapro 5 mg daily. Followup per psychiatry services 5. Neuropsych: This patient is not capable of making decisions on his own  behalf. 6. Urticarial drug rash-felt to be secondary penicillin. Rash improving prednisone taper. Contact precautions for open lesions. Monitor blood sugars while on Decadron with no history of diabetes mellitus 7. COPD. Check oxygen saturations every shift 8. Heart block status post pacemaker. Chest pain or shortness of breath 9. Hypertension. Tenormin 25 mg daily. Monitor with increased mobility  [name], MD, [place]  06/21/2013

## 2013-06-21 NOTE — Progress Notes (Signed)
Family Medicine Teaching Service Daily Progress Note Intern Pager: [phone]  Patient name: Shaun Lutz Medical record number: [id number] Date of birth: 13-Mar-1955 Age: 58 y.o. Gender: male  Primary Care Provider: Donnamae Jude, MD Consultants: ID, neuro Code Status: full  Pt Overview and Major Events to Date: Shaun Lutz is a 58 y.o. 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 Lucianne Lei Dam's note 4/11: soiled himself overnight, awoke confused, so Zoloft discontinued; otherwise little change 4/13: severe panic attacks, perseverative, ativan 1mg  4/14: HIV med assistance and HIV genomic studies 4/15: CM/SW and PT/OT to eval regarding placement 4/16: 1:1 for passive suicidal ideation, discussion re placement, consideration of CIR 4/17: formal CIR evaluation, Psych to assess capacity, started ARV therapy 4/19: pt developed worsening urticaria, involving face, transferred to SDU placed on IV solumedrol 4/20: consulted ID for eval; d/c ARV/PCNG, start high dose CTX 4/21: may start new ARV; skin breakdown extensive, seems to be tolerating CTX/sitter added at night time 4/22: will f/up with placement today  Assessment and Plan: Shaun Lutz is a 58 y.o. 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 PMH is significant for HTN, second degree AV block s/p pacemaker placement, vitiligo, recent dx HIV/syphilis  #Skin- Urticarial rash/excoriations  and possible drug reaction; vitiligo -monitor closely for need to stop CTX -cont barrier cream -PO prednisone 20mg  -cont loratadine   #Psych/Social Suicidal ideation, lacks capacity, replaced 1:1 sitter; likely component of sundowning/night time delirium; maximize day/evening cues  - will add evening dose scheduled haldol 0.5mg  - cont lexapro 5mg  - appreciate psych assistance  # ID- HIV positive, Neurosyphilis  Now with possible delayed hypersensitivity reaction involving face, upper extremities (see above) -Started on ARV 4/17-4/19 (STRIBILD); started TIVICAY and TRUVADA 4/21 -PenGIV (4/9-4/19) d/c'd 2/2 above, started high dose CTX (4/20-) hopeful to complete 14 day course for neurosyph; day 14 today -ID following greatly appreciate recs and management -monitor closely for development of mucosal involvement -vitals per floor to eval for signs of resp compromise -need f/up with serial RPR, repeat LP with VDRL etc per ID  #Neuro - Gait abnormality, hearing loss, waxing/waning dementia DDx includes sequelae of HIV/syphilis vs parkinsonian features vs demyelinating d/o.Likely patient having features related to progression of infectious disease (see above).  -work up as above with ID -neuro signed off, plan to f/up as outpt -PT/OT evals both recommending CIR,working towards home with [facility] services following vs SNF  #CV- HTN/second degree AV block s/p pacemaker placement 2012.  2D echo 4/10 grossly normal - cont atenolol, pacer interrogated; consider formal cards consult if needed - cont ASA   #Pulm- COPD? Bullous changes seen on CT chest with bilat infiltrates; will monitor for signs of airway changes with possible sensitivity to PCNG -no prescriptions for COPD currently  -vitals per floor protocol, will cont to monitor with pulse ox   #FEN/GI/Endo: hyponatremia improving with IVF; heart healthy diet, CBG 150s-250s; last dose prednisone  -cont SSI  -replete electrolytes prn -hold PO if  patient anxious  Disposition:further  ID recs re-repeat studies/ARVs; working towards dispo for CIR vs SNF, plan to re-eval today  Subjective:  Breathing comfortably, not reporting any itching, labile, hyperemotional  Objective: Temp:  [94.7 F (34.8 C)-97.4 F (36.3 C)] 97.3 F (36.3 C) (04/22 7829) Pulse Rate:  [66-98] 77 (04/22 0638) Resp:  [16-18] 16 (04/22 0638) BP: (100-130)/(67-96) 128/96 mmHg (04/22 0638) SpO2:  [95 %-100 %] 100 % (04/22 5621) Physical Exam: Gen: NAD; cooperative with exam this morning, confused  HEENT: poor dentition, shallow ulceration on right buccal mucosa improved CV: RRR without definite murmur  Resp: CTABL, no wheezes, non-labored, moving air Abd: thin, scaphoid, SNTND, BS present, no guarding or organomegaly  Ext: thin extremities, warm Skin: vitiligo, multiple hyperpigmented macules throughout entire body including palms and soles; scattered excoriations, multiple urticarial like patches scattered across upper and lower extremities, skin sloughing and break down worse in the buttocks region   Laboratory: utox neg  U/a neg apart from 2.0 protein  CSF gram stain no organism  Crypto ag neg  See results review and A/P for extensive CSF study results  Imaging/Diagnostic Tests: CT head 06/07/13 IMPRESSION:  1. Hypoattenuation in the left temporal tip appears parenchymal and  are likely related to ischemia of and an extra-axial arachnoid cyst.  2. Diffuse subcortical white matter hypoattenuation bilaterally is  stable. 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  3. Mild prominence the ventricles bilaterally is likely due to  atrophy. Mild hydrocephalus is considered less likely. Lumbar  puncture may be useful for further evaluation.   CT cervical spine 06/08/13 IMPRESSION:  1. No significant para meningeal focus of infection is identified.  2. Multilevel degenerative cervical spine disease as described.  3. Stable vertebral body heights and alignment.   4. Hypodense lesion within the right parotid gland measures 10 x 15  mm. This could represent a hyperdense lymph node associated with  HIV. However, no other significant adenopathy is present. A primary  parotid lesion is also considered.  5. Inguinal lymph nodes bilaterally are likely related to HIV.  6. Bilateral pleural effusions. Infection is not excluded.  7. Mass like area dependently in the left lower lobe likely  represents rounded atelectasis or potentially some fluid trapped  within the fissure.  8. 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   Langston Masker, MD 06/21/2013, 9:24 AM PGY-1, [place] Intern pager: [phone], text pages welcome

## 2013-06-21 NOTE — Progress Notes (Signed)
Pt admits suicidal ideation and stated to kill himself numerous times. MD notified.

## 2013-06-22 ENCOUNTER — Inpatient Hospital Stay (HOSPITAL_COMMUNITY): Payer: Medicare Other

## 2013-06-22 ENCOUNTER — Telehealth: Payer: Self-pay | Admitting: Family Medicine

## 2013-06-22 ENCOUNTER — Inpatient Hospital Stay (HOSPITAL_COMMUNITY): Payer: Self-pay

## 2013-06-22 DIAGNOSIS — R5381 Other malaise: Secondary | ICD-10-CM | POA: Diagnosis not present

## 2013-06-22 DIAGNOSIS — A523 Neurosyphilis, unspecified: Secondary | ICD-10-CM | POA: Diagnosis not present

## 2013-06-22 LAB — CBC WITH DIFFERENTIAL/PLATELET
BASOS PCT: 0 % (ref 0–1)
Basophils Absolute: 0 10*3/uL (ref 0.0–0.1)
EOS PCT: 10 % — AB (ref 0–5)
Eosinophils Absolute: 1 10*3/uL — ABNORMAL HIGH (ref 0.0–0.7)
HEMATOCRIT: 32.6 % — AB (ref 39.0–52.0)
Hemoglobin: 10.6 g/dL — ABNORMAL LOW (ref 13.0–17.0)
Lymphocytes Relative: 6 % — ABNORMAL LOW (ref 12–46)
Lymphs Abs: 0.6 10*3/uL — ABNORMAL LOW (ref 0.7–4.0)
MCH: 30.5 pg (ref 26.0–34.0)
MCHC: 32.5 g/dL (ref 30.0–36.0)
MCV: 93.7 fL (ref 78.0–100.0)
Monocytes Absolute: 0.4 10*3/uL (ref 0.1–1.0)
Monocytes Relative: 4 % (ref 3–12)
Neutro Abs: 7.7 10*3/uL (ref 1.7–7.7)
Neutrophils Relative %: 80 % — ABNORMAL HIGH (ref 43–77)
Platelets: 323 10*3/uL (ref 150–400)
RBC: 3.48 MIL/uL — ABNORMAL LOW (ref 4.22–5.81)
RDW: 13.7 % (ref 11.5–15.5)
WBC: 9.6 10*3/uL (ref 4.0–10.5)

## 2013-06-22 LAB — GLUCOSE, CAPILLARY
GLUCOSE-CAPILLARY: 178 mg/dL — AB (ref 70–99)
Glucose-Capillary: 85 mg/dL (ref 70–99)
Glucose-Capillary: 132 mg/dL — ABNORMAL HIGH (ref 70–99)
Glucose-Capillary: 157 mg/dL — ABNORMAL HIGH (ref 70–99)
Glucose-Capillary: 234 mg/dL — ABNORMAL HIGH (ref 70–99)

## 2013-06-22 LAB — COMPREHENSIVE METABOLIC PANEL
ALT: 26 U/L (ref 0–53)
AST: 15 U/L (ref 0–37)
Albumin: 2.1 g/dL — ABNORMAL LOW (ref 3.5–5.2)
Alkaline Phosphatase: 64 U/L (ref 39–117)
BUN: 31 mg/dL — ABNORMAL HIGH (ref 6–23)
CALCIUM: 8.9 mg/dL (ref 8.4–10.5)
CO2: 28 meq/L (ref 19–32)
CREATININE: 0.93 mg/dL (ref 0.50–1.35)
Chloride: 106 mEq/L (ref 96–112)
GFR calc Af Amer: >90 mL/min (ref >90)
Glucose, Bld: 115 mg/dL — ABNORMAL HIGH (ref 70–99)
Potassium: 4.6 mEq/L (ref 3.7–5.3)
Sodium: 143 mEq/L (ref 137–147)
Total Bilirubin: <0.2 mg/dL — ABNORMAL LOW (ref 0.3–1.2)
Total Protein: 6.7 g/dL (ref 6.0–8.3)

## 2013-06-22 NOTE — Telephone Encounter (Signed)
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 I sent a message to Dr Albertina Senegal who is listed as his current attending about test result and suggested if necessary to please have patient repeat test. Patient is already seeing infectious dx specialist for his HIV status.

## 2013-06-22 NOTE — Progress Notes (Signed)
Occupational Therapy Session Note  Patient Details  Name: Shaun Lutz MRN: [id number] Date of Birth: 04/30/1955  Today's Date: 06/22/2013 Time: 1400-1430 Time Calculation (min): 30 min  Short Term Goals: Week 1:  OT Short Term Goal 1 (Week 1): STG=LTG due to anticipated briefl length of stay OT Short Term Goal 2 (Week 1): Pateint will complete upper body bathing sitting at sink with   Skilled Therapeutic Interventions/Progress Updates:  Pt seated in w/c upon arrival.  Pt engaged in standing tasks at sink to include grooming tasks. Pt transitioned to functional amb with RW in room for simple home mgmt tasks.  Discussed LTGs and discharge planning.  Pt exhibited significant intention BUE tremors while ambulating and standing at sink.  Pt exhibited difficulty events of day with other therapists.  Focus on activity tolerance, safety awareness, functional amb with RW, and dynamic standing balance. Therapy Documentation Precautions:  Precautions Precautions: Fall Precaution Comments: body tremors during functional mobility  Restrictions Weight Bearing Restrictions: No Pain: Pain Assessment Pain Assessment: No/denies painPt denies pain  See FIM for current functional status  Therapy/Group: Individual Therapy  Leroy Libman 06/22/2013, 2:58 PM

## 2013-06-22 NOTE — Evaluation (Signed)
Speech Language Pathology Assessment and Plan  Patient Details  Name: Shaun Lutz MRN: [id number] Date of Birth: [birth year]  SLP Diagnosis: Cognitive Impairments  Rehab Potential: Good ELOS: 7-10 days   Today's Date: 06/22/2013 Time: 1300-1400 Time Calculation (min): 60 min  Problem List:  Patient Active Problem List   Diagnosis Date Noted  . HIV (human immunodeficiency virus infection) 06/21/2013  . Malnutrition of moderate degree 06/08/2013  . Human immunodeficiency virus (HIV) disease 06/07/2013  . Syphilis in male 06/07/2013  . Lower extremity weakness 06/07/2013  . Syphilis 06/07/2013  . Abnormality of gait 05/22/2013  . Decreased hearing of both ears 11/23/2012  . Hypotension 08/22/2012  . Physical deconditioning 08/10/2012  . Underweight 08/10/2012  . Occasional tremors 07/08/2012  . At [place] for Falls 07/08/2012  . Pacemaker- medtronic 05/13/2012  . SYNCOPE 04/07/2010  . OTHER&UNSPECIFIED ALCOHOL DEPENDENCE REMISSION 03/05/2010  . COPD 03/05/2010  . VITILIGO 03/05/2010  . ATRIOVENTRICULAR BLOCK, MOBITZ TYPE II 03/05/2010   Past Medical History:  Past Medical History  Diagnosis Date  . Hypertension   . Second degree AV block   . Syncope   . Pacemaker -Medtronic     DOI 2012   Past Surgical History:  Past Surgical History  Procedure Laterality Date  . Pacemaker insertion      Assessment / Plan / Recommendation Clinical Impression Pt is a 58 y.o. 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 He was admitted via Neurology office on 06/07/13 with positive HIV as well as syphilis and concerns of neurosyphilis. Lumbar puncture showed white blood cell count of 86 with 60% segmented neutrophils 35% lymphocytes. CSF protein greater than 600 and glucose of 7. Gram stain negative for any organisms and cryptococcal antigen on CSF was negative. CSF viral culture pending. 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 Dr. Tommy Medal consulted and recommended 14 days of high-dose penicillin and 3 doses of weekly PCN. Pt has had difficulty accepting diagnosis with high levels of anxiety and passive suicidal ideation. Dr. Adele Schilder consulted for input and recommended Lexapro for depressed mood. Pt with poor attention, impaired concentration as well as poor memory--he is not felt to have capacity to participate in his treatment plan. He developed urticarial rash likely due to drug reaction--PCN changed to ceftriaxone and STRIBILD d/c. He was started on TIVICAY and TRUVADA on 06/20/13. He remains confused and disoriented with poor concentration and slow/loose thought process. PT/OT ongoing. Pt was admitted for a comprehensive rehabilitation program 4/22.   SLP cognitive-linguistic evaluation reveals sustained attention that appears to be improving, although pt with persistent disorientation to time and situation. He has significantly impaired recall of new information and working memory, impacting his ability to complete multistep instructions and perform basic-mildly complex problem solving tasks. Pt overall has decreased intellectual awareness regarding current deficits. Pt will benefit from skilled SLP services to maximize cognitive function and decrease caregiver burden prior to discharge.   Skilled Therapeutic Interventions          SLP bedside swallow and cognitive linguistic evaluations were administered with results and recommendations reviewed with patient.   SLP Assessment  Patient will need skilled [place] Pathology Services during CIR admission    Recommendations  Patient destination: Home Follow up Recommendations: Home Health SLP;Outpatient SLP;24 hour supervision/assistance Equipment Recommended: None recommended by SLP  SLP Frequency 5 out of 7 days   SLP  Treatment/Interventions Cognitive remediation/compensation;Cueing hierarchy;Environmental controls;Functional tasks;Internal/external aids;Patient/family education    Pain Pain Assessment Pain Assessment: No/denies pain Prior Functioning Cognitive/Linguistic Baseline: Baseline deficits Baseline deficit details: impaired memory and disorientation to year per OP Neurology note Type of Home: House  Lives With: Significant other Available Help at Discharge: Other (Comment) (significant other) Vocation: On disability  Short Term Goals: Week 1: SLP Short Term Goal 1 (Week 1): STGs = LTGs due to anticipated brief length of stay  See FIM for current functional status Refer to Care Plan for Long Term Goals  Recommendations for other services: None  Discharge Criteria: Patient will be discharged from SLP if patient refuses treatment 3 consecutive times without medical reason, if treatment goals not met, if there is a change in medical status, if patient makes no progress towards goals or if patient is discharged from hospital.  The above assessment, treatment plan, treatment alternatives and goals were discussed and mutually agreed upon: by patient   Germain Osgood, M.A. CCC-SLP [phone]  Germain Osgood 06/22/2013, 3:11 PM

## 2013-06-22 NOTE — Progress Notes (Signed)
Patient information reviewed and entered into eRehab system by Caedmon Louque, RN, CRRN, PPS Coordinator.  Information including medical coding and functional independence measure will be reviewed and updated through discharge.     Per nursing patient was given "Data Collection Information Summary for Patients in Inpatient Rehabilitation Facilities with attached "Privacy Act Statement-Health Care Records" upon admission.  

## 2013-06-22 NOTE — Care Management Note (Signed)
Inpatient Branch Individual Statement of Services  Patient Name:  Shaun Lutz  Date:  06/22/2013  Welcome to the [facility].  Our goal is to provide you with an individualized program based on your diagnosis and situation, designed to meet your specific needs.  With this comprehensive rehabilitation program, you will be expected to participate in at least 3 hours of rehabilitation therapies Monday-Friday, with modified therapy programming on the weekends.  Your rehabilitation program will include the following services:   Physical Therapy (PT), Occupational Therapy (OT), Speech Therapy (ST), 24 hour per day rehabilitation nursing, Therapeutic Recreaction (TR), Neuropsychology, Case Management (Social Worker), Rehabilitation Medicine, Nutrition Services and Pharmacy Services  Weekly team conferences will be held on Tuesdays to discuss your progress.  Your Social Worker will talk with you frequently to get your input and to update you on team discussions.  Team conferences with you and your family in attendance may also be held.  Expected length of stay: 7 - 10 days   Overall anticipated outcome: supervision  Depending on your progress and recovery, your program may change. Your Social Worker will coordinate services and will keep you informed of any changes. Your Social Worker's name and contact numbers are listed  below.  The following services may also be recommended but are not provided by the [facility] will be made to provide these services after discharge if needed.  Arrangements include referral to agencies that provide these services.  Your insurance has been verified to be:  Medicare Your primary doctor is:  Darron Doom  Pertinent information will be shared with your doctor and your insurance company.  Social  Worker:  Lennart Pall, [place] or ([phone]   Information discussed with and copy given to patient by: Lennart Pall, 06/22/2013, 3:05 PM

## 2013-06-22 NOTE — Telephone Encounter (Signed)
It is Dr. Naaman Plummer, not Albertina Senegal.  I will review with ID...thank you

## 2013-06-22 NOTE — Evaluation (Signed)
Physical Therapy Assessment and Plan  Patient Details  Name: Shaun Lutz MRN: [id number] Date of Birth: [birth year]  PT Diagnosis: Abnormality of gait, Coordination disorder, Impaired cognition and Muscle weakness, Decreased balance, Impaired skin integrity  Rehab Potential: Good ELOS: 7-10days   Today's Date: 06/22/2013 Time: 0830-0930 Time Calculation (min): 60 min  Problem List:  Patient Active Problem List   Diagnosis Date Noted  . HIV (human immunodeficiency virus infection) 06/21/2013  . Malnutrition of moderate degree 06/08/2013  . Human immunodeficiency virus (HIV) disease 06/07/2013  . Syphilis in male 06/07/2013  . Lower extremity weakness 06/07/2013  . Syphilis 06/07/2013  . Abnormality of gait 05/22/2013  . Decreased hearing of both ears 11/23/2012  . Hypotension 08/22/2012  . Physical deconditioning 08/10/2012  . Underweight 08/10/2012  . Occasional tremors 07/08/2012  . At [place] for Falls 07/08/2012  . Pacemaker- medtronic 05/13/2012  . SYNCOPE 04/07/2010  . OTHER&UNSPECIFIED ALCOHOL DEPENDENCE REMISSION 03/05/2010  . COPD 03/05/2010  . VITILIGO 03/05/2010  . ATRIOVENTRICULAR BLOCK, MOBITZ TYPE II 03/05/2010    Past Medical History:  Past Medical History  Diagnosis Date  . Hypertension   . Second degree AV block   . Syncope   . Pacemaker -Medtronic     DOI 2012   Past Surgical History:  Past Surgical History  Procedure Laterality Date  . Pacemaker insertion      Assessment & Plan Clinical Impression: Shaun Lutz is a 58 y.o. 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 He was admitted via Neurology office on 06/07/13 with positive HIV as well as syphilis and concerns of neurosyphilis. Lumbar puncture showed white blood cell count of 86 with 60% segmented neutrophils 35% lymphocytes. CSF protein greater than 600 and glucose of 7. Gram stain negative for any organisms and cryptococcal  antigen on CSF was negative. CSF viral culture pending. 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 Dr. Tommy Medal consulted and recommended 14 days of high-dose penicillin and 3 doses of weekly PCN. Patient has had difficulty accepting diagnosis with high levels of anxiety and passive suicidal ideation. Dr. Adele Schilder consulted for input and recommended Lexapro for depressed mood. Patient with poor attention, impaired concentration as well as poor memory--he is not felt to have capacity to participate in his treatment plan. He developed urticarial rash likely due to drug reaction--PCN changed to ceftriaxone and STRIBILD d/c. He was started on TIVICAY and TRUVADA on 06/20/13. He remains confused and disoriented with poor concentration and slow/loose thought process. Physical occupational therapy ongoing. Patient was admitted for a comprehensive rehabilitation program. Patient transferred to CIR on 06/21/2013 .   Patient currently requires min with mobility secondary to muscle weakness, decreased cardiorespiratoy endurance, decreased coordination and B UE/LE intention tremors, decreased awareness, decreased memory and delayed processing and decreased standing balance and decreased balance strategies.  Prior to hospitalization, patient was independent  with mobility and lived with Significant other in a House home.  Home access is 5Stairs to enter.  Patient will benefit from skilled PT intervention to maximize safe functional mobility, minimize fall risk and decrease caregiver burden for planned discharge home with 24 hour supervision.  Anticipate patient will benefit from follow up [place] at discharge.  PT - End of Session Activity Tolerance: Tolerates 30+ min activity with multiple rests Endurance Deficit: Yes PT Assessment Rehab Potential: Good PT Patient  demonstrates impairments in the following area(s):  Balance;Endurance;Motor;Safety;Other (comment);Skin Integrity (strength) PT Transfers Functional Problem(s): Bed Mobility;Bed to Chair;Car;Furniture;Floor PT Locomotion Functional Problem(s): Stairs;Wheelchair Mobility;Ambulation PT Plan PT Intensity: Minimum of 1-2 x/day ,45 to 90 minutes PT Frequency: 5 out of 7 days PT Duration Estimated Length of Stay: 7-10days PT Treatment/Interventions: Ambulation/gait training;Community reintegration;DME/adaptive equipment instruction;Neuromuscular re-education;Stair training;UE/LE Strength taining/ROM;Wheelchair propulsion/positioning;UE/LE Coordination activities;Therapeutic Activities;Skin care/wound management;Pain management;Discharge planning;Balance/vestibular training;Cognitive remediation/compensation;Disease management/prevention;Functional mobility training;Patient/family education;Therapeutic Exercise;Psychosocial support PT Transfers Anticipated Outcome(s): Supervision PT Locomotion Anticipated Outcome(s): Mod(I) for w/c propulsion; Supervision for standing mobility PT Recommendation Recommendations for Other Services: Neuropsych consult Follow Up Recommendations: Home health PT Patient destination: Home Equipment Recommended: To be determined  Skilled Therapeutic Intervention 1:1. Pt received supine in bed, ready for therapy. PT evaluation performed, see detailed information below. Pt educated on rehab environment, role of therapies, goals for physical therapy during pt's LOS and general safety plan. Pt verbalized understanding. Tx initiated w/ emphasis on safety during functional transfers, posture, ambulation and wheelchair propulsion. Pt sitting in w/c at end of session w/ all needs in reach, sitter in room.   PT Evaluation Precautions/Restrictions Precautions Precautions: Fall Precaution Comments: body tremors during functional mobility  Restrictions Weight Bearing Restrictions: No General Chart Reviewed: Yes Family/Caregiver  Present: No Vital SignsTherapy Vitals Temp: 97.6 F (36.4 C) Temp src: Oral Pulse Rate: 63 Resp: 18 BP: 102/62 mmHg Patient Position, if appropriate: Sitting Oxygen Therapy SpO2: 100 % O2 Device: None (Room air) Pain Pain Assessment Pain Assessment: No/denies pain Pain Score: 0-No pain Home Living/Prior Functioning Home Living Available Help at Discharge: Other (Comment) Type of Home: House Home Access: Stairs to enter [employer] of Steps: 5 Entrance Stairs-Rails: Right Home Layout: One level Additional Comments: not sure  Lives With: Significant other (married but not living with spouse "Lakoti") Prior Function Level of Independence: Needs assistance with ADLs;Independent with gait;Independent with transfers Dressing: Minimal Grooming: Minimal  Able to Take Stairs?: Yes Driving: No Vocation: On disability Comments: Pt req occasional falls at home PTA Vision/Perception     Cognition Overall Cognitive Status: Impaired/Different from baseline Arousal/Alertness: Awake/alert Orientation Level: Oriented to person;Oriented to place;Disoriented to time;Disoriented to situation Attention: Sustained Sustained Attention: Appears intact Selective Attention: Impaired Selective Attention Impairment: Functional basic Memory: Impaired Memory Impairment: Storage deficit;Decreased recall of new information;Decreased short term memory Decreased Short Term Memory: Functional basic;Verbal basic Awareness: Impaired Awareness Impairment: Intellectual impairment Problem Solving: Impaired (suspect impacted by decreased working memory) Problem Solving Impairment: Investment banker, corporate Function: Writer: Impaired Organizing Impairment: Functional basic;Verbal complex Safety/Judgment: Impaired (pt verbalizes need to call for help prior to getting up, however question pt's ability to recall throughout the day) Comments: Decreased speed of  processing Sensation Sensation Light Touch: Appears Intact Stereognosis: Appears Intact Hot/Cold: Appears Intact Proprioception: Appears Intact Coordination Gross Motor Movements are Fluid and Coordinated: No Fine Motor Movements are Fluid and Coordinated: No Coordination and Movement Description: Intention tremors noted in all 4 extremities during mobility, moderate at R-UE, mild at L-UE Motor  Motor Motor: Other (comment) Motor - Skilled Clinical Observations: Generalized weakness, Intention tremor in B UE/LE  Mobility Bed Mobility Bed Mobility: Rolling Right;Right Sidelying to Sit Rolling Right: 4: Min assist Rolling Right Details: Verbal cues for sequencing;Verbal cues for technique;Verbal cues for precautions/safety Right Sidelying to Sit: 4: Min assist Right Sidelying to Sit Details: Manual facilitation for weight shifting;Verbal cues for sequencing;Verbal cues for technique;Verbal cues for precautions/safety Transfers Transfers: Yes Sit to Stand: 4: Min assist Sit to Stand  Details: Verbal cues for precautions/safety;Verbal cues for safe use of DME/AE;Tactile cues for posture Sit to Stand Details (indicate cue type and reason): verbal cues for hand placement Stand to Sit: 4: Min assist Stand to Sit Details (indicate cue type and reason): Verbal cues for precautions/safety;Verbal cues for safe use of DME/AE Stand Pivot Transfers: 4: Min assist Stand Pivot Transfer Details: Verbal cues for precautions/safety;Verbal cues for technique;Verbal cues for safe use of DME/AE Locomotion  Ambulation Ambulation: Yes Ambulation/Gait Assistance: 4: Min assist Ambulation Distance (Feet): 100 Feet Assistive device: Rolling walker Ambulation/Gait Assistance Details: Manual facilitation for weight shifting;Verbal cues for safe use of DME/AE;Tactile cues for posture;Verbal cues for technique;Verbal cues for gait pattern Gait Gait: Yes Gait Pattern: Impaired Gait Pattern: Decreased  hip/knee flexion - left;Decreased hip/knee flexion - right;Decreased weight shift to right;Decreased weight shift to left;Trunk flexed;Narrow base of support;Decreased step length - right;Decreased step length - left;Decreased stride length Gait velocity: Slow Stairs / Additional Locomotion Stairs: Yes Stairs Assistance: 4: Min assist Stairs Assistance Details: Verbal cues for precautions/safety Stair Management Technique: Two rails;Forwards Number of Stairs: Brewing technologist: Yes Wheelchair Assistance: 5: Investment banker, operational Details: Verbal cues for safe use of DME/AE;Verbal cues for technique;Verbal cues for Astronomer: Both upper extremities Wheelchair Parts Management: Needs assistance;Supervision/cueing Distance: 175'  Trunk/Postural Assessment  Cervical Assessment Cervical Assessment: Exceptions to [facility] (forward head) Thoracic Assessment Thoracic Assessment: Exceptions to [facility] (trunk flexion that progresses with fatigue) Lumbar Assessment Lumbar Assessment: Exceptions to [facility] (posterior pelvic tilt) Postural Control Postural Control: Deficits on evaluation Righting Reactions: delayed in standing  Balance Balance Balance Assessed: Yes Static Sitting Balance Static Sitting - Balance Support: Left upper extremity supported;Right upper extremity supported;Feet supported;Bilateral upper extremity supported Static Sitting - Level of Assistance: 5: Stand by assistance Dynamic Sitting Balance Dynamic Sitting - Balance Support: Right upper extremity supported;Left upper extremity supported;Bilateral upper extremity supported;Feet supported Dynamic Sitting - Level of Assistance: 4: Min assist;5: Stand by assistance Dynamic Sitting - Balance Activities: Forward lean/weight shifting;Lateral lean/weight shifting;Reaching for Consulting civil engineer Standing - Balance Support: Bilateral upper extremity  supported Static Standing - Level of Assistance: 4: Min assist Dynamic Standing Balance Dynamic Standing - Balance Support: Bilateral upper extremity supported;Left upper extremity supported;Right upper extremity supported;During functional activity Dynamic Standing - Level of Assistance: 4: Min assist Dynamic Standing - Balance Activities: Lateral lean/weight shifting;Forward lean/weight shifting;Reaching for objects Extremity Assessment  RUE Assessment RUE Assessment: Within Functional Limits LUE Assessment LUE Assessment: Within Functional Limits RLE Assessment RLE Assessment: Exceptions to [facility] RLE Strength RLE Overall Strength Comments: Grossly 4-/5 LLE Assessment LLE Assessment: Exceptions to [facility] LLE Strength LLE Overall Strength Comments: Grossly 4-/5  FIM:  FIM - Bed/Chair Transfer Bed/Chair Transfer Assistive Devices: Bed rails;Arm rests;Walker Bed/Chair Transfer: 4: Sit > Supine: Min A (steadying pt. > 75%/lift 1 leg);4: Supine > Sit: Min A (steadying Pt. > 75%/lift 1 leg);4: Bed > Chair or W/C: Min A (steadying Pt. > 75%);4: Chair or W/C > Bed: Min A (steadying Pt. > 75%) FIM - Locomotion: Wheelchair Distance: 175' Locomotion: Wheelchair: 5: Travels 150 ft or more: maneuvers on rugs and over door sills with supervision, cueing or coaxing FIM - Locomotion: Ambulation Locomotion: Ambulation Assistive Devices: Administrator Ambulation/Gait Assistance: 4: Min assist Locomotion: Ambulation: 2: Travels 50 - 149 ft with minimal assistance (Pt.>75%) FIM - Locomotion: Stairs Locomotion: Scientist, physiological: Hand rail - 2 Locomotion: Stairs: 2: Up and Down 4 - 11 stairs with  minimal assistance (Pt.>75%)   Refer to Care Plan for Long Term Goals  Recommendations for other services: Neuropsych  Discharge Criteria: Patient will be discharged from PT if patient refuses treatment 3 consecutive times without medical reason, if treatment goals not met, if there is a change in  medical status, if patient makes no progress towards goals or if patient is discharged from hospital.  The above assessment, treatment plan, treatment alternatives and goals were discussed and mutually agreed upon: by patient  [name] 06/22/2013, 3:48 PM

## 2013-06-22 NOTE — Progress Notes (Signed)
[facility]     PROGRESS NOTE    Subjective/Complaints: "i feel a lot better today. Yesterday I just felt like I could die."  Ready to start therapies A 12 point review of systems has been performed and if not noted above is otherwise negative.   Objective: Vital Signs: Blood pressure 102/65, pulse 73, temperature 97 F (36.1 C), temperature source Oral, resp. rate 18, height 5\' 8"  (1.727 m), weight 58.196 kg (128 lb 4.8 oz), SpO2 97.00%. No results found.  Recent Labs  06/21/13 1750 06/22/13 0620  WBC 13.1* 9.6  HGB 11.3* 10.6*  HCT 34.2* 32.6*  PLT 356 323    Recent Labs  06/19/13 1316 06/21/13 1750 06/22/13 0620  NA 129*  --  143  K 5.0  --  4.6  CL 89*  --  106  GLUCOSE 296*  --  115*  BUN 16  --  31*  CREATININE 0.78 0.86 0.93  CALCIUM 9.0  --  8.9   CBG (last 3)   Recent Labs  06/21/13 1653 06/21/13 2100 06/22/13 0726  GLUCAP 142* 178* 85    Wt Readings from Last 3 Encounters:  06/21/13 58.196 kg (128 lb 4.8 oz)  06/18/13 54.3 kg (119 lb 11.4 oz)  06/07/13 52.481 kg (115 lb 11.2 oz)    Physical Exam:  Constitutional:  58 year old male appearing older than stated age.  HENT:  Poor dentition/ oral mucosa pink/moist  Eyes: EOM are normal.  Neck: Normal range of motion. Neck supple. No thyromegaly present.  Cardiovascular: Normal rate and regular rhythm. No murmur  Respiratory: Effort normal and breath sounds normal. No respiratory distress. No wheezes  GI: Soft. Bowel sounds are normal. He exhibits no distension.  Neurological: He is alert.  Patient follows simple commands. He was able to provide his name and place. Knew he was on inpatient rehab. He was unsure of his age.   Limited insight and awareness.  intention tremor bilateral lower extremities greater than bilateral upper extremities  Motor strength is 4+/5 bilateral deltoid, bicep, tricep, grip bilaterally. LE's 3+ prox HF, 4- KE and 4/5 at ankles.   Skin: vitiligo diffusely, diffuse maculopapular drug rash which is resolving--some exfoliation Psych: calm, non-distressed     Assessment/Plan: 1. Functional deficits secondary to neurosyphilis which require 3+ hours per day of interdisciplinary therapy in a comprehensive inpatient rehab setting. Physiatrist is providing close team supervision and 24 hour management of active medical problems listed below. Physiatrist and rehab team continue to assess barriers to discharge/monitor patient progress toward functional and medical goals. FIM:                   Comprehension Comprehension Mode: Auditory Comprehension: 5-Follows basic conversation/direction: With no assist  Expression Expression Mode: Verbal Expression: 5-Expresses basic needs/ideas: With no assist  Social Interaction Social Interaction: 5-Interacts appropriately 90% of the time - Needs monitoring or encouragement for participation or interaction.  Problem Solving Problem Solving: 5-Solves basic problems: With no assist  Memory Memory: 4-Recognizes or recalls 75 - 89% of the time/requires cueing 10 - 24% of the time  Medical Problem List and Plan:  1. Neurosyphilis: s/p PCN 2. DVT Prophylaxis/Anticoagulation: Subcutaneous heparin.    3. Pain Management: Tylenol as needed  4. Mood/depression: Lexapro 5 mg daily. Followup per psychiatry services   -don't how much credibility can be given to his suicidal ideations given his cognitive status  -sitter for now 5. Neuropsych: This patient is not capable of  making decisions on his own behalf.  6. Urticarial drug rash-felt to be secondary penicillin. Rash improving prednisone taper. Contact precautions for open lesions.   7. COPD. Check oxygen saturations every shift  8. Heart block status post pacemaker. Chest pain or shortness of breath  9. Hypertension. Tenormin 25 mg daily. Monitor with increased mobility 10. FEN: encourage po fluids--recheck bmet  tomorrow 11. HIV: truvada, t[place] per ID  LOS (Days) 1 A FACE TO FACE EVALUATION WAS PERFORMED  [name] 06/22/2013 8:10 AM

## 2013-06-22 NOTE — Evaluation (Signed)
Occupational Therapy Assessment and Plan  Patient Details  Name: Shaun Lutz MRN: [id number] Date of Birth: 31-Dec-1955  OT Diagnosis: abnormal posture, cognitive deficits and muscle weakness (generalized) Rehab Potential: Rehab Potential: Good ELOS: 7-10   Today's Date: 06/22/2013 Time: 0930-1030 Time Calculation (min): 60 min  Problem List:  Patient Active Problem List   Diagnosis Date Noted  . HIV (human immunodeficiency virus infection) 06/21/2013  . Malnutrition of moderate degree 06/08/2013  . Human immunodeficiency virus (HIV) disease 06/07/2013  . Syphilis in male 06/07/2013  . Lower extremity weakness 06/07/2013  . Syphilis 06/07/2013  . Abnormality of gait 05/22/2013  . Decreased hearing of both ears 11/23/2012  . Hypotension 08/22/2012  . Physical deconditioning 08/10/2012  . Underweight 08/10/2012  . Occasional tremors 07/08/2012  . At [facility] for Falls 07/08/2012  . Pacemaker- medtronic 05/13/2012  . SYNCOPE 04/07/2010  . OTHER&UNSPECIFIED ALCOHOL DEPENDENCE REMISSION 03/05/2010  . COPD 03/05/2010  . VITILIGO 03/05/2010  . ATRIOVENTRICULAR BLOCK, MOBITZ TYPE II 03/05/2010    Past Medical History:  Past Medical History  Diagnosis Date  . Hypertension   . Second degree AV block   . Syncope   . Pacemaker -Medtronic     DOI 2012   Past Surgical History:  Past Surgical History  Procedure Laterality Date  . Pacemaker insertion      Assessment & Plan Clinical Impression: Patient is a 58 y.o. year old right handed male history of hypertension, COPD, heart block status post pacemaker. Patient lives with his girlfriend.   Admitted 06/07/2013 with progressive gait disorder as well as hand tremors with recent fall. Patient tested positive HIV as well as syphilis with rapid-plasma-reagin (RPR) score of 1:512. 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  Infectious disease consulted plan treatment of 14 days of high-dose penicillin and 3 doses of weekly PCN. Started on ARV with workup ongoing. Subcutaneous heparin for DVT prophylaxis.   ON 4/20 psychiatry agreed that pt has passive thought of suicide and does not currently require a sitter and it is to be discontinued.   Dr. Tommy Medal, Infectious Disease, reconsulted about antibiotic plan since pt developed a rash, concerning for penicillin sensitivity. Antibiotics switched to ceftriaxone and antiviral, Stribild, discontinued. New antiviral to be started. HIV genomic studies in process. HIV RNA quant approx 60,000; CD4 250. On 4/20 pt on IV solumedrol q 6hrs.  Patient transferred to CIR on 06/21/2013 .    Patient currently requires mod with basic self-care skills secondary to muscle weakness and decreased initiation, decreased attention, decreased awareness and delayed processing.  Prior to hospitalization, patient could complete BADL with min.  Patient will benefit from skilled intervention to decrease level of assist with basic self-care skills prior to discharge home with care partner.  Anticipate patient will require 24 hour supervision and follow up home health.  OT - End of Session Activity Tolerance: Tolerates 10 - 20 min activity with multiple rests Endurance Deficit: Yes OT Assessment Rehab Potential: Good OT Patient demonstrates impairments in the following area(s): Balance;Endurance;Motor;Safety;Cognition OT Basic ADL's Functional Problem(s): Eating;Grooming;Bathing;Dressing;Toileting OT Transfers Functional Problem(s): Toilet;Tub/Shower OT Plan OT Intensity: Minimum of 1-2 x/day, 45 to 90 minutes OT Frequency: 5 out of 7 days OT Duration/Estimated Length of Stay: 7-10 OT Treatment/Interventions: Cognitive remediation/compensation;Discharge planning;Disease mangement/prevention;DME/adaptive equipment instruction;Patient/family education;Self Care/advanced ADL retraining;Functional  mobility training;Therapeutic Activities;Therapeutic Exercise;UE/LE Coordination activities;UE/LE Strength taining/ROM;Balance/vestibular training OT Self Feeding Anticipated Outcome(s): Supervision OT  Basic Self-Care Anticipated Outcome(s): Min A OT Toileting Anticipated Outcome(s): Supervision OT Bathroom Transfers Anticipated Outcome(s): Supervision OT Recommendation Patient destination: Home Follow Up Recommendations: Home health OT Equipment Recommended: To be determined  Skilled Therapeutic Intervention 1:1 OT initial evaluation completed with treatment provided to improve attention and awareness, provoke memory, and compensate for moderate R-UE tremors.   Patient required moderate assist with lower body bathing and dressing at w/c level beside sink but was able to stand 3 times for up to 30 seconds before becoming fatigued.   Patient consistently contradicted his own statements during eval/interview regarding social contacts, employment/work history, performance of self-care, and general activity tolerance, suspicious for possible dementia or moderate cognitive impairment to be further explored/assessed.    Patient's significant other Inez Catalina) arrived near end of session and presented clothing items for ADL.  OT Evaluation Precautions/Restrictions  Precautions Precautions: Fall Precaution Comments: body tremors during functional mobility  Restrictions Weight Bearing Restrictions: No  General Chart Reviewed: Yes Family/Caregiver Present: No  Pain Pain Assessment Pain Assessment: No/denies pain Pain Score: 0-No pain  Home Living/Prior Functioning Home Living Available Help at Discharge: Other (Comment) Type of Home: House Home Access: Stairs to enter Entrance Stairs-Number of Steps: 5 Entrance Stairs-Rails: Right Home Layout: One level Additional Comments: not sure  Lives With: Significant other (married but not living with spouse "Lakoti") IADL History Homemaking  Responsibilities: Yes Meal Prep Responsibility: No Laundry Responsibility: No Cleaning Responsibility: No Bill Paying/Finance Responsibility: Secondary Shopping Responsibility: No Child Care Responsibility: No Current License: No Occupation: On disability Type of Occupation: general labor, 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) Leisure and Hobbies: former recreational target shooting, outdoor hiking Prior Function Level of Independence: Needs assistance with ADLs;Independent with gait;Independent with transfers Dressing: Minimal Grooming: Minimal  Able to Take Stairs?: Yes Driving: No Vocation: On disability Comments: Pt req occasional falls at home PTA  ADL ADL ADL Comments: see FIM  Vision/Perception  Vision- History Baseline Vision/History: Wears glasses (Pt states that they are broken and he needs a new pair) Wears Glasses: At all times Patient Visual Report: No change from baseline   Cognition Overall Cognitive Status: Impaired/Different from baseline Arousal/Alertness: Awake/alert Orientation Level: Oriented to person;Oriented to situation;Disoriented to place Attention: Selective Sustained Attention: Appears intact Selective Attention: Impaired Selective Attention Impairment: Functional basic Memory: Impaired Memory Impairment: Retrieval deficit;Decreased recall of new information Awareness: Impaired Awareness Impairment: Intellectual impairment Problem Solving: Appears intact Executive Function: Writer: Impaired Organizing Impairment: Functional basic;Verbal complex Safety/Judgment: Appears intact Comments: Decreased speed of processing  Sensation Sensation Light Touch: Appears Intact Stereognosis: Appears Intact Hot/Cold: Appears Intact Proprioception: Appears Intact Coordination Gross Motor Movements are Fluid and Coordinated: No Fine Motor Movements are Fluid and Coordinated: No Coordination and Movement Description:  Intention tremors noted in all 4 extremities during mobility, moderate at R-UE, mild at L-UE  Motor  Motor Motor: Other (comment) Motor - Skilled Clinical Observations: Generalized weakness, Intention tremor in B UE/LE  Mobility  Bed Mobility Bed Mobility: Rolling Right;Right Sidelying to Sit Rolling Right: 4: Min assist Rolling Right Details: Verbal cues for sequencing;Verbal cues for technique;Verbal cues for precautions/safety Right Sidelying to Sit: 4: Min assist Right Sidelying to Sit Details: Manual facilitation for weight shifting;Verbal cues for sequencing;Verbal cues for technique;Verbal cues for precautions/safety Transfers Sit to Stand: 4: Min assist Sit to Stand Details: Verbal cues for precautions/safety;Verbal cues for safe use of DME/AE;Tactile cues for posture Sit to Stand Details (indicate cue type and reason): verbal cues for  hand placement Stand to Sit: 4: Min assist Stand to Sit Details (indicate cue type and reason): Verbal cues for precautions/safety;Verbal cues for safe use of DME/AE   Trunk/Postural Assessment  Cervical Assessment Cervical Assessment: Exceptions to [facility] (forward head) Thoracic Assessment Thoracic Assessment: Exceptions to [facility] (trunk flexion that progresses with fatigue) Lumbar Assessment Lumbar Assessment: Exceptions to [facility] (posterior pelvic tilt) Postural Control Postural Control: Deficits on evaluation Righting Reactions: delayed in standing   Balance Balance Balance Assessed: Yes Static Sitting Balance Static Sitting - Balance Support: Left upper extremity supported;Right upper extremity supported;Feet supported;Bilateral upper extremity supported Static Sitting - Level of Assistance: 5: Stand by assistance Dynamic Sitting Balance Dynamic Sitting - Balance Support: Right upper extremity supported;Left upper extremity supported;Bilateral upper extremity supported;Feet supported Dynamic Sitting - Level of Assistance: 4: Min assist;5:  Stand by assistance Dynamic Sitting - Balance Activities: Forward lean/weight shifting;Lateral lean/weight shifting;Reaching for Consulting civil engineer Standing - Balance Support: Bilateral upper extremity supported Static Standing - Level of Assistance: 4: Min assist Dynamic Standing Balance Dynamic Standing - Balance Support: Bilateral upper extremity supported;Left upper extremity supported;Right upper extremity supported;During functional activity Dynamic Standing - Level of Assistance: 4: Min assist Dynamic Standing - Balance Activities: Lateral lean/weight shifting;Forward lean/weight shifting;Reaching for objects  Extremity/Trunk Assessment RUE Assessment RUE Assessment: Within Functional Limits LUE Assessment LUE Assessment: Within Functional Limits  FIM:  FIM - Bathing Bathing Steps Patient Completed: Chest;Right Arm;Left Arm;Abdomen;Front perineal area;Right upper leg;Left upper leg;Right lower leg (including foot);Left lower leg (including foot) Bathing: 4: Min-Patient completes 8-9 24f10 parts or 75+ percent FIM - Upper Body Dressing/Undressing Upper body dressing/undressing steps patient completed: Thread/unthread right sleeve of pullover shirt/dresss;Thread/unthread left sleeve of pullover shirt/dress;Put head through opening of pull over shirt/dress;Pull shirt over trunk Upper body dressing/undressing: 5: Set-up assist to: Obtain clothing/put away FIM - Lower Body Dressing/Undressing Lower body dressing/undressing: 1: Total-Patient completed less than 25% of tasks FIM - Toileting Toileting: 0: Activity did not occur FIM - BControl and instrumentation engineerDevices: Bed rails;Walker;HOB elevated Bed/Chair Transfer: 4: Supine > Sit: Min A (steadying Pt. > 75%/lift 1 leg);4: Bed > Chair or W/C: Min A (steadying Pt. > 75%);4: Chair or W/C > Bed: Min A (steadying Pt. > 75%) FIM - TRadio producerDevices: WEducation officer, museumTransfers: 4-To toilet/BSC: Min A (steadying Pt. > 75%);4-From toilet/BSC: Min A (steadying Pt. > 75%) FIM - Tub/Shower Transfers Tub/Shower Assistive Devices: Walk in shower;Tub transfer bench;Grab bars Tub/shower Transfers: 4-Into Tub/Shower: Min A (steadying Pt. > 75%/lift 1 leg);4-Out of Tub/Shower: Min A (steadying Pt. > 75%/lift 1 leg)   Refer to Care Plan for Long Term Goals  Recommendations for other services: None  Discharge Criteria: Patient will be discharged from OT if patient refuses treatment 3 consecutive times without medical reason, if treatment goals not met, if there is a change in medical status, if patient makes no progress towards goals or if patient is discharged from hospital.  The above assessment, treatment plan, treatment alternatives and goals were discussed and mutually agreed upon: by patient  FSalome Spotted4/23/2015, 12:11 PM

## 2013-06-22 NOTE — Progress Notes (Signed)
INITIAL NUTRITION ASSESSMENT  DOCUMENTATION CODES Per approved criteria  -Non-severe (moderate) malnutrition in the context of chronic illness   Pt meets criteria for moderate MALNUTRITION in the context of chronic illness as evidenced by moderate muscle and fat mass loss.  INTERVENTION: Continue Ensure Complete po TID, each supplement providing 350 kcals and 13 grams of protein   NUTRITION DIAGNOSIS: Increased nutrient needs related to HIV as evidenced by estimated needs.   Goal: Meet >/= 90% of estimated nutrition needs.   Monitor:  PO intake, supplement acceptance, weight trends, labs   Reason for Assessment: Positive Malnutrition Screening Tool Score   58 y.o. male  Admitting Dx: Neurosyphilis   ASSESSMENT: 58 y.o. 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 He was admitted via Neurology office on 06/07/13 with positive HIV as well as syphilis and concerns of neurosyphilis.  Pt was admitted 4/22 to Inpatient Rehabilitation with the diagnosis of neurosyphilis.  Pt's care taker in room reports that pt is looking better than normal and is eating well. She states that pt's doctor has him on a regimen of Ensure TID and he is compliant with these. Sitter reports that pt ate most of his breakfast and that his appetite is good.   Pt has gained weight in the last few days. Bed weight during assessment was 128 lbs. Care taker reports that his normal weight is 114 lbs.   Elevated glucose and BUN  Pt continues to display moderate wasting of muscle and fat via visual exam.    Nutrition Focused Physical Exam: Subcutaneous Fat:  Orbital Region: moderate depletion  Upper Arm Region: n/a  Thoracic and Lumbar Region: n/a  Muscle:  Temple Region: moderate depletion  Clavicle Bone Region: moderate depletion  Clavicle and Acromion Bone Region: n/a Scapular Bone Region: n/a Dorsal Hand: n/a Patellar Region: n/a Anterior Thigh  Region: n/a Posterior Calf Region: n/a  Edema: none    Height: Ht Readings from Last 1 Encounters:  06/21/13 5\' 8"  (1.727 m)    Weight: Wt Readings from Last 1 Encounters:  06/22/13 128 lb 1.6 oz (58.106 kg)    Ideal Body Weight: 154 lbs   % Ideal Body Weight: 83%   Wt Readings from Last 10 Encounters:  06/22/13 128 lb 1.6 oz (58.106 kg)  06/18/13 119 lb 11.4 oz (54.3 kg)  06/07/13 115 lb 11.2 oz (52.481 kg)  06/05/13 117 lb (53.071 kg)  05/22/13 119 lb (53.978 kg)  11/23/12 116 lb (52.617 kg)  08/22/12 112 lb (50.803 kg)  08/10/12 114 lb (51.71 kg)  07/15/12 115 lb (52.164 kg)  07/08/12 118 lb (53.524 kg)    Usual Body Weight: 114 lbs   % Usual Body Weight: 112%   BMI:  Body mass index is 19.48 kg/(m^2).  Estimated Nutritional Needs: Kcal: 1650 - 1850 Protein: 65 - 80 g  Fluid: 1.6 - 1.8 liters   Skin: scratch in crease of leg beside scrotum   Diet Order: Dysphagia 3, thin  EDUCATION NEEDS: -No education needs identified at this time   Intake/Output Summary (Last 24 hours) at 06/22/13 1235 Last data filed at 06/22/13 1055  Gross per 24 hour  Intake    480 ml  Output   1650 ml  Net  -1170 ml    Last BM: 4/22   Labs:   Recent Labs Lab 06/19/13 0626 06/19/13 1316 06/21/13 1750 06/22/13 0620  NA 126* 129*  --  143  K  5.4* 5.0  --  4.6  CL 89* 89*  --  106  CO2 25 22  --  28  BUN 14 16  --  31*  CREATININE 0.86 0.78 0.86 0.93  CALCIUM 8.6 9.0  --  8.9  GLUCOSE 117* 296*  --  115*    CBG (last 3)   Recent Labs  06/21/13 2100 06/22/13 0726 06/22/13 1153  GLUCAP 178* 85 132*    Scheduled Meds: . aspirin  81 mg Oral Daily  . atenolol  25 mg Oral Daily  . dolutegravir  50 mg Oral Daily  . emtricitabine-tenofovir  1 tablet Oral Daily  . escitalopram  5 mg Oral QHS  . feeding supplement (ENSURE COMPLETE)  237 mL Oral TID  . heparin  5,000 Units Subcutaneous 3 times per day  . insulin aspart  0-5 Units Subcutaneous QHS  .  predniSONE  20 mg Oral Q breakfast    Continuous Infusions:   Past Medical History  Diagnosis Date  . Hypertension   . Second degree AV block   . Syncope   . Pacemaker -Medtronic     DOI 2012    Past Surgical History  Procedure Laterality Date  . Pacemaker insertion      Carrolyn Leigh, BS Dietetic Intern  Pager: [phone]   Agree with interventions and documentation of dietetic intern.  Will continue to follow weight trends.  Bed scale weight accuracy dependent upon accurate tare- recommend obtaining standing scale weight.  Pt is eating well.  Brynda Greathouse, MS RD LDN Clinical Inpatient Dietitian Pager: [phone] Weekend/After hours pager: [phone]

## 2013-06-23 ENCOUNTER — Inpatient Hospital Stay (HOSPITAL_COMMUNITY): Payer: Self-pay

## 2013-06-23 ENCOUNTER — Encounter (HOSPITAL_COMMUNITY): Payer: Self-pay

## 2013-06-23 ENCOUNTER — Inpatient Hospital Stay (HOSPITAL_COMMUNITY): Payer: Medicare Other

## 2013-06-23 DIAGNOSIS — R5381 Other malaise: Secondary | ICD-10-CM | POA: Diagnosis not present

## 2013-06-23 DIAGNOSIS — A5211 Tabes dorsalis: Secondary | ICD-10-CM

## 2013-06-23 LAB — BASIC METABOLIC PANEL
BUN: 24 mg/dL — AB (ref 6–23)
CALCIUM: 8.3 mg/dL — AB (ref 8.4–10.5)
CO2: 28 meq/L (ref 19–32)
CREATININE: 0.75 mg/dL (ref 0.50–1.35)
Chloride: 102 mEq/L (ref 96–112)
GFR calc Af Amer: >90 mL/min (ref >90)
GFR calc non Af Amer: >90 mL/min (ref >90)
GLUCOSE: 101 mg/dL — AB (ref 70–99)
Potassium: 4.6 mEq/L (ref 3.7–5.3)
Sodium: 140 mEq/L (ref 137–147)

## 2013-06-23 LAB — GLUCOSE, CAPILLARY
GLUCOSE-CAPILLARY: 151 mg/dL — AB (ref 70–99)
Glucose-Capillary: 104 mg/dL — ABNORMAL HIGH (ref 70–99)
Glucose-Capillary: 91 mg/dL (ref 70–99)
Glucose-Capillary: 151 mg/dL — ABNORMAL HIGH (ref 70–99)

## 2013-06-23 NOTE — Progress Notes (Signed)
Social Work  Social Work Assessment and Plan  Patient Details  Name: Shaun Lutz MRN: [id number] Date of Birth: February 25, 1956  Today's Date: 06/23/2013  Problem List:  Patient Active Problem List   Diagnosis Date Noted  . HIV (human immunodeficiency virus infection) 06/21/2013  . Malnutrition of moderate degree 06/08/2013  . Human immunodeficiency virus (HIV) disease 06/07/2013  . Syphilis in male 06/07/2013  . Lower extremity weakness 06/07/2013  . Syphilis 06/07/2013  . Abnormality of gait 05/22/2013  . Decreased hearing of both ears 11/23/2012  . Hypotension 08/22/2012  . Physical deconditioning 08/10/2012  . Underweight 08/10/2012  . Occasional tremors 07/08/2012  . At [place] for Falls 07/08/2012  . Pacemaker- medtronic 05/13/2012  . SYNCOPE 04/07/2010  . OTHER&UNSPECIFIED ALCOHOL DEPENDENCE REMISSION 03/05/2010  . COPD 03/05/2010  . VITILIGO 03/05/2010  . ATRIOVENTRICULAR BLOCK, MOBITZ TYPE II 03/05/2010   Past Medical History:  Past Medical History  Diagnosis Date  . Hypertension   . Second degree AV block   . Syncope   . Pacemaker -Medtronic     DOI 2012   Past Surgical History:  Past Surgical History  Procedure Laterality Date  . Pacemaker insertion     Social History:  reports that he has been smoking Cigarettes.  He has a 8.75 pack-year smoking history. He does not have any smokeless tobacco history on file. He reports that he does not drink alcohol or use illicit drugs.  Family / Support Systems Marital Status: Divorced How Long?: "long time" Patient Roles: Production manager;Other (Comment) (estranged from his adult children) Spouse/Significant Other: girlfriend of 1 yrs, Fredrik Cove @ (C) [phone] Children: twin children, however, no contact Other Supports: Inez Catalina has local siblings who have offered to assist.  She notes her brother is a CNA and will be involved. Anticipated Caregiver: Inez Catalina Ability/Limitations of Caregiver: can provide min assist  level Caregiver Availability: 24/7 Family Dynamics: pt reports good relationship with gf and her family.  Denies any concerns about amount of help he will have at home.    Social History Preferred language: English Religion: Non-Denominational Cultural Background: NA Education: HS grad plus 2 yrs in [employer] Read: Yes Write: Yes Employment Status: Disabled Date Retired/Disabled/Unemployed: 3 yrs Freight forwarder Issues: none - per MD, pt not capable of making decisions on his own behalf.  Legally would defer to adult children, however, pt is estranged from them currently.  Will address this with pt and gf further.  Adm Coor indicates Inez Catalina is POA, however, does not have paperwork.  Will follow up. Guardian/Conservator: none   Abuse/Neglect Physical Abuse: Denies Verbal Abuse: Denies Sexual Abuse: Denies Exploitation of patient/patient's resources: Denies Self-Neglect: Denies  Emotional Status Pt's affect, behavior adn adjustment status: Pt very pleasant and soft-spoken.  Completes assessment interview easily and most information he provides is correct per gf.  Pt denies any emotional distress.  No s/s of depression or anxiety, however, will monitor.  Will go ahead and refer for neuropsych eval for cognition and coping.  Recent Psychosocial Issues: None Pyschiatric History: None - during this hospitalization, pt has expressed passive thoughts of suicide and currently has a sitter assigned to him.  Pt and gf deny any prior psych treatment or hospitalization.   Substance Abuse History: None  Patient / Family Perceptions, Expectations & Goals Pt/Family understanding of illness & functional limitations: Pt states, "I don't really know what happened or why I'm here... I know they told me I got some sickness that came over my brain..My brain  don't always work right."  Pt has been told of his HIV diagnosis, however, is not retaining this information.  Girlfriend is aware and understands  diagnosis.  She has already gone to the [facility] herself and completed testing. Premorbid pt/family roles/activities: Pt and gf independent overall PTA.  Neither drive so they use bus system or friends to get around in community. Anticipated changes in roles/activities/participation: Inez Catalina prepared to become pt's primary caregiver.  Well aware that he is expected to require 24/7 supervision and possible some assistance. Pt/family expectations/goals: Pt's goal is to d/c home.  Gf with same goals.  Community [employer] Agencies: None Premorbid Home Care/DME Agencies: Other (Comment) Inez Catalina has a CNA who assists her prn) Transportation available at discharge: Need to submit SCAT application per gf request Resource referrals recommended: Neuropsychology;Support group (specify);Advocacy groups  Discharge Planning Living Arrangements: Spouse/significant other;Non-relatives/Friends Support Systems: Spouse/significant other;Friends/neighbors;Other relatives Type of Residence: Private residence Insurance Resources: [employer] Financial Resources: [employer] Financial Screen Referred: No Living Expenses: Education officer, community Management: Significant Other Does the patient have any problems obtaining your medications?: No Home Management: pt and Inez Catalina shared responsibilities along with assist from her CNA Patient/Family Preliminary Plans: Girlfriend hopes to have pt d/c home with her as primary support and caregiver Social Work Anticipated Follow Up Needs: HH/OP;Support Group Expected length of stay: ELOS 7-10 days  Clinical Impression Very pleasant, soft spoken gentleman here for deconditioning.  New HIV and neurosyphilis diagnoses.  Very supportive gf who is aware of medical issues and has completed her own testing as recommended by ID MD.  Gf very committed to providing 24/7 care to pt upon d/c.  Anticipate relatively short LOS.  Will request neuropsych eval for cognnition and coping. Will follow for  support and d/c planning/ resource referrals.  Lennart Pall 06/23/2013, 10:51 AM

## 2013-06-23 NOTE — Telephone Encounter (Signed)
Thanks, I appreciate that. Thanks for the correction of your name, it was a typo.

## 2013-06-23 NOTE — Progress Notes (Signed)
Occupational Therapy Session Note  Patient Details  Name: Lemarcus Baggerly MRN: [id number] Date of Birth: 03-Feb-1956  Today's Date: 06/23/2013 Time: 0700-0755 Time Calculation (min): 55 min  Short Term Goals: Week 1:  OT Short Term Goal 1 (Week 1): STG=LTG due to anticipated briefl length of stay OT Short Term Goal 2 (Week 1): Pateint will complete upper body bathing sitting at sink with   Skilled Therapeutic Interventions/Progress Updates:    Pt engaged in BADLs including bathing at shower level and sit<>stand from seat.  Pt requires multiple rest breaks throughout session secondary to increased fatigue with activity.  Pt stood for approx 2 mins while RN changed dressing on buttocks.  Tremors noted with bed mobility when grabbing bed rails to sit EOB.  Focus on activity tolerance, safety awareness, functional amb with RW for transfers, sit<>stand, and dynamic standing balance.   Therapy Documentation Precautions:  Precautions Precautions: Fall Precaution Comments: body tremors during functional mobility  Restrictions Weight Bearing Restrictions: No Pain: Pain Assessment Pain Assessment: No/denies pain Pain Score: 0-No pain ADL: ADL ADL Comments: see FIM  See FIM for current functional status  Therapy/Group: Individual Therapy  Leroy Libman 06/23/2013, 7:57 AM

## 2013-06-23 NOTE — Progress Notes (Signed)
Speech Language Pathology Daily Session Note  Patient Details  Name: Shaun Lutz MRN: [id number] Date of Birth: May 24, 1955  Today's Date: 06/23/2013 Time: 1000-1045 Time Calculation (min): 45 min  Short Term Goals: Week 1: SLP Short Term Goal 1 (Week 1): STGs = LTGs due to anticipated brief length of stay  Skilled Therapeutic Interventions: Skilled treatment focused on cognitive goals. SLP facilitated session with introduction of memory strategies. Pt unable to recall daily events, however utilized written schedule to facilitate recall with Mod cues. Pt required Max cues for basic time calculations, due in part to decreased working memory. Pt required Mod question cues for intellectual awareness of physical impairments, however required Max cues for identification of cognitive changes. Pt required Min cues for recall of instructions for bed<>chair transfers for safety. Continue plan of care.   FIM:  Comprehension Comprehension Mode: Auditory Comprehension: 4-Understands basic 75 - 89% of the time/requires cueing 10 - 24% of the time Expression Expression Mode: Verbal Expression: 5-Expresses basic needs/ideas: With extra time/assistive device Social Interaction Social Interaction: 4-Interacts appropriately 75 - 89% of the time - Needs redirection for appropriate language or to initiate interaction. Problem Solving Problem Solving: 3-Solves basic 50 - 74% of the time/requires cueing 25 - 49% of the time Memory Memory: 2-Recognizes or recalls 25 - 49% of the time/requires cueing 51 - 75% of the time  Pain Pain Assessment Pain Assessment: No/denies pain Pain Score: 0-No pain  Therapy/Group: Individual Therapy   Germain Osgood, M.A. CCC-SLP [phone]  Germain Osgood 06/23/2013, 10:53 AM

## 2013-06-23 NOTE — Progress Notes (Signed)
Physical Therapy Session Note  Patient Details  Name: Shaun Lutz MRN: [id number] Date of Birth: 1955-03-31  Today's Date: 06/23/2013 Time: Treatment Session 1: 0800-0900; Treatment Session [id number] Time Calculation (min): Treatment Session 1: 60 min; Treatment Session 2: 13min  Short Term Goals: Week 1:  PT Short Term Goal 1 (Week 1): STGs=LTGs due to anticipated LOS  Skilled Therapeutic Interventions/Progress Updates:  Treatment Session 1:  1:1. Pt received sitting in w/c, ready for therapy. Focus this session on functional endurance, posture and balance. Pt with excellent tolerance to w/c propulsion 150'x2 (supervision) and ambulation 75'x1, 100'x1 and 150'x2 w/ RW (min guard A) to target functional endurance. Pt cued to take seated rest when noted increased B knee flexion. Pt performed obstacle negotiation course 1x w/ min HHA and 2x w/ min A (gait belt) to challenge dynamic balance. Pt managing horseshoes from basketball rim<>low bench on both L and R sides to facilitate gross postural extension while reaching towards elevated basketball hoop placed on 8" step. Pt occasionally loosing balance in posterior direction, req min A for safe completion of task. Pt req seated rest throughout session, but good tolerance overall. Pt supine in bed at end of session w/ all needs in reach, sitter in room.   Treatment Session 2:  1:1. Pt received semi-reclined in bed sleeping, req mod verbal/tactile cues 3x to wake and sustain EO. Pt practiced log roll technique to R side for transition R sidelying to sit w/ use of bed rail, min A and cueing for technique. Majority of tx session focused on functional endurance through ambulation 300'x1 and 100'x2 w/ RW req very close (S) as well as use of NuStep for B UE/LE strength/endurance, level 3x76min, w/ overall good tolerance. During use of NuStep emphasis on memory and recalling tasks completed in early therapy session. Therapist providing supervision while pt  using commode until nurse tech took over.   Therapy Documentation Precautions:  Precautions Precautions: Fall Precaution Comments: body tremors during functional mobility  Restrictions Weight Bearing Restrictions: No  See FIM for current functional status  Therapy/Group: Individual Therapy  [name] 06/23/2013, 9:04 AM

## 2013-06-23 NOTE — IPOC Note (Signed)
Overall Plan of Care [facility]) Patient Details Name: Shaun Lutz MRN: [id number] DOB: [birth year]  Admitting Diagnosis: neurosyphyllis  Hospital Problems: Active Problems:   HIV (human immunodeficiency virus infection)     Functional Problem List: Nursing Behavior;Bladder;Bowel;Edema;Endurance;Medication Management;Motor;Nutrition;Pain;Perception;Safety;Sensory;Skin Integrity  PT Balance;Endurance;Motor;Safety;Other (comment);Skin Integrity (strength)  OT Balance;Endurance;Motor;Safety;Cognition  SLP Cognition  TR         Basic ADL's: OT Eating;Grooming;Bathing;Dressing;Toileting     Advanced  ADL's: OT       Transfers: PT Bed Mobility;Bed to Chair;Car;Furniture;Floor  OT Toilet;Tub/Shower     Locomotion: PT Stairs;Wheelchair Mobility;Ambulation     Additional Impairments: OT    SLP Social Cognition   Memory;Awareness;Problem Solving  TR      Anticipated Outcomes Item Anticipated Outcome  Self Feeding Supervision  Swallowing      Basic self-care  Min A  Toileting  Supervision   Bathroom Transfers Supervision  Bowel/Bladder  Min A  Transfers  Supervision  Locomotion  Mod(I) for w/c propulsion; Supervision for standing mobility  Communication     Cognition  Mod  Pain  <1 on a 0-10 scale  Safety/Judgment  Mod I   Therapy Plan: PT Intensity: Minimum of 1-2 x/day ,45 to 90 minutes PT Frequency: 5 out of 7 days PT Duration Estimated Length of Stay: 7-10days OT Intensity: Minimum of 1-2 x/day, 45 to 90 minutes OT Frequency: 5 out of 7 days OT Duration/Estimated Length of Stay: 7-10 SLP Intensity: Minumum of 1-2 x/day, 30 to 90 minutes SLP Frequency: 5 out of 7 days SLP Duration/Estimated Length of Stay: 7-10 days       Team Interventions: Nursing Interventions Patient/Family Education;Bladder Management;Bowel Management;Disease Management/Prevention;Pain Management;Medication Management;Skin Care/Wound Management;Cognitive  Remediation/Compensation;Dysphagia/Aspiration Precaution Training;Discharge Planning;Psychosocial Support  PT interventions Ambulation/gait training;Community reintegration;DME/adaptive equipment instruction;Neuromuscular re-education;Stair training;UE/LE Strength taining/ROM;Wheelchair propulsion/positioning;UE/LE Coordination activities;Therapeutic Activities;Skin care/wound management;Pain management;Discharge planning;Balance/vestibular training;Cognitive remediation/compensation;Disease management/prevention;Functional mobility training;Patient/family education;Therapeutic Exercise;Psychosocial support  OT Interventions Cognitive remediation/compensation;Discharge planning;Disease mangement/prevention;DME/adaptive equipment instruction;Patient/family education;Self Care/advanced ADL retraining;Functional mobility training;Therapeutic Activities;Therapeutic Exercise;UE/LE Coordination activities;UE/LE Strength taining/ROM;Balance/vestibular training  SLP Interventions Cognitive remediation/compensation;Cueing hierarchy;Environmental controls;Functional tasks;Internal/external aids;Patient/family education  TR Interventions    SW/CM Interventions Discharge Planning;Psychosocial Support;Patient/Family Education;Disease Management/Prevention    Team Discharge Planning: Destination: PT-Home ,OT- Home , SLP-Home Projected Follow-up: PT-Home health PT, OT-  Home health OT, SLP-Home Health SLP;Outpatient SLP;24 hour supervision/assistance Projected Equipment Needs: PT-To be determined, OT- To be determined, SLP-None recommended by SLP Equipment Details: PT- , OT-  Patient/family involved in discharge planning: PT- Patient,  OT-Patient, SLP-Patient  MD ELOS: 7-10 days Medical Rehab Prognosis:  Excellent Assessment: The patient has been admitted for CIR therapies. The team will be addressing functional mobility, strength, stamina, balance, safety, adaptive techniques and equipment, self-care, bowel and  bladder mgt, patient and caregiver education, NMR, cognition, communication, pain mgt. Goals have been set at mod I to supervision with mobility and min assist to supervision with self-care and Mod I with cognition.    [name], MD, FAAPMR      See Team Conference Notes for weekly updates to the plan of care

## 2013-06-23 NOTE — Progress Notes (Signed)
[place] PHYSICAL MEDICINE & REHABILITATION     PROGRESS NOTE    Subjective/Complaints: Mood pleasant. Slept well. In good spirits. Doesn't recall what he did yesterday A 12 point review of systems has been performed and if not noted above is otherwise negative.   Objective: Vital Signs: Blood pressure 112/70, pulse 71, temperature 98.2 F (36.8 C), temperature source Oral, resp. rate 18, height 5\' 8"  (1.727 m), weight 58.106 kg (128 lb 1.6 oz), SpO2 98.00%. No results found.  Recent Labs  06/21/13 1750 06/22/13 0620  WBC 13.1* 9.6  HGB 11.3* 10.6*  HCT 34.2* 32.6*  PLT 356 323    Recent Labs  06/22/13 0620 06/23/13 0600  NA 143 140  K 4.6 4.6  CL 106 102  GLUCOSE 115* 101*  BUN 31* 24*  CREATININE 0.93 0.75  CALCIUM 8.9 8.3*   CBG (last 3)   Recent Labs  06/22/13 1638 06/22/13 2037 06/23/13 0738  GLUCAP 157* 234* 104*    Wt Readings from Last 3 Encounters:  06/22/13 58.106 kg (128 lb 1.6 oz)  06/18/13 54.3 kg (119 lb 11.4 oz)  06/07/13 52.481 kg (115 lb 11.2 oz)    Physical Exam:  Constitutional:  58 year old male appearing older than stated age.  HENT:  Poor dentition/ oral mucosa pink/moist  Eyes: EOM are normal.  Neck: Normal range of motion. Neck supple. No thyromegaly present.  Cardiovascular: Normal rate and regular rhythm. No murmur  Respiratory: Effort normal and breath sounds normal. No respiratory distress. No wheezes  GI: Soft. Bowel sounds are normal. He exhibits no distension.  Neurological: He is alert.  Patient follows simple commands. He was able to provide his name and place. Knew he was on inpatient rehab. He was unsure of his age.   Limited insight and awareness.  intention tremor bilateral lower extremities greater than bilateral upper extremities  Motor strength is 4+/5 bilateral deltoid, bicep, tricep, grip bilaterally. LE's 3+ prox HF, 4- KE and 4/5 at ankles.  Skin: vitiligo diffusely, diffuse maculopapular drug rash  which is resolving--some exfoliation Psych: calm, non-distressed     Assessment/Plan: 1. Functional deficits secondary to neurosyphilis which require 3+ hours per day of interdisciplinary therapy in a comprehensive inpatient rehab setting. Physiatrist is providing close team supervision and 24 hour management of active medical problems listed below. Physiatrist and rehab team continue to assess barriers to discharge/monitor patient progress toward functional and medical goals. FIM: FIM - Bathing Bathing Steps Patient Completed: Chest;Right Arm;Left Arm;Abdomen;Front perineal area;Right upper leg;Left upper leg;Right lower leg (including foot);Left lower leg (including foot) Bathing: 4: Min-Patient completes 8-9 39f 10 parts or 75+ percent  FIM - Upper Body Dressing/Undressing Upper body dressing/undressing steps patient completed: Thread/unthread right sleeve of pullover shirt/dresss;Thread/unthread left sleeve of pullover shirt/dress;Put head through opening of pull over shirt/dress;Pull shirt over trunk Upper body dressing/undressing: 5: Set-up assist to: Obtain clothing/put away FIM - Lower Body Dressing/Undressing Lower body dressing/undressing: 1: Total-Patient completed less than 25% of tasks  FIM - Toileting Toileting: 0: Activity did not occur  FIM - Radio producer Devices: Mining engineer Transfers: 4-To toilet/BSC: Min A (steadying Pt. > 75%);4-From toilet/BSC: Min A (steadying Pt. > 75%)  FIM - Bed/Chair Transfer Bed/Chair Transfer Assistive Devices: Bed rails;Walker;HOB elevated Bed/Chair Transfer: 4: Supine > Sit: Min A (steadying Pt. > 75%/lift 1 leg);4: Bed > Chair or W/C: Min A (steadying Pt. > 75%);4: Chair or W/C > Bed: Min A (steadying Pt. > 75%)  FIM -  Locomotion: Wheelchair Distance: 175' Locomotion: Wheelchair: 5: Travels 150 ft or more: maneuvers on rugs and over door sills with supervision, cueing or coaxing FIM -  Locomotion: Ambulation Locomotion: Ambulation Assistive Devices: Administrator Ambulation/Gait Assistance: 4: Min assist Locomotion: Ambulation: 2: Travels 50 - 149 ft with minimal assistance (Pt.>75%)  Comprehension Comprehension Mode: Auditory Comprehension: 4-Understands basic 75 - 89% of the time/requires cueing 10 - 24% of the time  Expression Expression Mode: Verbal Expression: 5-Expresses basic needs/ideas: With no assist  Social Interaction Social Interaction: 5-Interacts appropriately 90% of the time - Needs monitoring or encouragement for participation or interaction.  Problem Solving Problem Solving: 3-Solves basic 50 - 74% of the time/requires cueing 25 - 49% of the time  Memory Memory: 3-Recognizes or recalls 50 - 74% of the time/requires cueing 25 - 49% of the time  Medical Problem List and Plan:  1. Neurosyphilis: s/p PCN 2. DVT Prophylaxis/Anticoagulation: Subcutaneous heparin.    3. Pain Management: Tylenol as needed  4. Mood/depression: Lexapro 5 mg daily. Followup per psychiatry services   -mood has been pleasant and up beat thus far over the first two days of rehab  -don't how much credibility can be given to his suicidal ideations given his cognitive status  -sitter for now 5. Neuropsych: This patient is not capable of making decisions on his own behalf.  6. Urticarial drug rash-felt to be secondary penicillin. Rash improving prednisone taper. Contact precautions for open lesions.   7. COPD. Check oxygen saturations every shift  8. Heart block status post pacemaker. Chest pain or shortness of breath  9. Hypertension. Tenormin 25 mg daily. Monitor with increased mobility 10. FEN: encourage po fluids--bmet better today---recheck next week.  11. HIV/ID: truvada, tivicay per ID  -will d/w ID whether we need to repeat is interferon gamma test for TB  LOS (Days) 2 A FACE TO FACE EVALUATION WAS PERFORMED  [name] 06/23/2013 7:48 AM

## 2013-06-24 ENCOUNTER — Inpatient Hospital Stay (HOSPITAL_COMMUNITY): Payer: Medicare Other | Admitting: Physical Therapy

## 2013-06-24 ENCOUNTER — Encounter (HOSPITAL_COMMUNITY): Payer: Medicare Other | Admitting: Occupational Therapy

## 2013-06-24 ENCOUNTER — Inpatient Hospital Stay (HOSPITAL_COMMUNITY): Payer: Self-pay | Admitting: Physical Therapy

## 2013-06-24 ENCOUNTER — Inpatient Hospital Stay (HOSPITAL_COMMUNITY): Payer: Medicare Other | Admitting: Speech Pathology

## 2013-06-24 ENCOUNTER — Inpatient Hospital Stay (HOSPITAL_COMMUNITY): Payer: Self-pay | Admitting: Occupational Therapy

## 2013-06-24 DIAGNOSIS — Z21 Asymptomatic human immunodeficiency virus [HIV] infection status: Secondary | ICD-10-CM

## 2013-06-24 DIAGNOSIS — B20 Human immunodeficiency virus [HIV] disease: Secondary | ICD-10-CM | POA: Diagnosis not present

## 2013-06-24 DIAGNOSIS — F0280 Dementia in other diseases classified elsewhere without behavioral disturbance: Secondary | ICD-10-CM

## 2013-06-24 LAB — GLUCOSE, CAPILLARY
GLUCOSE-CAPILLARY: 163 mg/dL — AB (ref 70–99)
Glucose-Capillary: 89 mg/dL (ref 70–99)
Glucose-Capillary: 107 mg/dL — ABNORMAL HIGH (ref 70–99)
Glucose-Capillary: 176 mg/dL — ABNORMAL HIGH (ref 70–99)

## 2013-06-24 NOTE — Plan of Care (Signed)
Problem: RH BLADDER ELIMINATION Goal: RH STG MANAGE BLADDER WITH ASSISTANCE STG Manage Bladder With min Assistance  Outcome: Not Progressing I/O caths for urinary retention

## 2013-06-24 NOTE — Progress Notes (Signed)
Occupational Therapy Session Note  Patient Details  Name: Shaun Lutz MRN: [id number] Date of Birth: 1955/06/23  Today's Date: 06/24/2013 Time: 0800-0858 Time Calculation (min): 58 min  Short Term Goals: Week 1:  OT Short Term Goal 1 (Week 1): STG=LTG due to anticipated briefl length of stay OT Short Term Goal 2 (Week 1): Pateint will complete upper body bathing sitting at sink with   Skilled Therapeutic Interventions/Progress Updates:    Pt performed toileting, bathing, and dressing during session this am.  Min assist for transfer to the bathroom from the EOB with min hand held assist.  Pt able to perform initial hygiene and clothing management with min assist sit to stand.  He required max assist to remove his socks and sweat pants in order to complete shower.  Mod instructional cueing for thoroughness during shower sit to stand.  While walking over to the dresser to retrieve his clothing pt demonstrated increased knee flexion and needed mod instructional cueing and min assist to extend his knees.  When asked to get out his clothing to wear he opened the drawers and then closed them without retrieving any items.  He then needed a second cue to get out pants and shirt, which he was able to do with steady assist.  Mod assist needed for LB dressing to cross and maintain the LLE over the right to donn his sock and slip on shoe.  He was able to cross over the LLE for dressing.    Therapy Documentation Precautions:  Precautions Precautions: Fall Precaution Comments: body tremors during functional mobility  Restrictions Weight Bearing Restrictions: No  Vital Signs: HR 102 and O2 sats 95% on room air during bathing and dressing tasks Pain: Pain Assessment Pain Assessment: No/denies pain ADL: See FIM for current functional status  Therapy/Group: Individual Therapy  Cindra Presume OTR/L 06/24/2013, 9:01 AM

## 2013-06-24 NOTE — Progress Notes (Signed)
Physical Therapy Session Note  Patient Details  Name: Shaun Lutz MRN: [id number] Date of Birth: 08/30/1955  Today's Date: 06/24/2013 Time: 0900-1000 Time Calculation (min): 60 min  Short Term Goals: Week 1:  PT Short Term Goal 1 (Week 1): STGs=LTGs due to anticipated LOS   Therapy Documentation Precautions:  Precautions Precautions: Fall Precaution Comments: body tremors during functional mobility  Restrictions Weight Bearing Restrictions: No Pain: Pain Assessment Pain Assessment: No/denies pain  Therapeutic Activity:(15')Transfer training sit<->stand and w/c<->chair with S/min-assist Therapeutic Exercise:(30') Nu-Step x 20 minutes Level 4 with rest breaks every 2 to 3 minutes. Gait Training:(15') using RW 2 x 140' and 2 x 30' with S/Mod-Independent, no LOB noted.   Therapy/Group: Individual Therapy  Clearence Ped 06/24/2013, 9:12 AM

## 2013-06-24 NOTE — Progress Notes (Signed)
Speech Language Pathology Daily Session Note  Patient Details  Name: Shaun Lutz MRN: [id number] Date of Birth: May 23, 1955  Today's Date: 06/24/2013 Time: 1400-1430 Time Calculation (min): 30 min  Short Term Goals: Week 1: SLP Short Term Goal 1 (Week 1): STGs = LTGs due to anticipated brief length of stay  Skilled Therapeutic Interventions: Skilled ST intervention provided with focus on cognitive goals. Pt seen in room for ST tx and participated in all tasks willingly. Pt oriented to self, time, place, disoriented to situation. Pt required cueing to utilize external aides to orient to date. Pt was 75% accurate with identifying dangerous situation in given picture cards and coming up with appropriate solution. Pt was 40% accurate with 4-step sequencing cards, given min-mod assist. Slp reviewed call bell use and safety precautions.    FIM:  Comprehension Comprehension Mode: Auditory Comprehension: 4-Understands basic 75 - 89% of the time/requires cueing 10 - 24% of the time Expression Expression Mode: Verbal Expression: 5-Expresses basic needs/ideas: With extra time/assistive device Social Interaction Social Interaction: 4-Interacts appropriately 75 - 89% of the time - Needs redirection for appropriate language or to initiate interaction. Problem Solving Problem Solving: 3-Solves basic 50 - 74% of the time/requires cueing 25 - 49% of the time Memory Memory: 2-Recognizes or recalls 25 - 49% of the time/requires cueing 51 - 75% of the time  Pain Pain Assessment Pain Assessment: No/denies pain  Therapy/Group: Individual Therapy  [name] 06/24/2013, 4:00 PM

## 2013-06-24 NOTE — Progress Notes (Signed)
Occupational Therapy Session Note  Patient Details  Name: [name] MRN: [id number] Date of Birth: April 12, 1955  Today's Date: 06/24/2013 Time: 7353-2992 Time Calculation (min): 45 min  Skilled Therapeutic Interventions/Progress Updates:    Pt stood at the sink with min guard assist to wash his hands before ambulating down to the gym with min guard assist as well.  Once he sat down noted dyspnea 2/4 with HR at 84 BPM and O2 sats 95% on room air.  Worked on sit to stand and standing balance during functional activity.  Pt able to perform sit to stand with use of UEs but min assist without them.  Had pt sit to retrieve pieces of PVC pipe and then stand to put together with reference to visual diagram.  Pt able to replicate article close to picture given but not able to correctly identify different lengths to scale.  Pt pleasant throughout session.  Returned to room via wheelchair with waist belt in place and call bell in reach.   Therapy Documentation Precautions:  Precautions Precautions: Fall Precaution Comments: body tremors during functional mobility  Restrictions Weight Bearing Restrictions: No   Pain: Pain Assessment Pain Assessment: No/denies pain Pain Score: 0-No pain ADL: See FIM for current functional status  Therapy/Group: Individual Therapy  Cindra Presume OTR/L 06/24/2013, 3:22 PM

## 2013-06-24 NOTE — Progress Notes (Signed)
Patient ID: Shaun Lutz, male   DOB: 03/17/1955, 58 y.o.   MRN: [id number]   [place] [facility]IC[facility] & REHABILITATION     PROGRESS NOTE   06/24/13.  Subjective/Complaints:  58 year old patient admitted with physical deconditioning with history of HIV neurosyphilis, and malnutrition.  Medical issues include history of AV block status post pacemaker insertion;  has a history of alcohol abuse.  Mood pleasant. Slept well. In good spirits. A 12 point review of systems has been performed and if not noted above is otherwise negative.   Past Medical History  Diagnosis Date  . Hypertension   . Second degree AV block   . Syncope   . Pacemaker -Medtronic     DOI 2012    History   Social History  . Marital Status: Divorced    Spouse Name: N/A    Number of Children: N/A  . Years of Education: N/A   Occupational History  . Not on file.   Social History Main Topics  . Smoking status: Current Every Day Smoker -- 0.25 packs/day for 35 years    Types: Cigarettes  . Smokeless tobacco: Not on file  . Alcohol Use: No  . Drug Use: No  . Sexual Activity: Not on file   Other Topics Concern  . Not on file   Social History Narrative  . No narrative on file    Past Surgical History  Procedure Laterality Date  . Pacemaker insertion      Family History  Problem Relation Age of Onset  . Cancer Mother   . Heart disease Father   . Heart disease Sister   . Heart disease Brother     Allergies  Allergen Reactions  . Penicillins     No current facility-administered medications on file prior to encounter.   Current Outpatient Prescriptions on File Prior to Encounter  Medication Sig Dispense Refill  . aspirin 81 MG tablet Take 1 tablet (81 mg total) by mouth daily.  180 tablet  2  . atenolol (TENORMIN) 25 MG tablet Take 25 mg by mouth daily.      . dolutegravir (TIVICAY) 50 MG tablet Take 1 tablet (50 mg total) by mouth daily.  30 tablet  0  . emtricitabine-tenofovir (TRUVADA)  200-300 MG per tablet Take 1 tablet by mouth daily.  30 tablet  0  . escitalopram (LEXAPRO) 5 MG tablet Take 1 tablet (5 mg total) by mouth at bedtime.  30 tablet  0  . feeding supplement, ENSURE COMPLETE, (ENSURE COMPLETE) LIQD Take 237 mLs by mouth 3 (three) times daily.  1 Bottle  0  . haloperidol (HALDOL) 0.5 MG tablet Take 1 tablet (0.5 mg total) by mouth at bedtime.  30 tablet  0  . heparin 5000 UNIT/ML injection Inject 1 mL (5,000 Units total) into the skin every 8 (eight) hours.  1 mL  0  . insulin aspart (NOVOLOG) 100 UNIT/ML injection Inject 0-5 Units into the skin at bedtime.  10 mL  11  . insulin aspart (NOVOLOG) 100 UNIT/ML injection Inject 0-9 Units into the skin 3 (three) times daily with meals.  10 mL  11  . Nutritional Supplements (ENSURE ACTIVE HIGH PROTEIN) LIQD Take 1 Can by mouth 3 (three) times daily.  60 Can  2    BP 132/88  Pulse 84  Temp(Src) 98.1 F (36.7 C) (Oral)  Resp 16  Ht 5\' 8"  (1.727 m)  Wt 58.106 kg (128 lb 1.6 oz)  BMI 19.48 kg/m2  SpO2  97%   Patient Vitals for the past 24 hrs:  BP Temp Temp src Pulse Resp SpO2  06/24/13 0500 132/88 mmHg 98.1 F (36.7 C) Oral 84 16 97 %  06/23/13 2100 103/64 mmHg 97.9 F (36.6 C) Oral 72 18 99 %  06/23/13 1500 96/59 mmHg 98 F (36.7 C) Oral 76 17 97 %     Intake/Output Summary (Last 24 hours) at 06/24/13 0949 Last data filed at 06/24/13 0900  Gross per 24 hour  Intake    840 ml  Output   1676 ml  Net   -836 ml     Objective: Vital Signs: Blood pressure 132/88, pulse 84, temperature 98.1 F (36.7 C), temperature source Oral, resp. rate 16, height 5\' 8"  (1.727 m), weight 58.106 kg (128 lb 1.6 oz), SpO2 97.00%. No results found.  Recent Labs  06/21/13 1750 06/22/13 0620  WBC 13.1* 9.6  HGB 11.3* 10.6*  HCT 34.2* 32.6*  PLT 356 323    Recent Labs  06/22/13 0620 06/23/13 0600  NA 143 140  K 4.6 4.6  CL 106 102  GLUCOSE 115* 101*  BUN 31* 24*  CREATININE 0.93 0.75  CALCIUM 8.9 8.3*    CBG (last 3)   Recent Labs  06/23/13 1641 06/23/13 2047 06/24/13 0648  GLUCAP 151* 151* 89    Wt Readings from Last 3 Encounters:  06/22/13 58.106 kg (128 lb 1.6 oz)  06/18/13 54.3 kg (119 lb 11.4 oz)  06/07/13 52.481 kg (115 lb 11.2 oz)    Physical Exam:  Constitutional:  58 year old male appearing older than stated age.  HENT:  Poor dentition/ oral mucosa pink/moist  Eyes: EOM are normal.  Neck: Normal range of motion. Neck supple. No thyromegaly present.  Cardiovascular: Normal rate and regular rhythm. No murmur  Respiratory: Effort normal and breath sounds normal. No respiratory distress. No wheezes  GI: Soft. Bowel sounds are normal. He exhibits no distension.  Neurological: He is alert.   Limited insight and awareness.  intention tremor bilateral lower extremities greater than bilateral upper extremities  Motor strength is 4+/5 bilateral deltoid, bicep, tricep, grip bilaterally. LE's 3+ prox HF, 4- KE and 4/5 at ankles.  Skin: vitiligo diffusely,--some exfoliation of facial skin Psych: calm, non-distressed     Assessment/Plan:  Medical Problem List and Plan:  1. Neurosyphilis: s/p PCN  2. Mood/depression: Lexapro 5 mg daily. Followup per psychiatry services   -mood has been pleasant and up beat thus far over the first two days of rehab  -don't how much credibility can be given to his suicidal ideations given his cognitive status  -sitter for now 3. Neuropsych: This patient is not capable of making decisions on his own behalf.  4. Urticarial drug rash-felt to be secondary penicillin. Rash improving prednisone taper. Contact precautions for open lesions.   5. COPD. Check oxygen saturations every shift  6. Heart block status post pacemaker. Chest pain or shortness of breath  7. Hypertension. Tenormin 25 mg daily. Monitor with increased mobility  8. HIV/ID: truvada, tivicay per ID  -will d/w ID whether we need to repeat is interferon gamma test for TB  LOS  (Days) 3 A FACE TO FACE EVALUATION WAS PERFORMED  Marletta Lor 06/24/2013 9:47 AM

## 2013-06-25 ENCOUNTER — Inpatient Hospital Stay (HOSPITAL_COMMUNITY): Payer: Medicare Other | Admitting: *Deleted

## 2013-06-25 DIAGNOSIS — E440 Moderate protein-calorie malnutrition: Secondary | ICD-10-CM | POA: Diagnosis not present

## 2013-06-25 DIAGNOSIS — Z21 Asymptomatic human immunodeficiency virus [HIV] infection status: Secondary | ICD-10-CM | POA: Diagnosis not present

## 2013-06-25 DIAGNOSIS — B20 Human immunodeficiency virus [HIV] disease: Secondary | ICD-10-CM | POA: Diagnosis not present

## 2013-06-25 DIAGNOSIS — F0280 Dementia in other diseases classified elsewhere without behavioral disturbance: Secondary | ICD-10-CM | POA: Diagnosis not present

## 2013-06-25 LAB — GLUCOSE, CAPILLARY
GLUCOSE-CAPILLARY: 97 mg/dL (ref 70–99)
GLUCOSE-CAPILLARY: 136 mg/dL — AB (ref 70–99)
Glucose-Capillary: 117 mg/dL — ABNORMAL HIGH (ref 70–99)
Glucose-Capillary: 162 mg/dL — ABNORMAL HIGH (ref 70–99)

## 2013-06-25 NOTE — Plan of Care (Signed)
Problem: RH BLADDER ELIMINATION Goal: RH STG MANAGE BLADDER WITH ASSISTANCE STG Manage Bladder With min Assistance  Outcome: Not Progressing Requires I/O caths due to retention

## 2013-06-25 NOTE — Progress Notes (Addendum)
Occupational Therapy Session Note  Patient Details  Name: Shaun Lutz MRN: [id number] Date of Birth: [birth year]  Today's Date: 06/25/2013 Time:  - 1130-1215  (45 min)    Short Term Goals: Week 1:  OT Short Term Goal 1 (Week 1): STG=LTG due to anticipated briefl length of stay OT Short Term Goal 2 (Week 1): Pateint will complete upper body bathing sitting at sink with   Skilled Therapeutic Interventions/Progress Updates:    Addressed standing balnace and sit to stand during BADL activities.  Pt lying in bed upon OT arrival.  Pt. Reported he had already bathed and dressed, but after getting to EOB realized his depends was soiled. Pt. Also reported at this point that he was not dressed from waist down.   Pt. Stood to wah peri area with SBA and RW for one hand hold.  He stood for 5 mins, rested, stood 5 minutes, rested, stood 5 mins, rested.  Nursing called in to make dressing changes. Pt. fatigued at end of session.  Returned to supine for nursing  to do bladder scan.  .    Therapy Documentation Precautions:  Precautions Precautions: Fall Precaution Comments: body tremors during functional mobility  Restrictions Weight Bearing Restrictions: No       Pain: Pain Assessment Pain Assessment: No/denies pain Pain Score: 0-No pain ADL: ADL ADL Comments: see FIM        See FIM for current functional status  Therapy/Group: Individual Therapy  Lisa Roca 06/25/2013, 11:31 AM

## 2013-06-25 NOTE — Progress Notes (Signed)
Patient ID: Kaion Tisdale, male   DOB: 1955-03-16, 58 y.o.   MRN: [id number]  Patient ID: Aubert Choyce, male   DOB: 02-21-1956, 58 y.o.   MRN: [id number]   [facility] [place] PHYSICAL MEDICINE & REHABILITATION     PROGRESS NOTE  06/25/13.   Subjective/Complaints:  58 year old patient admitted with physical deconditioning with history of HIV neurosyphilis, and malnutrition.  Medical issues include history of AV block status post pacemaker insertion;  has a history of alcohol abuse.  Mood pleasant. Slept well. In good spirits.  No c/os. A 12 point review of systems has been performed and if not noted above is otherwise negative.   Past Medical History  Diagnosis Date  . Hypertension   . Second degree AV block   . Syncope   . Pacemaker -Medtronic     DOI 2012    History   Social History  . Marital Status: Divorced    Spouse Name: N/A    Number of Children: N/A  . Years of Education: N/A   Occupational History  . Not on file.   Social History Main Topics  . Smoking status: Current Every Day Smoker -- 0.25 packs/day for 35 years    Types: Cigarettes  . Smokeless tobacco: Not on file  . Alcohol Use: No  . Drug Use: No  . Sexual Activity: Not on file   Other Topics Concern  . Not on file   Social History Narrative  . No narrative on file    Past Surgical History  Procedure Laterality Date  . Pacemaker insertion      Family History  Problem Relation Age of Onset  . Cancer Mother   . Heart disease Father   . Heart disease Sister   . Heart disease Brother     Allergies  Allergen Reactions  . Penicillins     No current facility-administered medications on file prior to encounter.   Current Outpatient Prescriptions on File Prior to Encounter  Medication Sig Dispense Refill  . aspirin 81 MG tablet Take 1 tablet (81 mg total) by mouth daily.  180 tablet  2  . atenolol (TENORMIN) 25 MG tablet Take 25 mg by mouth daily.      . dolutegravir (TIVICAY) 50 MG tablet Take 1  tablet (50 mg total) by mouth daily.  30 tablet  0  . emtricitabine-tenofovir (TRUVADA) 200-300 MG per tablet Take 1 tablet by mouth daily.  30 tablet  0  . escitalopram (LEXAPRO) 5 MG tablet Take 1 tablet (5 mg total) by mouth at bedtime.  30 tablet  0  . feeding supplement, ENSURE COMPLETE, (ENSURE COMPLETE) LIQD Take 237 mLs by mouth 3 (three) times daily.  1 Bottle  0  . haloperidol (HALDOL) 0.5 MG tablet Take 1 tablet (0.5 mg total) by mouth at bedtime.  30 tablet  0  . heparin 5000 UNIT/ML injection Inject 1 mL (5,000 Units total) into the skin every 8 (eight) hours.  1 mL  0  . insulin aspart (NOVOLOG) 100 UNIT/ML injection Inject 0-5 Units into the skin at bedtime.  10 mL  11  . insulin aspart (NOVOLOG) 100 UNIT/ML injection Inject 0-9 Units into the skin 3 (three) times daily with meals.  10 mL  11  . Nutritional Supplements (ENSURE ACTIVE HIGH PROTEIN) LIQD Take 1 Can by mouth 3 (three) times daily.  60 Can  2    BP 128/84  Pulse 82  Temp(Src) 98 F (36.7 C) (Oral)  Resp 18  Ht 5\' 8"  (1.727 m)  Wt 58.106 kg (128 lb 1.6 oz)  BMI 19.48 kg/m2  SpO2 100%   Patient Vitals for the past 24 hrs:  BP Temp Temp src Pulse Resp SpO2  06/25/13 0626 128/84 mmHg 98 F (36.7 C) Oral 82 18 100 %  06/24/13 2120 101/63 mmHg 97.8 F (36.6 C) Oral 91 16 99 %  06/24/13 1447 95/63 mmHg 98.1 F (36.7 C) Oral 77 16 97 %     Intake/Output Summary (Last 24 hours) at 06/25/13 0850 Last data filed at 06/25/13 0500  Gross per 24 hour  Intake    720 ml  Output   1161 ml  Net   -441 ml     Objective: Vital Signs: Blood pressure 128/84, pulse 82, temperature 98 F (36.7 C), temperature source Oral, resp. rate 18, height 5\' 8"  (1.727 m), weight 58.106 kg (128 lb 1.6 oz), SpO2 100.00%. No results found. No results found for this basename: WBC, HGB, HCT, PLT,  in the last 72 hours  Recent Labs  06/23/13 0600  NA 140  K 4.6  CL 102  GLUCOSE 101*  BUN 24*  CREATININE 0.75  CALCIUM 8.3*    CBG (last 3)   Recent Labs  06/24/13 1618 06/24/13 2027 06/25/13 0729  GLUCAP 163* 176* 97    Wt Readings from Last 3 Encounters:  06/22/13 58.106 kg (128 lb 1.6 oz)  06/18/13 54.3 kg (119 lb 11.4 oz)  06/07/13 52.481 kg (115 lb 11.2 oz)    Physical Exam:  Constitutional:  58 year old male appearing older than stated age.  HENT:  Poor dentition/ oral mucosa pink/moist  Eyes: EOM are normal.  Neck: Normal range of motion. Neck supple. No thyromegaly present.  Cardiovascular: Normal rate and regular rhythm. No murmur  Respiratory: Effort normal and breath sounds normal. No respiratory distress. No wheezes  GI: Soft. Bowel sounds are normal. He exhibits no distension.  Neurological: He is alert.   Limited insight and awareness.  intention tremor bilateral lower extremities greater than bilateral upper extremities  Motor strength is 4+/5 bilateral deltoid, bicep, tricep, grip bilaterally. LE's 3+ prox HF, 4- KE and 4/5 at ankles.  Skin: vitiligo diffusely,--some exfoliation of facial skin Psych: calm, non-distressed     Assessment/Plan:  Medical Problem List and Plan:  1. Neurosyphilis: s/p PCN  2. Mood/depression: Lexapro 5 mg daily. Followup per psychiatry services   -mood has been pleasant and up beat thus far over the first two days of rehab  -don't how much credibility can be given to his suicidal ideations given his cognitive status  -sitter for now 3. Neuropsych: This patient is not capable of making decisions on his own behalf.  4. Urticarial drug rash-felt to be secondary penicillin. Rash improving prednisone taper. Contact precautions for open lesions.   5. COPD. Check oxygen saturations every shift  6. Heart block status post pacemaker. Chest pain or shortness of breath  7. Hypertension. Tenormin 25 mg daily. Monitor with increased mobility  8. HIV/ID: truvada, tivicay per ID  -will d/w ID whether we need to repeat is interferon gamma test for TB  LOS  (Days) 4 A FACE TO FACE EVALUATION WAS PERFORMED  Marletta Lor 06/25/2013 8:50 AM

## 2013-06-26 ENCOUNTER — Inpatient Hospital Stay (HOSPITAL_COMMUNITY): Payer: Self-pay

## 2013-06-26 ENCOUNTER — Inpatient Hospital Stay (HOSPITAL_COMMUNITY): Payer: Medicare Other | Admitting: Speech Pathology

## 2013-06-26 ENCOUNTER — Encounter (HOSPITAL_COMMUNITY): Payer: Self-pay

## 2013-06-26 DIAGNOSIS — A5211 Tabes dorsalis: Secondary | ICD-10-CM | POA: Diagnosis not present

## 2013-06-26 DIAGNOSIS — R5381 Other malaise: Secondary | ICD-10-CM | POA: Diagnosis not present

## 2013-06-26 LAB — GLUCOSE, CAPILLARY
GLUCOSE-CAPILLARY: 154 mg/dL — AB (ref 70–99)
Glucose-Capillary: 100 mg/dL — ABNORMAL HIGH (ref 70–99)
Glucose-Capillary: 158 mg/dL — ABNORMAL HIGH (ref 70–99)
Glucose-Capillary: 202 mg/dL — ABNORMAL HIGH (ref 70–99)

## 2013-06-26 LAB — URINALYSIS, ROUTINE W REFLEX MICROSCOPIC
Bilirubin Urine: NEGATIVE
Glucose, UA: NEGATIVE mg/dL
Hgb urine dipstick: NEGATIVE
Ketones, ur: NEGATIVE mg/dL
Leukocytes, UA: NEGATIVE
Nitrite: NEGATIVE
Protein, ur: NEGATIVE mg/dL
Specific Gravity, Urine: 1.02 (ref 1.005–1.030)
UROBILINOGEN UA: 2 mg/dL — AB (ref 0.0–1.0)
pH: 7 (ref 5.0–8.0)

## 2013-06-26 MED ORDER — BETHANECHOL CHLORIDE 10 MG PO TABS
10.0000 mg | ORAL_TABLET | Freq: Three times a day (TID) | ORAL | Status: DC
  Administered 2013-06-26 – 2013-06-27 (×4): 10 mg via ORAL
  Filled 2013-06-26 (×6): qty 1

## 2013-06-26 MED ORDER — TAMSULOSIN HCL 0.4 MG PO CAPS
0.4000 mg | ORAL_CAPSULE | Freq: Every day | ORAL | Status: DC
  Administered 2013-06-26 – 2013-06-29 (×4): 0.4 mg via ORAL
  Filled 2013-06-26 (×5): qty 1

## 2013-06-26 NOTE — Progress Notes (Signed)
Physical Therapy Session Note  Patient Details  Name: Shaun Lutz MRN: [id number] Date of Birth: Oct 15, 1955  Today's Date: 06/26/2013 Time: 0930-1020 Time Calculation (min): 50 min  Short Term Goals: Week 1:  PT Short Term Goal 1 (Week 1): STGs=LTGs due to anticipated LOS  Skilled Therapeutic Interventions/Progress Updates:  1:1. Pt received sitting in w/c, ready for therapy. Focus this session on functional endurance, toileting, pressure relief and stair negotiation. Pt's w/c cushions swapped for more appropriate pressure relief cushion due to wound on bottom. Education regarding pressure relief in both sitting and supine. Pt propelled w/c 175' w/ (S) and amb 150'x1 and 100'x2 w/ RW, req very close (S)-min A due to fatigue. Pt needing to lightly sit on therapists knee 3x due to B LE giving out before pt able to state need for rest break. Pt's girlfriend present to observe session, educated regarding pt's s/s of fatigue, keeping w/c close or placement of chairs around house to prevent fall. Pt req min A for management of clothing and steadying assist during toileting due to fatigue. Pt practiced negotiation up/down 8 steps w/ single rail using step-to pattern for improved safety and energy conservation. Pt req (S) for t/f sit>sup at end of session, lying in L sidelying w/ all needs in reach, bed alarm on and girlfriend in room.    Pt missed 98min at end of session due to fatigue.   Therapy Documentation Precautions:  Precautions Precautions: Fall Precaution Comments: body tremors during functional mobility  Restrictions Weight Bearing Restrictions: No  See FIM for current functional status  Therapy/Group: Individual Therapy  [name] 06/26/2013, 10:24 AM

## 2013-06-26 NOTE — Progress Notes (Signed)
[facility] PHYSICAL MEDICINE & REHABILITATION     PROGRESS NOTE    Subjective/Complaints: No problems over the weekend. Confused still. Mood is up beat. Required I/O cath due to retention A 12 point review of systems has been performed and if not noted above is otherwise negative.   Objective: Vital Signs: Blood pressure 145/82, pulse 79, temperature 98.6 F (37 C), temperature source Oral, resp. rate 18, height 5\' 8"  (1.727 m), weight 58.106 kg (128 lb 1.6 oz), SpO2 96.00%. No results found. No results found for this basename: WBC, HGB, HCT, PLT,  in the last 72 hours No results found for this basename: NA, K, CL, CO, GLUCOSE, BUN, CREATININE, CALCIUM,  in the last 72 hours CBG (last 3)   Recent Labs  06/25/13 1635 06/25/13 2028 06/26/13 0715  GLUCAP 162* 136* 100*    Wt Readings from Last 3 Encounters:  06/22/13 58.106 kg (128 lb 1.6 oz)  06/18/13 54.3 kg (119 lb 11.4 oz)  06/07/13 52.481 kg (115 lb 11.2 oz)    Physical Exam:  Constitutional:  58 year old male appearing older than stated age.  HENT:  Poor dentition/ oral mucosa pink/moist  Eyes: EOM are normal.  Neck: Normal range of motion. Neck supple. No thyromegaly present.  Cardiovascular: Normal rate and regular rhythm. No murmur  Respiratory: Effort normal and breath sounds normal. No respiratory distress. No wheezes  GI: Soft. Bowel sounds are normal. He exhibits no distension.  Neurological: He is alert.  Patient follows simple commands. He was able to provide his name and place. Knew he was on inpatient rehab. He was unsure of his age.   Limited insight and awareness.  intention tremor bilateral lower extremities greater than bilateral upper extremities  Motor strength is 4+/5 bilateral deltoid, bicep, tricep, grip bilaterally. LE's 3+ prox HF, 4- KE and 4/5 at ankles.  Skin: vitiligo diffusely, diffuse maculopapular drug rash which is resolving--some exfoliation Psych: calm, non-distressed      Assessment/Plan: 1. Functional deficits secondary to neurosyphilis which require 3+ hours per day of interdisciplinary therapy in a comprehensive inpatient rehab setting. Physiatrist is providing close team supervision and 24 hour management of active medical problems listed below. Physiatrist and rehab team continue to assess barriers to discharge/monitor patient progress toward functional and medical goals. FIM: FIM - Bathing Bathing Steps Patient Completed: Chest;Right Arm;Left Arm;Abdomen;Front perineal area;Buttocks;Right upper leg;Right lower leg (including foot);Left upper leg;Left lower leg (including foot) Bathing: 4: Steadying assist  FIM - Upper Body Dressing/Undressing Upper body dressing/undressing steps patient completed: Thread/unthread right sleeve of pullover shirt/dresss;Thread/unthread left sleeve of pullover shirt/dress;Put head through opening of pull over shirt/dress;Pull shirt over trunk Upper body dressing/undressing: 5: Set-up assist to: Obtain clothing/put away FIM - Lower Body Dressing/Undressing Lower body dressing/undressing steps patient completed: Thread/unthread right pants leg;Thread/unthread left pants leg;Pull pants up/down;Fasten/unfasten pants;Don/Doff left sock;Don/Doff left shoe Lower body dressing/undressing: 4: Min-Patient completed 75 plus % of tasks  FIM - Toileting Toileting: 0: Activity did not occur  FIM - Radio producer Devices: Product manager Transfers: 0-Activity did not occur  FIM - Control and instrumentation engineer Devices: Environmental consultant;Bed rails Bed/Chair Transfer: 4: Bed > Chair or W/C: Min A (steadying Pt. > 75%);4: Chair or W/C > Bed: Min A (steadying Pt. > 75%);4: Sit > Supine: Min A (steadying pt. > 75%/lift 1 leg);4: Supine > Sit: Min A (steadying Pt. > 75%/lift 1 leg)  FIM - Locomotion: Wheelchair Distance: 175' Locomotion: Wheelchair: 5: Travels 150 ft or more:  maneuvers on rugs and  over door sills with supervision, cueing or coaxing FIM - Locomotion: Ambulation Locomotion: Ambulation Assistive Devices: Walker - Rolling Ambulation/Gait Assistance: 4: Min guard;5: Supervision Locomotion: Ambulation: 4: Travels 150 ft or more with minimal assistance (Pt.>75%)  Comprehension Comprehension Mode: Auditory Comprehension: 4-Understands basic 75 - 89% of the time/requires cueing 10 - 24% of the time  Expression Expression Mode: Verbal Expression: 5-Expresses basic needs/ideas: With no assist  Social Interaction Social Interaction: 5-Interacts appropriately 90% of the time - Needs monitoring or encouragement for participation or interaction.  Problem Solving Problem Solving: 3-Solves basic 50 - 74% of the time/requires cueing 25 - 49% of the time  Memory Memory: 3-Recognizes or recalls 50 - 74% of the time/requires cueing 25 - 49% of the time  Medical Problem List and Plan:  1. Neurosyphilis: s/p PCN 2. DVT Prophylaxis/Anticoagulation: Subcutaneous heparin.    3. Pain Management: Tylenol as needed  4. Mood/depression: Lexapro 5 mg daily. Followup per psychiatry services   -mood has been pleasant and up beat thus far over the first two days of rehab  -don't how much credibility can be given to his suicidal ideations given his cognitive status  -sitter still---dc soon? 5. Neuropsych: This patient is not capable of making decisions on his own behalf.  6. Urticarial drug rash-felt to be secondary penicillin. Rash improving prednisone taper. Contact precautions for open lesions.   7. COPD. Check oxygen saturations every shift  8. Heart block status post pacemaker. Chest pain or shortness of breath  9. Hypertension. Tenormin 25 mg daily. Monitor with increased mobility 10. FEN: encourage po fluids--bmet better today---recheck tomorrow.  11. HIV/ID: truvada, tivicay per ID  -ID will follow up  whether we need to repeat his interferon gamma test for TB 12. Urine retention:  recheck Ua and culture  -flomax and urecholine trial  LOS (Days) 5 A FACE TO FACE EVALUATION WAS PERFORMED  [name] 06/26/2013 8:11 AM

## 2013-06-26 NOTE — Progress Notes (Signed)
Occupational Therapy Session Note  Patient Details  Name: Shaun Lutz MRN: [id number] Date of Birth: 05-15-1955  Today's Date: 06/26/2013 Time: 0700-0755 Time Calculation (min): 55 min  Short Term Goals: Week 1:  OT Short Term Goal 1 (Week 1): STG=LTG due to anticipated briefl length of stay OT Short Term Goal 2 (Week 1): Pateint will complete upper body bathing sitting at sink with   Skilled Therapeutic Interventions/Progress Updates:    Pt engaged in bathing at shower level and dressing with sit<>stand from w/c at sink.  Pt amb with RW into and out of bathroom requiring min A.  Pt required min verbal cues for task initiation and sequencing during shower.  Pt required extra time to complete all tasks with multiple rest breaks throughout session.  Focus on activity tolerance, safety awareness, bed mobility, transfers, functional amb with RW, task initiation, and sequencing.  Therapy Documentation Precautions:  Precautions Precautions: Fall Precaution Comments: body tremors during functional mobility  Restrictions Weight Bearing Restrictions: No Pain: Pain Assessment Pain Assessment: No/denies pain ADL: ADL ADL Comments: see FIM  See FIM for current functional status  Therapy/Group: Individual Therapy  Leroy Libman 06/26/2013, 8:00 AM

## 2013-06-26 NOTE — Progress Notes (Signed)
[place] contact precautions discontinued per Infectious Disease Nurse.

## 2013-06-26 NOTE — Progress Notes (Signed)
Physical Therapy Session Note  Patient Details  Name: Shaun Lutz MRN: [id number] Date of Birth: 1955/06/30  Today's Date: 06/26/2013 Time: 9233-0076 Time Calculation (min): 30 min  Short Term Goals: Week 1:  PT Short Term Goal 1 (Week 1): STGs=LTGs due to anticipated LOS  Skilled Therapeutic Interventions/Progress Updates:  1:1. Pt received semi-reclined in bed, ready for therapy. Pt practiced log roll for t/f R sidelying>sit EOB w/ HOB flat, no bed rails with supervision and min cueing for technique. Reviewed pressure relief techniques/timing in sitting. Focus majority of session on functional strength/endurance w/ good tolerance overall. Pt able to safely amb room>therapy gym w/ RW, close(S), but propelled w/c therapy gym>room w/ B UE due to fatigue. Pt utilized NuStep w/ B UE/LE, level 3x68min. Pt req min A for t/f sit>sup at end of session. Pt in L sidelying at end of session w/ all needs in reach, bed alarm on and sitter in room.   Therapy Documentation Precautions:  Precautions Precautions: Fall Precaution Comments: body tremors during functional mobility  Restrictions Weight Bearing Restrictions: No  See FIM for current functional status  Therapy/Group: Individual Therapy  [name] 06/26/2013, 5:08 PM

## 2013-06-26 NOTE — Progress Notes (Signed)
Per Wound Care Nurse, leave the dressings off patient's bottom/scrotal area, coat liberally with zinc barrier cream and try to keep perianal area clean and dry.

## 2013-06-26 NOTE — Progress Notes (Signed)
Speech Language Pathology Daily Session Note  Patient Details  Name: Shaun Lutz MRN: [id number] Date of Birth: 05/14/1955  Today's Date: 06/26/2013 Time: 7026-3785 Time Calculation (min): 45 min  Short Term Goals: Week 1: SLP Short Term Goal 1 (Week 1): STGs = LTGs due to anticipated brief length of stay  Skilled Therapeutic Interventions: Skilled treatment session focused on cognitive goals. SLP facilitated session by providing Mod A visual and question cues to increase recall of new, daily information with use of external memory aids. Pt also educated on need to write new, daily information down to increase carryover such as events from therapy sessions. Pt verbalized understanding. Pt also verbalized appropriate emergent awareness into physical deficits and reported he "starts to shake" and needs assistance when up on his feet.  Pt also demonstrated appropriate emergent awareness into decreased working memory. Pt was able to alternate attention between conversation and meal with supervision verbal cues. Continue with current plan of care.    FIM:  Comprehension Comprehension Mode: Auditory Comprehension: 4-Understands basic 75 - 89% of the time/requires cueing 10 - 24% of the time Expression Expression Mode: Verbal Expression: 5-Expresses basic needs/ideas: With no assist Social Interaction Social Interaction: 5-Interacts appropriately 90% of the time - Needs monitoring or encouragement for participation or interaction. Problem Solving Problem Solving: 3-Solves basic 50 - 74% of the time/requires cueing 25 - 49% of the time Memory Memory: 3-Recognizes or recalls 50 - 74% of the time/requires cueing 25 - 49% of the time  Pain Pain Assessment Pain Assessment: No/denies pain Pain Score: 3   Therapy/Group: Individual Therapy  Buzzy Han 06/26/2013, 11:57 AM

## 2013-06-26 NOTE — Consult Note (Signed)
WOC wound consult note Reason for Consult: evaluation of buttock wounds. Pt with large diffuse area of full thickness patchy skin loss over the buttocks.  Unclear the etiology, he is HIV positive and was thought to have this rash related to PCN.  He is not incontinent of B/B so this is not related to moisture, however now that this large area is draining so much it certainly may be worsening due to exposure to wound drainage.  It will be difficult to secure dressings on these wounds due to the location.  I have applied thick layer of zinc based barrier to provide protection however may need to consider use of some type of topical absorbant product like alginate under foam to attempt to manage the exudate a bit more.  Again may be very difficult to keep any type of dressing like that in place due to location.  Pt is wearing brief, even though he is intermittantly catheterized and is not incontinent of bowel.  Mostly using the brief to control the drainage from the buttocks and scrotum from this rash.  IWound type: rash now with full thickness skin loss, over both buttocks and scrotum. Wound bed: full thickness skin patches with yellow base and some intact skin between the lesions.  Drainage (amount, consistency, odor) moderate, serous  Periwound: pt appears to have history of skin lesions, he has scarring over his face and neck and vitiligo over his body.    Dressing procedure/placement/frequency: will try zinc based barrier for a few days and reevaluate later this week if needed may need to try to add absorptive dressing with topper.   I think it would be a good idea to have dermatology consult on this fellow if possible while inpatient to see if use of some type of steroid lotion or cream may help with this rash.  Unclear if this is medication reaction or other infectious process related to immunocompromised status.    Discussed POC with patient and bedside nurse.  Re consult if needed, will not follow  at this time. Thanks  Meliton Samad Kellogg, [place] [phone])

## 2013-06-27 ENCOUNTER — Inpatient Hospital Stay (HOSPITAL_COMMUNITY): Payer: Medicare Other

## 2013-06-27 ENCOUNTER — Inpatient Hospital Stay (HOSPITAL_COMMUNITY): Payer: Self-pay

## 2013-06-27 ENCOUNTER — Inpatient Hospital Stay (HOSPITAL_COMMUNITY): Payer: Medicare Other | Admitting: Speech Pathology

## 2013-06-27 ENCOUNTER — Inpatient Hospital Stay (HOSPITAL_COMMUNITY): Payer: Self-pay | Admitting: Occupational Therapy

## 2013-06-27 DIAGNOSIS — A523 Neurosyphilis, unspecified: Secondary | ICD-10-CM | POA: Diagnosis not present

## 2013-06-27 DIAGNOSIS — R5381 Other malaise: Secondary | ICD-10-CM

## 2013-06-27 LAB — URINE CULTURE
Colony Count: NO GROWTH
Culture: NO GROWTH

## 2013-06-27 LAB — GLUCOSE, CAPILLARY
GLUCOSE-CAPILLARY: 108 mg/dL — AB (ref 70–99)
Glucose-Capillary: 137 mg/dL — ABNORMAL HIGH (ref 70–99)
Glucose-Capillary: 220 mg/dL — ABNORMAL HIGH (ref 70–99)
Glucose-Capillary: 156 mg/dL — ABNORMAL HIGH (ref 70–99)

## 2013-06-27 MED ORDER — BETHANECHOL CHLORIDE 25 MG PO TABS
25.0000 mg | ORAL_TABLET | Freq: Three times a day (TID) | ORAL | Status: DC
  Administered 2013-06-27 – 2013-06-28 (×3): 25 mg via ORAL
  Filled 2013-06-27 (×5): qty 1

## 2013-06-27 NOTE — Progress Notes (Signed)
Speech Language Pathology Daily Session Note  Patient Details  Name: Shaun Lutz MRN: [id number] Date of Birth: 01-15-1956  Today's Date: 06/27/2013 Time: 0800-0827 Time Calculation (min): 27 min  Short Term Goals: Week 1: SLP Short Term Goal 1 (Week 1): STGs = LTGs due to anticipated brief length of stay  Skilled Therapeutic Interventions: Skilled treatment session focused on cognitive goals. SLP facilitated session by providing patient with a notebook to increase utilization of writing information down to increase recall and carryover of new information.  Pt recalled events from morning therapy session and utilized notebook to write information down with Mod I. Continue with current plan of care.    FIM:  Comprehension Comprehension Mode: Auditory Comprehension: 5-Understands basic 90% of the time/requires cueing < 10% of the time Expression Expression Mode: Verbal Expression: 5-Expresses basic 90% of the time/requires cueing < 10% of the time. Social Interaction Social Interaction: 5-Interacts appropriately 90% of the time - Needs monitoring or encouragement for participation or interaction. Problem Solving Problem Solving: 4-Solves basic 75 - 89% of the time/requires cueing 10 - 24% of the time Memory Memory: 4-Recognizes or recalls 75 - 89% of the time/requires cueing 10 - 24% of the time FIM - Eating Eating Activity: 5: Supervision/cues  Pain Pain Assessment Pain Assessment: No/denies pain   Therapy/Group: Individual Therapy  Buzzy Han 06/27/2013, 11:47 AM

## 2013-06-27 NOTE — Progress Notes (Signed)
Occupational Therapy Session Note  Patient Details  Name: Shaun Lutz MRN: [id number] Date of Birth: 1955-11-19  Today's Date: 06/27/2013 Time: 2836-6294 Time Calculation (min): 31 min   Skilled Therapeutic Interventions/Progress Updates:    Pt sat up EOB with close supervision and worked on donning his slippers with min assist.  He still demonstrates decreased ability to cross his LLE over the right knee so requires assist to perform this and maintain it in that position.  Pt ambulated down to the gym with min guard assist and min instruction cueing to maintain trunk extension.  Once in the gym worked on standing balance while reaching down to pick up clothespins and then place on vertical pole. He worked on alternating UEs while also having 2lb wrist weights on each arm.  Mr. Mollenkopf needed 3 rest breaks during intervals of 1-3 mins.  Noted pt with 2-3 episodes of severe tremors in both sitting and standing in the trunk and RUE.  At times he was unable to maintain standing as the tremors were so severe.  Ambulated back to room at end of session and returned to bed with min assist.    Therapy Documentation Precautions:  Precautions Precautions: Fall Precaution Comments: body tremors during functional mobility  Restrictions Weight Bearing Restrictions: No  Pain: Pain Assessment Pain Assessment: No/denies pain ADL: ADL ADL Comments: see FIM  See FIM for current functional status  Therapy/Group: Individual Therapy  Cindra Presume OTR/L 06/27/2013, 4:12 PM

## 2013-06-27 NOTE — Progress Notes (Signed)
[facility] PHYSICAL MEDICINE & REHABILITATION     PROGRESS NOTE    Subjective/Complaints: Shaun Lutz was about to be dc'ed when pt stated "he wanted to die"---no such expressions today A 12 point review of systems has been performed and if not noted above is otherwise negative.   Objective: Vital Signs: Blood pressure 128/78, pulse 90, temperature 99.6 F (37.6 C), temperature source Oral, resp. rate 18, height 5\' 8"  (1.727 m), weight 58.106 kg (128 lb 1.6 oz), SpO2 95.00%. No results found. No results found for this basename: WBC, HGB, HCT, PLT,  in the last 72 hours No results found for this basename: NA, K, CL, CO, GLUCOSE, BUN, CREATININE, CALCIUM,  in the last 72 hours CBG (last 3)   Recent Labs  06/26/13 1621 06/26/13 2103 06/27/13 0712  GLUCAP 202* 154* 108*    Wt Readings from Last 3 Encounters:  06/22/13 58.106 kg (128 lb 1.6 oz)  06/18/13 54.3 kg (119 lb 11.4 oz)  06/07/13 52.481 kg (115 lb 11.2 oz)    Physical Exam:  Constitutional:  58 year old male appearing older than stated age.  HENT:  Poor dentition/ oral mucosa pink/moist  Eyes: EOM are normal.  Neck: Normal range of motion. Neck supple. No thyromegaly present.  Cardiovascular: Normal rate and regular rhythm. No murmur  Respiratory: Effort normal and breath sounds normal. No respiratory distress. No wheezes  GI: Soft. Bowel sounds are normal. He exhibits no distension.  Neurological: He is alert.  Patient follows simple commands. He was able to provide his name and place.  Doesn't remember any of yesterdays activities   Limited insight and awareness.  intention tremor bilateral lower extremities greater than bilateral upper extremities  Motor strength is 4+/5 bilateral deltoid, bicep, tricep, grip bilaterally. LE's 3+ prox HF, 4- KE and 4/5 at ankles.  Skin: vitiligo diffusely, diffuse maculopapular drug rash which is resolving--some exfoliation Psych: calm, non-distressed     Assessment/Plan: 1.  Functional deficits secondary to neurosyphilis which require 3+ hours per day of interdisciplinary therapy in a comprehensive inpatient rehab setting. Physiatrist is providing close team supervision and 24 hour management of active medical problems listed below. Physiatrist and rehab team continue to assess barriers to discharge/monitor patient progress toward functional and medical goals. FIM: FIM - Bathing Bathing Steps Patient Completed: Chest;Right Arm;Left Arm;Abdomen;Front perineal area;Buttocks;Right upper leg;Right lower leg (including foot);Left upper leg;Left lower leg (including foot) Bathing: 4: Steadying assist  FIM - Upper Body Dressing/Undressing Upper body dressing/undressing steps patient completed: Thread/unthread right sleeve of pullover shirt/dresss;Thread/unthread left sleeve of pullover shirt/dress;Put head through opening of pull over shirt/dress;Pull shirt over trunk Upper body dressing/undressing: 5: Set-up assist to: Obtain clothing/put away FIM - Lower Body Dressing/Undressing Lower body dressing/undressing steps patient completed: Thread/unthread right pants leg;Pull pants up/down;Fasten/unfasten pants;Don/Doff left sock;Don/Doff left shoe;Don/Doff right sock;Don/Doff right shoe Lower body dressing/undressing: 4: Min-Patient completed 75 plus % of tasks  FIM - Toileting Toileting steps completed by patient: Adjust clothing prior to toileting Toileting: 2: Max-Patient completed 1 of 3 steps (per Caro Hight, NT)  FIM - Toilet Transfers Toilet Transfers Assistive Devices: Grab bars Toilet Transfers: 0-Activity did not occur  FIM - Control and instrumentation engineer Devices: Arm rests Bed/Chair Transfer: 5: Supine > Sit: Supervision (verbal cues/safety issues);4: Bed > Chair or W/C: Min A (steadying Pt. > 75%)  FIM - Locomotion: Wheelchair Distance: 175' Locomotion: Wheelchair: 5: Travels 150 ft or more: maneuvers on rugs and over door sills with  supervision, cueing or coaxing FIM -  Locomotion: Ambulation Locomotion: Ambulation Assistive Devices: Administrator Ambulation/Gait Assistance: 4: Min guard;5: Supervision Locomotion: Ambulation: 4: Travels 150 ft or more with minimal assistance (Pt.>75%)  Comprehension Comprehension Mode: Auditory Comprehension: 5-Understands basic 90% of the time/requires cueing < 10% of the time  Expression Expression Mode: Verbal Expression: 5-Expresses basic 90% of the time/requires cueing < 10% of the time.  Social Interaction Social Interaction: 5-Interacts appropriately 90% of the time - Needs monitoring or encouragement for participation or interaction.  Problem Solving Problem Solving: 4-Solves basic 75 - 89% of the time/requires cueing 10 - 24% of the time  Memory Memory: 4-Recognizes or recalls 75 - 89% of the time/requires cueing 10 - 24% of the time  Medical Problem List and Plan:  1. Neurosyphilis: s/p PCN 2. DVT Prophylaxis/Anticoagulation: Subcutaneous heparin.    3. Pain Management: Tylenol as needed  4. Mood/depression: Lexapro 5 mg daily. Followup per psychiatry services   -mood has been pleasant and up beat thus far over the first two days of rehab  -don't how much credibility can be given to his suicidal ideations given his cognitive status  -intermittently states "he wants to die"---will review with team today. Don't believe he needs to be on a suicide watch 5. Neuropsych: This patient is not capable of making decisions on his own behalf.  6. Urticarial drug rash-felt to be secondary penicillin. Rash improving prednisone taper. Contact precautions for open lesions.   7. COPD. Check oxygen saturations every shift  8. Heart block status post pacemaker. Chest pain or shortness of breath  9. Hypertension. Tenormin 25 mg daily. Monitor with increased mobility 10. 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 Urine retention: UA neg, ucx pending  -flomax and urecholine trial  -I/O cath prn. Still retaining  LOS (Days) 6 A FACE TO FACE EVALUATION WAS PERFORMED  [name] 06/27/2013 8:14 AM

## 2013-06-27 NOTE — Progress Notes (Deleted)
PMR Admission Coordinator Pre-Admission Assessment  Patient: Shaun Lutz is an 58 y.o., male MRN: [id number] DOB: 06/15/1955 Height: 5\' 8"  (172.7 cm) Weight: 58.106 kg (128 lb 1.6 oz)              Insurance Information HMO:     PPO:      PCP:      IPA:      80/20: yes     OTHER: no HMO PRIMARY: Medicare a and b      Policy#: [id number]      Subscriber: pt Benefits:  Phone #: palmetto online     Name: 06/19/13  Eff. Date: 11/30/2012     Deduct: $1260      Out of Pocket Max: none      Life Max: none CIR: 100%      SNF: 20 full days Outpatient: 80%     Co-Pay: 20% Home Health: 100%      Co-Pay: none DME: 80%     Co-Pay: 20% Providers: pt choice  SECONDARY: none        Medicaid Application Date: tba      Case Manager:  Pt gets his meds from [facility]. Inez Catalina is unaware of any Medicaid  Emergency Contact Information Contact Information   Name Relation Home Work Mobile   Donnell,Betty Significant other [phone]       Current Medical History  Patient Admitting Diagnosis: Neurosyphilis, newly diagnosed HIV  History of Present Illness:Shaun Lutz is a 58 y.o. right handed male history of hypertension, COPD, heart block status post pacemaker. Patient lives with his girlfriend.   Admitted 06/07/2013 with progressive gait disorder as well as hand tremors with recent fall. Patient tested positive HIV as well as syphilis with RPR of 1:512. Lumbar puncture showed white blood cell count of 86 with 60% segmented neutrophils 35% lymphocytes. CSF protein greater than 600 and glucose of 7. Gram stain negative for any organisms and cryptococcal antigen on CSF was negative. 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 Infectious disease consulted plan treatment of 14 days of high-dose penicillin and 3 doses of weekly PCN. Started on ARV with workup ongoing. Subcutaneous heparin for DVT prophylaxis.   ON  4/20 psychiatry agreed that pt has passive thought of suicide and does not currently require a sitter and it is to be discontinued.  Dr. Tommy Medal, Infectious Disease, reconsulted about antibiotic plan since pt developed a rash, concerning for penicillin sensitivity. Antibiotics switched to ceftriaxone and antiviral, Stribild, discontinued. New antiviral to be started. HIV genomic studies in process. HIV RNA quant approx 60,000; CD4 250. On 4/20 pt on IV solumedrol q 6hrs.  Past Medical History  Past Medical History  Diagnosis Date  . Hypertension   . Second degree AV block   . Syncope   . Pacemaker -Medtronic     DOI 2012    Family History  family history includes Cancer in his mother; Heart disease in his brother, father, and sister.  Prior Rehab/Hospitalizations: none  Current Medications  Current facility-administered medications:acetaminophen (TYLENOL) tablet 325-650 mg, 325-650 mg, Oral, Q4H PRN, [name], PA-C, 650 mg at 06/25/13 2029;  aspirin chewable tablet 81 mg, 81 mg, Oral, Daily, [name], PA-C, 81 mg at 06/27/13 0747;  atenolol (TENORMIN) tablet 25 mg, 25 mg, Oral, Daily, [name], PA-C, 25 mg at 06/27/13 0747;  bethanechol (URECHOLINE) tablet 25 mg, 25 mg,  Oral, TID, [name], MD dolutegravir Humphrey Rolls) tablet 50 mg, 50 mg, Oral, Daily, [name], PA-C, 50 mg at 06/27/13 0747;  emtricitabine-tenofovir (TRUVADA) 200-300 MG per tablet 1 tablet, 1 tablet, Oral, Daily, [name], PA-C, 1 tablet at 06/27/13 0747;  escitalopram (LEXAPRO) tablet 5 mg, 5 mg, Oral, QHS, [name], PA-C, 5 mg at 06/26/13 2040 feeding supplement (ENSURE COMPLETE) (ENSURE COMPLETE) liquid 237 mL, 237 mL, Oral, TID, [name], PA-C, 237 mL at 06/27/13 1433;  heparin injection 5,000 Units, 5,000 Units, Subcutaneous, 3 times per day, [name], PA-C, 5,000 Units at 06/27/13 1432;  hydrOXYzine (ATARAX/VISTARIL) tablet 10 mg, 10 mg, Oral,  TID PRN, [name], PA-C, 10 mg at 06/25/13 2029 insulin aspart (novoLOG) injection 0-5 Units, 0-5 Units, Subcutaneous, QHS, [name], PA-C, 2 Units at 06/22/13 2225;  ondansetron (ZOFRAN) injection 4 mg, 4 mg, Intravenous, Q6H PRN, [name], PA-C;  ondansetron (ZOFRAN) tablet 4 mg, 4 mg, Oral, Q6H PRN, [name], PA-C;  predniSONE (DELTASONE) tablet 20 mg, 20 mg, Oral, Q breakfast, [name], PA-C, 20 mg at 06/27/13 0747 sorbitol 70 % solution 30 mL, 30 mL, Oral, Daily PRN, [name], PA-C, 30 mL at 06/26/13 1728;  tamsulosin (FLOMAX) capsule 0.4 mg, 0.4 mg, Oral, QPC supper, [name], MD, 0.4 mg at 06/26/13 1728  Patients Current Diet: Regular diet  Precautions / Restrictions Precautions Precautions: Fall Precaution Comments: body tremors during functional mobility  Restrictions Weight Bearing Restrictions: No   Prior Activity Level   Inez Catalina states pt has become progressively weaker over last several few months. Tremors with up activities. Slow gait. Does not use AD. Has not fallen.Disabled due to heart issues. Did his own bathing and dressing.  Did not drive. [place] / Equipment    Prior Functional Level Prior Function Comments: Pt req occasional falls at home PTA  Current Functional Level Cognition  Arousal/Alertness: Awake/alert Overall Cognitive Status: Impaired/Different from baseline Orientation Level: Oriented X4 Attention: Sustained Sustained Attention: Appears intact Selective Attention: Impaired Selective Attention Impairment: Functional basic Memory: Impaired Memory Impairment: Storage deficit;Decreased recall of new information;Decreased short term memory Decreased Short Term Memory: Functional basic;Verbal basic Awareness: Impaired Awareness Impairment: Intellectual impairment Problem Solving: Impaired (suspect impacted by decreased working memory) Problem Solving  Impairment: Investment banker, corporate Function: Writer: Impaired Organizing Impairment: Functional basic;Verbal complex Safety/Judgment: Impaired (pt verbalizes need to call for help prior to getting up, however question pt's ability to recall throughout the day) Comments: Decreased speed of processing    Extremity Assessment (includes Sensation/Coordination)          ADLs       Mobility       Transfers       Ambulation / Gait / Stairs / Wheelchair Mobility  Ambulation/Gait Ambulation Distance (Feet): 100 Feet Gait velocity: Slow Number of Stairs: 10 Wheelchair Mobility Distance: 175'    Posture / Balance Static Sitting Balance Static Sitting - Balance Support: Left upper extremity supported;Right upper extremity supported;Feet supported;Bilateral upper extremity supported Static Sitting - Level of Assistance: 5: Stand by assistance Dynamic Sitting Balance Dynamic Sitting - Balance Support: Right upper extremity supported;Left upper extremity supported;Bilateral upper extremity supported;Feet supported Dynamic Sitting - Level of Assistance: 4: Min assist;5: Stand by assistance Dynamic Sitting - Balance Activities: Forward lean/weight shifting;Lateral lean/weight shifting;Reaching for objects Static Standing Balance Static Standing - Balance Support: Bilateral upper extremity supported Static  Standing - Level of Assistance: 4: Min assist Dynamic Standing Balance Dynamic Standing - Balance Support: Bilateral upper extremity supported;Left upper extremity supported;Right upper extremity supported;During functional activity Dynamic Standing - Level of Assistance: 4: Min assist Dynamic Standing - Balance Activities: Lateral lean/weight shifting;Forward lean/weight shifting;Reaching for objects    Special needs/care consideration Skin Skin with hyperpigmented lesions. Rash appeared last week with extensive areas of excoriation. Placed on IV steroids, now po 4/22.  Exfoliation on face and back. Bowel mgmt: incontinent Bladder mgmt:continent Diabetic mgmt no   Previous Home Environment Living Arrangements: Spouse/significant other;Non-relatives/Friends  Lives With: Significant other Available Help at Discharge: Other (Comment) (significant other) Type of Home: House Home Layout: One level Home Access: Stairs to enter Entrance Stairs-Rails: Right Entrance Stairs-Number of Steps: 5 Bathroom Shower/Tub: Chiropodist: Standard Bathroom Accessibility: No Additional Comments: not sure  Discharge Living Setting Does the patient have any problems obtaining your medications?: No Inez Catalina states she always takes care of family who are unable to help themselves and that Rohail has also always taken care of family members.  Social/Family/Support Systems Patient Roles: Partner;Other (Comment) (estranged from his adult children) Anticipated Caregiver: Inez Catalina Ability/Limitations of Caregiver: can provide min assist level Caregiver Availability: 24/7 Inez Catalina, designated POA but has not have papers.  Pt has been told about his diagnosis 06/07/13 by [facility] as well as girlfriend. Dr. Tommy Medal has also discussed with pt at bedside, but pt does not remember dx from one discussion to the next with same MD.(day to day) Sister is unaware of dx.  Goals/Additional Needs Expected length of stay: ELOS 7-10 days   Decrease burden of Care through IP rehab admission: n/a  Possible need for SNF placement upon discharge:I discussed with Inez Catalina, girlfriend, and she does not want SNF.Marland Kitchen She states she and pt have always assisted other family members during illnesses and she plans to do the same for him. SHe will let helathcare providers know if she gets to a point that she can not manage.  Patient Condition: This patient's medical and functional status has changed since the consult dated: 06/15/13 in which the Rehabilitation Physician determined and  documented that the patient's condition is appropriate for intensive rehabilitative care in an inpatient rehabilitation facility. See "History of Present Illness" (above) for medical update. Functional changes are: overall mod assist. Patient's medical and functional status update has been discussed with the Rehabilitation physician and patient remains appropriate for inpatient rehabilitation. Will admit to inpatient rehab today.  Preadmission Screen Completed By:  Cleatrice Burke, 06/27/2013 3:16 PM ______________________________________________________________________   Discussed status with Dr. Naaman Plummer on 06/21/13 at  1345 and received telephone approval for admission today.  Admission Coordinator:  Cleatrice Burke, time 6644 Date 06/21/13.

## 2013-06-27 NOTE — Progress Notes (Signed)
Occupational Therapy Session Note  Patient Details  Name: Shaun Lutz MRN: [id number] Date of Birth: 1956-01-16  Today's Date: 06/27/2013 Time: 0700-0756 Time Calculation (min): 56 min  Short Term Goals: Week 1:  OT Short Term Goal 1 (Week 1): STG=LTG due to anticipated briefl length of stay  Skilled Therapeutic Interventions/Progress Updates:    Pt engaged in bathing at shower level and dressing with sit<>stand from EOB.  Pt required assistance threading pants this morning but was able to don shocks and shoes without assistance. Pt fatigues quickly and requires multiple rest breaks throughout session.  Focus on activity tolerance, safety awareness, dynamic standing balance, and functional ambulation with RW for home mgmt tasks.  Therapy Documentation Precautions:  Precautions Precautions: Fall Precaution Comments: body tremors during functional mobility  Restrictions Weight Bearing Restrictions: No Pain: Pain Assessment Pain Assessment: Faces Faces Pain Scale: Hurts a little bit Pain Location: Buttocks Pain Orientation: Medial Pain Descriptors / Indicators: Nagging;Tender Pain Onset: On-going Pain Intervention(s): RN made aware;Repositioned ADL: ADL ADL Comments: see FIM  See FIM for current functional status  Therapy/Group: Individual Therapy  Shaun Lutz 06/27/2013, 7:57 AM

## 2013-06-27 NOTE — Progress Notes (Addendum)
Physical Therapy Session Note  Patient Details  Name: Shaun Lutz MRN: [id number] Date of Birth: Apr 15, 1955  Today's Date: 06/27/2013 Time: Treatment Session 1: 2423-5361; Treatment Session 2: 1335-1400 Time Calculation (min): Treatment Session 1: 50 min; Treatment Session 2: 80min  Short Term Goals: Week 1:  PT Short Term Goal 1 (Week 1): STGs=LTGs due to anticipated LOS  Skilled Therapeutic Interventions/Progress Updates:  Treatment Session 1:  1:1. Pt received sitting in w/c, ready for therapy. Focus this session on ambulation, stair negotiation, car transfer training, pressure relief and memory. Pt with excellent tolerance to amb 300'x1 and 150'x1 w/ RW and close w/c follow on hard level and carpeted surfaces. Emphasis on emergent awareness of fatigue, specifically onset of gross tremors=need to sit. Pt able to complete safe car transfer w/ supervision, req increased time due to pain on bottom. Pt practiced negotiation up/down 8 steps w/ single rail and min cueing for step-to pattern with supervision. Strong emphasis this session on pressure relief with w/c pushups and B leans. Pt provided handout as well as timer to perform pressure reliefs every 60min when sitting in chair. Max cueing for memory to review therapeutic tasks this session. Pt in L sidelying at end of session, with all needs in reach and sitter in room.   Treatment Session 2:  1:1. Pt received supine in bed, ready for therapy. Focus this session on functional endurance and standing balance. Pt w/ good tolerance to amb room>therapy gym w/ RW and overall (S), however, recommended use of w/c to return to room at end of session due to fatigue (mod I). Pt engaged in bean bag toss standing on complaint surface w/out UE support to challenge balance, req pt to reach in/outside BOS in all quadrants w/ close(S)-min A. Pt further challenged to pick up bean bags, however, pt experienced onset of tremors in B LE and subsequently starting to give  out req max A to achieve standing. Second person required to position chair to sit safely. Pt supine in bed at end of session w/ all needs in reach, bed alarm on and sitter in room.  Therapy Documentation Precautions:  Precautions Precautions: Fall Precaution Comments: body tremors during functional mobility  Restrictions Weight Bearing Restrictions: No  See FIM for current functional status  Therapy/Group: Individual Therapy  [name] 06/27/2013, 9:56 AM

## 2013-06-28 ENCOUNTER — Inpatient Hospital Stay (HOSPITAL_COMMUNITY): Payer: Medicare Other | Admitting: *Deleted

## 2013-06-28 ENCOUNTER — Inpatient Hospital Stay (HOSPITAL_COMMUNITY): Payer: Self-pay

## 2013-06-28 ENCOUNTER — Encounter (HOSPITAL_COMMUNITY): Payer: Medicare Other

## 2013-06-28 ENCOUNTER — Inpatient Hospital Stay (HOSPITAL_COMMUNITY): Payer: Medicare Other | Admitting: Speech Pathology

## 2013-06-28 DIAGNOSIS — R5381 Other malaise: Secondary | ICD-10-CM | POA: Diagnosis not present

## 2013-06-28 DIAGNOSIS — A5211 Tabes dorsalis: Secondary | ICD-10-CM | POA: Diagnosis not present

## 2013-06-28 LAB — GLUCOSE, CAPILLARY
GLUCOSE-CAPILLARY: 150 mg/dL — AB (ref 70–99)
GLUCOSE-CAPILLARY: 121 mg/dL — AB (ref 70–99)
Glucose-Capillary: 215 mg/dL — ABNORMAL HIGH (ref 70–99)
Glucose-Capillary: 190 mg/dL — ABNORMAL HIGH (ref 70–99)

## 2013-06-28 MED ORDER — GERHARDT'S BUTT CREAM
TOPICAL_CREAM | Freq: Two times a day (BID) | CUTANEOUS | Status: DC
  Administered 2013-06-28 – 2013-06-30 (×5): via TOPICAL
  Filled 2013-06-28: qty 1

## 2013-06-28 MED ORDER — PREDNISONE 10 MG PO TABS
10.0000 mg | ORAL_TABLET | Freq: Every day | ORAL | Status: DC
  Administered 2013-06-29 – 2013-06-30 (×2): 10 mg via ORAL
  Filled 2013-06-28 (×3): qty 1

## 2013-06-28 MED ORDER — ALPRAZOLAM 0.25 MG PO TABS
0.2500 mg | ORAL_TABLET | Freq: Three times a day (TID) | ORAL | Status: DC | PRN
  Administered 2013-06-28 – 2013-06-29 (×2): 0.25 mg via ORAL
  Filled 2013-06-28 (×2): qty 1

## 2013-06-28 NOTE — Patient Care Conference (Signed)
Inpatient RehabilitationTeam Conference and Plan of Care Update Date: 06/27/2013   Time: 3:05 PM    Patient Name: Shaun Lutz      Medical Record Number: [id number]  Date of Birth: May 09, 1955 Sex: Male         Room/Bed: 4W20C/4W20C-01 Payor Info: Payor: MEDICARE / Plan: MEDICARE PART A AND B / Product Type: *No Product type* /    Admitting Diagnosis: neurosyphyllis  Admit Date/Time:  06/21/2013  4:15 PM Admission Comments: No comment available   Primary Diagnosis:  <principal problem not specified> Principal Problem: <principal problem not specified>  Patient Active Problem List   Diagnosis Date Noted  . HIV (human immunodeficiency virus infection) 06/21/2013  . Malnutrition of moderate degree 06/08/2013  . Human immunodeficiency virus (HIV) disease 06/07/2013  . Syphilis in male 06/07/2013  . Lower extremity weakness 06/07/2013  . Syphilis 06/07/2013  . Abnormality of gait 05/22/2013  . Decreased hearing of both ears 11/23/2012  . Hypotension 08/22/2012  . Physical deconditioning 08/10/2012  . Underweight 08/10/2012  . Occasional tremors 07/08/2012  . At [place] for Falls 07/08/2012  . Pacemaker- medtronic 05/13/2012  . SYNCOPE 04/07/2010  . OTHER&UNSPECIFIED ALCOHOL DEPENDENCE REMISSION 03/05/2010  . COPD 03/05/2010  . VITILIGO 03/05/2010  . ATRIOVENTRICULAR BLOCK, MOBITZ TYPE II 03/05/2010    Expected Discharge Date: Expected Discharge Date: 06/30/13  Team Members Present: Physician leading conference: Dr. Alroy Dust Swartz;Dr. Alysia Penna Social Worker Present: Lennart Pall, LCSW Nurse Present: Elliot Cousin, RN PT Present: Bridgett Ripa, Cottie Banda, PT OT Present: Gareth Morgan, OT;Roanna Epley, COTA SLP Present: Gunnar Fusi, SLP PPS Coordinator present : Ileana Ladd, Lelan Pons, RN, [facility]     Current Status/Progress Goal Weekly Team Focus  Medical   neurosyphilis. suicide precautions. poor awareness and memory  improve activity tolerance  manage  ID issues, ?f/u TB testing.   Bowel/Bladder   Urine retention, requires cath q6h, continent of bowel.   Continent of bowel and bladder with med assistance.  Explorer reasons for urinary retention and discuss alternative methods.   Swallow/Nutrition/ Hydration             ADL's   LB bathing and dressing-min A; transfers-steady A/supervision; limited activity tolerance  supervision overall  activity tolerance, BADLs, family education, balance   Mobility   Close supervision-occasional min A  Supervision overall  functional endurance, safety during standing mobility, family education, d/c planning   Communication             Safety/Cognition/ Behavioral Observations  Mod-max A  Min A  working memory with use of strategies   Pain   No complaints of pain  n/a  n/a   Skin   Buttocks scratched open, skin dry and flaky, scrotum is open and draining, as per wound nurse apply with zinc on buttocks and scrotum.  Continue skin integerity, no new skin break down, stay free from infection.  Continue to montior skin.    Rehab Goals Patient on target to meet rehab goals: Yes *See Care Plan and progress notes for long and short-term goals.  Barriers to Discharge: poor cogniition, safety    Possible Resolutions to Barriers:  full time supervision    Discharge Planning/Teaching Needs:  home wtih girlfriend, Inez Catalina, providing 24/7 assistance      Team Discussion:  Reaching supervision goals overall (occ min assist as he can lose balance at times).  Limited awareness of his fatigue points.  Still requiring caths so may need foley for home.  Planning education with  gf on Thursday.  Revisions to Treatment Plan:  D/c sitter - MD feels not a threat to himself.   Continued Need for Acute Rehabilitation Level of Care: The patient requires daily medical management by a physician with specialized training in physical medicine and rehabilitation for the following conditions: Daily direction of a  multidisciplinary physical rehabilitation program to ensure safe treatment while eliciting the highest outcome that is of practical value to the patient.: Yes Daily medical management of patient stability for increased activity during participation in an intensive rehabilitation regime.: Yes Daily analysis of laboratory values and/or radiology reports with any subsequent need for medication adjustment of medical intervention for : Neurological problems;Other  Lennart Pall 06/28/2013, 10:39 AM

## 2013-06-28 NOTE — Discharge Instructions (Signed)
Inpatient Rehab Discharge Instructions  Andric Kerce Discharge date and time: No discharge date for patient encounter.   Activities/Precautions/ Functional Status: Activity: activity as tolerated Diet: Soft Wound Care: keep wound clean and dry Functional status:  ___ No restrictions     ___ Walk up steps independently ___ 24/7 supervision/assistance   ___ Walk up steps with assistance ___ Intermittent supervision/assistance  ___ Bathe/dress independently ___ Walk with walker     ___ Bathe/dress with assistance ___ Walk Independently    ___ Shower independently _x__ Walk with assistance    ___ Shower with assistance ___ No alcohol     ___ Return to work/school ________    COMMUNITY REFERRALS UPON DISCHARGE:    Home Health:   PT     OT     ST    RN    CNA    SW                    Agency: [facility] Phone: [phone]   Medical Equipment/Items Ordered: wheelchair, cushion, rolling walker, commode and tub bench                                                     Agency/Supplier: [facility] RESOURCES FOR PATIENT/FAMILY:  Support Groups: [place] @ [phone]   Caregiver Support: same        Special Instructions:    My questions have been answered and I understand these instructions. I will adhere to these goals and the provided educational materials after my discharge from the hospital.  Patient/Caregiver Signature _______________________________ Date __________  Clinician Signature _______________________________________ Date __________  Please bring this form and your medication list with you to all your follow-up doctor's appointments.

## 2013-06-28 NOTE — Progress Notes (Signed)
Physical Therapy Session Note  Patient Details  Name: Shaun Lutz MRN: [id number] Date of Birth: 05/17/1955  Today's Date: 06/28/2013 Time: 0700-0735 Time Calculation (min): 35 min  Short Term Goals: Week 1:  PT Short Term Goal 1 (Week 1): STGs=LTGs due to anticipated LOS  Skilled Therapeutic Interventions/Progress Updates:  1:1. Pt received in R sidelying, ready for therapy. Pt w/ mild complaints of pain in bottom during tx session, primarily during transitional movements. Changed pt's w/c cushion changed to roho for improved pressure relief on bottom, to prevent further skin breakdown and improved comfort in sitting. Focus majority of session on functional endurance, d/c planning and standing balance. Pt w/ excellent tolerance to w/c propulsion x250' at [facility]) level. Pt able to safely demonstrate negotiation up/down 8 steps w/ single rail using step-to pattern w/out cues and no onset of tremors. Pt req overall supervision for amb 100'x1 and 175'x1 w/ RW, however, min cueing for emergent weaknesses regarding need to sit when fatigued. Pt performed seated weighted ball toss sitting on compliant surface w/out B LE support to challenge sitting balance and engage core. Transition to standing ball toss/bounce req very close(S), demonstrating 1 posterior LOB sitting on mat table behind him. Pt emotional this session talking about family/personal life, emotional support provided. Pt supine in bed at end of session w/ all needs in reach, bed alarm on.   Therapy Documentation Precautions:  Precautions Precautions: Fall Precaution Comments: body tremors during functional mobility  Restrictions Weight Bearing Restrictions: No  See FIM for current functional status  Therapy/Group: Individual Therapy  [name] 06/28/2013, 9:28 AM

## 2013-06-28 NOTE — Progress Notes (Signed)
Physical Therapy Session Note  Patient Details  Name: Shaun Lutz MRN: [id number] Date of Birth: March 13, 1955  Today's Date: 06/28/2013 Time: 6387-5643 Time Calculation (min): 45 min  Short Term Goals: Week 1:  PT Short Term Goal 1 (Week 1): STGs=LTGs due to anticipated LOS  Skilled Therapeutic Interventions/Progress Updates:    Patient received supine in bed. Session focused on cognitive remediation and functional mobility. Wheelchair mobility [phone] with B UE and mod I. Gait training in controlled environment x174' with RW and close supervision, verbal cues for attention to task and negotiation of obstacles. Stair negotiation x5 stairs with one handrail to simulate STE home, requires minA through 4 stairs, then requires total assist and return to sitting in wheelchair secondary to body tremors and B knee buckling. Standing ball toss while naming foods corresponding to letters on ball, requires minA and min cues initially then able to participate without increase in cues. Attempted two other standing balance activities, however full body tremors stopped patient from being able to participate. Patient returned to room and left supine in bed with all needs within reach and bed alarm on.  Patient verbally perseverative throughout session and requires constant redirection to maintain attention to tasks.  Therapy Documentation Precautions:  Precautions Precautions: Fall Precaution Comments: body tremors during functional mobility  Restrictions Weight Bearing Restrictions: No Pain: Pain Assessment Pain Assessment: No/denies pain Pain Score: 0-No pain Locomotion : Ambulation Ambulation/Gait Assistance: 5: Supervision Wheelchair Mobility Distance: 175   See FIM for current functional status  Therapy/Group: Individual Therapy  Lillia Abed. Arun Herrod, PT, DPT 06/28/2013, 3:54 PM

## 2013-06-28 NOTE — Progress Notes (Signed)
Social Work Patient ID: [name], male   DOB: Apr 02, 1955, 58 y.o.   MRN: [id number]  Lennart Pall, LCSW Social Worker Signed  Patient Care Conference Service date: 06/28/2013 10:39 AM  Inpatient RehabilitationTeam Conference and Plan of Care Update Date: 06/27/2013   Time: 3:05 PM     Patient Name: Shaun Lutz       Medical Record Number: [id number]   Date of Birth: 07-20-1955 Sex: Male         Room/Bed: 4W20C/4W20C-01 Payor Info: Payor: MEDICARE / Plan: MEDICARE PART A AND B / Product Type: *No Product type* /   Admitting Diagnosis: neurosyphyllis   Admit Date/Time:  06/21/2013  4:15 PM Admission Comments: No comment available   Primary Diagnosis:  <principal problem not specified> Principal Problem: <principal problem not specified>    Patient Active Problem List     Diagnosis  Date Noted   .  HIV (human immunodeficiency virus infection)  06/21/2013   .  Malnutrition of moderate degree  06/08/2013   .  Human immunodeficiency virus (HIV) disease  06/07/2013   .  Syphilis in male  06/07/2013   .  Lower extremity weakness  06/07/2013   .  Syphilis  06/07/2013   .  Abnormality of gait  05/22/2013   .  Decreased hearing of both ears  11/23/2012   .  Hypotension  08/22/2012   .  Physical deconditioning  08/10/2012   .  Underweight  08/10/2012   .  Occasional tremors  07/08/2012   .  At [place] for Falls  07/08/2012   .  Pacemaker- medtronic  05/13/2012   .  SYNCOPE  04/07/2010   .  OTHER&UNSPECIFIED ALCOHOL DEPENDENCE REMISSION  03/05/2010   .  COPD  03/05/2010   .  VITILIGO  03/05/2010   .  ATRIOVENTRICULAR BLOCK, MOBITZ TYPE II  03/05/2010     Expected Discharge Date: Expected Discharge Date: 06/30/13  Team Members Present: Physician leading conference: Dr. Alroy Dust Swartz;Dr. Alysia Penna Social Worker Present: Lennart Pall, LCSW Nurse Present: Elliot Cousin, RN PT Present: Bridgett Ripa, Cottie Banda, PT OT Present: Gareth Morgan, OT;Roanna Epley, COTA SLP  Present: Gunnar Fusi, SLP PPS Coordinator present : Ileana Ladd, Lelan Pons, RN, [facility]        Current Status/Progress  Goal  Weekly Team Focus   Medical     neurosyphilis. suicide precautions. poor awareness and memory  improve activity tolerance  manage ID issues, ?f/u TB testing.   Bowel/Bladder     Urine retention, requires cath q6h, continent of bowel.   Continent of bowel and bladder with med assistance.  Explorer reasons for urinary retention and discuss alternative methods.   Swallow/Nutrition/ Hydration            ADL's     LB bathing and dressing-min A; transfers-steady A/supervision; limited activity tolerance  supervision overall  activity tolerance, BADLs, family education, balance   Mobility     Close supervision-occasional min A  Supervision overall  functional endurance, safety during standing mobility, family education, d/c planning   Communication            Safety/Cognition/ Behavioral Observations    Mod-max A  Min A  working memory with use of strategies   Pain     No complaints of pain  n/a  n/a   Skin     Buttocks scratched open, skin dry and flaky, scrotum is open and draining, as per wound nurse apply with zinc on buttocks and  scrotum.  Continue skin integerity, no new skin break down, stay free from infection.  Continue to montior skin.    Rehab Goals Patient on target to meet rehab goals: Yes *See Care Plan and progress notes for long and short-term goals.    Barriers to Discharge:  poor cogniition, safety      Possible Resolutions to Barriers:    full time supervision      Discharge Planning/Teaching Needs:    home wtih girlfriend, Inez Catalina, providing 24/7 assistance      Team Discussion:    Reaching supervision goals overall (occ min assist as he can lose balance at times).  Limited awareness of his fatigue points.  Still requiring caths so may need foley for home.  Planning education with gf on Thursday.   Revisions to Treatment Plan:     D/c sitter - MD feels not a threat to himself.    Continued Need for Acute Rehabilitation Level of Care: The patient requires daily medical management by a physician with specialized training in physical medicine and rehabilitation for the following conditions: Daily direction of a multidisciplinary physical rehabilitation program to ensure safe treatment while eliciting the highest outcome that is of practical value to the patient.: Yes Daily medical management of patient stability for increased activity during participation in an intensive rehabilitation regime.: Yes Daily analysis of laboratory values and/or radiology reports with any subsequent need for medication adjustment of medical intervention for : Neurological problems;Other  Lennart Pall 06/28/2013, 10:39 AM

## 2013-06-28 NOTE — Progress Notes (Signed)
Social Work Discharge Note  The overall goal for the admission was met for:   Discharge location: Yes - home with Inez Catalina (girlfriend) to provide 24/7 assistance  Length of Stay: Yes - 9 days  Discharge activity level: Yes - supervision  Home/community participation: Yes  Services provided included: MD, RD, PT, OT, SLP, RN, TR, Pharmacy and [place]: Medicare  Follow-up services arranged: Home Health: RN, PT, OT, ST, SW, CNA via [place], DME: 16x16 lightweight w/c, cushion, rolling walker, 3n1 and tub bench all via [place], Other: Referral placed with [facility] for transport assistance and Patient/Family has no preference for HH/DME agencies  Comments (or additional information):  Patient/Family verbalized understanding of follow-up arrangements: Yes  Individual responsible for coordination of the follow-up plan: girlfriend to assist pt  Confirmed correct DME delivered: Lennart Pall 06/28/2013    Lennart Pall

## 2013-06-28 NOTE — Progress Notes (Signed)
PMR Admission Coordinator Pre-Admission Assessment  Patient: Shaun Lutz is an 58 y.o., male  MRN: [id number]  DOB: [birth year]  Height: 5' 8.11" (173 cm)  Weight: 54.3 kg (119 lb 11.4 oz)  Insurance Information  HMO: PPO: PCP: IPA: 80/20: yes OTHER: no HMO  PRIMARY: Medicare a and b Policy#: [id number] Subscriber: pt  Benefits: Phone #: palmetto online Name: 06/19/13  Eff. Date: 11/30/2012 Deduct: $1260 Out of Pocket Max: none Life Max: none  CIR: 100% SNF: 20 full days  Outpatient: 80% Co-Pay: 20%  Home Health: 100% Co-Pay: none  DME: 80% Co-Pay: 20%  Providers: pt choice   SECONDARY: none  Medicaid Application Date: tba Case Manager:  Pt gets his meds from [facility]. Inez Catalina is unaware of any Medicaid  Emergency Contact Information    Contact Information     Name  Relation  Home  Work  Mobile     Donnell,Betty  Significant other  [phone]          Current Medical History  Patient Admitting Diagnosis: Neurosyphilis, newly diagnosed HIV  History of Present Illness:Shaun Lutz is a 59 y.o. right handed male history of hypertension, COPD, heart block status post pacemaker. Patient lives with his girlfriend.  Admitted 06/07/2013 with progressive gait disorder as well as hand tremors with recent fall. Patient tested positive HIV as well as syphilis with RPR of 1:512. Lumbar puncture showed white blood cell count of 86 with 60% segmented neutrophils 35% lymphocytes. CSF protein greater than 600 and glucose of 7. Gram stain negative for any organisms and cryptococcal antigen on CSF was negative. 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 Infectious disease consulted plan treatment of 14 days of high-dose penicillin and 3 doses of weekly PCN. Started on ARV with workup ongoing. Subcutaneous heparin for DVT prophylaxis.  ON 4/20 psychiatry agreed that pt has passive thought of suicide and does not  currently require a sitter and it is to be discontinued.  Dr. Tommy Medal, Infectious Disease, reconsulted about antibiotic plan since pt developed a rash, concerning for penicillin sensitivity. Antibiotics switched to ceftriaxone and antiviral, Stribild, discontinued. New antiviral to be started. HIV genomic studies in process. HIV RNA quant approx 60,000; CD4 250. On 4/20 pt on IV solumedrol q 6hrs.  Past Medical History    Past Medical History    Diagnosis  Date    .  Hypertension     .  Second degree AV block     .  Syncope     .  Pacemaker -Medtronic       DOI 2012     Family History  family history includes Cancer in his mother; Heart disease in his brother, father, and sister.  Prior Rehab/Hospitalizations: none  Current Medications  Current facility-administered medications:0.9 % sodium chloride infusion, , Intravenous, Continuous, Langston Masker, MD, Last Rate: 75 mL/hr at 06/20/13 1728; aspirin chewable tablet 81 mg, 81 mg, Oral, Daily, Andrena Mews, MD, 81 mg at 06/21/13 1032; atenolol (TENORMIN) tablet 25 mg, 25 mg, Oral, Daily, Cordelia Poche, MD, 25 mg at 06/21/13 1032  cefTRIAXone (ROCEPHIN) 2 g in dextrose 5 % 50 mL IVPB, 2 g, Intravenous, Q24H, Truman Hayward, MD, 2 g at 06/20/13 [id number]; dolutegravir (TIVICAY) tablet 50 mg, 50 mg, Oral, Daily, Truman Hayward, MD, 50 mg at 06/21/13 1032; emtricitabine-tenofovir (TRUVADA) 200-300 MG per tablet 1 tablet, 1 tablet, Oral, Daily, Lavell Islam  Tommy Medal, MD, 1 tablet at 06/21/13 1032  escitalopram (LEXAPRO) tablet 5 mg, 5 mg, Oral, QHS, Langston Masker, MD, 5 mg at 06/20/13 2157; feeding supplement (ENSURE COMPLETE) (ENSURE COMPLETE) liquid 237 mL, 237 mL, Oral, TID, Erlene Quan, RD, 237 mL at 06/21/13 1320; haloperidol (HALDOL) tablet 0.5 mg, 0.5 mg, Oral, QHS, Cordelia Poche, MD; heparin injection 5,000 Units, 5,000 Units, Subcutaneous, 3 times per day, Langston Masker, MD, 5,000 Units at 06/21/13 6433  insulin aspart (novoLOG)  injection 0-5 Units, 0-5 Units, Subcutaneous, QHS, [name], MD, 2 Units at 06/20/13 2159; insulin aspart (novoLOG) injection 0-9 Units, 0-9 Units, Subcutaneous, TID WC, [name], MD, 3 Units at 06/20/13 1728; loratadine (CLARITIN) tablet 10 mg, 10 mg, Oral, Daily, Cordelia Poche, MD, 10 mg at 06/21/13 1032  [START ON 06/22/2013] predniSONE (DELTASONE) tablet 20 mg, 20 mg, Oral, Q breakfast, Langston Masker, MD; sodium chloride 0.9 % injection 3 mL, 3 mL, Intravenous, Q12H, Langston Masker, MD, 3 mL at 06/21/13 1000  Patients Current Diet: Regular diet  Precautions / Restrictions  Precautions  Precautions: Fall  Precaution Comments: body tremors during functional mobility  Restrictions  Weight Bearing Restrictions: No  Prior Activity Level  Limited Community (1-2x/wk): little community outings except md office, bank, etc  Inez Catalina states pt has become progressively weaker over last several few months. Tremors with up activities. Slow gait. Does not use AD. Has not fallen.Disabled due to heart issues. Did his own bathing and dressing. Did not drive.  [facility]  Home Assistive Devices / Equipment  Home Assistive Devices/Equipment: None  Home Equipment: None  Prior Functional Level  Prior Function  Level of Independence: Independent  Comments: states girlfriend is disabled and has an aide that does cleaning, cooking, etc  Current Functional Level    Cognition  Overall Cognitive Status: Impaired/Different from baseline  Orientation Level: Oriented to person;Oriented to place;Disoriented to time;Disoriented to situation  Following Commands: Follows one step commands with increased time;Follows one step commands inconsistently  Safety/Judgement: Decreased awareness of safety  General Comments: pt able to recognize having to urinate. catheter fell off while standing; pt required standing position to use urinal    Extremity Assessment  (includes Sensation/Coordination)       ADLs  Overall ADL's : Needs assistance/impaired  Grooming: Wash/dry face;Set up;Standing (with mod A for balance)  Grooming Details (indicate cue type and reason): mod A for balane/support standing at sink  Upper Body Bathing: Set up;Min guard;Sitting  Lower Body Bathing: Moderate assistance;Sit to/from stand  Upper Body Dressing : Sitting;Set up;Min guard  Lower Body Dressing: Minimal assistance;Sitting/lateral leans (to donn socks at EOB)  Toilet Transfer: Minimal assistance (recliner next to bed)  Toilet Transfer Details (indicate cue type and reason): temors increased when initiating sit - stand. Able to stand at sink x 5 minutes for grooming tasks wiht mod A for balance/support  Toileting- Clothing Manipulation and Hygiene: Total assistance;Sit to/from stand  Functional mobility during ADLs: Minimal assistance;Moderate assistance;+2 for physical assistance  General ADL Comments: tremors, decreased coordination/balance. Pt stood at sink x 5 minutes for grooming tasks before LOB, then seated on 3 in 1 to complete tasks    Mobility  Overal bed mobility: Needs Assistance  Bed Mobility: Supine to Sit  Rolling: Min guard  Sidelying to sit: Min assist;HOB elevated  Supine to sit: Supervision;HOB elevated  Sit to supine: Supervision;HOB elevated  Sit to sidelying: Min guard  General bed mobility comments: cues for hand placement and  sequencing; pt required min (A) to elevate trunk to sitting position at EOB; pt requires incr time; termors throughout    Transfers  Overall transfer level: Needs assistance  Equipment used: Rolling walker (2 wheeled)  Transfers: Sit to/from Stand  Sit to Stand: Mod assist  Stand pivot transfers: Mod assist;+2 physical assistance  General transfer comment: pt with posterior lean while standing; LEs bracing on bed; mod (A) to maintain balance; cues for hand placement and sequencing    Ambulation / Gait / Stairs / Wheelchair Mobility  Ambulation/Gait   Ambulation/Gait assistance: Mod assist  Ambulation Distance (Feet): 150 Feet  Assistive device: Rolling walker (2 wheeled)  Gait Pattern/deviations: Step-through pattern;Shuffle;Decreased stride length;Trunk flexed;Narrow base of support;Drifts right/left  Gait velocity: slow; guarded for falls  Gait velocity interpretation: Below normal speed for age/gender  General Gait Details: pt able to ambulate increased distance without rest break today; 2 person (A) helpful for safety; pt with LE tremors and buckling inconsistently; cues for sequencing    Posture / Balance  Dynamic Sitting Balance  Sitting balance - Comments: UE supported; tolerated sitting 3 min    Special needs/care consideration  Skin Skin with hyperpigmented lesions. Rash appeared last week with extensive areas of excoriation. Placed on IV steroids, now po 4/22. Exfoliation on face and back.  Bowel mgmt: incontinent  Bladder mgmt:continent  Diabetic mgmt no    Previous Home Environment  Living Arrangements: Non-relatives/Friends  Available Help at Discharge: Other (Comment) (girlfriend)  Type of Home: House  Home Layout: One level  Home Access: Stairs to enter  Entrance Stairs-Rails: Right  Entrance Stairs-Number of Steps: 4  Bathroom Shower/Tub: Administrator, Civil Service: Standard  Home Care Services: Yes  Type of Home Care Services: Home PT  Discharge Living Setting  Plans for Discharge Living Setting: Patient's home;Lives with (comment) (lives with girlfriend of 16 years)  Type of Home at Discharge: House  Discharge Home Layout: One level  Discharge Home Access: Stairs to enter  Entrance Stairs-Rails: Right  Entrance Stairs-Number of Steps: 4  Discharge Bathroom Shower/Tub: [place] unit  Discharge Bathroom Toilet: Standard  Discharge Bathroom Accessibility: Yes  How Accessible: Accessible via walker  Does the patient have any problems obtaining your medications?: No  Inez Catalina states she always takes care  of family who are unable to help themselves and that Alec has also always taken care of family members.  Social/Family/Support Systems  Patient Roles: Partner;Other (Comment) (estranged from his adult children)  Contact Information: Fredrik Cove, girlfriend of 8 years  Anticipated Caregiver: Inez Catalina  Anticipated Caregiver's Contact Information: see above  Ability/Limitations of Caregiver: can provide min assist level  Caregiver Availability: 24/7  Discharge Plan Discussed with Primary Caregiver: Yes  Is Caregiver In Agreement with Plan?: Yes  Does Caregiver/Family have Issues with Lodging/Transportation while Pt is in Rehab?: No Inez Catalina likes to stay with pt in hospital. Does not drive)  Inez Catalina, designated POA but has not have papers.  Pt has been told about his diagnosis 06/07/13 by [facility] as well as girlfriend. Dr. Tommy Medal has also discussed with pt at bedside, but pt does not remember dx from one discussion to the next with same MD.(day to day)  Sister is unaware of dx.  Goals/Additional Needs  Patient/Family Goal for Rehab: supervision with PT, OT, and SLP  Expected length of stay: ELOS 14 to 18 days  Equipment Needs: New 042 diagnosis. Pt confused and states he has infection on he brain  Special Service Needs: suicide  Tour manager. Psychiatry states lacks the capacity to make own decisions  Additional Information: Girlfriend is aware of diagnosis and plans to get herself tested. Pt's sisters visit but are unaware of diagnosis  Pt/Family Agrees to Admission and willing to participate: Yes  Program Orientation Provided & Reviewed with Pt/Caregiver Including Roles & Responsibilities: Yes  Additional Information Needs: pt with new diagnosis. Inez Catalina has herself a PCS aide that does cleaning for her 5 days per week after she had knee surgery. They do not assist with bathing her, or cooking. Inez Catalina walks with a limp  Decrease burden of Care through IP rehab admission: n/a  Possible  need for SNF placement upon discharge:I discussed with Inez Catalina, girlfriend, and she does not want SNF.Marland Kitchen She states she and pt have always assisted other family members during illnesses and she plans to do the same for him. SHe will let helathcare providers know if she gets to a point that she can not manage.  Patient Condition: This patient's medical and functional status has changed since the consult dated: 06/15/13 in which the Rehabilitation Physician determined and documented that the patient's condition is appropriate for intensive rehabilitative care in an inpatient rehabilitation facility. See "History of Present Illness" (above) for medical update. Functional changes are: overall mod assist. Patient's medical and functional status update has been discussed with the Rehabilitation physician and patient remains appropriate for inpatient rehabilitation. Will admit to inpatient rehab today.  Preadmission Screen Completed By: Cleatrice Burke, 06/21/2013 1:45 PM  ______________________________________________________________________  Discussed status with Dr. Naaman Plummer on 06/21/13 at 1345 and received telephone approval for admission today.  Admission Coordinator: Cleatrice Burke, time 8101 Date 06/21/13.    Cosigned by: [name], MD [06/21/2013 2:43 PM]

## 2013-06-28 NOTE — Plan of Care (Signed)
Problem: RH Awareness Goal: LTG: Patient will demonstrate intellectual/emergent (PT) LTG: Patient will demonstrate intellectual/emergent/anticipatory awareness with assist during a mobility activity (PT)  Downgraded due to consistent need for cueing for safety during standing mobility

## 2013-06-28 NOTE — Progress Notes (Signed)
Speech Language Pathology Daily Session Note  Patient Details  Name: Shaun Lutz MRN: [id number] Date of Birth: 1955/08/01  Today's Date: 06/28/2013 Time: 0835-0900 Time Calculation (min): 25 min  Short Term Goals: Week 1: SLP Short Term Goal 1 (Week 1): STGs = LTGs due to anticipated brief length of stay  Skilled Therapeutic Interventions: Skilled treatment session focused on cognitive goals. SLP facilitated session by providing Mod A question cues for utilization of notebook for visual aid to increase recall and carryover of new information.  Pt recalled events from morning therapy sessions with Mod I. Continue with current plan of care.    FIM:  Comprehension Comprehension Mode: Auditory Comprehension: 5-Understands basic 90% of the time/requires cueing < 10% of the time Expression Expression Mode: Verbal Expression: 5-Expresses complex 90% of the time/cues < 10% of the time Social Interaction Social Interaction: 5-Interacts appropriately 90% of the time - Needs monitoring or encouragement for participation or interaction. Problem Solving Problem Solving: 4-Solves basic 75 - 89% of the time/requires cueing 10 - 24% of the time Memory Memory: 3-Recognizes or recalls 50 - 74% of the time/requires cueing 25 - 49% of the time  Pain Pain Assessment Pain Score: 2   Therapy/Group: Individual Therapy  Buzzy Han 06/28/2013, 12:37 PM

## 2013-06-28 NOTE — Progress Notes (Signed)
Patient still with urinary retention in light of Flomax and Urecholine. Patient is unable to do self catheterizations and girlfriend is uncomfortable with the procedure. Requests were made for indwelling Foley catheter

## 2013-06-28 NOTE — Progress Notes (Signed)
Social Work Patient ID: Shaun Lutz, male   DOB: 1955-07-27, 58 y.o.   MRN: [id number]  Have reviewed team conference with pt and gf.  Both aware targeted d/c date for 5/1 with [place] follow up to be arranged.  Inez Catalina to be in tomorrow for family education.    Lennart Pall, LCSW

## 2013-06-28 NOTE — Progress Notes (Signed)
[facility] PHYSICAL MEDICINE & REHABILITATION     PROGRESS NOTE    Subjective/Complaints: No problems overnight. No suicidal ideations reported A 12 point review of systems has been performed and if not noted above is otherwise negative.   Objective: Vital Signs: Blood pressure 126/79, pulse 94, temperature 98.1 F (36.7 C), temperature source Oral, resp. rate 20, height 5\' 8"  (1.727 m), weight 58 kg (127 lb 13.9 oz), SpO2 98.00%. No results found. No results found for this basename: WBC, HGB, HCT, PLT,  in the last 72 hours No results found for this basename: NA, K, CL, CO, GLUCOSE, BUN, CREATININE, CALCIUM,  in the last 72 hours CBG (last 3)   Recent Labs  06/27/13 1653 06/27/13 2055 06/28/13 0715  GLUCAP 220* 156* 215*    Wt Readings from Last 3 Encounters:  06/28/13 58 kg (127 lb 13.9 oz)  06/18/13 54.3 kg (119 lb 11.4 oz)  06/07/13 52.481 kg (115 lb 11.2 oz)    Physical Exam:  Constitutional:  58 year old male appearing older than stated age.  HENT:  Poor dentition/ oral mucosa pink/moist  Eyes: EOM are normal.  Neck: Normal range of motion. Neck supple. No thyromegaly present.  Cardiovascular: Normal rate and regular rhythm. No murmur  Respiratory: Effort normal and breath sounds normal. No respiratory distress. No wheezes  GI: Soft. Bowel sounds are normal. He exhibits no distension.  Neurological: He is alert.  Patient follows simple commands. He was able to provide his name and place.  Doesn't remember any of yesterdays activities   Limited insight and awareness.  intention tremor bilateral lower extremities greater than bilateral upper extremities  Motor strength is 4+/5 bilateral deltoid, bicep, tricep, grip bilaterally. LE's 3+ prox HF, 4- KE and 4/5 at ankles.  Skin: vitiligo diffusely, diffuse maculopapular drug rash which is resolving--some exfoliation Psych: calm, non-distressed     Assessment/Plan: 1. Functional deficits secondary to  neurosyphilis which require 3+ hours per day of interdisciplinary therapy in a comprehensive inpatient rehab setting. Physiatrist is providing close team supervision and 24 hour management of active medical problems listed below. Physiatrist and rehab team continue to assess barriers to discharge/monitor patient progress toward functional and medical goals. FIM: FIM - Bathing Bathing Steps Patient Completed: Chest;Right Arm;Left Arm;Abdomen;Front perineal area Bathing: 3: Mod-Patient completes 5-7 62f 10 parts or 50-74%  FIM - Upper Body Dressing/Undressing Upper body dressing/undressing steps patient completed: Thread/unthread right sleeve of pullover shirt/dresss;Thread/unthread left sleeve of pullover shirt/dress;Put head through opening of pull over shirt/dress;Pull shirt over trunk Upper body dressing/undressing: 5: Set-up assist to: Obtain clothing/put away FIM - Lower Body Dressing/Undressing Lower body dressing/undressing steps patient completed: Pull pants up/down Lower body dressing/undressing: 2: Max-Patient completed 25-49% of tasks  FIM - Toileting Toileting steps completed by patient: Adjust clothing prior to toileting Toileting: 2: Max-Patient completed 1 of 3 steps (per Caro Hight, NT)  FIM - Toilet Transfers Toilet Transfers Assistive Devices: Grab bars Toilet Transfers: 0-Activity did not occur  FIM - Control and instrumentation engineer Devices: Arm rests Bed/Chair Transfer: 4: Chair or W/C > Bed: Min A (steadying Pt. > 75%)  FIM - Locomotion: Wheelchair Distance: 175' Locomotion: Wheelchair: 2: Travels 26 - 149 ft with supervision, cueing or coaxing FIM - Locomotion: Ambulation Locomotion: Ambulation Assistive Devices: Administrator Ambulation/Gait Assistance: 5: Supervision Locomotion: Ambulation: 5: Travels 150 ft or more with supervision/safety issues  Comprehension Comprehension Mode: Auditory Comprehension: 5-Understands basic 90% of the  time/requires cueing < 10% of the time  Expression Expression Mode: Verbal Expression: 5-Expresses complex 90% of the time/cues < 10% of the time  Social Interaction Social Interaction: 5-Interacts appropriately 90% of the time - Needs monitoring or encouragement for participation or interaction.  Problem Solving Problem Solving: 5-Solves basic 90% of the time/requires cueing < 10% of the time  Memory Memory: 5-Recognizes or recalls 90% of the time/requires cueing < 10% of the time  Medical Problem List and Plan:  1. Neurosyphilis: s/p PCN 2. DVT Prophylaxis/Anticoagulation: Subcutaneous heparin.    3. Pain Management: Tylenol as needed  4. Mood/depression: Lexapro 5 mg daily. Followup per psychiatry services   -mood has been pleasant and up beat thus far over the first two days of rehab  -don't how much credibility can be given to his suicidal ideations given his cognitive status 5. Neuropsych: This patient is not capable of making decisions on his own behalf.  6. Urticarial drug rash-felt to be secondary penicillin. Rash improving prednisone taper. Contact precautions for open lesions.--decrease prednisone to 10mg    7. COPD. Check oxygen saturations every shift  8. Heart block status post pacemaker. Chest pain or shortness of breath  9. Hypertension. Tenormin 25 mg daily. Monitor with increased mobility 10. 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 Urine retention: UA neg, ucx negative  -flomax and urecholine trial  -I/O cath prn. Still retaining 13. DM: adjust regimen  LOS (Days) 7 A FACE TO FACE EVALUATION WAS PERFORMED  [name] 06/28/2013 8:10 AM

## 2013-06-28 NOTE — Progress Notes (Signed)
Occupational Therapy Session Note  Patient Details  Name: Shaun Lutz MRN: [id number] Date of Birth: [birth year]  Today's Date: 06/28/2013 Time: 0700-0735 Time Calculation (min): 35 min  Short Term Goals: Week 1:  OT Short Term Goal 1 (Week 1): STG=LTG due to anticipated briefl length of stay OT Short Term Goal 2 (Week 1): Pateint will complete upper body bathing sitting at sink with   Skilled Therapeutic Interventions/Progress Updates:    Pt asleep in bed upon arrival but easily aroused and agreeable to engaging in bathing at shower level and dressing with sit<>stand from EOB.  Pt amb with RW to bathroom (steady A.) After sitting approx 10 mins on shower seat patient expressed that the pain in his right buttocks was so intense that he had to stand up.  Pt amb to sink and donned shirt while standing at sink.  Pt exhibited increased BLE tremors and requested to sit down because his "legs were getting weak." Pt sat EOB to don pants but required extra assistance this moring with LB dressing.  Pt stated the pain in his buttocks was "really bad" and he had to lay down.  Pt was apologetic throughout session because he wasn't able to participate as much as usual. Focus remained in bed with bed alarm on.  Focus on activity tolerance, functional amb with RW, standing balance, and safety awareness.  Therapy Documentation Precautions:  Precautions Precautions: Fall Precaution Comments: body tremors during functional mobility  Restrictions Weight Bearing Restrictions: No General: General Amount of Missed OT Time (min): 25 Minutes Pain: Pain Assessment Pain Assessment: 0-10 Pain Score: 10-Worst pain ever Pain Type: Acute pain Pain Location: Buttocks Pain Orientation: Right Pain Descriptors / Indicators: Aching;Burning;Tender Pain Intervention(s): RN made aware;Repositioned ADL: ADL ADL Comments: see FIM  See FIM for current functional status  Therapy/Group: Individual Therapy  Leroy Libman 06/28/2013, 7:51 AM

## 2013-06-29 ENCOUNTER — Inpatient Hospital Stay (HOSPITAL_COMMUNITY): Payer: Medicare Other | Admitting: *Deleted

## 2013-06-29 ENCOUNTER — Inpatient Hospital Stay (HOSPITAL_COMMUNITY): Payer: Self-pay

## 2013-06-29 ENCOUNTER — Inpatient Hospital Stay (HOSPITAL_COMMUNITY): Payer: Medicare Other | Admitting: Speech Pathology

## 2013-06-29 ENCOUNTER — Telehealth: Payer: Self-pay | Admitting: Family Medicine

## 2013-06-29 ENCOUNTER — Encounter (HOSPITAL_COMMUNITY): Payer: Self-pay

## 2013-06-29 ENCOUNTER — Telehealth: Payer: Self-pay | Admitting: Neurology

## 2013-06-29 DIAGNOSIS — A5211 Tabes dorsalis: Secondary | ICD-10-CM | POA: Diagnosis not present

## 2013-06-29 DIAGNOSIS — R5381 Other malaise: Secondary | ICD-10-CM | POA: Diagnosis not present

## 2013-06-29 LAB — GLUCOSE, CAPILLARY
Glucose-Capillary: 111 mg/dL — ABNORMAL HIGH (ref 70–99)
Glucose-Capillary: 191 mg/dL — ABNORMAL HIGH (ref 70–99)
Glucose-Capillary: 137 mg/dL — ABNORMAL HIGH (ref 70–99)
Glucose-Capillary: 138 mg/dL — ABNORMAL HIGH (ref 70–99)

## 2013-06-29 NOTE — Telephone Encounter (Signed)
Shaun Lutz w/ [facility]  Called regarding pt's lab results. Please call him at [phone]. He stated it was regarding his positive HIV test.

## 2013-06-29 NOTE — Progress Notes (Signed)
[facility] PHYSICAL MEDICINE & REHABILITATION     PROGRESS NOTE    Subjective/Complaints: No new issues. Still retaining urine. A 12 point review of systems has been performed and if not noted above is otherwise negative.   Objective: Vital Signs: Blood pressure 97/58, pulse 68, temperature 97.9 F (36.6 C), temperature source Oral, resp. rate 18, height 5\' 8"  (1.727 m), weight 58 kg (127 lb 13.9 oz), SpO2 98.00%. No results found. No results found for this basename: WBC, HGB, HCT, PLT,  in the last 72 hours No results found for this basename: NA, K, CL, CO, GLUCOSE, BUN, CREATININE, CALCIUM,  in the last 72 hours CBG (last 3)   Recent Labs  06/28/13 1635 06/28/13 2103 06/29/13 0741  GLUCAP 190* 121* 111*    Wt Readings from Last 3 Encounters:  06/28/13 58 kg (127 lb 13.9 oz)  06/18/13 54.3 kg (119 lb 11.4 oz)  06/07/13 52.481 kg (115 lb 11.2 oz)    Physical Exam:  Constitutional:  58 year old male appearing older than stated age.  HENT:  Poor dentition/ oral mucosa pink/moist  Eyes: EOM are normal.  Neck: Normal range of motion. Neck supple. No thyromegaly present.  Cardiovascular: Normal rate and regular rhythm. No murmur  Respiratory: Effort normal and breath sounds normal. No respiratory distress. No wheezes  GI: Soft. Bowel sounds are normal. He exhibits no distension.  Neurological: He is alert.  Patient follows simple commands. He was able to provide his name and place.  Doesn't remember any of yesterdays activities   Limited insight and awareness.  intention tremor bilateral lower extremities greater than bilateral upper extremities  Motor strength is 4+/5 bilateral deltoid, bicep, tricep, grip bilaterally. LE's 3+ prox HF, 4- KE and 4/5 at ankles.  Skin: vitiligo diffusely, diffuse maculopapular drug rash which is resolving--some exfoliation Psych: calm, non-distressed     Assessment/Plan: 1. Functional deficits secondary to neurosyphilis which require  3+ hours per day of interdisciplinary therapy in a comprehensive inpatient rehab setting. Physiatrist is providing close team supervision and 24 hour management of active medical problems listed below. Physiatrist and rehab team continue to assess barriers to discharge/monitor patient progress toward functional and medical goals. FIM: FIM - Bathing Bathing Steps Patient Completed: Chest;Right Arm;Left Arm;Abdomen;Front perineal area Bathing: 3: Mod-Patient completes 5-7 66f 10 parts or 50-74%  FIM - Upper Body Dressing/Undressing Upper body dressing/undressing steps patient completed: Thread/unthread right sleeve of pullover shirt/dresss;Thread/unthread left sleeve of pullover shirt/dress;Put head through opening of pull over shirt/dress;Pull shirt over trunk Upper body dressing/undressing: 5: Set-up assist to: Obtain clothing/put away FIM - Lower Body Dressing/Undressing Lower body dressing/undressing steps patient completed: Pull pants up/down Lower body dressing/undressing: 2: Max-Patient completed 25-49% of tasks  FIM - Toileting Toileting steps completed by patient: Adjust clothing prior to toileting Toileting: 2: Max-Patient completed 1 of 3 steps (per Caro Hight, NT)  FIM - Toilet Transfers Toilet Transfers Assistive Devices: Grab bars Toilet Transfers: 0-Activity did not occur  FIM - Control and instrumentation engineer Devices: Arm rests Bed/Chair Transfer: 4: Sit > Supine: Min A (steadying pt. > 75%/lift 1 leg);5: Supine > Sit: Supervision (verbal cues/safety issues);4: Bed > Chair or W/C: Min A (steadying Pt. > 75%);4: Chair or W/C > Bed: Min A (steadying Pt. > 75%)  FIM - Locomotion: Wheelchair Distance: 175 Locomotion: Wheelchair: 6: Travels 150 ft or more, turns around, maneuvers to table, bed or toilet, negotiates 3% grade: maneuvers on rugs and over door sills independently FIM - Locomotion: Ambulation  Locomotion: Ambulation Assistive Devices: Astronomer Ambulation/Gait Assistance: 5: Supervision Locomotion: Ambulation: 5: Travels 150 ft or more with supervision/safety issues  Comprehension Comprehension Mode: Auditory Comprehension: 5-Understands basic 90% of the time/requires cueing < 10% of the time  Expression Expression Mode: Verbal Expression: 5-Expresses complex 90% of the time/cues < 10% of the time  Social Interaction Social Interaction: 5-Interacts appropriately 90% of the time - Needs monitoring or encouragement for participation or interaction.  Problem Solving Problem Solving: 4-Solves basic 75 - 89% of the time/requires cueing 10 - 24% of the time  Memory Memory: 3-Recognizes or recalls 50 - 74% of the time/requires cueing 25 - 49% of the time  Medical Problem List and Plan:  1. Neurosyphilis: s/p PCN 2. DVT Prophylaxis/Anticoagulation: Subcutaneous heparin.    3. Pain Management: Tylenol as needed  4. Mood/depression: Lexapro 5 mg daily. Followup per psychiatry services   -mood has been pleasant and up beat thus far over the first two days of rehab  -don't how much credibility can be given to his suicidal ideations given his cognitive status 5. Neuropsych: This patient is not capable of making decisions on his own behalf.  6. Urticarial drug rash-felt to be secondary penicillin. Rash improving prednisone taper. Contact precautions for open lesions.--decrease prednisone to 10mg    7. COPD. Check oxygen saturations every shift  8. Heart block status post pacemaker. Chest pain or shortness of breath  9. Hypertension. Tenormin 25 mg daily. Monitor with increased mobility 10. 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 Urine retention: UA neg, ucx negative  -flomax and urecholine trial  -foley replaced---will likely need urology consult as outpt---ID will follow up 13. DM: adjust regimen  LOS (Days) 8 A  FACE TO FACE EVALUATION WAS PERFORMED  [name] 06/29/2013 8:13 AM

## 2013-06-29 NOTE — Discharge Summary (Signed)
Discharge summary job # [id number]

## 2013-06-29 NOTE — Plan of Care (Signed)
Problem: RH BLADDER ELIMINATION Goal: RH STG MANAGE BLADDER WITH ASSISTANCE STG Manage Bladder With min Assistance  Outcome: Not Progressing Foley catheter in place- will be discharged with it

## 2013-06-29 NOTE — Telephone Encounter (Signed)
Shaun Lutz called from the [facility] and [employer] office and did receive the lab reports but was not sure why we didn't call them when the test were positive. Please call him at [phone]. jw

## 2013-06-29 NOTE — Telephone Encounter (Signed)
Spoke with Shaun Lutz and advised that the test was performed at neurology and pt was sent straight to the hospital to the ID clinic.  Confirmed the race and address with Shaun Lutz. Shaun Lutz

## 2013-06-29 NOTE — Discharge Summary (Signed)
NAMESANCHEZ, HEMMER NO.:  [id number]  MEDICAL RECORD NO.:  [id number]  LOCATION:  4W20C                        FACILITY:  [facility]  PHYSICIAN:  [name], M.D.DATE OF BIRTH:  1955-07-15  DATE OF ADMISSION:  06/21/2013 DATE OF DISCHARGE:  06/30/2013                              DISCHARGE SUMMARY   DISCHARGE DIAGNOSES: 1. Neurosyphilis - human immunodeficiency virus. 2. Subcutaneous heparin for DVT prophylaxis. 3. Pain management. 4. Depression. 5. Urticarial drug rash - resolving. 6. Chronic obstructive pulmonary disease. 7. Heart block with pacemaker. 8. Hypertension. 9. Decreased nutritional storage. 10.Urinary retention.  HISTORY OF PRESENT ILLNESS:  This is a 58 year old right-handed male with history of hypertension, COPD, as well as pacemaker who presented with progressive gait disorder and tremor for a year.  He was admitted through the Neurology Service office on June 17, 2013, with findings of positive HIV as well as concerns of neurosyphilis.  Lumbar puncture showed a white blood cell count 86, with 60% segmented neutrophils, 35% lymphocytes.  CSF protein greater than 600 and glucose of 7.  Gram stain negative for any organisms and cryptococcal antigen on CSF was negative. CSF viral culture pending.  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  Dr. Tommy Medal of infectious Disease consulted, recommended 14 days of high-dose penicillin, 3 doses of penicillin weekly.  The patient had difficulty accepting diagnosis with high levels of anxiety and passive suicidal ideation.  Dr. Adele Schilder consulted for input, recommended Lexapro for depressed mood.  The patient with poor attention, impaired concentration, as well as poor memory.  He is not felt to have capacity to participate in his treatment plan.  He developed urticarial rash likely due to  drug reaction. Penicillin changed to ceftriaxone and Stribild discontinued.  He was started on Tivicay and Truvada on June 20, 2013.  He remained confused and disoriented with poor concentration and slow loose thought processes.  Physical and occupational therapy ongoing.  The patient was admitted for comprehensive rehab program.  PAST MEDICAL HISTORY:  See discharge diagnoses.  SOCIAL HISTORY:  Lives with significant other.  FUNCTIONAL HISTORY:  Prior to admission independent.  FUNCTIONAL STATUS:  Upon admission to rehab services was min to mod assist with activities of daily living, min to max assist to ambulate with a rolling walker.  PHYSICAL EXAMINATION:  VITAL SIGNS:  Blood pressure 128/96, pulse 77, temperature 97.3, respirations 16. GENERAL:  This was an alert male, appeared older than stated age.  He did follows simple commands.  He was able to provide his name and place. He was unsure of his age.  Generally confused with limited insight and awareness.  Intention tremor bilateral lower extremities, greater than bilateral upper extremities. LUNGS:  Clear to auscultation. CARDIAC:  Regular rate and rhythm. ABDOMEN:  Soft, nontender.  Good bowel sounds.  REHABILITATION HOSPITAL COURSE:  The patient was admitted for comprehensive rehab program.  The following issues were addressed. Pertaining to Mr. Garofano's neurosyphilis, newly diagnosed HIV, followed by Infectious Disease.  He completed his course of penicillin.  Due to rash, antibiotics had  been changed that continued to improve.  He was on a prednisone taper.  Subcutaneous heparin for DVT prophylaxis.  Noted depression, although his mood was pleasant and upbeat throughout his rehab stay.  He had followup per Psychiatry Services.  The patient had made some questions regarding suicidal ideations, although question credibility due to his cognitive status, a sitter was provided for safety.  All issues were discussed with  his girlfriend.  The patient with history of heart block with pacemaker in place.  No chest pain or shortness of breath.  Blood pressures remained controlled on Tenormin. He remained on his HIV regimen per Infectious Disease and plan was to followup as an outpatient.  Ongoing bouts of urinary retention. Urinalysis study negative.  He had been placed on Flomax and Urecholine trial.  Still with increased postvoid residuals.  The patient with difficulty with intermittent catheterization.  His girlfriend did not feel comfortable with this procedure and indwelling Foley catheter was later inserted for simplicity of patient care.  The patient received weekly collaborative interdisciplinary team conferences to discuss estimated length of stay, family teaching, and any barriers to discharge.  He could propel his wheelchair modified independent.  He was ambulating 174 feet with a rolling walker and close supervision, verbal cues for safety.  Stair negotiation with hand rail requiring minimal assist.  He needed frequent rest breaks due to fatigue.  The patient engaged in activities of daily living, able to sit to stand from edge of bed.  Ambulated to the bathroom to gather his belongings for dressing and bathing.  Ambulated to the sink to don and doff his shirt while standing at the sink.  Full family teaching was completed with his girlfriend and plan was to be discharged to home.  DISCHARGE MEDICATIONS: 1. Xanax 0.25 mg p.o. t.i.d. as needed. 2. Aspirin 81 mg p.o. daily. 3. Tenormin 25 mg p.o. daily. 4. Tivicay 50 mg p.o. daily. 5. Truvada 200-300 mg 1 tablet p.o. daily. 6. Lexapro 5 mg p.o. at bedtime. 7. Gerhards Butt cream apply b.i.d. to affected areas. 8. Prednisone 10 mg p.o. daily, taper as advised. 9. Flomax 0.4 mg p.o. daily.  DIET:  Mechanical soft.  SPECIAL INSTRUCTIONS:  The patient will follow up with Dr. Alger Simons at the Outpatient Rehab Service Office as needed.  Dr.  Rhina Brack[place], Infectious Disease in 2 weeks, call for appointment.  Dr. Adrian Blackwater on Jul 07, 2013.  SPECIAL INSTRUCTIONS:  The patient's subcutaneous heparin for DVT prophylaxis was discontinued at the time of discharge.  Foley catheter tube was in place for simplicity of care for urinary retention, was to be changed as directed.  Ongoing therapies arranged as per rehab services.     Lauraine Rinne, P.A.   ______________________________ [name], M.D.    DA/MEDQ  D:  06/29/2013  T:  06/29/2013  Job:  [id number]  cc:   Boykin Nearing, MD Alcide Evener, MD

## 2013-06-29 NOTE — Progress Notes (Signed)
Occupational Therapy Session Note  Patient Details  Name: Shaun Lutz MRN: [id number] Date of Birth: 08-06-1955  Today's Date: 06/29/2013  Session 1 Time: 0900-0955 Time Calculation (min): 55 min  Short Term Goals: Week 1:  OT Short Term Goal 1 (Week 1): STG=LTG due to anticipated briefl length of stay    Skilled Therapeutic Interventions/Progress Updates:    Pt's SO present for bathing and dressing tasks in tub room. Pt's SO provided appropriate supervision/assistance during tub transfer and bathing/dressing tasks.  Pt performed tub bench transfer at supervision level.  Pt continues to require assistance with LB dressing tasks.  Pt requires extra time to complete all tasks with multiple rest breaks.  Pt continues to exhibit BUE intention tremors when fatigues but employs appropriate compensatory strategies and rests until tremors resolve before continuing with task.  Focus on family education, activity tolerance, safety awareness, transfers, standing balance, and functional amb with RW.  Therapy Documentation Precautions:  Precautions Precautions: Fall Precaution Comments: body tremors during functional mobility  Restrictions Weight Bearing Restrictions: No   Pain: Pain Assessment Pain Assessment: Faces Faces Pain Scale: Hurts whole lot Pain Type: Acute pain Pain Location: Buttocks Pain Orientation: Right Pain Descriptors / Indicators: Aching;Burning;Tender ADL: ADL ADL Comments: see FIM  See FIM for current functional status  Therapy/Group: Individual Therapy  Session 2 Time: 1300-1327 Pt denied pain Individual Therapy  Pt resting in bed upon arrival and agreeable to engaging in therapeutic activities in therapy gym and ADL apartment.  Pt amb with RW to gym for dynamic standing activities standing on compliant surface while using BUE to perform tasks.  Pt transitioned to ADL apartment for continued activities requiring him to retrieve items from floor while squating.   Pt completed all tasks at close supervision.  Focus on activity tolerance, dynamic standing balance, functional amb with RW, and safety awareness.   Shaun Lutz 06/29/2013, 10:04 AM

## 2013-06-29 NOTE — Progress Notes (Signed)
NUTRITION FOLLOW UP  Intervention:   1.  Supplements; continue Ensure Complete TID or protein shake of choice at home.  Continue to encourage high calorie, high protein nutrition at home.   Nutrition Dx:   Increased nutrient needs related to HIV as evidenced by estimated needs.   Goal:  Meet >/= 90% of estimated nutrition needs. Met.   Monitor:  PO intake, supplement acceptance, weight trends, labs   Assessment:   58 y.o. 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 He was admitted via Neurology office on 06/07/13 with positive HIV as well as syphilis and concerns of neurosyphilis.   Pt was admitted 4/22 to Inpatient Rehabilitation with the diagnosis of neurosyphilis. Pt sleeping at time of visit.  Notes plans for upcoming discharge.   Pt has been eating overall adequately at ~75% of meals.  He continues to drink Ensure daily which is appropriate.    Height: Ht Readings from Last 1 Encounters:  06/21/13 _0  (1.727 m)    Weight Status:   Wt Readings from Last 1 Encounters:  06/28/13 127 lb 13.9 oz (58 kg)    Re-estimated needs:  Kcal: 1650-1850 Protein: 65-80g Fluid: ~1.8 L/day  Skin: WNL  Diet Order: Dysphagia 3, thin   Intake/Output Summary (Last 24 hours) at 06/29/13 1446 Last data filed at 06/29/13 0510  Gross per 24 hour  Intake    120 ml  Output    850 ml  Net   -730 ml    Last BM: 4/28   Labs:   Recent Labs Lab 06/23/13 0600  NA 140  K 4.6  CL 102  CO2 28  BUN 24*  CREATININE 0.75  CALCIUM 8.3*  GLUCOSE 101*    CBG (last 3)   Recent Labs  06/28/13 2103 06/29/13 0741 06/29/13 1146  GLUCAP 121* 111* 191*    Scheduled Meds: . aspirin  81 mg Oral Daily  . atenolol  25 mg Oral Daily  . dolutegravir  50 mg Oral Daily  . emtricitabine-tenofovir  1 tablet Oral Daily  . escitalopram  5 mg Oral QHS  . feeding supplement (ENSURE COMPLETE)  237 mL Oral TID  . Gerhardt's butt  cream   Topical BID  . heparin  5,000 Units Subcutaneous 3 times per day  . insulin aspart  0-5 Units Subcutaneous QHS  . predniSONE  10 mg Oral Q breakfast  . tamsulosin  0.4 mg Oral QPC supper    Continuous Infusions:   Brynda Greathouse, MS RD LDN Clinical Inpatient Dietitian Pager: [phone] Weekend/After hours pager: [phone]

## 2013-06-29 NOTE — Progress Notes (Signed)
Speech Language Pathology Session Note & Discharge Summary  Patient Details  Name: Shaun Lutz MRN: [id number] Date of Birth: [birth year]  Today's Date: 06/29/2013 Time: 1110-1150 Time Calculation (min): 40 min  Skilled Therapeutic Interventions:  Skilled treatment session focused on patient/cargiver education with pt's girlfriend, Shaun Lutz. Both were educated on pt's current cognitive-linguistic function and strategies to utilize at home to increase working memory and recall of new information. Both verbalized and demonstrated understanding and handout also given to reinforce information. Pt will discharge home tomorrow with 24 hour supervision from family.   Patient has met 4 of 4 long term goals.  Patient to discharge at overall Min;Mod level.   Reasons goals not met: N/A   Clinical Impression/Discharge Summary: Pt has made functional gains and has met 4 of 4 LTG's this admission due to increased orientation, working memory, problem solving and awareness. Currently, pt is overall Min-Mod A for overall cognitive function with basic and familiar tasks. Pt/family education complete and pt will discharge home with 24 hour supervision from family. Pt would benefit from f/u skilled SLP intervention to maximize cognitive function and overall functional independence to reduce caregiver burden.   Care Partner:  Caregiver Able to Provide Assistance: Yes  Type of Caregiver Assistance: Physical;Cognitive  Recommendation:  24 hour supervision/assistance;Home Health SLP  Rationale for SLP Follow Up: Reduce caregiver burden;Maximize cognitive function and independence   Equipment: N/A   Reasons for discharge: Treatment goals met;Discharged from hospital   Patient/Family Agrees with Progress Made and Goals Achieved: Yes   See FIM for current functional status  Buzzy Han 06/29/2013, 12:02 PM

## 2013-06-29 NOTE — Progress Notes (Signed)
Occupational Therapy Discharge Summary  Patient Details  Name: Shaun Lutz MRN: [id number] Date of Birth: May 30, 1955  Today's Date: 06/29/2013  Patient has met 8 of 8 long term goals due to improved activity tolerance, improved balance, improved attention and improved awareness.  Pt made steady progress with BADLs during this admission.  Pt continues to require extra time to complete tasks but demonstrates appropriate and safe strategies when fatigued and employs appropriate energy conservation strategies.  Pt continues to exhibit BUE and BLE tremors when fatigued.  Pt's SO has been present for therapy sessions and participated in BADL sessions providing appropriate supervision/assistance during session.  Patient to discharge at overall Supervision level.  Patient's care partner is independent to provide the necessary physical and cognitive assistance at discharge.     Recommendation:  Patient will benefit from ongoing skilled OT services in home health setting to continue to advance functional skills in the area of BADL and iADL.  [name] continues to demonstrate limited endurance as well as LE weakness and some decreased memory/awareness.  He needs 24 hour supervision initially for safety but feel he will benefit from continued OT services to further increase his level of independence. His family has been educated on safe assistance techniques and they understand that he requires 24 hour supervision at this time.     Equipment: BSC, tub transfer bench  Reasons for discharge: treatment goals met and discharge from hospital  Patient/family agrees with progress made and goals achieved: Yes  OT Discharge  ADL ADL ADL Comments: see FIM Vision/Perception  Vision- History Baseline Vision/History: Wears glasses Wears Glasses: At all times Patient Visual Report: No change from baseline Vision- Assessment Vision Assessment?: No apparent visual deficits  Cognition Overall Cognitive Status:  Impaired/Different from baseline Arousal/Alertness: Awake/alert Orientation Level: Oriented X4 Attention: Sustained Sustained Attention: Appears intact Selective Attention: Impaired Selective Attention Impairment: Functional basic Memory: Impaired Memory Impairment: Storage deficit;Decreased recall of new information;Decreased short term memory Decreased Short Term Memory: Functional basic;Verbal basic Awareness: Impaired Awareness Impairment: Emergent impairment;Intellectual impairment Problem Solving: Impaired Problem Solving Impairment: Functional basic Sensation Sensation Light Touch: Appears Intact Stereognosis: Appears Intact Hot/Cold: Appears Intact Proprioception: Appears Intact Motor  Motor Motor: Other (comment) Motor - Skilled Clinical Observations: Generalized weakness, Intention tremor in B UE/LE   Trunk/Postural Assessment  Cervical Assessment Cervical Assessment: Within Functional Limits Thoracic Assessment Thoracic Assessment: Within Functional Limits Lumbar Assessment Lumbar Assessment: Within Functional Limits  Balance Static Sitting Balance Static Sitting - Balance Support: Bilateral upper extremity supported;Feet supported Static Sitting - Level of Assistance: 7: Independent Dynamic Sitting Balance Dynamic Sitting - Balance Support: Feet supported Dynamic Sitting - Level of Assistance: 5: Stand by assistance Extremity/Trunk Assessment RUE Assessment RUE Assessment: Within Functional Limits LUE Assessment LUE Assessment: Within Functional Limits  See FIM for current functional status  Leroy Libman 06/29/2013, 6:45 AM  Clyda Greener, OTR/L 06/29/2013

## 2013-06-29 NOTE — Plan of Care (Signed)
Problem: RH Floor Transfers Goal: LTG Patient will perform floor transfers w/assist (PT) LTG: Patient will perform floor transfers with assistance (PT).  Outcome: Not Met (add Reason) Patient did not attempt secondary to pain from wound on buttocks. Demonstration performed for patient and significant other, Shaun Lutz, both of whom verbalized understanding.

## 2013-06-29 NOTE — Telephone Encounter (Signed)
LMOM for [facility] to call back. If he returns the call - please get a fax number and what he needs released to him.

## 2013-06-29 NOTE — Progress Notes (Signed)
Physical Therapy Discharge Summary  Patient Details  Name: Shaun Lutz MRN: [id number] Date of Birth: 1956/02/03  Today's Date: 06/29/2013 Time: 1000-1050 Time Calculation (min): 50 min  Patient has met 11 of 12 long term goals due to improved activity tolerance, improved balance, improved postural control, increased strength, ability to compensate for deficits, functional use of  right upper extremity, right lower extremity, left upper extremity and left lower extremity, improved attention, improved awareness and improved coordination.  Patient to discharge at an ambulatory level Supervision.   Patient's care partner, Inez Catalina, is independent to provide the necessary physical and cognitive assistance at discharge. Family education/training has been completed with Inez Catalina with focus on providing overall supervision level of assistance with all functional mobility; emphasis on safety and use of equipment (especially wheelchair) to maintain patient safety during onset of tremors as patient is required to sit immediately upon onset of tremors due to high falls risk.  Reasons goals not met: Patient did not meet floor transfer goal--not attempted secondary to pain from wound on buttocks. Demonstration performed for patient and significant other, Inez Catalina, who verbalized understanding.  Recommendation:  Patient will benefit from ongoing skilled PT services in home health setting to continue to advance safe functional mobility, address ongoing impairments in strength, balance, coordination, activity tolerance, overall functional mobility, and minimize fall risk.  Equipment: RW, 16x16 wheelchair and standard cushion  Reasons for discharge: treatment goals met and discharge from hospital  Patient/family agrees with progress made and goals achieved: Yes  Skilled Interventions: Today's session focused on safety with all functional mobility in preparation for d/c home tomorrow, 06/30/13. Patient performed car  transfer to sedan height with RW and supervision. Patient and significant other, Inez Catalina, verbalized understanding and in agreement with discharge recommendations.  PT Discharge Precautions/Restrictions Precautions Precautions: Fall Precaution Comments: body tremors during functional mobility  Restrictions Weight Bearing Restrictions: No Pain Pain Assessment Pain Assessment: Faces Pain Score: 0-No pain Faces Pain Scale: Hurts whole lot Pain Type: Acute pain Pain Location: Buttocks Pain Orientation: Right Pain Descriptors / Indicators: Aching;Burning;Tender Cognition Overall Cognitive Status: Impaired/Different from baseline Arousal/Alertness: Awake/alert Orientation Level: Oriented X4 Attention: Sustained Sustained Attention: Appears intact Selective Attention: Impaired Selective Attention Impairment: Functional basic Memory: Impaired Memory Impairment: Storage deficit;Decreased recall of new information;Decreased short term memory Decreased Short Term Memory: Functional basic;Verbal basic Awareness: Impaired Awareness Impairment: Emergent impairment;Intellectual impairment Problem Solving: Impaired Problem Solving Impairment: Functional basic Sensation Sensation Light Touch: Appears Intact Stereognosis: Appears Intact Hot/Cold: Appears Intact Proprioception: Appears Intact Coordination Gross Motor Movements are Fluid and Coordinated: No Fine Motor Movements are Fluid and Coordinated: No Coordination and Movement Description: Intention tremors noted in all 4 extremities during mobility, moderate at R UE, mild at L UE Motor  Motor Motor: Other (comment) (intention tremor with movements) Motor - Skilled Clinical Observations: Generalized weakness, Intention tremor in B UE/LE Motor - Discharge Observations: Generalized weakness, Intention tremor in B UE/LE  Mobility Bed Mobility Bed Mobility: Supine to Sit;Sit to Supine Supine to Sit: 5: Supervision;HOB flat Supine to  Sit Details: Verbal cues for sequencing;Tactile cues for initiation Sit to Supine: 5: Supervision;HOB flat Sit to Supine - Details: Tactile cues for initiation;Verbal cues for sequencing Transfers Transfers: Yes Sit to Stand: 5: Supervision;With upper extremity assist;With armrests;From bed;From chair/3-in-1 Sit to Stand Details: Verbal cues for sequencing;Verbal cues for technique;Verbal cues for precautions/safety Stand to Sit: 5: Supervision;To chair/3-in-1;To bed;With armrests;With upper extremity assist Stand to Sit Details (indicate cue type and reason): Verbal cues for sequencing;Verbal cues  for technique;Verbal cues for precautions/safety Stand Pivot Transfers: 5: Supervision;With armrests (with RW) Stand Pivot Transfer Details: Verbal cues for sequencing;Verbal cues for technique;Verbal cues for precautions/safety Locomotion  Ambulation Ambulation: Yes Ambulation/Gait Assistance: 5: Supervision Ambulation Distance (Feet): 180 Feet Assistive device: Rolling walker Ambulation/Gait Assistance Details: Verbal cues for safe use of DME/AE;Verbal cues for precautions/safety Ambulation/Gait Assistance Details: Functional ambulation in controlled environment 155' x1 and home environment (in ADL apartment) 36' (25' x2) with RW and close supervision provided by girlfriend, Inez Catalina. Patient with one episode of body tremors and Inez Catalina able to provide hands on assistance and return to sitting to keep patient safe. Gait Gait: Yes Gait Pattern: Impaired Gait Pattern: Decreased hip/knee flexion - left;Decreased hip/knee flexion - right;Trunk flexed;Narrow base of support;Decreased stride length Stairs / Additional Locomotion Stairs: Yes Stairs Assistance: 5: Supervision Stairs Assistance Details: Verbal cues for precautions/safety Stairs Assistance Details (indicate cue type and reason): R rail ascending, L rail descending (to simulate STE home) Stair Management Technique: One rail Right;One rail  Left;Forwards Number of Stairs: 5 Height of Stairs: 6 Wheelchair Mobility Wheelchair Mobility: Yes Wheelchair Assistance: 6: Modified independent (Device/Increase time) Environmental health practitioner: Both upper extremities Wheelchair Parts Management: Supervision/cueing Distance: 200  Trunk/Postural Assessment  Cervical Assessment Cervical Assessment: Within Water engineer Thoracic Assessment: Within Functional Limits Lumbar Assessment Lumbar Assessment: Within Functional Limits Postural Control Postural Control: Deficits on evaluation Righting Reactions: delayed  Balance Balance Balance Assessed: Yes Static Sitting Balance Static Sitting - Balance Support: Bilateral upper extremity supported;Feet supported;No upper extremity supported Static Sitting - Level of Assistance: 7: Independent Dynamic Sitting Balance Dynamic Sitting - Balance Support: Feet supported Dynamic Sitting - Level of Assistance: 6: Modified independent (Device/Increase time) Dynamic Sitting - Balance Activities: Forward lean/weight shifting;Lateral lean/weight shifting;Reaching for objects Static Standing Balance Static Standing - Balance Support: Bilateral upper extremity supported;During functional activity Static Standing - Level of Assistance: 5: Stand by assistance Dynamic Standing Balance Dynamic Standing - Balance Support: During functional activity;Bilateral upper extremity supported Dynamic Standing - Level of Assistance: 5: Stand by assistance Extremity Assessment  RLE Assessment RLE Assessment: Exceptions to [facility] RLE Strength RLE Overall Strength Comments: Grossly 4-/5 LLE Assessment LLE Assessment: Exceptions to [facility] LLE Strength LLE Overall Strength Comments: Grossly 4-/5  See FIM for current functional status  Shonika Kolasinski S Kayde Warehime S. Refoel Palladino, PT, DPT 06/29/2013, 10:57 AM

## 2013-06-30 DIAGNOSIS — A523 Neurosyphilis, unspecified: Secondary | ICD-10-CM | POA: Diagnosis not present

## 2013-06-30 DIAGNOSIS — R5381 Other malaise: Secondary | ICD-10-CM

## 2013-06-30 LAB — GLUCOSE, CAPILLARY: Glucose-Capillary: 101 mg/dL — ABNORMAL HIGH (ref 70–99)

## 2013-06-30 LAB — HIV-1 GENOTYPR PLUS

## 2013-06-30 MED ORDER — ESCITALOPRAM OXALATE 5 MG PO TABS: 5.0000 mg | ORAL_TABLET | Freq: Every day | ORAL | Status: DC

## 2013-06-30 MED ORDER — TAMSULOSIN HCL 0.4 MG PO CAPS: 0.4000 mg | ORAL_CAPSULE | Freq: Every day | ORAL | Status: DC

## 2013-06-30 MED ORDER — ASPIRIN 81 MG PO TABS: 81.0000 mg | ORAL_TABLET | Freq: Every day | ORAL | Status: DC

## 2013-06-30 MED ORDER — ATENOLOL 25 MG PO TABS: 25.0000 mg | ORAL_TABLET | Freq: Every day | ORAL | Status: DC

## 2013-06-30 MED ORDER — PREDNISONE 10 MG PO TABS: 10.0000 mg | ORAL_TABLET | Freq: Every day | ORAL | Status: DC

## 2013-06-30 MED ORDER — EMTRICITABINE-TENOFOVIR DF 200-300 MG PO TABS: 1.0000 | ORAL_TABLET | Freq: Every day | ORAL | Status: DC

## 2013-06-30 MED ORDER — ALPRAZOLAM 0.25 MG PO TABS: 0.2500 mg | ORAL_TABLET | Freq: Three times a day (TID) | ORAL | Status: DC | PRN

## 2013-06-30 MED ORDER — DOLUTEGRAVIR SODIUM 50 MG PO TABS: 50.0000 mg | ORAL_TABLET | Freq: Every day | ORAL | Status: DC

## 2013-06-30 NOTE — Progress Notes (Signed)
Patient discharged home.  Left floor, via wheelchair, escorted by nursing staff and significant other.  All patient belongings, including DME, taken with patient.  Patient and significant other verbalized understanding of discharge instructions, no additional questions asked.  VSS.  Appears to be in no immediate distress at this time.  Brita Romp, RN

## 2013-06-30 NOTE — Progress Notes (Signed)
PHYSICAL MEDICINE & REHABILITATION     PROGRESS NOTE    Subjective/Complaints: No new problems A 12 point review of systems has been performed and if not noted above is otherwise negative.   Objective: Vital Signs: Blood pressure 117/73, pulse 71, temperature 97.9 F (36.6 C), temperature source Oral, resp. rate 20, height 5\' 8"  (1.727 m), weight 58 kg (127 lb 13.9 oz), SpO2 98.00%. No results found. No results found for this basename: WBC, HGB, HCT, PLT,  in the last 72 hours No results found for this basename: NA, K, CL, CO, GLUCOSE, BUN, CREATININE, CALCIUM,  in the last 72 hours CBG (last 3)   Recent Labs  06/29/13 1621 06/29/13 2120 06/30/13 0713  GLUCAP 137* 138* 101*    Wt Readings from Last 3 Encounters:  06/28/13 58 kg (127 lb 13.9 oz)  06/18/13 54.3 kg (119 lb 11.4 oz)  06/07/13 52.481 kg (115 lb 11.2 oz)    Physical Exam:  Constitutional:  58 year old male appearing older than stated age.  HENT:  Poor dentition/ oral mucosa pink/moist  Eyes: EOM are normal.  Neck: Normal range of motion. Neck supple. No thyromegaly present.  Cardiovascular: Normal rate and regular rhythm. No murmur  Respiratory: Effort normal and breath sounds normal. No respiratory distress. No wheezes  GI: Soft. Bowel sounds are normal. He exhibits no distension.  Neurological: He is alert.  Patient follows simple commands. He was able to provide his name and place.  Doesn't remember any of yesterdays activities   Limited insight and awareness.  intention tremor bilateral lower extremities greater than bilateral upper extremities  Motor strength is 4+/5 bilateral deltoid, bicep, tricep, grip bilaterally. LE's 3+ prox HF, 4- KE and 4/5 at ankles.  Skin: vitiligo diffusely, diffuse maculopapular drug rash which is resolving--some exfoliation Psych: calm, non-distressed     Assessment/Plan: 1. Functional deficits secondary to neurosyphilis which require 3+ hours per day of  interdisciplinary therapy in a comprehensive inpatient rehab setting. Physiatrist is providing close team supervision and 24 hour management of active medical problems listed below. Physiatrist and rehab team continue to assess barriers to discharge/monitor patient progress toward functional and medical goals.  Home today, education provided. Supervision still required at home  FIM: FIM - Bathing Bathing Steps Patient Completed: Chest;Right Arm;Left Arm;Abdomen;Front perineal area;Buttocks;Right upper leg;Left upper leg;Right lower leg (including foot);Left lower leg (including foot) Bathing: 5: Supervision: Safety issues/verbal cues  FIM - Upper Body Dressing/Undressing Upper body dressing/undressing steps patient completed: Thread/unthread right sleeve of pullover shirt/dresss;Thread/unthread left sleeve of pullover shirt/dress;Put head through opening of pull over shirt/dress;Pull shirt over trunk Upper body dressing/undressing: 5: Set-up assist to: Obtain clothing/put away FIM - Lower Body Dressing/Undressing Lower body dressing/undressing steps patient completed: Thread/unthread right pants leg;Thread/unthread left pants leg;Pull pants up/down;Fasten/unfasten pants;Don/Doff right sock;Don/Doff left sock Lower body dressing/undressing: 4: Min-Patient completed 75 plus % of tasks  FIM - Toileting Toileting steps completed by patient: Adjust clothing prior to toileting;Performs perineal hygiene;Adjust clothing after toileting Toileting: 5: Supervision: Safety issues/verbal cues  FIM - Air cabin crew Transfers Assistive Devices: Elevated toilet seat;Walker Toilet Transfers: 5-To toilet/BSC: Supervision (verbal cues/safety issues);5-From toilet/BSC: Supervision (verbal cues/safety issues)  FIM - Engineer, site Assistive Devices: Arm rests;Walker Bed/Chair Transfer: 5: Supine > Sit: Supervision (verbal cues/safety issues);5: Sit > Supine: Supervision (verbal  cues/safety issues);5: Chair or W/C > Bed: Supervision (verbal cues/safety issues);5: Bed > Chair or W/C: Supervision (verbal cues/safety issues)  FIM - Locomotion: Wheelchair Distance: 200 Locomotion: Wheelchair:  6: Travels 150 ft or more, turns around, maneuvers to table, bed or toilet, negotiates 3% grade: maneuvers on rugs and over door sills independently FIM - Locomotion: Ambulation Locomotion: Ambulation Assistive Devices: Walker - Rolling Ambulation/Gait Assistance: 5: Supervision Locomotion: Ambulation: 5: Travels 150 ft or more with supervision/safety issues  Comprehension Comprehension Mode: Auditory Comprehension: 5-Understands basic 90% of the time/requires cueing < 10% of the time  Expression Expression Mode: Verbal Expression: 5-Expresses complex 90% of the time/cues < 10% of the time  Social Interaction Social Interaction: 5-Interacts appropriately 90% of the time - Needs monitoring or encouragement for participation or interaction.  Problem Solving Problem Solving: 4-Solves basic 75 - 89% of the time/requires cueing 10 - 24% of the time  Memory Memory: 3-Recognizes or recalls 50 - 74% of the time/requires cueing 25 - 49% of the time  Medical Problem List and Plan:  1. Neurosyphilis: s/p PCN 2. DVT Prophylaxis/Anticoagulation: Subcutaneous heparin.    3. Pain Management: Tylenol as needed  4. Mood/depression: Lexapro 5 mg daily. Followup per psychiatry services as outpt  -don't how much credibility can be given to his suicidal ideations given his cognitive status 5. Neuropsych: This patient is not capable of making decisions on his own behalf.  6. Urticarial drug rash-felt to be secondary penicillin. Rash improving prednisone taper. Contact precautions for open lesions.--decrease prednisone to 10mg    7. COPD. Check oxygen saturations every shift  8. Heart block status post pacemaker. Chest pain or shortness of breath  9. Hypertension. Tenormin 25 mg daily. Monitor  with increased mobility 10. 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 Urine retention: UA neg, ucx negative  -flomax--continue, urecholine held given foley  -foley replaced---will likely need urology consult as outpt---ID will follow up 13. DM: adjust regimen  LOS (Days) 9 A FACE TO FACE EVALUATION WAS PERFORMED  [name] 06/30/2013 9:20 AM

## 2013-07-05 ENCOUNTER — Telehealth: Payer: Self-pay | Admitting: *Deleted

## 2013-07-05 NOTE — Telephone Encounter (Signed)
Can we get this guy SPAP??? He is NOT COMPETENT, HE HAS HIV +/- NEUROSYPHILIS AND is a set up for bad things to happen if we dont keep him on meds that are being given by his spouse. He literally forgets his diagnoses on day to day basis, and then threatends to kill himself when he finds out.  Can we bring them to meet with TIsh and get SPAP done urgently. It would have been nice of CM had looked into this prior to DC he spent 2 + weeks inpatient plus rehab to now not have meds??

## 2013-07-05 NOTE — Telephone Encounter (Signed)
Received request from Dr. Tommy Medal to follow up with the patient after his discharge.  Spoke with his partner Inez Catalina.  She stated that she could not afford the HIV medication after discharge, that it was $3000 with his medicare.  RN transferred the call to Sharyn Lull to discuss patient assistance available. Per Sharyn Lull, patient will give the release to Medicare for Inez Catalina to have access to his financials.  Partner understands that timing matters and this is an urgent matter.  Pt is to follow up May 19th with Dr. Tommy Medal. Landis Gandy, RN

## 2013-07-05 NOTE — Telephone Encounter (Signed)
We will follow him to make sure that he has access to the support in place.

## 2013-07-06 LAB — FUNGUS CULTURE W SMEAR: FUNGAL SMEAR: NONE SEEN

## 2013-07-07 ENCOUNTER — Ambulatory Visit (INDEPENDENT_AMBULATORY_CARE_PROVIDER_SITE_OTHER): Payer: Medicare Other | Admitting: Family Medicine

## 2013-07-07 ENCOUNTER — Encounter: Payer: Self-pay | Admitting: Family Medicine

## 2013-07-07 ENCOUNTER — Telehealth: Payer: Self-pay | Admitting: Family Medicine

## 2013-07-07 VITALS — BP 108/73 | HR 94 | Temp 97.9°F

## 2013-07-07 DIAGNOSIS — F17200 Nicotine dependence, unspecified, uncomplicated: Secondary | ICD-10-CM | POA: Diagnosis not present

## 2013-07-07 DIAGNOSIS — R339 Retention of urine, unspecified: Secondary | ICD-10-CM | POA: Diagnosis not present

## 2013-07-07 DIAGNOSIS — B20 Human immunodeficiency virus [HIV] disease: Secondary | ICD-10-CM

## 2013-07-07 DIAGNOSIS — I441 Atrioventricular block, second degree: Secondary | ICD-10-CM | POA: Diagnosis not present

## 2013-07-07 DIAGNOSIS — R3581 Nocturnal polyuria: Secondary | ICD-10-CM | POA: Insufficient documentation

## 2013-07-07 DIAGNOSIS — Z21 Asymptomatic human immunodeficiency virus [HIV] infection status: Secondary | ICD-10-CM

## 2013-07-07 DIAGNOSIS — Z720 Tobacco use: Secondary | ICD-10-CM

## 2013-07-07 DIAGNOSIS — R351 Nocturia: Secondary | ICD-10-CM | POA: Insufficient documentation

## 2013-07-07 NOTE — Assessment & Plan Note (Signed)
Contemplative about cessation.  Address every visit.

## 2013-07-07 NOTE — Assessment & Plan Note (Addendum)
A: untreated due to access to medication. No evidence of OI. Has excellent support from spouse.  P: keep ID f/u for SPAP.  Hopefully restart truvada and tivicay.  PCP f/u in 4-6 weeks address mood.

## 2013-07-07 NOTE — Progress Notes (Signed)
   Subjective:    Patient ID: Shaun Lutz, male    DOB: 17-Jun-1955, 58 y.o.   MRN: [id number] CC: HFU for HIV and neurosyphilis  HPI 58 yo M presents with his longterm partner of 92 years, Shaun Lutz, for f/u visit:  1. HFU: patient hospitalized for neurosyphilis and HIV. From the hospital he went to in patient rehab. His discharge has been complicated by inability to obtain HAART therapy due to cost. Patient is also in denial regarding his diagnosis. He preferred not to discuss his HIV. He denies depression unless HIV is brought up. He is denies cough, fever, chills, new skin rash, insomnia, anorexia.   He and Shaun Lutz are keeping up with appointments. He has appointment at [facility] on 07/15/13 for SPAP.   2. HTN/tachy-brady syndrome: taking atenolol and ACE inhibitor. No CP or SOB. Gets shaky/tremulous when he stands. Has a wheelchair and walker at home. Getting home health PT and RN 3x/week.   3. Depression: denies SI, HI, anorexia, insomnia. Not taking BZ or lexapro.   4. Urinary retention: foley in place. No pain. No hematuria. Not taking flomax.   Review of Systems As per HPI Skin: rash with flaking. Improving. Not using prednisone.     Objective:   Physical Exam BP 108/73  Pulse 94  Temp(Src) 97.9 F (36.6 C) (Oral) General appearance: alert, cooperative and no distress Head: Normocephalic, without obvious abnormality, atraumatic Throat: normal findings: lips normal without lesions and oropharynx pink & moist without lesions or evidence of thrush and abnormal findings: dentition: poor Lungs: clear to auscultation bilaterally Heart: regular rate and rhythm, S1, S2 normal, no murmur, click, rub or gallop Abdomen: soft, non-tender; bowel sounds normal; no masses,  no organomegaly Pelvic: external genitalia normal, foley catheter in place, yellow urine.  Extremities: extremities normal, atraumatic, no cyanosis or edema, skin flaky, no evidence of excoriation.        Assessment & Plan:

## 2013-07-07 NOTE — Assessment & Plan Note (Signed)
A: foley in place. Not on flomax. P: Start flomax Urology referral

## 2013-07-07 NOTE — Assessment & Plan Note (Signed)
A: normal HR on BB. Low BP P: Stop ACEi Monitor BP with restarting flomax.

## 2013-07-07 NOTE — Telephone Encounter (Signed)
Called patient's pharmacy to discontinue medications that he does not plan to take and to check cost of flomax.

## 2013-07-07 NOTE — Patient Instructions (Addendum)
Mr. Mancil and Inez Catalina,  Thank you for coming in today. It was a pleasure meeting you both. You are doing an awesome job with managing appointments.  1. Regarding HAART- keep ID appt for assistance with medications.   2. Regarding urinary retention- foley in place. Site is clean. Please start flomax to aid. The cost $ 64.92. I have placed a referral to urology for foley removal.   3. Regarding HTN-BP low/normal. I recommend stopping lisinopril. Especially since flomax can lower BP.   Follow up with Dr. Kennon Rounds in 4-6 weeks to f/u HAART, mood, and urinary retention.   Dr. Adrian Blackwater

## 2013-07-10 ENCOUNTER — Telehealth: Payer: Self-pay | Admitting: Family Medicine

## 2013-07-10 NOTE — Telephone Encounter (Signed)
Inez Catalina called and would like Dr. Adrian Blackwater to call her concerning Shaun Lutz. jw

## 2013-07-10 NOTE — Telephone Encounter (Signed)
Spoke with Inez Catalina and she was wondering about the status of patient's urology appt.  Will forward to marines to check on this.  She is wanting this appt ASAP since he has a foley catheter.  Thanks [employer]

## 2013-07-11 NOTE — Telephone Encounter (Signed)
I will be more that happy to process the referral for your pt but, unfortunately the referral are VERY behind and I give priority to the urgent one. Referral ws placed on 07/07/2013   I will try my best to take care the pts but, my hours at [facility] are very limited and I don't process as much referral I used to. Now I am her just morning time :(  [employer]

## 2013-07-13 ENCOUNTER — Telehealth: Payer: Self-pay | Admitting: *Deleted

## 2013-07-13 NOTE — Telephone Encounter (Signed)
That is REALLY really too bad and frankly this is sloppy on case of those who discharged him. He spent nearly 3-4 weeks in the hospital and they couldn't get him lined up with th meds to treat his underlying condition??

## 2013-07-13 NOTE — Telephone Encounter (Signed)
Patient's SO has been working with Sharyn Lull for [employer].  She received paperwork from social security in the mail today, but is unable to come before patient's appointment 5/19 (she is unable to leave him alone at the house).  She will bring all he needs for the ADAP application on the 16XI.  Patient has been off ARV since hospital discharge. Landis Gandy, RN

## 2013-07-18 ENCOUNTER — Encounter: Payer: Self-pay | Admitting: Infectious Disease

## 2013-07-18 ENCOUNTER — Ambulatory Visit (INDEPENDENT_AMBULATORY_CARE_PROVIDER_SITE_OTHER): Payer: Medicare Other | Admitting: Infectious Disease

## 2013-07-18 VITALS — BP 100/63 | HR 90 | Temp 98.4°F

## 2013-07-18 DIAGNOSIS — A523 Neurosyphilis, unspecified: Secondary | ICD-10-CM | POA: Diagnosis not present

## 2013-07-18 DIAGNOSIS — Z21 Asymptomatic human immunodeficiency virus [HIV] infection status: Secondary | ICD-10-CM

## 2013-07-18 DIAGNOSIS — B20 Human immunodeficiency virus [HIV] disease: Secondary | ICD-10-CM | POA: Diagnosis not present

## 2013-07-18 DIAGNOSIS — F0280 Dementia in other diseases classified elsewhere without behavioral disturbance: Secondary | ICD-10-CM | POA: Diagnosis not present

## 2013-07-18 LAB — COMPLETE METABOLIC PANEL WITH GFR
ALBUMIN: 2.8 g/dL — AB (ref 3.5–5.2)
ALK PHOS: 73 U/L (ref 39–117)
ALT: 24 U/L (ref 0–53)
AST: 26 U/L (ref 0–37)
BUN: 15 mg/dL (ref 6–23)
CO2: 29 mEq/L (ref 19–32)
Calcium: 8.5 mg/dL (ref 8.4–10.5)
Chloride: 101 mEq/L (ref 96–112)
Creat: 0.79 mg/dL (ref 0.50–1.35)
GFR, Est African American: >89 mL/min
GFR, Est Non African American: >89 mL/min
Glucose, Bld: 132 mg/dL — ABNORMAL HIGH (ref 70–99)
POTASSIUM: 4.3 meq/L (ref 3.5–5.3)
Sodium: 138 mEq/L (ref 135–145)
Total Bilirubin: 0.2 mg/dL (ref 0.2–1.2)
Total Protein: 7.3 g/dL (ref 6.0–8.3)

## 2013-07-18 MED ORDER — DOLUTEGRAVIR SODIUM 50 MG PO TABS: 50.0000 mg | ORAL_TABLET | Freq: Every day | ORAL | Status: DC

## 2013-07-18 MED ORDER — EMTRICITABINE-TENOFOVIR DF 200-300 MG PO TABS: 1.0000 | ORAL_TABLET | Freq: Every day | ORAL | Status: DC

## 2013-07-18 NOTE — Progress Notes (Signed)
   Subjective:    Patient ID: Shaun Lutz, male    DOB: 1955-03-14, 58 y.o.   MRN: [id number]  HPI  10 yearmale with with COPD, Pacemaker (medtronic 2012) frequent falls gait abnormality found to have HIV and Neurosyphilis. He received 13 days of IV therapy.  He had what was thought to be PCN rash and changed to rocephin last 2 days of therapy before pulling out his IV  He was placed on Tivicay and Truvada and was taking these for the near month he spent in inpatient hospital. Unfortunately apparently NO ONE properly investigated the fact that he had NO COVERAGE for ARV meds with his medicare plan prior to DC and understandably he could not afford the $3k his ARVS cost I was VERY upset about this. We brought in for urgent SPAP application. I tried to find spare bottles of ARVs to give him in the interim but we had no complete regimens.    Review of Systems  Unable to perform ROS Skin: Positive for rash.  Psychiatric/Behavioral: Negative for sleep disturbance.       Objective:   Physical Exam  Vitals reviewed. Constitutional: He appears well-nourished. No distress.  HENT:  Head: Normocephalic and atraumatic.  Eyes: Conjunctivae and EOM are normal. No scleral icterus.  Neck: Normal range of motion. Neck supple.  Cardiovascular: Normal rate, regular rhythm and normal heart sounds.  Exam reveals no gallop and no friction rub.   No murmur heard. Pulmonary/Chest: Effort normal and breath sounds normal. No respiratory distress. He has no wheezes.  Abdominal: He exhibits no distension. There is no tenderness.  Musculoskeletal: He exhibits no edema and no tenderness.  Neurological: He is alert.  Skin: Skin is warm and dry. Rash noted. He is not diaphoretic.  Psychiatric: His behavior is normal. His mood appears anxious. His speech is delayed. Cognition and memory are impaired. He exhibits abnormal recent memory.          Assessment & Plan:   HIV: check baseline labs today and get  URGENT SPAP application to start Tivicay and Truvada every day. I spent greater than 40 minutes with the patient including greater than 50% of time in face to face counsel of the patient and in coordination of their care.   Neurosyphilis: will need to follow titers and consider repeat LP. Given his apparent rash to PCN he would need desensitization for repeat treatment  Dementia, combination of Neurosyphilis +/- HIV dementia: not going to recover his antegrade memory it seems. Will need extensive support from his wife and caregiver  Indwelling foley for urinary retention: needs to fu with Urology

## 2013-07-18 NOTE — Patient Instructions (Signed)
We need to get labs today for new baseline  I am trying to find you medicines here in clinic  YOU OTHERWISE NEED URGENT SPAP/ADAP APPLICATION FILED TO GET YOUR   ANTIVIRALS AND THEY WILL NEED TO BE TAKEN EVERY DAY   WE WILL GET REPEAT BLOOD WORK ONE MONTH AFTER YOU HAVE BEEN ON YOUR MEDICINES AND THEN HAVE AN APPT TO COME SEE [place]

## 2013-07-19 LAB — T-HELPER CELL (CD4) - (RCID CLINIC ONLY)
CD4 % Helper T Cell: 18 % — ABNORMAL LOW (ref 33–55)
CD4 T CELL ABS: 310 /uL — AB (ref 400–2700)

## 2013-07-19 LAB — HIV-1 RNA QUANT-NO REFLEX-BLD
HIV 1 RNA QUANT: 199221 {copies}/mL — AB (ref <20)
HIV-1 RNA Quant, Log: 5.3 {Log} — ABNORMAL HIGH (ref <1.30)

## 2013-07-19 LAB — HEPATITIS A ANTIBODY, TOTAL: Hep A Total Ab: NONREACTIVE

## 2013-07-23 DIAGNOSIS — B20 Human immunodeficiency virus [HIV] disease: Secondary | ICD-10-CM | POA: Diagnosis not present

## 2013-07-23 DIAGNOSIS — R269 Unspecified abnormalities of gait and mobility: Secondary | ICD-10-CM | POA: Diagnosis not present

## 2013-07-23 DIAGNOSIS — M6281 Muscle weakness (generalized): Secondary | ICD-10-CM | POA: Diagnosis not present

## 2013-07-23 DIAGNOSIS — G250 Essential tremor: Secondary | ICD-10-CM | POA: Diagnosis not present

## 2013-07-23 DIAGNOSIS — H8110 Benign paroxysmal vertigo, unspecified ear: Secondary | ICD-10-CM | POA: Diagnosis not present

## 2013-07-23 DIAGNOSIS — A523 Neurosyphilis, unspecified: Secondary | ICD-10-CM | POA: Diagnosis not present

## 2013-07-23 LAB — AFB CULTURE WITH SMEAR (NOT AT ARMC): ACID FAST SMEAR: NONE SEEN

## 2013-07-24 DIAGNOSIS — A523 Neurosyphilis, unspecified: Secondary | ICD-10-CM | POA: Diagnosis not present

## 2013-07-24 DIAGNOSIS — R269 Unspecified abnormalities of gait and mobility: Secondary | ICD-10-CM | POA: Diagnosis not present

## 2013-07-24 DIAGNOSIS — M6281 Muscle weakness (generalized): Secondary | ICD-10-CM | POA: Diagnosis not present

## 2013-07-24 DIAGNOSIS — B20 Human immunodeficiency virus [HIV] disease: Secondary | ICD-10-CM | POA: Diagnosis not present

## 2013-07-24 DIAGNOSIS — H8110 Benign paroxysmal vertigo, unspecified ear: Secondary | ICD-10-CM | POA: Diagnosis not present

## 2013-07-24 DIAGNOSIS — G250 Essential tremor: Secondary | ICD-10-CM | POA: Diagnosis not present

## 2013-07-25 DIAGNOSIS — H8110 Benign paroxysmal vertigo, unspecified ear: Secondary | ICD-10-CM | POA: Diagnosis not present

## 2013-07-25 DIAGNOSIS — G250 Essential tremor: Secondary | ICD-10-CM | POA: Diagnosis not present

## 2013-07-25 DIAGNOSIS — A523 Neurosyphilis, unspecified: Secondary | ICD-10-CM | POA: Diagnosis not present

## 2013-07-25 DIAGNOSIS — M6281 Muscle weakness (generalized): Secondary | ICD-10-CM | POA: Diagnosis not present

## 2013-07-25 DIAGNOSIS — R269 Unspecified abnormalities of gait and mobility: Secondary | ICD-10-CM | POA: Diagnosis not present

## 2013-07-25 DIAGNOSIS — B20 Human immunodeficiency virus [HIV] disease: Secondary | ICD-10-CM | POA: Diagnosis not present

## 2013-07-26 DIAGNOSIS — B20 Human immunodeficiency virus [HIV] disease: Secondary | ICD-10-CM | POA: Diagnosis not present

## 2013-07-26 DIAGNOSIS — G252 Other specified forms of tremor: Secondary | ICD-10-CM | POA: Diagnosis not present

## 2013-07-26 DIAGNOSIS — G250 Essential tremor: Secondary | ICD-10-CM | POA: Diagnosis not present

## 2013-07-26 DIAGNOSIS — M6281 Muscle weakness (generalized): Secondary | ICD-10-CM | POA: Diagnosis not present

## 2013-07-26 DIAGNOSIS — H8110 Benign paroxysmal vertigo, unspecified ear: Secondary | ICD-10-CM | POA: Diagnosis not present

## 2013-07-26 DIAGNOSIS — R269 Unspecified abnormalities of gait and mobility: Secondary | ICD-10-CM | POA: Diagnosis not present

## 2013-07-26 DIAGNOSIS — A523 Neurosyphilis, unspecified: Secondary | ICD-10-CM | POA: Diagnosis not present

## 2013-07-27 DIAGNOSIS — H8110 Benign paroxysmal vertigo, unspecified ear: Secondary | ICD-10-CM | POA: Diagnosis not present

## 2013-07-27 DIAGNOSIS — G250 Essential tremor: Secondary | ICD-10-CM | POA: Diagnosis not present

## 2013-07-27 DIAGNOSIS — B20 Human immunodeficiency virus [HIV] disease: Secondary | ICD-10-CM | POA: Diagnosis not present

## 2013-07-27 DIAGNOSIS — G252 Other specified forms of tremor: Secondary | ICD-10-CM | POA: Diagnosis not present

## 2013-07-27 DIAGNOSIS — A523 Neurosyphilis, unspecified: Secondary | ICD-10-CM | POA: Diagnosis not present

## 2013-07-27 DIAGNOSIS — M6281 Muscle weakness (generalized): Secondary | ICD-10-CM | POA: Diagnosis not present

## 2013-07-27 DIAGNOSIS — R269 Unspecified abnormalities of gait and mobility: Secondary | ICD-10-CM | POA: Diagnosis not present

## 2013-07-31 ENCOUNTER — Encounter: Payer: Self-pay | Admitting: Neurology

## 2013-07-31 ENCOUNTER — Ambulatory Visit (INDEPENDENT_AMBULATORY_CARE_PROVIDER_SITE_OTHER): Payer: Medicare Other | Admitting: Neurology

## 2013-07-31 VITALS — BP 80/60 | HR 60 | Temp 97.0°F | Resp 14 | Ht 68.0 in | Wt 120.0 lb

## 2013-07-31 DIAGNOSIS — Z21 Asymptomatic human immunodeficiency virus [HIV] infection status: Secondary | ICD-10-CM | POA: Diagnosis not present

## 2013-07-31 DIAGNOSIS — A523 Neurosyphilis, unspecified: Secondary | ICD-10-CM

## 2013-07-31 DIAGNOSIS — B20 Human immunodeficiency virus [HIV] disease: Secondary | ICD-10-CM

## 2013-07-31 DIAGNOSIS — R339 Retention of urine, unspecified: Secondary | ICD-10-CM | POA: Diagnosis not present

## 2013-07-31 NOTE — Progress Notes (Signed)
Shaun Lutz was seen today in the movement disorders clinic for neurologic consultation at the request of PRATT,TANYA S, MD.  The consultation is for the evaluation of gait changes, tremor and to rule out NPH.  The pt is accompanied by his accompanied by his long term girlfriend of 16 years who supplements the history.  The pts girlfriend states that initially there was a balance problem prior to the PPM but that was more of passing out problem which resolved with the pacemaker.  However, over the last year they have noted increasing weakness in the legs and when the legs are weak, there is a tremor in the hands and legs.    06/07/13 update:  Pt was just seen 06/05/13.  I asked the pt to come in today for a work in appointment.  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  Pt absolutely denies hx of STD.  "I work hard, I don't cheat."  Doesn't reveal how many lifetime sexual partners despite repeated questioning.  Denies loss of weight.  After revealing labs, girlfriend invited back into the room at the patients request and labs once again revealed at patients request.  07/31/13 update:   The patient returns today after having a very long and complicated hospital course.  He was admitted to the hospital and treated for neurosyphilis.  At the end of his treatment, he developed a rash which was presumably an allergy to penicillin.  This was ultimately discontinued.  He was able to start HIV medication in the hospital, but unfortunately the finances of how to pay for this were not straightened out when he left the hospital, and the medication had to be stopped.  His infectious disease physician is trying to work this out now on an outpatient basis.  He was deemed incompetent by psychiatry while in the hospital because of dementia.  The patient's girlfriend brings him here today and thought that he was here to have his indwelling Foley catheter checked.  The patient reports that it  has been "falling out."  Otherwise, his girlfriend states that he has been doing better.  She feels that his mind has been more clear.  He has been released from the rehabilitation unit and has been working with physical therapy at home.  He has not had any falls.  She rarely sees the tremor now.  He has not had any hallucinations.   PREVIOUS MEDICATIONS: none to date  ALLERGIES:   Allergies  Allergen Reactions  . Penicillins     CURRENT MEDICATIONS:  Current Outpatient Prescriptions on File Prior to Visit  Medication Sig Dispense Refill  . aspirin 81 MG tablet Take 1 tablet (81 mg total) by mouth daily.  180 tablet  2  . atenolol (TENORMIN) 25 MG tablet Take 1 tablet (25 mg total) by mouth daily.  30 tablet  1  . dolutegravir (TIVICAY) 50 MG tablet Take 1 tablet (50 mg total) by mouth daily.  30 tablet  11  . emtricitabine-tenofovir (TRUVADA) 200-300 MG per tablet Take 1 tablet by mouth daily.  30 tablet  11  . tamsulosin (FLOMAX) 0.4 MG CAPS capsule Take 1 capsule (0.4 mg total) by mouth daily after supper.  30 capsule  1   No current facility-administered medications on file prior to visit.    PAST MEDICAL HISTORY:   Past Medical History  Diagnosis Date  . Hypertension   . Second degree AV block   .  Syncope   . Pacemaker -Medtronic     DOI 2012  . SYNCOPE 04/07/2010  . HIV (human immunodeficiency virus infection)   . Neurosyphilis   . HIV dementia     PAST SURGICAL HISTORY:   Past Surgical History  Procedure Laterality Date  . Pacemaker insertion      SOCIAL HISTORY:   History   Social History  . Marital Status: Divorced    Spouse Name: N/A    Number of Children: N/A  . Years of Education: N/A   Occupational History  . Not on file.   Social History Main Topics  . Smoking status: Current Every Day Smoker -- 0.25 packs/day for 35 years    Types: Cigarettes  . Smokeless tobacco: Not on file  . Alcohol Use: No  . Drug Use: No  . Sexual Activity: Not on file    Other Topics Concern  . Not on file   Social History Narrative  . No narrative on file    FAMILY HISTORY:   Family Status  Relation Status Death Age  . Mother Deceased     breast cancer  . Father Deceased     heart disease  . Brother Deceased     heart disease  . Sister Deceased     embolism  . Son Alive     healthy  . Daughter Alive     healthy    ROS:  A complete 10 system review of systems was obtained and was unremarkable apart from what is mentioned above.  PHYSICAL EXAMINATION:    VITALS:   Filed Vitals:   07/31/13 0911  BP: 80/60  Pulse: 60  Temp: 97 F (36.1 C)  Resp: 14  Height: 5\' 8"  (1.727 m)  Weight: 120 lb (54.432 kg)    Orientation: The patient is alert and oriented to month/date but not year. Scored 19/30 on MMSE on 06/05/13.  Cranial nerves: There is good facial symmetry but there is facial hypomimia. Pupils are equal round and reactive to light bilaterally. Fundoscopic exam is attempted but the disc margins are not well visualized bilaterally. Extraocular muscles are intact. The visual fields are full to confrontational testing. The speech is fluent and mildy dysarthric (mostly because of poor dentition). Soft palate rises symmetrically and there is no tongue deviation. Hearing is markedly decreased to conversational tone.  Motor: Strength is 5/5 in the bilateral upper and lower extremities. Shoulder shrug is equal and symmetric. There is no pronator drift.  Tone: There is normal tone bilaterally today. Abnormal movements: There is no tremor noted. Coordination: There is no significant decremation today. Gait and Station: The patient requires a walker to get out of the wheelchair.  With a walker, his stride length is good.    LABS:  Lab Results  Component Value Date   TSH 3.417 05/22/2013   Lab Results  Component Value Date   WBC 9.6 06/22/2013   HGB 10.6* 06/22/2013   HCT 32.6* 06/22/2013   MCV 93.7 06/22/2013   PLT 323 06/22/2013      Chemistry      Component Value Date/Time   NA 138 07/18/2013 1042   K 4.3 07/18/2013 1042   CL 101 07/18/2013 1042   CO2 29 07/18/2013 1042   BUN 15 07/18/2013 1042   CREATININE 0.79 07/18/2013 1042   CREATININE 0.75 06/23/2013 0600      Component Value Date/Time   CALCIUM 8.5 07/18/2013 1042   ALKPHOS 73 07/18/2013 1042   AST 26  07/18/2013 1042   ALT 24 07/18/2013 1042   BILITOT 0.2 07/18/2013 1042       ASSESSMENT/PLAN:  1.  HIV and neurosyphilis.  -The neurosyphilis has now been treated.  Infectious disease is following.  He will likely need repeat LP's to follow the RPR.  -Infectious disease is working on getting him financial assistance for his HIV medication.  -The patient no longer looked parkinsonian.  I suspect that what we were seeing was on a result of neurosyphilis. 2.  indwelling Foley.  -I. called urology and was able to get the patient an appointment today. 3.  he will followup with me on an as-needed basis.

## 2013-07-31 NOTE — Patient Instructions (Signed)
1. Please go to [facility] at [address] Your appt is today at 10:45 am.

## 2013-08-01 DIAGNOSIS — A523 Neurosyphilis, unspecified: Secondary | ICD-10-CM | POA: Diagnosis not present

## 2013-08-01 DIAGNOSIS — G250 Essential tremor: Secondary | ICD-10-CM | POA: Diagnosis not present

## 2013-08-01 DIAGNOSIS — H8110 Benign paroxysmal vertigo, unspecified ear: Secondary | ICD-10-CM | POA: Diagnosis not present

## 2013-08-01 DIAGNOSIS — M6281 Muscle weakness (generalized): Secondary | ICD-10-CM | POA: Diagnosis not present

## 2013-08-01 DIAGNOSIS — R269 Unspecified abnormalities of gait and mobility: Secondary | ICD-10-CM | POA: Diagnosis not present

## 2013-08-01 DIAGNOSIS — B20 Human immunodeficiency virus [HIV] disease: Secondary | ICD-10-CM | POA: Diagnosis not present

## 2013-08-02 DIAGNOSIS — R269 Unspecified abnormalities of gait and mobility: Secondary | ICD-10-CM | POA: Diagnosis not present

## 2013-08-02 DIAGNOSIS — A523 Neurosyphilis, unspecified: Secondary | ICD-10-CM | POA: Diagnosis not present

## 2013-08-02 DIAGNOSIS — H8110 Benign paroxysmal vertigo, unspecified ear: Secondary | ICD-10-CM | POA: Diagnosis not present

## 2013-08-02 DIAGNOSIS — M6281 Muscle weakness (generalized): Secondary | ICD-10-CM | POA: Diagnosis not present

## 2013-08-02 DIAGNOSIS — B20 Human immunodeficiency virus [HIV] disease: Secondary | ICD-10-CM | POA: Diagnosis not present

## 2013-08-02 DIAGNOSIS — G250 Essential tremor: Secondary | ICD-10-CM | POA: Diagnosis not present

## 2013-08-03 DIAGNOSIS — B20 Human immunodeficiency virus [HIV] disease: Secondary | ICD-10-CM | POA: Diagnosis not present

## 2013-08-03 DIAGNOSIS — R269 Unspecified abnormalities of gait and mobility: Secondary | ICD-10-CM | POA: Diagnosis not present

## 2013-08-03 DIAGNOSIS — A523 Neurosyphilis, unspecified: Secondary | ICD-10-CM | POA: Diagnosis not present

## 2013-08-03 DIAGNOSIS — G250 Essential tremor: Secondary | ICD-10-CM | POA: Diagnosis not present

## 2013-08-03 DIAGNOSIS — H8110 Benign paroxysmal vertigo, unspecified ear: Secondary | ICD-10-CM | POA: Diagnosis not present

## 2013-08-03 DIAGNOSIS — M6281 Muscle weakness (generalized): Secondary | ICD-10-CM | POA: Diagnosis not present

## 2013-08-04 DIAGNOSIS — B20 Human immunodeficiency virus [HIV] disease: Secondary | ICD-10-CM | POA: Diagnosis not present

## 2013-08-04 DIAGNOSIS — M6281 Muscle weakness (generalized): Secondary | ICD-10-CM | POA: Diagnosis not present

## 2013-08-04 DIAGNOSIS — H8110 Benign paroxysmal vertigo, unspecified ear: Secondary | ICD-10-CM | POA: Diagnosis not present

## 2013-08-04 DIAGNOSIS — R269 Unspecified abnormalities of gait and mobility: Secondary | ICD-10-CM | POA: Diagnosis not present

## 2013-08-04 DIAGNOSIS — G250 Essential tremor: Secondary | ICD-10-CM | POA: Diagnosis not present

## 2013-08-04 DIAGNOSIS — A523 Neurosyphilis, unspecified: Secondary | ICD-10-CM | POA: Diagnosis not present

## 2013-08-07 ENCOUNTER — Encounter: Payer: Self-pay | Admitting: *Deleted

## 2013-08-07 DIAGNOSIS — R269 Unspecified abnormalities of gait and mobility: Secondary | ICD-10-CM | POA: Diagnosis not present

## 2013-08-07 DIAGNOSIS — A523 Neurosyphilis, unspecified: Secondary | ICD-10-CM | POA: Diagnosis not present

## 2013-08-07 DIAGNOSIS — M6281 Muscle weakness (generalized): Secondary | ICD-10-CM | POA: Diagnosis not present

## 2013-08-07 DIAGNOSIS — H8110 Benign paroxysmal vertigo, unspecified ear: Secondary | ICD-10-CM | POA: Diagnosis not present

## 2013-08-07 DIAGNOSIS — B20 Human immunodeficiency virus [HIV] disease: Secondary | ICD-10-CM | POA: Diagnosis not present

## 2013-08-07 DIAGNOSIS — G250 Essential tremor: Secondary | ICD-10-CM | POA: Diagnosis not present

## 2013-08-08 ENCOUNTER — Other Ambulatory Visit: Payer: Self-pay | Admitting: *Deleted

## 2013-08-08 ENCOUNTER — Telehealth: Payer: Self-pay

## 2013-08-08 DIAGNOSIS — M6281 Muscle weakness (generalized): Secondary | ICD-10-CM | POA: Diagnosis not present

## 2013-08-08 DIAGNOSIS — R269 Unspecified abnormalities of gait and mobility: Secondary | ICD-10-CM | POA: Diagnosis not present

## 2013-08-08 DIAGNOSIS — B20 Human immunodeficiency virus [HIV] disease: Secondary | ICD-10-CM | POA: Diagnosis not present

## 2013-08-08 DIAGNOSIS — G250 Essential tremor: Secondary | ICD-10-CM | POA: Diagnosis not present

## 2013-08-08 DIAGNOSIS — H8110 Benign paroxysmal vertigo, unspecified ear: Secondary | ICD-10-CM | POA: Diagnosis not present

## 2013-08-08 DIAGNOSIS — A523 Neurosyphilis, unspecified: Secondary | ICD-10-CM | POA: Diagnosis not present

## 2013-08-08 MED ORDER — EMTRICITABINE-TENOFOVIR DF 200-300 MG PO TABS: 1.0000 | ORAL_TABLET | Freq: Every day | ORAL | Status: DC

## 2013-08-08 MED ORDER — DOLUTEGRAVIR SODIUM 50 MG PO TABS: 50.0000 mg | ORAL_TABLET | Freq: Every day | ORAL | Status: DC

## 2013-08-08 NOTE — Telephone Encounter (Signed)
Patient partner called and advised the pharmacy needed Rx for the patient. Rx sent

## 2013-08-08 NOTE — Telephone Encounter (Signed)
[facility] called regarding Darrel's script but would not discuss with his girlfriend Inez Catalina.  She was told to call our office so we could call to give permission to discuss.   Inez Catalina is his caregiver and has been with patient for his office visits. She is the person resposible for making sure he gets his medicine.  I spoke with [facility] in [place] and gave permission for them to speak with Fredrik Cove regarding his medication and delivery options.   Laverle Patter, RN

## 2013-08-09 DIAGNOSIS — B20 Human immunodeficiency virus [HIV] disease: Secondary | ICD-10-CM | POA: Diagnosis not present

## 2013-08-09 DIAGNOSIS — M6281 Muscle weakness (generalized): Secondary | ICD-10-CM | POA: Diagnosis not present

## 2013-08-09 DIAGNOSIS — R269 Unspecified abnormalities of gait and mobility: Secondary | ICD-10-CM | POA: Diagnosis not present

## 2013-08-09 DIAGNOSIS — G250 Essential tremor: Secondary | ICD-10-CM | POA: Diagnosis not present

## 2013-08-09 DIAGNOSIS — H8110 Benign paroxysmal vertigo, unspecified ear: Secondary | ICD-10-CM | POA: Diagnosis not present

## 2013-08-09 DIAGNOSIS — A523 Neurosyphilis, unspecified: Secondary | ICD-10-CM | POA: Diagnosis not present

## 2013-08-10 DIAGNOSIS — B20 Human immunodeficiency virus [HIV] disease: Secondary | ICD-10-CM | POA: Diagnosis not present

## 2013-08-10 DIAGNOSIS — R269 Unspecified abnormalities of gait and mobility: Secondary | ICD-10-CM | POA: Diagnosis not present

## 2013-08-10 DIAGNOSIS — G252 Other specified forms of tremor: Secondary | ICD-10-CM | POA: Diagnosis not present

## 2013-08-10 DIAGNOSIS — A523 Neurosyphilis, unspecified: Secondary | ICD-10-CM | POA: Diagnosis not present

## 2013-08-10 DIAGNOSIS — G250 Essential tremor: Secondary | ICD-10-CM | POA: Diagnosis not present

## 2013-08-10 DIAGNOSIS — H8110 Benign paroxysmal vertigo, unspecified ear: Secondary | ICD-10-CM | POA: Diagnosis not present

## 2013-08-10 DIAGNOSIS — M6281 Muscle weakness (generalized): Secondary | ICD-10-CM | POA: Diagnosis not present

## 2013-08-14 DIAGNOSIS — R269 Unspecified abnormalities of gait and mobility: Secondary | ICD-10-CM | POA: Diagnosis not present

## 2013-08-14 DIAGNOSIS — G252 Other specified forms of tremor: Secondary | ICD-10-CM | POA: Diagnosis not present

## 2013-08-14 DIAGNOSIS — H8110 Benign paroxysmal vertigo, unspecified ear: Secondary | ICD-10-CM | POA: Diagnosis not present

## 2013-08-14 DIAGNOSIS — G250 Essential tremor: Secondary | ICD-10-CM | POA: Diagnosis not present

## 2013-08-14 DIAGNOSIS — B20 Human immunodeficiency virus [HIV] disease: Secondary | ICD-10-CM | POA: Diagnosis not present

## 2013-08-14 DIAGNOSIS — M6281 Muscle weakness (generalized): Secondary | ICD-10-CM | POA: Diagnosis not present

## 2013-08-14 DIAGNOSIS — A523 Neurosyphilis, unspecified: Secondary | ICD-10-CM | POA: Diagnosis not present

## 2013-08-15 DIAGNOSIS — M6281 Muscle weakness (generalized): Secondary | ICD-10-CM | POA: Diagnosis not present

## 2013-08-15 DIAGNOSIS — R269 Unspecified abnormalities of gait and mobility: Secondary | ICD-10-CM | POA: Diagnosis not present

## 2013-08-15 DIAGNOSIS — B20 Human immunodeficiency virus [HIV] disease: Secondary | ICD-10-CM | POA: Diagnosis not present

## 2013-08-15 DIAGNOSIS — G250 Essential tremor: Secondary | ICD-10-CM | POA: Diagnosis not present

## 2013-08-15 DIAGNOSIS — H8110 Benign paroxysmal vertigo, unspecified ear: Secondary | ICD-10-CM | POA: Diagnosis not present

## 2013-08-15 DIAGNOSIS — A523 Neurosyphilis, unspecified: Secondary | ICD-10-CM | POA: Diagnosis not present

## 2013-08-16 ENCOUNTER — Encounter: Payer: Medicare Other | Admitting: Physical Medicine & Rehabilitation

## 2013-08-17 ENCOUNTER — Ambulatory Visit (INDEPENDENT_AMBULATORY_CARE_PROVIDER_SITE_OTHER): Payer: Medicare Other | Admitting: Family Medicine

## 2013-08-17 ENCOUNTER — Encounter: Payer: Self-pay | Admitting: Family Medicine

## 2013-08-17 VITALS — BP 86/60 | HR 84 | Temp 97.8°F | Ht 68.0 in | Wt 123.4 lb

## 2013-08-17 DIAGNOSIS — G250 Essential tremor: Secondary | ICD-10-CM

## 2013-08-17 DIAGNOSIS — I441 Atrioventricular block, second degree: Secondary | ICD-10-CM | POA: Diagnosis not present

## 2013-08-17 DIAGNOSIS — M6281 Muscle weakness (generalized): Secondary | ICD-10-CM | POA: Diagnosis not present

## 2013-08-17 DIAGNOSIS — R269 Unspecified abnormalities of gait and mobility: Secondary | ICD-10-CM

## 2013-08-17 DIAGNOSIS — B20 Human immunodeficiency virus [HIV] disease: Secondary | ICD-10-CM | POA: Diagnosis not present

## 2013-08-17 DIAGNOSIS — R339 Retention of urine, unspecified: Secondary | ICD-10-CM

## 2013-08-17 DIAGNOSIS — A523 Neurosyphilis, unspecified: Secondary | ICD-10-CM

## 2013-08-17 DIAGNOSIS — G252 Other specified forms of tremor: Secondary | ICD-10-CM

## 2013-08-17 DIAGNOSIS — H8110 Benign paroxysmal vertigo, unspecified ear: Secondary | ICD-10-CM | POA: Diagnosis not present

## 2013-08-17 NOTE — Assessment & Plan Note (Signed)
Has not improved with treatment of neurosyphilis

## 2013-08-17 NOTE — Progress Notes (Signed)
    Subjective:    Patient ID: Shaun Lutz is a 58 y.o. male presenting with check foley and Hypertension  on 08/17/2013  HPI: Reports doing well post hospitalization.  Working on balance and continues PT for 3 more weeks.  Would like to remove catheter, for trial.  Placed due to retention and incontinence. Has seen Urology and taking Flomax.  BP is much lower while on this + atenolol.  He continues to be unsteady in his gait, but is using his walker.  He continues to see cards, Neuro and ID to help manage his many complicated health problems.  Review of Systems  Constitutional: Negative for fever and chills.  Respiratory: Negative for shortness of breath.   Cardiovascular: Negative for leg swelling.  Gastrointestinal: Negative for abdominal pain.  Neurological: Positive for tremors. Negative for seizures.      Objective:    BP 86/60  Pulse 84  Temp(Src) 97.8 F (36.6 C) (Oral)  Ht 5\' 8"  (1.727 m)  Wt 123 lb 6.4 oz (55.974 kg)  BMI 18.77 kg/m2 Physical Exam  Vitals reviewed. Constitutional: He appears cachectic. No distress.  HENT:  Head: Normocephalic and atraumatic.  Eyes: No scleral icterus.  Cardiovascular: Normal rate.   Pulmonary/Chest: Effort normal.  Abdominal: Soft.  Neurological: He is alert.  Intention tremor noted  Psychiatric: He has a normal mood and affect.    Foley discontinued    Assessment & Plan:   Problem List Items Addressed This Visit     Medium   ATRIOVENTRICULAR BLOCK, MOBITZ TYPE II     Have held his atenolol due to low BP (probably related to Flomax).  May need to restart if tachycardia resumes.      Unprioritized   Intention tremor     Has not improved with treatment of neurosyphilis    Abnormality of gait     Continue aggressive PT    Urinary retention     Voiding trial today--will replace foley, if unsuccessful    Neurosyphilis - Primary     Now fully treated--f/u per ID       Pt. Did not urinate in the office but  requested we keep his foley out.  We will give him a trial with it out and he can return in the morning if no results for re-insertion.  Return in about 4 weeks (around 09/14/2013).

## 2013-08-17 NOTE — Assessment & Plan Note (Signed)
Now fully treated--f/u per ID

## 2013-08-17 NOTE — Assessment & Plan Note (Signed)
Have held his atenolol due to low BP (probably related to Flomax).  May need to restart if tachycardia resumes.

## 2013-08-17 NOTE — Assessment & Plan Note (Signed)
Voiding trial today--will replace foley, if unsuccessful

## 2013-08-17 NOTE — Patient Instructions (Signed)
Acute Urinary Retention  Acute urinary retention is when you are unable to pee (urinate). Acute urinary retention is common in older men. Prostates can get bigger, which blocks the flow of pee.   HOME CARE  · Drink enough fluids to keep your pee clear or pale yellow.  · If you are sent home with a tube that drains the bladder (catheter), there will be a drainage bag attached to it. There are two types of bags. One is big that you can wear at night without having to empty it. One is smaller and needs to be emptied more often.  ¨ Keep the drainage bag empty.  ¨ Keep the drainage bag lower than your catheter.  · Only take medicine as told by your doctor.  GET HELP IF:  · You have a low-grade fever.  · You have spasms or you are leaking pee when you have spasms.  GET HELP RIGHT AWAY IF:   · You have chills or a fever.  · Your catheter stops draining pee.  · Your catheter falls out.  · You have increased bleeding that does not stop after you have rested and increased the amount of fluids you had been drinking.  MAKE SURE YOU:   · Understand these instructions.  · Will watch your condition.  · Will get help right away if you are not doing well or get worse.  Document Released: 08/05/2007 Document Revised: 12/07/2012 Document Reviewed: 07/28/2012  ExitCare® Patient Information ©2015 [employer]. This information is not intended to replace advice given to you by your health care provider. Make sure you discuss any questions you have with your health care provider.

## 2013-08-17 NOTE — Assessment & Plan Note (Signed)
Continue aggressive PT °

## 2013-08-18 ENCOUNTER — Telehealth: Payer: Self-pay | Admitting: *Deleted

## 2013-08-18 DIAGNOSIS — G252 Other specified forms of tremor: Secondary | ICD-10-CM | POA: Diagnosis not present

## 2013-08-18 DIAGNOSIS — R269 Unspecified abnormalities of gait and mobility: Secondary | ICD-10-CM | POA: Diagnosis not present

## 2013-08-18 DIAGNOSIS — G250 Essential tremor: Secondary | ICD-10-CM | POA: Diagnosis not present

## 2013-08-18 DIAGNOSIS — B20 Human immunodeficiency virus [HIV] disease: Secondary | ICD-10-CM | POA: Diagnosis not present

## 2013-08-18 DIAGNOSIS — M6281 Muscle weakness (generalized): Secondary | ICD-10-CM | POA: Diagnosis not present

## 2013-08-18 DIAGNOSIS — A523 Neurosyphilis, unspecified: Secondary | ICD-10-CM | POA: Diagnosis not present

## 2013-08-18 DIAGNOSIS — H8110 Benign paroxysmal vertigo, unspecified ear: Secondary | ICD-10-CM | POA: Diagnosis not present

## 2013-08-18 NOTE — Telephone Encounter (Signed)
Spoke with caregiver, patient has voided at least 5 times since last night and has actually been getting up and going to the bathroom himself. FYI to PCP. Caregiver wants to know when patient should follow up with PCP.

## 2013-08-21 DIAGNOSIS — G250 Essential tremor: Secondary | ICD-10-CM | POA: Diagnosis not present

## 2013-08-21 DIAGNOSIS — B20 Human immunodeficiency virus [HIV] disease: Secondary | ICD-10-CM | POA: Diagnosis not present

## 2013-08-21 DIAGNOSIS — M6281 Muscle weakness (generalized): Secondary | ICD-10-CM | POA: Diagnosis not present

## 2013-08-21 DIAGNOSIS — R269 Unspecified abnormalities of gait and mobility: Secondary | ICD-10-CM | POA: Diagnosis not present

## 2013-08-21 DIAGNOSIS — A523 Neurosyphilis, unspecified: Secondary | ICD-10-CM | POA: Diagnosis not present

## 2013-08-21 DIAGNOSIS — H8110 Benign paroxysmal vertigo, unspecified ear: Secondary | ICD-10-CM | POA: Diagnosis not present

## 2013-08-22 ENCOUNTER — Telehealth: Payer: Self-pay | Admitting: *Deleted

## 2013-08-22 ENCOUNTER — Other Ambulatory Visit: Payer: Medicare Other

## 2013-08-22 DIAGNOSIS — R269 Unspecified abnormalities of gait and mobility: Secondary | ICD-10-CM | POA: Diagnosis not present

## 2013-08-22 DIAGNOSIS — H8110 Benign paroxysmal vertigo, unspecified ear: Secondary | ICD-10-CM | POA: Diagnosis not present

## 2013-08-22 DIAGNOSIS — A523 Neurosyphilis, unspecified: Secondary | ICD-10-CM | POA: Diagnosis not present

## 2013-08-22 DIAGNOSIS — B20 Human immunodeficiency virus [HIV] disease: Secondary | ICD-10-CM

## 2013-08-22 DIAGNOSIS — M6281 Muscle weakness (generalized): Secondary | ICD-10-CM | POA: Diagnosis not present

## 2013-08-22 DIAGNOSIS — Z21 Asymptomatic human immunodeficiency virus [HIV] infection status: Secondary | ICD-10-CM | POA: Diagnosis not present

## 2013-08-22 DIAGNOSIS — G250 Essential tremor: Secondary | ICD-10-CM | POA: Diagnosis not present

## 2013-08-22 LAB — CBC WITH DIFFERENTIAL/PLATELET
BASOS ABS: 0 10*3/uL (ref 0.0–0.1)
BASOS PCT: 0 % (ref 0–1)
Eosinophils Absolute: 1.2 10*3/uL — ABNORMAL HIGH (ref 0.0–0.7)
Eosinophils Relative: 17 % — ABNORMAL HIGH (ref 0–5)
HCT: 35.3 % — ABNORMAL LOW (ref 39.0–52.0)
HEMOGLOBIN: 11.6 g/dL — AB (ref 13.0–17.0)
Lymphocytes Relative: 20 % (ref 12–46)
Lymphs Abs: 1.5 10*3/uL (ref 0.7–4.0)
MCH: 29.5 pg (ref 26.0–34.0)
MCHC: 32.9 g/dL (ref 30.0–36.0)
MCV: 89.8 fL (ref 78.0–100.0)
Monocytes Absolute: 0.7 10*3/uL (ref 0.1–1.0)
Monocytes Relative: 9 % (ref 3–12)
NEUTROS ABS: 3.9 10*3/uL (ref 1.7–7.7)
NEUTROS PCT: 54 % (ref 43–77)
PLATELETS: 330 10*3/uL (ref 150–400)
RBC: 3.93 MIL/uL — ABNORMAL LOW (ref 4.22–5.81)
RDW: 14.1 % (ref 11.5–15.5)
WBC: 7.3 10*3/uL (ref 4.0–10.5)

## 2013-08-22 LAB — COMPLETE METABOLIC PANEL WITH GFR
ALK PHOS: 80 U/L (ref 39–117)
ALT: 14 U/L (ref 0–53)
AST: 14 U/L (ref 0–37)
Albumin: 3.2 g/dL — ABNORMAL LOW (ref 3.5–5.2)
BILIRUBIN TOTAL: 0.3 mg/dL (ref 0.2–1.2)
BUN: 18 mg/dL (ref 6–23)
CO2: 30 mEq/L (ref 19–32)
Calcium: 9 mg/dL (ref 8.4–10.5)
Chloride: 106 mEq/L (ref 96–112)
Creat: 1.09 mg/dL (ref 0.50–1.35)
GFR, Est African American: 86 mL/min
GFR, Est Non African American: 74 mL/min
Glucose, Bld: 111 mg/dL — ABNORMAL HIGH (ref 70–99)
Potassium: 4.6 mEq/L (ref 3.5–5.3)
SODIUM: 140 meq/L (ref 135–145)
TOTAL PROTEIN: 7.1 g/dL (ref 6.0–8.3)

## 2013-08-22 NOTE — Telephone Encounter (Signed)
Needs verbal orders to continue occupational therapy.  Will forward to MD for OK. [name], [name]

## 2013-08-23 DIAGNOSIS — B20 Human immunodeficiency virus [HIV] disease: Secondary | ICD-10-CM | POA: Diagnosis not present

## 2013-08-23 DIAGNOSIS — G250 Essential tremor: Secondary | ICD-10-CM | POA: Diagnosis not present

## 2013-08-23 DIAGNOSIS — M6281 Muscle weakness (generalized): Secondary | ICD-10-CM | POA: Diagnosis not present

## 2013-08-23 DIAGNOSIS — A523 Neurosyphilis, unspecified: Secondary | ICD-10-CM | POA: Diagnosis not present

## 2013-08-23 DIAGNOSIS — G252 Other specified forms of tremor: Secondary | ICD-10-CM | POA: Diagnosis not present

## 2013-08-23 DIAGNOSIS — H8110 Benign paroxysmal vertigo, unspecified ear: Secondary | ICD-10-CM | POA: Diagnosis not present

## 2013-08-23 DIAGNOSIS — R269 Unspecified abnormalities of gait and mobility: Secondary | ICD-10-CM | POA: Diagnosis not present

## 2013-08-23 LAB — T-HELPER CELL (CD4) - (RCID CLINIC ONLY)
CD4 % Helper T Cell: 17 % — ABNORMAL LOW (ref 33–55)
CD4 T CELL ABS: 270 /uL — AB (ref 400–2700)

## 2013-08-23 LAB — HIV-1 RNA QUANT-NO REFLEX-BLD
HIV 1 RNA Quant: 575 copies/mL — ABNORMAL HIGH (ref <20)
HIV-1 RNA QUANT, LOG: 2.76 {Log} — AB (ref <1.30)

## 2013-08-23 NOTE — Telephone Encounter (Signed)
Shaun Lutz informed. Shaun Lutz, Shaun Lutz

## 2013-08-23 NOTE — Telephone Encounter (Signed)
Ok to continue occupational therapy

## 2013-08-23 NOTE — Telephone Encounter (Signed)
Spoke with caregiver, informed her that patient needs to follow up in about 1-2 months, she will callback to schedule.

## 2013-08-24 ENCOUNTER — Encounter: Payer: Self-pay | Admitting: Internal Medicine

## 2013-08-24 ENCOUNTER — Ambulatory Visit (INDEPENDENT_AMBULATORY_CARE_PROVIDER_SITE_OTHER): Payer: Medicare Other | Admitting: Internal Medicine

## 2013-08-24 VITALS — BP 137/88 | HR 77 | Ht 68.0 in | Wt 130.0 lb

## 2013-08-24 DIAGNOSIS — I441 Atrioventricular block, second degree: Secondary | ICD-10-CM | POA: Diagnosis not present

## 2013-08-24 DIAGNOSIS — M6281 Muscle weakness (generalized): Secondary | ICD-10-CM | POA: Diagnosis not present

## 2013-08-24 DIAGNOSIS — Z950 Presence of cardiac pacemaker: Secondary | ICD-10-CM

## 2013-08-24 DIAGNOSIS — B20 Human immunodeficiency virus [HIV] disease: Secondary | ICD-10-CM | POA: Diagnosis not present

## 2013-08-24 DIAGNOSIS — R269 Unspecified abnormalities of gait and mobility: Secondary | ICD-10-CM | POA: Diagnosis not present

## 2013-08-24 DIAGNOSIS — G250 Essential tremor: Secondary | ICD-10-CM | POA: Diagnosis not present

## 2013-08-24 DIAGNOSIS — H8110 Benign paroxysmal vertigo, unspecified ear: Secondary | ICD-10-CM | POA: Diagnosis not present

## 2013-08-24 DIAGNOSIS — A523 Neurosyphilis, unspecified: Secondary | ICD-10-CM | POA: Diagnosis not present

## 2013-08-24 LAB — MDC_IDC_ENUM_SESS_TYPE_INCLINIC
Battery Impedance: 203 Ohm
Battery Remaining Longevity: 131 mo
Battery Voltage: 2.79 V
Brady Statistic AP VP Percent: 0 %
Brady Statistic AS VP Percent: 0 %
Lead Channel Pacing Threshold Amplitude: 1 V
Lead Channel Sensing Intrinsic Amplitude: 2 mV
Lead Channel Sensing Intrinsic Amplitude: 8 mV
Lead Channel Setting Pacing Amplitude: 2 V
Lead Channel Setting Pacing Amplitude: 2.5 V
Lead Channel Setting Pacing Pulse Width: 0.4 ms
MDC IDC MSMT LEADCHNL RA IMPEDANCE VALUE: 403 Ohm
MDC IDC MSMT LEADCHNL RA PACING THRESHOLD AMPLITUDE: 0.75 V
MDC IDC MSMT LEADCHNL RA PACING THRESHOLD PULSEWIDTH: 0.4 ms
MDC IDC MSMT LEADCHNL RV IMPEDANCE VALUE: 482 Ohm
MDC IDC MSMT LEADCHNL RV PACING THRESHOLD PULSEWIDTH: 0.4 ms
MDC IDC SESS DTM: 20150625123913
MDC IDC SET LEADCHNL RV SENSING SENSITIVITY: 2.8 mV
MDC IDC STAT BRADY AP VS PERCENT: 23 %
MDC IDC STAT BRADY AS VS PERCENT: 77 %

## 2013-08-24 NOTE — Addendum Note (Signed)
Addended by: Deboraha Sprang on: 08/24/2013 09:34 AM   Modules accepted: Level of Service

## 2013-08-24 NOTE — Progress Notes (Addendum)
      Patient Care Team: Donnamae Jude, MD as PCP - General (Obstetrics and Gynecology)   HPI  Shaun Lutz is a 58 y.o. male Seen In followup for a pacemaker implanted 1/12 because of syncope and second degree heart block.it was done by Dr. Loletha Grayer.; however, he lost his insurance and was no longer able to be followed at [facility]   Cardiac evaluation at that time had demonstrated normal left ventricular function by echo.  The patient denies chest pain, shortness of breath, nocturnal dyspnea, orthopnea or peripheral edema.  There have been no palpitations, lightheadedness or syncope.         Past Medical History  Diagnosis Date  . Hypertension   . Second degree AV block   . Syncope   . Pacemaker -Medtronic     DOI 2012  . SYNCOPE 04/07/2010  . HIV (human immunodeficiency virus infection)   . Neurosyphilis   . HIV dementia     Past Surgical History  Procedure Laterality Date  . Pacemaker insertion      Current Outpatient Prescriptions  Medication Sig Dispense Refill  . aspirin 81 MG tablet Take 1 tablet (81 mg total) by mouth daily.  180 tablet  2  . atenolol (TENORMIN) 25 MG tablet Take 1 tablet (25 mg total) by mouth daily.  30 tablet  1  . dolutegravir (TIVICAY) 50 MG tablet Take 1 tablet (50 mg total) by mouth daily.  30 tablet  11  . emtricitabine-tenofovir (TRUVADA) 200-300 MG per tablet Take 1 tablet by mouth daily.  30 tablet  11  . tamsulosin (FLOMAX) 0.4 MG CAPS capsule Take 1 capsule (0.4 mg total) by mouth daily after supper.  30 capsule  1   No current facility-administered medications for this visit.    Allergies  Allergen Reactions  . Penicillins     Review of Systems negative except from HPI and PMH  Physical Exam BP 137/88  Pulse 77  Ht 5\' 8"  (1.727 m)  Wt 130 lb (58.968 kg)  BMI 19.77 kg/m2 Well developed and well nourished in no acute distress HENT normal E scleral and icterus clear Neck Supple Clear to ausculation Device pocket well  healed; without hematoma or erythema.  There is no tethering   Regular rate and rhythm, no murmurs gallops or rub Soft with active bowel sounds No clubbing cyanosis  Edema Alert and oriented, walks with a walker because of balance Skin Warm and Dry  ECG demonstrates sinus rhythm at 77 Intervals 16/08/30 Axis IV Otherwise normal   Assessment and plan  Second degree AV block-intermittent   Syncope  Ventricular tachycardia  Pacemaker-Medtronic  The patient's device was interrogated.  The information was reviewed. No changes were made in the programming.    Without symptoms. No recurrent syncope. Continue current medications.   nonsustained ventricular tachycardia was identified on his device; is without symptoms. Will follow

## 2013-08-24 NOTE — Patient Instructions (Signed)
Your physician wants you to follow-up in: 6 months with device clinic and 12 months with Dr Klein.  You will receive a reminder letter in the mail two months in advance. If you don't receive a letter, please call our office to schedule the follow-up appointment.  

## 2013-08-28 ENCOUNTER — Telehealth: Payer: Self-pay | Admitting: Family Medicine

## 2013-08-28 DIAGNOSIS — B20 Human immunodeficiency virus [HIV] disease: Secondary | ICD-10-CM | POA: Diagnosis not present

## 2013-08-28 DIAGNOSIS — G250 Essential tremor: Secondary | ICD-10-CM | POA: Diagnosis not present

## 2013-08-28 DIAGNOSIS — M6281 Muscle weakness (generalized): Secondary | ICD-10-CM | POA: Diagnosis not present

## 2013-08-28 DIAGNOSIS — R269 Unspecified abnormalities of gait and mobility: Secondary | ICD-10-CM | POA: Diagnosis not present

## 2013-08-28 DIAGNOSIS — H8110 Benign paroxysmal vertigo, unspecified ear: Secondary | ICD-10-CM | POA: Diagnosis not present

## 2013-08-28 DIAGNOSIS — A523 Neurosyphilis, unspecified: Secondary | ICD-10-CM | POA: Diagnosis not present

## 2013-08-28 NOTE — Telephone Encounter (Signed)
Wife called and wanted Dr. Kennon Rounds to know that the Flomax that he is taking was causing him to use the bathroom on himself. She is no longer going to give him this medication. She would like Dr. Kennon Rounds to call her and advise her. jw

## 2013-08-29 DIAGNOSIS — B20 Human immunodeficiency virus [HIV] disease: Secondary | ICD-10-CM | POA: Diagnosis not present

## 2013-08-29 DIAGNOSIS — H8110 Benign paroxysmal vertigo, unspecified ear: Secondary | ICD-10-CM | POA: Diagnosis not present

## 2013-08-29 DIAGNOSIS — A523 Neurosyphilis, unspecified: Secondary | ICD-10-CM | POA: Diagnosis not present

## 2013-08-29 DIAGNOSIS — M6281 Muscle weakness (generalized): Secondary | ICD-10-CM | POA: Diagnosis not present

## 2013-08-29 DIAGNOSIS — R269 Unspecified abnormalities of gait and mobility: Secondary | ICD-10-CM | POA: Diagnosis not present

## 2013-08-29 DIAGNOSIS — G250 Essential tremor: Secondary | ICD-10-CM | POA: Diagnosis not present

## 2013-08-29 NOTE — Telephone Encounter (Signed)
Pts girlfriend informed.  They do not have a machine at home to check BP.  Mentioned that this would be a good investment, but Shaun Lutz states that they "cant do that at this time". [name], [name]

## 2013-08-29 NOTE — Telephone Encounter (Signed)
Let's continue to hold that one and see how BP does.

## 2013-08-29 NOTE — Telephone Encounter (Signed)
Spoke with pts girlfriend and informed of the below.  Appt made for 09/06/13.     Pts girlfriend states that pt stopped the Lisinopril NOT atenolol.  Will forward to MD for clarification before restarting the medication. [name], [name]

## 2013-08-29 NOTE — Telephone Encounter (Signed)
Have him restart his atenolol and have them schedule f/u.

## 2013-08-30 ENCOUNTER — Ambulatory Visit: Payer: Self-pay | Admitting: Infectious Disease

## 2013-08-30 DIAGNOSIS — H8110 Benign paroxysmal vertigo, unspecified ear: Secondary | ICD-10-CM | POA: Diagnosis not present

## 2013-08-30 DIAGNOSIS — R269 Unspecified abnormalities of gait and mobility: Secondary | ICD-10-CM | POA: Diagnosis not present

## 2013-08-30 DIAGNOSIS — G252 Other specified forms of tremor: Secondary | ICD-10-CM | POA: Diagnosis not present

## 2013-08-30 DIAGNOSIS — B20 Human immunodeficiency virus [HIV] disease: Secondary | ICD-10-CM | POA: Diagnosis not present

## 2013-08-30 DIAGNOSIS — G250 Essential tremor: Secondary | ICD-10-CM | POA: Diagnosis not present

## 2013-08-30 DIAGNOSIS — A523 Neurosyphilis, unspecified: Secondary | ICD-10-CM | POA: Diagnosis not present

## 2013-08-30 DIAGNOSIS — M6281 Muscle weakness (generalized): Secondary | ICD-10-CM | POA: Diagnosis not present

## 2013-08-31 ENCOUNTER — Ambulatory Visit (INDEPENDENT_AMBULATORY_CARE_PROVIDER_SITE_OTHER): Payer: Medicare Other | Admitting: Infectious Disease

## 2013-08-31 ENCOUNTER — Encounter: Payer: Self-pay | Admitting: Infectious Disease

## 2013-08-31 VITALS — BP 115/82 | HR 80 | Temp 97.6°F | Wt 126.0 lb

## 2013-08-31 DIAGNOSIS — F0280 Dementia in other diseases classified elsewhere without behavioral disturbance: Secondary | ICD-10-CM | POA: Diagnosis not present

## 2013-08-31 DIAGNOSIS — R269 Unspecified abnormalities of gait and mobility: Secondary | ICD-10-CM | POA: Diagnosis not present

## 2013-08-31 DIAGNOSIS — A523 Neurosyphilis, unspecified: Secondary | ICD-10-CM | POA: Diagnosis not present

## 2013-08-31 DIAGNOSIS — B20 Human immunodeficiency virus [HIV] disease: Secondary | ICD-10-CM

## 2013-08-31 DIAGNOSIS — Z79899 Other long term (current) drug therapy: Secondary | ICD-10-CM

## 2013-08-31 DIAGNOSIS — H8110 Benign paroxysmal vertigo, unspecified ear: Secondary | ICD-10-CM | POA: Diagnosis not present

## 2013-08-31 DIAGNOSIS — Z21 Asymptomatic human immunodeficiency virus [HIV] infection status: Secondary | ICD-10-CM | POA: Diagnosis not present

## 2013-08-31 DIAGNOSIS — M6281 Muscle weakness (generalized): Secondary | ICD-10-CM | POA: Diagnosis not present

## 2013-08-31 DIAGNOSIS — G250 Essential tremor: Secondary | ICD-10-CM | POA: Diagnosis not present

## 2013-08-31 DIAGNOSIS — G252 Other specified forms of tremor: Secondary | ICD-10-CM | POA: Diagnosis not present

## 2013-08-31 MED ORDER — EMTRICITABINE-TENOFOVIR DF 200-300 MG PO TABS
1.0000 | ORAL_TABLET | Freq: Every day | ORAL | Status: DC
Start: 2013-08-31 — End: 2014-04-05

## 2013-08-31 MED ORDER — DOLUTEGRAVIR SODIUM 50 MG PO TABS: 50.0000 mg | ORAL_TABLET | Freq: Every day | ORAL | Status: DC

## 2013-08-31 NOTE — Progress Notes (Signed)
   Subjective:    Patient ID: Shaun Lutz, male    DOB: 10-25-1955, 58 y.o.   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 He received 13 days of IV therapy.  He had what was thought to be PCN rash and changed to rocephin last 2 days of therapy before pulling out his IV  He was placed on Tivicay and Truvada and was taking these for the near month he spent in inpatient hospital. Unfortunately apparently NO ONE properly investigated the fact that he had NO COVERAGE for ARV meds with his medicare plan prior to DC and understandably he could not afford the $3k his ARVS cost I was VERY upset about this. We brought in for urgent SPAP application. I tried to find spare bottles of ARVs to give him in the interim but we had no complete regimens.   He was enrolled into ADAP and has been on TIvicay and Truvada and VL has come down in a month from nearly 200K to 596 copies. CD4 holding steady in upper 200s  He still does not remember that he has HIV or syphilis. HIs male friend is ensuring that he takes his medicines faithfully. He will meet with counselor to process the September SPAP renewal today.   Review of Systems  Unable to perform ROS Skin: Positive for rash.  Psychiatric/Behavioral: Negative for sleep disturbance.       Objective:   Physical Exam  Vitals reviewed. Constitutional: He appears well-nourished. No distress.  HENT:  Head: Normocephalic and atraumatic.  Eyes: Conjunctivae and EOM are normal. No scleral icterus.  Neck: Normal range of motion. Neck supple.  Cardiovascular: Normal rate, regular rhythm and normal heart sounds.  Exam reveals no gallop and no friction rub.   No murmur heard. Pulmonary/Chest: Effort normal and breath sounds normal. No respiratory distress. He has no wheezes.  Abdominal: He exhibits no distension. There is no tenderness.  Musculoskeletal: He  exhibits no edema and no tenderness.  Neurological: He is alert.  Skin: Skin is warm and dry. Rash noted. He is not diaphoretic.  Psychiatric: His behavior is normal. His mood appears anxious. His speech is delayed. Cognition and memory are impaired. He exhibits abnormal recent memory.   He has vitiligo like skin changes       Assessment & Plan:   HIV: recheck VL today if <200 then bring back in 3-4 months time for recheck VL and CD4> SPAP paperwork filled out. I spent  25 minutes with the patient including greater than 50% of time in face to face counsel of the patient and in coordination of their care.   Neurosyphilis: will need to follow titers and consider repeat LP. Given his apparent rash to PCN he would need desensitization for repeat treatment. Check RPR at 6 month point  Dementia, combination of Neurosyphilis +/- HIV dementia: not going to recover his antegrade memory it seems. Will need extensive support from his friend  and caregiver

## 2013-08-31 NOTE — Patient Instructions (Signed)
YOU ARE DOING A FANTASTIC JOB TAKING YOUR MEDICINES  WE WILL CHECK SOME BLOODWORK TODAY  IF THAT IS ENCOURAGING WE WILL HAVE YOU COME BACK IN October FOR REPEAT LABS AND VISIT SHORTLY THEREAFTER  IT IS CRITICAL THAT YOU RENEW YOUR ADAP APPLICATION TODAY

## 2013-09-01 LAB — HIV-1 RNA QUANT-NO REFLEX-BLD
HIV 1 RNA Quant: 229 copies/mL — ABNORMAL HIGH (ref <20)
HIV-1 RNA QUANT, LOG: 2.36 {Log} — AB (ref <1.30)

## 2013-09-04 ENCOUNTER — Telehealth: Payer: Self-pay | Admitting: *Deleted

## 2013-09-04 DIAGNOSIS — A523 Neurosyphilis, unspecified: Secondary | ICD-10-CM | POA: Diagnosis not present

## 2013-09-04 DIAGNOSIS — M6281 Muscle weakness (generalized): Secondary | ICD-10-CM | POA: Diagnosis not present

## 2013-09-04 DIAGNOSIS — R269 Unspecified abnormalities of gait and mobility: Secondary | ICD-10-CM | POA: Diagnosis not present

## 2013-09-04 DIAGNOSIS — B20 Human immunodeficiency virus [HIV] disease: Secondary | ICD-10-CM | POA: Diagnosis not present

## 2013-09-04 DIAGNOSIS — G250 Essential tremor: Secondary | ICD-10-CM | POA: Diagnosis not present

## 2013-09-04 DIAGNOSIS — H8110 Benign paroxysmal vertigo, unspecified ear: Secondary | ICD-10-CM | POA: Diagnosis not present

## 2013-09-04 NOTE — Telephone Encounter (Signed)
Gave patient lab appointment 8/4 at 9:30 for repeat labs per KVD.  Orders placed CD4 and Viral Load only.  See note below: I want to bring Shaun Lutz back in august for one more viral load and CD4 check. He is just above "undetectable threshold" at present he has followup in October I think

## 2013-09-04 NOTE — Telephone Encounter (Signed)
Thanks Michelle

## 2013-09-05 ENCOUNTER — Other Ambulatory Visit: Payer: Self-pay | Admitting: Family Medicine

## 2013-09-05 ENCOUNTER — Other Ambulatory Visit: Payer: Self-pay | Admitting: *Deleted

## 2013-09-05 DIAGNOSIS — R269 Unspecified abnormalities of gait and mobility: Secondary | ICD-10-CM | POA: Diagnosis not present

## 2013-09-05 DIAGNOSIS — H8110 Benign paroxysmal vertigo, unspecified ear: Secondary | ICD-10-CM | POA: Diagnosis not present

## 2013-09-05 DIAGNOSIS — M6281 Muscle weakness (generalized): Secondary | ICD-10-CM | POA: Diagnosis not present

## 2013-09-05 DIAGNOSIS — G250 Essential tremor: Secondary | ICD-10-CM | POA: Diagnosis not present

## 2013-09-05 DIAGNOSIS — A523 Neurosyphilis, unspecified: Secondary | ICD-10-CM | POA: Diagnosis not present

## 2013-09-05 DIAGNOSIS — B20 Human immunodeficiency virus [HIV] disease: Secondary | ICD-10-CM | POA: Diagnosis not present

## 2013-09-05 MED ORDER — ATENOLOL 25 MG PO TABS: 25.0000 mg | ORAL_TABLET | Freq: Every day | ORAL | Status: DC

## 2013-09-05 MED ORDER — ATENOLOL 25 MG PO TABS
25.0000 mg | ORAL_TABLET | Freq: Every day | ORAL | Status: DC
Start: 2013-09-05 — End: 2014-01-14

## 2013-09-06 ENCOUNTER — Ambulatory Visit (INDEPENDENT_AMBULATORY_CARE_PROVIDER_SITE_OTHER): Payer: Medicare Other | Admitting: Family Medicine

## 2013-09-06 ENCOUNTER — Encounter: Payer: Self-pay | Admitting: Family Medicine

## 2013-09-06 VITALS — BP 107/74 | HR 80 | Temp 98.1°F | Wt 124.0 lb

## 2013-09-06 DIAGNOSIS — I441 Atrioventricular block, second degree: Secondary | ICD-10-CM | POA: Diagnosis not present

## 2013-09-06 DIAGNOSIS — G252 Other specified forms of tremor: Secondary | ICD-10-CM

## 2013-09-06 DIAGNOSIS — G250 Essential tremor: Secondary | ICD-10-CM

## 2013-09-06 DIAGNOSIS — R3581 Nocturnal polyuria: Secondary | ICD-10-CM

## 2013-09-06 DIAGNOSIS — R351 Nocturia: Secondary | ICD-10-CM | POA: Diagnosis not present

## 2013-09-06 DIAGNOSIS — R5381 Other malaise: Secondary | ICD-10-CM

## 2013-09-06 NOTE — Progress Notes (Signed)
    Subjective:    Patient ID: Shaun Lutz is a 58 y.o. male presenting with Urinary Frequency  on 09/06/2013  HPI: Doing well.  Has some urinary frequency. But pushing fluids. BP is stable. Off flomax and lisinopril. Seeing ID, on Trivicay and Truvada. Still getting therapies at home. Needs a dentist and to see eye doctor.  Review of Systems  Constitutional: Negative for fever and chills.  Respiratory: Negative for shortness of breath.   Cardiovascular: Negative for chest pain and leg swelling.  Genitourinary: Positive for frequency.      Objective:    BP 107/74  Pulse 80  Temp(Src) 98.1 F (36.7 C) (Oral)  Wt 124 lb (56.246 kg) Physical Exam  Constitutional:  Thin male, NAD, better dressed  Cardiovascular: Normal rate.   Abdominal: Soft.  Musculoskeletal: He exhibits no edema.  Neurological: He is alert.  Skin: Skin is warm.        Assessment & Plan:   Physical deconditioning Improving with therapies.  Graduates next week.  Intention tremor Improved with treatment of other medical conditions  Nocturnal polyuria Probably related to multiple things, including neurosyphilis and B-blockers, and age and some bladder dysfunction.  ATRIOVENTRICULAR BLOCK, MOBITZ TYPE II Continue his B-blocker and pacer.  BP is improved now only on one BP medication.    Return in about 4 weeks (around 10/04/2013).

## 2013-09-06 NOTE — Assessment & Plan Note (Signed)
Continue his B-blocker and pacer.  BP is improved now only on one BP medication.

## 2013-09-06 NOTE — Assessment & Plan Note (Signed)
Improved with treatment of other medical conditions

## 2013-09-06 NOTE — Patient Instructions (Signed)
Urinary Frequency The number of times a normal person urinates depends upon how much liquid they take in and how much liquid they are losing. If the temperature is hot and there is high humidity then the person will sweat more and usually breathe a little more frequently. These factors decrease the amount of frequency of urination that would be considered normal. The amount you drink is easily determined, but the amount of fluid lost is sometimes more difficult to calculate.  Fluid is lost in two ways:  Sensible fluid loss is usually measured by the amount of urine that you get rid of. Losses of fluid can also occur with diarrhea.  Insensible fluid loss is more difficult to measure. It is caused by evaporation. Insensible loss of fluid occurs through breathing and sweating. It usually ranges from a little less than a quart to a little more than a quart of fluid a day. In normal temperatures and activity levels the average person may urinate 4 to 7 times in a 24-hour period. Needing to urinate more often than that could indicate a problem. If one urinates 4 to 7 times in 24 hours and has large volumes each time, that could indicate a different problem from one who urinates 4 to 7 times a day and has small volumes. The time of urinating is also an important. Most urinating should be done during the waking hours. Getting up at night to urinate frequently can indicate some problems. CAUSES  The bladder is the organ in your lower abdomen that holds urine. Like a balloon, it swells some as it fills up. Your nerves sense this and tell you it is time to head for the bathroom. There are a number of reasons that you might feel the need to urinate more often than usual. They include:  Urinary tract infection. This is usually associated with other signs such as burning when you urinate.  In men, problems with the prostate (a walnut-size gland that is located near the tube that carries urine out of your body).  There are two reasons why the prostate can cause an increased frequency of urination:  An enlarged prostate that does not let the bladder empty well. If the bladder only half empties when you urinate then it only has half the capacity to fill before you have to urinate again.  The nerves in the bladder become more hypersensitive with an increased size of the prostate even if the bladder empties completely.  Pregnancy.  Obesity. Excess weight is more likely to cause a problem for women more than for men.  Bladder stones or other bladder problems.  Caffeine.  Alcohol.  Medications. For example, drugs that help the body get rid of extra fluid (diuretics) increase urine production. Some other medicines must be taken with lots of fluids.  Muscle or nerve weakness. This might be the result of a spinal cord injury, a stroke, multiple sclerosis or Parkinson's disease.  Long-standing diabetes can decrease the sensation of the bladder. This loss of sensation makes it harder to sense the bladder needs to be emptied. Over a period of years the bladder is stretched out by constant overfilling. This weakens the bladder muscles so that the bladder does not empty well and has less capacity to fill with new urine.  Interstitial cystitis (also called painful bladder syndrome). This condition develops because the tissues that line the insider of the bladder are inflamed (inflammation is the body's way of reacting to injury or infection). It causes pain  and frequent urination. It occurs in women more often than in men. DIAGNOSIS   To decide what might be causing your urinary frequency, your healthcare provider will probably:  Ask about symptoms you have noticed.  Ask about your overall health. This will include questions about any medications you are taking.  Do a physical examination.  Order some tests. These might include:  A blood test to check for diabetes or other health issues that could be  contributing to the problem.  Urine testing. This could measure the flow of urine and the pressure on the bladder.  A test of your neurological system (the brain, spinal cord and nerves). This is the system that senses the need to urinate.  A bladder test to check whether it is emptying completely when you urinate.  Cytoscopy. This test uses a thin tube with a tiny camera on it. It offers a look inside your urethra and bladder to see if there are problems.  Imaging tests. You might be given a contrast dye and then asked to urinate. X-rays are taken to see how your bladder is working. TREATMENT  It is important for you to be evaluated to determine if the amount or frequency that you have is unusual or abnormal. If it is found to be abnormal the cause should be determined and this can usually be found out easily. Depending upon the cause treatment could include medication, stimulation of the nerves, or surgery. There are not too many things that you can do as an individual to change your urinary frequency. It is important that you balance the amount of fluid intake needed to compensate for your activity and the temperature. Medical problems will be diagnosed and taken care of by your physician. There is no particular bladder training such as Kegel's exercises that you can do to help urinary frequency. This is an exercise this is usually done for people who have leaking of urine when they laugh cough or sneeze. HOME CARE INSTRUCTIONS   Take any medications your healthcare provider prescribed or suggested. Follow the directions carefully.  Practice any lifestyle changes that are recommended. These might include:  Drinking less fluid or drinking at different times of the day. If you need to urinate often during the night, for example, you may need to stop drinking fluids early in the evening.  Cutting down on caffeine or alcohol. They both can make you need to urinate more often than normal.  Caffeine is found in coffee, tea and sodas.  Losing weight, if that is recommended.  Keep a journal or a log. You might be asked to record how much you drink and when and when you feel the need to urinate. This will also help evaluate how well the treatment provided by your physician is working. SEEK MEDICAL CARE IF:   Your need to urinate often gets worse.  You feel increased pain or irritation when you urinate.  You notice blood in your urine.  You have questions about any medications that your healthcare provider recommended.  You notice blood, pus or swelling at the site of any test or treatment procedure.  You develop a fever of more than 100.5 F (38.1 C). SEEK IMMEDIATE MEDICAL CARE IF:  You develop a fever of more than 102.0 F (38.9 C). Document Released: 12/13/2008 Document Revised: 05/11/2011 Document Reviewed: 12/13/2008 [facility] Patient Information 2015 [place], [place]. This information is not intended to replace advice given to you by your health care provider. Make sure you discuss  any questions you have with your health care provider.  

## 2013-09-06 NOTE — Assessment & Plan Note (Signed)
Improving with therapies.  Graduates next week.

## 2013-09-06 NOTE — Assessment & Plan Note (Signed)
Probably related to multiple things, including neurosyphilis and B-blockers, and age and some bladder dysfunction.

## 2013-09-08 DIAGNOSIS — H8110 Benign paroxysmal vertigo, unspecified ear: Secondary | ICD-10-CM | POA: Diagnosis not present

## 2013-09-08 DIAGNOSIS — G252 Other specified forms of tremor: Secondary | ICD-10-CM | POA: Diagnosis not present

## 2013-09-08 DIAGNOSIS — M6281 Muscle weakness (generalized): Secondary | ICD-10-CM | POA: Diagnosis not present

## 2013-09-08 DIAGNOSIS — R269 Unspecified abnormalities of gait and mobility: Secondary | ICD-10-CM | POA: Diagnosis not present

## 2013-09-08 DIAGNOSIS — A523 Neurosyphilis, unspecified: Secondary | ICD-10-CM | POA: Diagnosis not present

## 2013-09-08 DIAGNOSIS — B20 Human immunodeficiency virus [HIV] disease: Secondary | ICD-10-CM | POA: Diagnosis not present

## 2013-09-08 DIAGNOSIS — G250 Essential tremor: Secondary | ICD-10-CM | POA: Diagnosis not present

## 2013-09-11 DIAGNOSIS — H8110 Benign paroxysmal vertigo, unspecified ear: Secondary | ICD-10-CM | POA: Diagnosis not present

## 2013-09-11 DIAGNOSIS — R269 Unspecified abnormalities of gait and mobility: Secondary | ICD-10-CM | POA: Diagnosis not present

## 2013-09-11 DIAGNOSIS — A523 Neurosyphilis, unspecified: Secondary | ICD-10-CM | POA: Diagnosis not present

## 2013-09-11 DIAGNOSIS — B20 Human immunodeficiency virus [HIV] disease: Secondary | ICD-10-CM | POA: Diagnosis not present

## 2013-09-11 DIAGNOSIS — M6281 Muscle weakness (generalized): Secondary | ICD-10-CM | POA: Diagnosis not present

## 2013-09-11 DIAGNOSIS — G250 Essential tremor: Secondary | ICD-10-CM | POA: Diagnosis not present

## 2013-09-14 DIAGNOSIS — A523 Neurosyphilis, unspecified: Secondary | ICD-10-CM | POA: Diagnosis not present

## 2013-09-14 DIAGNOSIS — R269 Unspecified abnormalities of gait and mobility: Secondary | ICD-10-CM | POA: Diagnosis not present

## 2013-09-14 DIAGNOSIS — G250 Essential tremor: Secondary | ICD-10-CM | POA: Diagnosis not present

## 2013-09-14 DIAGNOSIS — H8110 Benign paroxysmal vertigo, unspecified ear: Secondary | ICD-10-CM | POA: Diagnosis not present

## 2013-09-14 DIAGNOSIS — M6281 Muscle weakness (generalized): Secondary | ICD-10-CM | POA: Diagnosis not present

## 2013-09-14 DIAGNOSIS — B20 Human immunodeficiency virus [HIV] disease: Secondary | ICD-10-CM | POA: Diagnosis not present

## 2013-10-03 ENCOUNTER — Other Ambulatory Visit: Payer: Medicare Other

## 2013-10-03 DIAGNOSIS — B20 Human immunodeficiency virus [HIV] disease: Secondary | ICD-10-CM

## 2013-10-04 LAB — T-HELPER CELL (CD4) - (RCID CLINIC ONLY)
CD4 T CELL ABS: 380 /uL — AB (ref 400–2700)
CD4 T CELL HELPER: 16 % — AB (ref 33–55)

## 2013-10-05 LAB — HIV-1 RNA QUANT-NO REFLEX-BLD
HIV 1 RNA Quant: <20 copies/mL (ref <20)
HIV-1 RNA Quant, Log: <1.3 {Log} (ref <1.30)

## 2013-10-18 ENCOUNTER — Encounter: Payer: Medicare Other | Attending: Physical Medicine & Rehabilitation | Admitting: Physical Medicine & Rehabilitation

## 2013-11-01 DIAGNOSIS — H2510 Age-related nuclear cataract, unspecified eye: Secondary | ICD-10-CM | POA: Diagnosis not present

## 2013-11-30 ENCOUNTER — Other Ambulatory Visit: Payer: Medicare Other

## 2013-11-30 DIAGNOSIS — B20 Human immunodeficiency virus [HIV] disease: Secondary | ICD-10-CM

## 2013-11-30 DIAGNOSIS — Z79899 Other long term (current) drug therapy: Secondary | ICD-10-CM | POA: Diagnosis not present

## 2013-11-30 DIAGNOSIS — A523 Neurosyphilis, unspecified: Secondary | ICD-10-CM

## 2013-11-30 DIAGNOSIS — Z21 Asymptomatic human immunodeficiency virus [HIV] infection status: Secondary | ICD-10-CM | POA: Diagnosis not present

## 2013-11-30 LAB — CBC WITH DIFFERENTIAL/PLATELET
Basophils Absolute: 0 10*3/uL (ref 0.0–0.1)
Basophils Relative: 0 % (ref 0–1)
EOS ABS: 0.8 10*3/uL — AB (ref 0.0–0.7)
Eosinophils Relative: 11 % — ABNORMAL HIGH (ref 0–5)
HCT: 38.7 % — ABNORMAL LOW (ref 39.0–52.0)
Hemoglobin: 13.1 g/dL (ref 13.0–17.0)
Lymphocytes Relative: 26 % (ref 12–46)
Lymphs Abs: 1.9 10*3/uL (ref 0.7–4.0)
MCH: 30.9 pg (ref 26.0–34.0)
MCHC: 33.9 g/dL (ref 30.0–36.0)
MCV: 91.3 fL (ref 78.0–100.0)
MONOS PCT: 8 % (ref 3–12)
Monocytes Absolute: 0.6 10*3/uL (ref 0.1–1.0)
NEUTROS PCT: 55 % (ref 43–77)
Neutro Abs: 4.1 10*3/uL (ref 1.7–7.7)
PLATELETS: 284 10*3/uL (ref 150–400)
RBC: 4.24 MIL/uL (ref 4.22–5.81)
RDW: 14.5 % (ref 11.5–15.5)
WBC: 7.4 10*3/uL (ref 4.0–10.5)

## 2013-11-30 LAB — COMPLETE METABOLIC PANEL WITH GFR
ALK PHOS: 82 U/L (ref 39–117)
ALT: 10 U/L (ref 0–53)
AST: 14 U/L (ref 0–37)
Albumin: 3.7 g/dL (ref 3.5–5.2)
BILIRUBIN TOTAL: 0.4 mg/dL (ref 0.2–1.2)
BUN: 13 mg/dL (ref 6–23)
CO2: 24 mEq/L (ref 19–32)
CREATININE: 1.15 mg/dL (ref 0.50–1.35)
Calcium: 9.1 mg/dL (ref 8.4–10.5)
Chloride: 105 mEq/L (ref 96–112)
GFR, Est African American: 81 mL/min
GFR, Est Non African American: 70 mL/min
Glucose, Bld: 89 mg/dL (ref 70–99)
Potassium: 4.5 mEq/L (ref 3.5–5.3)
Sodium: 140 mEq/L (ref 135–145)
Total Protein: 7.4 g/dL (ref 6.0–8.3)

## 2013-11-30 LAB — LIPID PANEL
CHOL/HDL RATIO: 4.3 ratio
Cholesterol: 132 mg/dL (ref 0–200)
HDL: 31 mg/dL — ABNORMAL LOW (ref >39)
LDL Cholesterol: 81 mg/dL (ref 0–99)
Triglycerides: 99 mg/dL (ref <150)
VLDL: 20 mg/dL (ref 0–40)

## 2013-12-01 LAB — RPR TITER: RPR Titer: 1:512 {titer} — AB

## 2013-12-01 LAB — RPR: RPR Ser Ql: REACTIVE — AB

## 2013-12-01 LAB — FLUORESCENT TREPONEMAL AB(FTA)-IGG-BLD: Fluorescent Treponemal ABS: REACTIVE — AB

## 2013-12-01 LAB — HIV-1 RNA QUANT-NO REFLEX-BLD: HIV-1 RNA Quant, Log: <1.3 {Log} (ref <1.30)

## 2013-12-01 LAB — T-HELPER CELL (CD4) - (RCID CLINIC ONLY)
CD4 T CELL ABS: 330 /uL — AB (ref 400–2700)
CD4 T CELL HELPER: 18 % — AB (ref 33–55)

## 2013-12-04 ENCOUNTER — Telehealth: Payer: Self-pay | Admitting: Infectious Disease

## 2013-12-04 ENCOUNTER — Other Ambulatory Visit: Payer: Self-pay | Admitting: Licensed Clinical Social Worker

## 2013-12-04 DIAGNOSIS — A523 Neurosyphilis, unspecified: Secondary | ICD-10-CM

## 2013-12-04 DIAGNOSIS — G252 Other specified forms of tremor: Secondary | ICD-10-CM

## 2013-12-04 DIAGNOSIS — A539 Syphilis, unspecified: Secondary | ICD-10-CM

## 2013-12-04 NOTE — Addendum Note (Signed)
Addended by: Jarrett Ables D on: 12/04/2013 03:14 PM   Modules accepted: Orders

## 2013-12-04 NOTE — Telephone Encounter (Signed)
Shaun Lutz's RPR did not come down appropriately after treatment for neurosyphilis  He will need an LP by IR   For   Cell count and diff  Protein and glucose  CSF VDRL  CSF FTA-Abs

## 2013-12-05 NOTE — Telephone Encounter (Signed)
Thanks Shaun Lutz 

## 2013-12-05 NOTE — Telephone Encounter (Signed)
This has been set up by Tamika for 12/11/13 and patient is aware.

## 2013-12-07 ENCOUNTER — Encounter (HOSPITAL_COMMUNITY): Payer: Self-pay | Admitting: Pharmacy Technician

## 2013-12-08 ENCOUNTER — Ambulatory Visit (HOSPITAL_COMMUNITY): Payer: Medicare Other

## 2013-12-11 ENCOUNTER — Ambulatory Visit (HOSPITAL_COMMUNITY)
Admission: RE | Admit: 2013-12-11 | Discharge: 2013-12-11 | Disposition: A | Discharge status: Home / Self Care | Payer: Medicare Other | Source: Ambulatory Visit | Attending: Infectious Disease | Admitting: Infectious Disease

## 2013-12-11 DIAGNOSIS — Z950 Presence of cardiac pacemaker: Secondary | ICD-10-CM | POA: Insufficient documentation

## 2013-12-11 DIAGNOSIS — A523 Neurosyphilis, unspecified: Secondary | ICD-10-CM | POA: Diagnosis not present

## 2013-12-11 DIAGNOSIS — B20 Human immunodeficiency virus [HIV] disease: Secondary | ICD-10-CM | POA: Diagnosis not present

## 2013-12-11 DIAGNOSIS — F0390 Unspecified dementia without behavioral disturbance: Secondary | ICD-10-CM | POA: Diagnosis not present

## 2013-12-11 LAB — CSF CELL COUNT WITH DIFFERENTIAL
RBC Count, CSF: 7 /mm3 — ABNORMAL HIGH
Tube #: 4
WBC, CSF: 1 /mm3 (ref 0–5)

## 2013-12-11 LAB — PROTEIN, CSF: Total  Protein, CSF: 92 mg/dL — ABNORMAL HIGH (ref 15–45)

## 2013-12-11 LAB — GLUCOSE, CSF: Glucose, CSF: 44 mg/dL (ref 43–76)

## 2013-12-13 ENCOUNTER — Ambulatory Visit (INDEPENDENT_AMBULATORY_CARE_PROVIDER_SITE_OTHER): Payer: Medicare Other | Admitting: Infectious Disease

## 2013-12-13 ENCOUNTER — Encounter: Payer: Self-pay | Admitting: Infectious Disease

## 2013-12-13 VITALS — BP 137/92 | HR 76 | Temp 97.6°F | Wt 126.0 lb

## 2013-12-13 DIAGNOSIS — R000 Tachycardia, unspecified: Secondary | ICD-10-CM | POA: Diagnosis not present

## 2013-12-13 DIAGNOSIS — B20 Human immunodeficiency virus [HIV] disease: Secondary | ICD-10-CM | POA: Diagnosis not present

## 2013-12-13 DIAGNOSIS — A523 Neurosyphilis, unspecified: Secondary | ICD-10-CM

## 2013-12-13 DIAGNOSIS — F0280 Dementia in other diseases classified elsewhere without behavioral disturbance: Secondary | ICD-10-CM

## 2013-12-13 LAB — VDRL, CSF

## 2013-12-13 NOTE — Progress Notes (Signed)
   Subjective:    Patient ID: Shaun Lutz, male    DOB: [birth year], 58 y.o.   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 He received 13 days of IV therapy.  He had what was thought to be PCN rash and changed to rocephin last 2 days of therapy before pulling out his IV  We were eventually able to get him onto [facility] and Truvada through emergency SPAP  His viral load is nondetectable his CD4 count remains above 300  Lab Results  Component Value Date   HIV1RNAQUANT <20 11/30/2013   Lab Results  Component Value Date   CD4TABS 330* 11/30/2013   CD4TABS 380* 10/03/2013   CD4TABS 270* 08/22/2013   His RPR failed to drop over the last 6 months remaining at 1 512. We had a lumbar puncture performed earlier this week and CSF profile had an elevated protein but normal white blood cell count one white blood cell count on spinal fluid. CSF VDRL and CSF FTA antibodies are pending  He still does not remember that he has HIV or syphilis.  Upon hearing again he again voiced idea that he needed to commit suicide. We will talk him down and contract for safety.   Review of Systems  Unable to perform ROS Skin: Positive for rash.  Psychiatric/Behavioral: Negative for sleep disturbance.       Objective:   Physical Exam  Vitals reviewed. Constitutional: He appears well-nourished. No distress.  HENT:  Head: Normocephalic and atraumatic.  Eyes: Conjunctivae and EOM are normal. No scleral icterus.  Neck: Normal range of motion. Neck supple.  Cardiovascular: Normal rate, regular rhythm and normal heart sounds.  Exam reveals no gallop and no friction rub.   No murmur heard. Pulmonary/Chest: Effort normal and breath sounds normal. No respiratory distress. He has no wheezes.  Abdominal: He exhibits no distension. There is no tenderness.  Musculoskeletal: He exhibits no edema and no tenderness.    Neurological: He is alert.  Skin: Skin is warm and dry. Rash noted. He is not diaphoretic.  Psychiatric: His behavior is normal. His mood appears anxious. His speech is delayed. Cognition and memory are impaired. He exhibits abnormal recent memory.   He has vitiligo like skin changes       Assessment & Plan:   HIV:  doing extremely well and I congratulate he and his wife and his compliance.  He however always gets upset when this diagnosis was raised while trying to raise again with him in detail and the numbers look good and he is doing well. I spent greater than 25 minutes with the patient including greater than 50% of time in face to face counsel of the wife and patient and in coordination of their care.   Neurosyphilis: Will see what the VDRL from spinal fluid looks like if positive we will need to bring him to the intensive care unit for desensitization for penicillin therapy    Dementia, combination of Neurosyphilis +/- HIV dementia: not going to recover his antegrade memory it seems. Will need extensive support from his friend  and caregiver  PM in place

## 2013-12-18 LAB — MISCELLANEOUS TEST

## 2014-01-03 ENCOUNTER — Telehealth: Payer: Self-pay | Admitting: Infectious Disease

## 2014-01-03 NOTE — Telephone Encounter (Signed)
Patient has POSITIVE VDRL ON CSF THAT HAD I DID NOT INITIALLY SEE.  I SAW THE FTA AB BEING POSITIVE  HE NEEDS TREATMENT FOR NEUROSYPHILIS  OPTIONS ARE  ADMISSION FOR STEP DOWN FOR DESENSITIZATION TO PCN IV  WE CAN HAVE HIM COME HER NEXT WED WHEN I AM CLINIC TO FACILITATE DIRECT ADMISSION IF NEEDED TO FLOOR BED WHILE AWAITING STEP DOWN  2ND OPTION IS TO DO IV ROCEPHIN 2 GRAMS DAILY X 2 WEEKS BUT THIS IS LESS OPTIMAL

## 2014-01-04 NOTE — Telephone Encounter (Signed)
Perfect

## 2014-01-04 NOTE — Telephone Encounter (Signed)
Patient will be here next Wednesday, spoke with wife.

## 2014-01-10 ENCOUNTER — Encounter: Payer: Self-pay | Admitting: Infectious Disease

## 2014-01-10 ENCOUNTER — Ambulatory Visit (INDEPENDENT_AMBULATORY_CARE_PROVIDER_SITE_OTHER): Payer: Medicare Other | Admitting: Infectious Disease

## 2014-01-10 ENCOUNTER — Other Ambulatory Visit: Payer: Self-pay | Admitting: Licensed Clinical Social Worker

## 2014-01-10 VITALS — BP 129/86 | HR 73 | Temp 97.7°F | Wt 129.0 lb

## 2014-01-10 DIAGNOSIS — B20 Human immunodeficiency virus [HIV] disease: Secondary | ICD-10-CM | POA: Diagnosis not present

## 2014-01-10 DIAGNOSIS — A523 Neurosyphilis, unspecified: Secondary | ICD-10-CM | POA: Diagnosis not present

## 2014-01-10 DIAGNOSIS — G969 Disorder of central nervous system, unspecified: Secondary | ICD-10-CM

## 2014-01-10 DIAGNOSIS — F0391 Unspecified dementia with behavioral disturbance: Secondary | ICD-10-CM | POA: Diagnosis not present

## 2014-01-10 DIAGNOSIS — F03918 Unspecified dementia, unspecified severity, with other behavioral disturbance: Secondary | ICD-10-CM

## 2014-01-10 NOTE — Progress Notes (Signed)
   Subjective:    Patient ID: Shaun Lutz, male    DOB: 1955-11-18, 58 y.o.   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 He received 13 days of IV therapy.  He had what was thought to be PCN rash and changed to rocephin last 2 days of therapy before pulling out his IV  We were eventually able to get him onto [facility] and Truvada through emergency SPAP  His viral load is nondetectable his CD4 count remains above 300  Lab Results  Component Value Date   HIV1RNAQUANT <20 11/30/2013   Lab Results  Component Value Date   CD4TABS 330* 11/30/2013   CD4TABS 380* 10/03/2013   CD4TABS 270* 08/22/2013   His RPR failed to drop over the last 6 months remaining at 1 512. We had a lumbar puncture performed earlier in October  and CSF profile had an elevated protein but normal white blood cell count one white blood cell count on spinal fluid. CSF VDRL and CSF FTA antibodies WERE BOTH POSITIVE  We brought him in with the intention of treating him for Neurosyphilis via PCN desensitization in the ICU but upon learning that this was the purpose of the visit he became severely agitated and absolutely refused to cooperate with admission.   Review of Systems  Unable to perform ROS Skin: Positive for rash.  Psychiatric/Behavioral: Negative for sleep disturbance.       Objective:   Physical Exam  Constitutional: He appears well-developed and well-nourished.  HENT:  Head: Normocephalic and atraumatic.  Eyes: EOM are normal.  Neck: Normal range of motion. Neck supple.  Cardiovascular: Normal rate and regular rhythm.   Pulmonary/Chest: Effort normal. No respiratory distress.  Abdominal: Soft. He exhibits no distension.  Neurological: He is alert.  Psychiatric: His mood appears anxious. His affect is labile and inappropriate. His speech is tangential. Thought content is paranoid. Cognition and  memory are impaired. He expresses impulsivity and inappropriate judgment. He exhibits a depressed mood. He exhibits abnormal recent memory and abnormal remote memory.  Nursing note and vitals reviewed.    He has vitiligo like skin changes           Assessment & Plan:   HIV:  doing extremely well , continue current regimen  Neurosyphilis: because he refused desensitization therapy we will place PICC and try to give him Ceftriaxone 2grams daily for 2 weeks. First dose will need to be observed in short stay.  I spent greater than 25 minutes with the patient including greater than 50% of time in face to face counsel of the patient and in coordination of their care.     Dementia, combination of Neurosyphilis +/- HIV dementia: not going to recover his antegrade memory it seems. Will need extensive support from his friend  and caregiver

## 2014-01-14 ENCOUNTER — Other Ambulatory Visit: Payer: Self-pay | Admitting: Family Medicine

## 2014-02-08 ENCOUNTER — Ambulatory Visit (INDEPENDENT_AMBULATORY_CARE_PROVIDER_SITE_OTHER): Payer: Medicare Other | Admitting: Family Medicine

## 2014-02-08 ENCOUNTER — Encounter: Payer: Self-pay | Admitting: Family Medicine

## 2014-02-08 VITALS — BP 143/96 | HR 87 | Temp 97.6°F | Ht 68.0 in | Wt 126.0 lb

## 2014-02-08 DIAGNOSIS — Z720 Tobacco use: Secondary | ICD-10-CM | POA: Diagnosis not present

## 2014-02-08 DIAGNOSIS — R030 Elevated blood-pressure reading, without diagnosis of hypertension: Secondary | ICD-10-CM

## 2014-02-08 DIAGNOSIS — IMO0001 Reserved for inherently not codable concepts without codable children: Secondary | ICD-10-CM

## 2014-02-08 DIAGNOSIS — R269 Unspecified abnormalities of gait and mobility: Secondary | ICD-10-CM | POA: Diagnosis not present

## 2014-02-08 NOTE — Progress Notes (Signed)
    Subjective:    Patient ID: Shaun Lutz is a 58 y.o. male presenting with Hypertension  on 02/08/2014  HPI: Doing well.  Last seen by ID and told to get a PICC for some days of IV Rocephin. Has not had this set up. Walking with cane. Stumbles some, but has not fallen. Walker at home. BP up but eating a lot of salt.  Review of Systems  Constitutional: Negative for fever and chills.  Respiratory: Negative for shortness of breath.   Cardiovascular: Negative for leg swelling.  Gastrointestinal: Negative for nausea, vomiting and abdominal pain.      Objective:    BP 143/96 mmHg  Pulse 87  Temp(Src) 97.6 F (36.4 C) (Oral)  Ht 5\' 8"  (1.727 m)  Wt 126 lb (57.153 kg)  BMI 19.16 kg/m2 Physical Exam  Constitutional: He appears well-developed and well-nourished. No distress.  HENT:  Head: Normocephalic and atraumatic.  Eyes: No scleral icterus.  Neck: Neck supple.  Cardiovascular: Normal rate.   Pulmonary/Chest: Effort normal.  Abdominal: Soft.  Musculoskeletal: He exhibits no edema.  Neurological: He is alert.  Skin: Skin is warm. Rash noted.  Psychiatric: He has a normal mood and affect.  Vitals reviewed.       Assessment & Plan:    Problem List Items Addressed This Visit      Unprioritized   Abnormality of gait    To use walker as needed.    Tobacco abuse    Continue to smoke--advised to consider quitting.     Other Visit Diagnoses    Elevated blood pressure    -  Primary    Per pt, heavy salt load.  First elevated BP today.  Will check it again next visit.

## 2014-02-08 NOTE — Patient Instructions (Signed)
DASH Eating Plan °DASH stands for "Dietary Approaches to Stop Hypertension." The DASH eating plan is a healthy eating plan that has been shown to reduce high blood pressure (hypertension). Additional health benefits may include reducing the risk of type 2 diabetes mellitus, heart disease, and stroke. The DASH eating plan may also help with weight loss. °WHAT DO I NEED TO KNOW ABOUT THE DASH EATING PLAN? °For the DASH eating plan, you will follow these general guidelines: °· Choose foods with a percent daily value for sodium of less than 5% (as listed on the food label). °· Use salt-free seasonings or herbs instead of table salt or sea salt. °· Check with your health care provider or pharmacist before using salt substitutes. °· Eat lower-sodium products, often labeled as "lower sodium" or "no salt added." °· Eat fresh foods. °· Eat more vegetables, fruits, and low-fat dairy products. °· Choose whole grains. Look for the word "whole" as the first word in the ingredient list. °· Choose fish and skinless chicken or turkey more often than red meat. Limit fish, poultry, and meat to 6 oz (170 g) each day. °· Limit sweets, desserts, sugars, and sugary drinks. °· Choose heart-healthy fats. °· Limit cheese to 1 oz (28 g) per day. °· Eat more home-cooked food and less restaurant, buffet, and fast food. °· Limit fried foods. °· Cook foods using methods other than frying. °· Limit canned vegetables. If you do use them, rinse them well to decrease the sodium. °· When eating at a restaurant, ask that your food be prepared with less salt, or no salt if possible. °WHAT FOODS CAN I EAT? °Seek help from a dietitian for individual calorie needs. °Grains °Whole grain or whole wheat bread. Brown rice. Whole grain or whole wheat pasta. Quinoa, bulgur, and whole grain cereals. Low-sodium cereals. Corn or whole wheat flour tortillas. Whole grain cornbread. Whole grain crackers. Low-sodium crackers. °Vegetables °Fresh or frozen vegetables  (raw, steamed, roasted, or grilled). Low-sodium or reduced-sodium tomato and vegetable juices. Low-sodium or reduced-sodium tomato sauce and paste. Low-sodium or reduced-sodium canned vegetables.  °Fruits °All fresh, canned (in natural juice), or frozen fruits. °Meat and Other Protein Products °Ground beef (85% or leaner), grass-fed beef, or beef trimmed of fat. Skinless chicken or turkey. Ground chicken or turkey. Pork trimmed of fat. All fish and seafood. Eggs. Dried beans, peas, or lentils. Unsalted nuts and seeds. Unsalted canned beans. °Dairy °Low-fat dairy products, such as skim or 1% milk, 2% or reduced-fat cheeses, low-fat ricotta or cottage cheese, or plain low-fat yogurt. Low-sodium or reduced-sodium cheeses. °Fats and Oils °Tub margarines without trans fats. Light or reduced-fat mayonnaise and salad dressings (reduced sodium). Avocado. Safflower, olive, or canola oils. Natural peanut or almond butter. °Other °Unsalted popcorn and pretzels. °The items listed above may not be a complete list of recommended foods or beverages. Contact your dietitian for more options. °WHAT FOODS ARE NOT RECOMMENDED? °Grains °White bread. White pasta. White rice. Refined cornbread. Bagels and croissants. Crackers that contain trans fat. °Vegetables °Creamed or fried vegetables. Vegetables in a cheese sauce. Regular canned vegetables. Regular canned tomato sauce and paste. Regular tomato and vegetable juices. °Fruits °Dried fruits. Canned fruit in light or heavy syrup. Fruit juice. °Meat and Other Protein Products °Fatty cuts of meat. Ribs, chicken wings, bacon, sausage, bologna, salami, chitterlings, fatback, hot dogs, bratwurst, and packaged luncheon meats. Salted nuts and seeds. Canned beans with salt. °Dairy °Whole or 2% milk, cream, half-and-half, and cream cheese. Whole-fat or sweetened yogurt. Full-fat   cheeses or blue cheese. Nondairy creamers and whipped toppings. Processed cheese, cheese spreads, or cheese  curds. °Condiments °Onion and garlic salt, seasoned salt, table salt, and sea salt. Canned and packaged gravies. Worcestershire sauce. Tartar sauce. Barbecue sauce. Teriyaki sauce. Soy sauce, including reduced sodium. Steak sauce. Fish sauce. Oyster sauce. Cocktail sauce. Horseradish. Ketchup and mustard. Meat flavorings and tenderizers. Bouillon cubes. Hot sauce. Tabasco sauce. Marinades. Taco seasonings. Relishes. °Fats and Oils °Butter, stick margarine, lard, shortening, ghee, and bacon fat. Coconut, palm kernel, or palm oils. Regular salad dressings. °Other °Pickles and olives. Salted popcorn and pretzels. °The items listed above may not be a complete list of foods and beverages to avoid. Contact your dietitian for more information. °WHERE CAN I FIND MORE INFORMATION? °National [employer], [employer], [employer]: www.nhlbi.nih.gov/health/health-topics/topics/dash/ °Document Released: 02/05/2011 Document Revised: 07/03/2013 Document Reviewed: 12/21/2012 °Exit[employer]® Patient Information ©2015 [employer]. This information is not intended to replace advice given to you by your health care provider. Make sure you discuss any questions you have with your health care provider. ° °

## 2014-02-08 NOTE — Assessment & Plan Note (Signed)
To use walker as needed.

## 2014-02-08 NOTE — Assessment & Plan Note (Signed)
Continue to smoke--advised to consider quitting.

## 2014-02-13 ENCOUNTER — Telehealth: Payer: Self-pay | Admitting: Licensed Clinical Social Worker

## 2014-02-13 NOTE — Telephone Encounter (Signed)
Spoke with patient and his  wife today and patient wants to have picc line placed instead of injections. Patient also states that he wants to have this done after the holidays. I explained that I would call him with an appointment to have picc placement the week after Christmas. Patient agreed.

## 2014-02-13 NOTE — Telephone Encounter (Signed)
Excellent

## 2014-02-19 ENCOUNTER — Other Ambulatory Visit: Payer: Self-pay | Admitting: Family Medicine

## 2014-02-21 ENCOUNTER — Ambulatory Visit (INDEPENDENT_AMBULATORY_CARE_PROVIDER_SITE_OTHER): Payer: Medicare Other | Admitting: *Deleted

## 2014-02-21 DIAGNOSIS — I442 Atrioventricular block, complete: Secondary | ICD-10-CM

## 2014-02-21 DIAGNOSIS — I441 Atrioventricular block, second degree: Secondary | ICD-10-CM | POA: Diagnosis not present

## 2014-02-21 LAB — MDC_IDC_ENUM_SESS_TYPE_INCLINIC
Battery Remaining Longevity: 120 mo
Battery Voltage: 2.79 V
Date Time Interrogation Session: 20151223102608
Lead Channel Pacing Threshold Amplitude: 0.5 V
Lead Channel Pacing Threshold Pulse Width: 0.4 ms
Lead Channel Sensing Intrinsic Amplitude: 2 mV
Lead Channel Sensing Intrinsic Amplitude: 8 mV
Lead Channel Setting Pacing Amplitude: 2 V
Lead Channel Setting Pacing Amplitude: 2.5 V
Lead Channel Setting Pacing Pulse Width: 0.46 ms
MDC IDC MSMT BATTERY IMPEDANCE: 227 Ohm
MDC IDC MSMT LEADCHNL RA IMPEDANCE VALUE: 409 Ohm
MDC IDC MSMT LEADCHNL RA PACING THRESHOLD AMPLITUDE: 0.75 V
MDC IDC MSMT LEADCHNL RV IMPEDANCE VALUE: 451 Ohm
MDC IDC MSMT LEADCHNL RV PACING THRESHOLD PULSEWIDTH: 0.46 ms
MDC IDC SET LEADCHNL RV SENSING SENSITIVITY: 2.8 mV
MDC IDC STAT BRADY AP VP PERCENT: 0 %
MDC IDC STAT BRADY AP VS PERCENT: 56 %
MDC IDC STAT BRADY AS VP PERCENT: 0 %
MDC IDC STAT BRADY AS VS PERCENT: 43 %

## 2014-02-21 NOTE — Progress Notes (Signed)
Pacemaker check in clinic. Normal device function. Thresholds, sensing, impedances consistent with previous measurements. Device programmed to maximize longevity. 36 mode switches (<0.1%)---5 AHR episodes---max dur. 2 mins, Max A 400, Max Avg V 129---AT/AFL + ASA 81. 43 high ventricular rates noted---max dur. 10 sec, Max Avg V 165---SVT/brief NSVT. Device programmed at appropriate safety margins. Histogram distribution appropriate for patient activity level. Device programmed to optimize intrinsic conduction. Estimated longevity 10 years. Patient will follow up with [facility] in 6 months.

## 2014-03-30 ENCOUNTER — Encounter: Payer: Self-pay | Admitting: Internal Medicine

## 2014-04-03 ENCOUNTER — Other Ambulatory Visit: Payer: Self-pay | Admitting: Family Medicine

## 2014-04-03 ENCOUNTER — Ambulatory Visit: Payer: Medicare Other

## 2014-04-04 ENCOUNTER — Other Ambulatory Visit: Payer: Self-pay | Admitting: *Deleted

## 2014-04-04 NOTE — Telephone Encounter (Signed)
Patient requesting refill on atenolol. [employer]

## 2014-04-05 ENCOUNTER — Other Ambulatory Visit: Payer: Self-pay | Admitting: *Deleted

## 2014-04-05 DIAGNOSIS — B20 Human immunodeficiency virus [HIV] disease: Secondary | ICD-10-CM

## 2014-04-05 MED ORDER — DOLUTEGRAVIR SODIUM 50 MG PO TABS: 50.0000 mg | ORAL_TABLET | Freq: Every day | ORAL | Status: DC

## 2014-04-05 MED ORDER — EMTRICITABINE-TENOFOVIR DF 200-300 MG PO TABS: 1.0000 | ORAL_TABLET | Freq: Every day | ORAL | Status: DC

## 2014-04-05 MED ORDER — ATENOLOL 25 MG PO TABS: 25.0000 mg | ORAL_TABLET | Freq: Every day | ORAL | Status: DC

## 2014-04-05 NOTE — Telephone Encounter (Signed)
ADAP Application 

## 2014-04-18 ENCOUNTER — Ambulatory Visit: Payer: Self-pay | Admitting: Infectious Disease

## 2014-05-21 DIAGNOSIS — R339 Retention of urine, unspecified: Secondary | ICD-10-CM | POA: Diagnosis not present

## 2014-05-21 DIAGNOSIS — N281 Cyst of kidney, acquired: Secondary | ICD-10-CM | POA: Diagnosis not present

## 2014-06-04 ENCOUNTER — Ambulatory Visit: Payer: Self-pay | Admitting: Family Medicine

## 2014-06-05 ENCOUNTER — Encounter: Payer: Self-pay | Admitting: Family Medicine

## 2014-06-05 ENCOUNTER — Ambulatory Visit (INDEPENDENT_AMBULATORY_CARE_PROVIDER_SITE_OTHER): Payer: Medicare Other | Admitting: Family Medicine

## 2014-06-05 VITALS — BP 119/72 | HR 66 | Temp 97.7°F | Ht 68.0 in | Wt 117.6 lb

## 2014-06-05 DIAGNOSIS — J189 Pneumonia, unspecified organism: Secondary | ICD-10-CM

## 2014-06-05 MED ORDER — HYDROCODONE-HOMATROPINE 5-1.5 MG/5ML PO SYRP: 5.0000 mL | ORAL_SOLUTION | Freq: Three times a day (TID) | ORAL | Status: DC | PRN

## 2014-06-05 MED ORDER — SULFAMETHOXAZOLE-TRIMETHOPRIM 800-160 MG PO TABS: 1.0000 | ORAL_TABLET | Freq: Two times a day (BID) | ORAL | Status: DC

## 2014-06-05 NOTE — Patient Instructions (Signed)
Thank you for coming in, today!  I think you have a cold that has settled into an early pneumonia. I want you to take an antibiotic called Bactrim (trimethoprim-sulfamethoxizole) twice a day for 10 days. You can take a medicine called Hycodan (hydrocodone-homatropine) for cough. Hycodan has hydrocodone in it (a narcotic), so it will also help with pain and may make you very sleepy. You can also try Mucinex to help with your congestion.  Make sure you're eating as best you can. Foods like soups are good to help keep your fluid intake up, and chicken soup especially is a good source of protein.  Come back to see Dr. Kennon Rounds as you need. Give Dr. Johnnye Sima a call to set up a general follow-up appointment, as well. Please feel free to call with any questions or concerns at any time, at [phone]. --Dr. Venetia Maxon

## 2014-06-05 NOTE — Progress Notes (Signed)
   Subjective:    Patient ID: Shaun Lutz, male    DOB: 14-Aug-1955, 59 y.o.   MRN: [id number]  HPI: Pt presents to [facility], for URI symptoms. He has had cough, chest congestion and head congestion, with one night of some tactile fever (not checked with thermometer). His cough has been productive of thick yellowish sputum without blood in it. He has some side and back pain that is worse with cough. He has no shortness of breath. He has no vomiting but does have some chronic poor appetite. Pt's girlfriend had similar symptoms a couple / few weeks ago and it cleared up on its own.  Of note, pt is a current smoker, but a pack of cigarettes may last him "a week." He states he is in the process of seeing a dentist. He has a history of HIV and is compliant with Trivicay and Truvada. He is due to see them sometimes soon, but they have not called him with an appointment. Pt has some memory deficit with history of neurosyphilis which has now been treated; his girlfriend of many years helps him manage his medicines.  Review of Systems: As above.     Objective:   Physical Exam BP 119/72 mmHg  Pulse 66  Temp(Src) 97.7 F (36.5 C) (Oral)  Ht 5\' 8"  (1.727 m)  Wt 117 lb 9.6 oz (53.343 kg)  BMI 17.89 kg/m2 Gen: non-ill but very thin adult male in NAD HEENT: [place]/AT, EOMI, PERRLA, MMM, very poor dentition with missing teeth, broken teeth, carious teeth  Nasal and posterior oropharynx mildly red / edematous Neck: supple, full ROM, few small lymph nodes palpable Cardio: RRR, no murmur appreciated Pulm: cough with deep breath for exam, with upper fields generally clear  Lower fields coarser, R worse than L Abd: soft, nontender, BS+ MSK: diffuse tenderness along costal margins, worse on the right     Assessment & Plan:  59yo male with hx of HIV (compliant with meds) and neurosyphilis (treated) with suspected CAP - general well appearance, nonlabored breathing, and afebrile, so defer imaging or labs; last viral  load undetectable in October 2015 - Rx for Bactrim BID for 10 days and recommended setting up f/u with ID (Dr. Tommy Medal) - Rx for Hycodan for cough and pain, with suggestion of trial of Mucinex for congestion - f/u with PCP Dr. Kennon Rounds as needed, otherwise - reviewed red flags that would indicate prompt return to clinic or presentation to the ED (worse cough, fever, difficulty breathing, etc)  Emmaline Kluver, MD PGY-3, [facility] 06/05/2014, 9:10 AM

## 2014-07-04 ENCOUNTER — Other Ambulatory Visit: Payer: Self-pay | Admitting: Family Medicine

## 2014-07-04 MED ORDER — ATENOLOL 25 MG PO TABS: 25.0000 mg | ORAL_TABLET | Freq: Every day | ORAL | Status: DC

## 2014-07-04 NOTE — Telephone Encounter (Signed)
Is completely out of atenolol Has an appt with pratt 07-11-14 Can he get enough to last untl next wed? [facility] [address]

## 2014-07-11 ENCOUNTER — Encounter: Payer: Self-pay | Admitting: Family Medicine

## 2014-07-11 ENCOUNTER — Ambulatory Visit (INDEPENDENT_AMBULATORY_CARE_PROVIDER_SITE_OTHER): Payer: Medicare Other | Admitting: Family Medicine

## 2014-07-11 VITALS — BP 136/91 | HR 69 | Temp 98.0°F | Ht 68.0 in | Wt 119.2 lb

## 2014-07-11 DIAGNOSIS — G4700 Insomnia, unspecified: Secondary | ICD-10-CM

## 2014-07-11 DIAGNOSIS — B20 Human immunodeficiency virus [HIV] disease: Secondary | ICD-10-CM

## 2014-07-11 DIAGNOSIS — A539 Syphilis, unspecified: Secondary | ICD-10-CM | POA: Diagnosis not present

## 2014-07-11 DIAGNOSIS — Z21 Asymptomatic human immunodeficiency virus [HIV] infection status: Secondary | ICD-10-CM | POA: Diagnosis not present

## 2014-07-11 DIAGNOSIS — Z720 Tobacco use: Secondary | ICD-10-CM | POA: Diagnosis not present

## 2014-07-11 DIAGNOSIS — F329 Major depressive disorder, single episode, unspecified: Secondary | ICD-10-CM | POA: Diagnosis not present

## 2014-07-11 DIAGNOSIS — A523 Neurosyphilis, unspecified: Secondary | ICD-10-CM

## 2014-07-11 DIAGNOSIS — R269 Unspecified abnormalities of gait and mobility: Secondary | ICD-10-CM | POA: Diagnosis not present

## 2014-07-11 DIAGNOSIS — R4589 Other symptoms and signs involving emotional state: Secondary | ICD-10-CM | POA: Insufficient documentation

## 2014-07-11 MED ORDER — DOXEPIN HCL 10 MG PO CAPS: 10.0000 mg | ORAL_CAPSULE | Freq: Every day | ORAL | Status: DC

## 2014-07-11 NOTE — Assessment & Plan Note (Signed)
Continue to encourage smoking cessation. 

## 2014-07-11 NOTE — Progress Notes (Signed)
    Subjective:    Patient ID: Shaun Lutz is a 59 y.o. male presenting with Follow-up  on 07/11/2014  HPI: Reports no pain. Has not seen ID since getting out of the hospital.  Reports having trouble sleeping, cannot stay asleep.  Awakens at 2 am--awake unitl 5-6 and then back to sleep for several hours. Continues to smoke--is not really interested in quitting. Discussed f/u with ID. He reports never having pain and being unsure how he got his diagnoses.  Reports impotence right now. He gets tearful discussing his diagnoses and future, stating, "if I have AIDS, I may as well commit suicide".  Review of Systems  Constitutional: Negative for fever and chills.  Respiratory: Negative for shortness of breath.   Cardiovascular: Negative for leg swelling.  Gastrointestinal: Negative for nausea, vomiting and abdominal pain.      Objective:    BP 136/91 mmHg  Pulse 69  Temp(Src) 98 F (36.7 C) (Oral)  Ht 5\' 8"  (1.727 m)  Wt 119 lb 3 oz (54.063 kg)  BMI 18.13 kg/m2 Physical Exam  Constitutional: He appears well-developed and well-nourished. No distress.  HENT:  Head: Normocephalic and atraumatic.  Eyes: No scleral icterus.  Neck: Neck supple.  Cardiovascular: Normal rate.   Pulmonary/Chest: Effort normal.  Abdominal: Soft.  Musculoskeletal: He exhibits no edema.  Neurological: He is alert.  Intention tremor  Skin: Skin is warm. Rash (vitiligo noted on multiple surfaces) noted.  Psychiatric: He has a normal mood and affect.  Vitals reviewed.       Assessment & Plan:   Problem List Items Addressed This Visit      Unprioritized   HIV (human immunodeficiency virus infection) (Chronic)    Needs to return to see Dr. Lucianne Lei Dam--taking po meds currently, but no f/u labs have been done.  Also, tried to discuss that this is a chronic disease, that many are living with.      Relevant Orders   Ambulatory referral to Infectious Disease   Abnormality of gait    Getting by with a cane  now.      Syphilis in male    Still unclear that he has completed treatment in a meaningful way.      Relevant Orders   Ambulatory referral to Infectious Disease   Tobacco abuse    Continue to encourage smoking cessation      Neurosyphilis    Return to ID to ensure no further treatment is necessary.      Depressed mood    Mostly around diagnoses and feeling hopeless.       Other Visit Diagnoses    Insomnia    -  Primary    Relevant Medications    doxepin (SINEQUAN) 10 MG capsule       Return in about 3 months (around 10/11/2014).  Akshar Starnes S 07/11/2014 3:12 PM

## 2014-07-11 NOTE — Assessment & Plan Note (Addendum)
Needs to return to see Dr. Lucianne Lei Dam--taking po meds currently, but no f/u labs have been done.  Also, tried to discuss that this is a chronic disease, that many are living with.

## 2014-07-11 NOTE — Assessment & Plan Note (Signed)
Still unclear that he has completed treatment in a meaningful way.

## 2014-07-11 NOTE — Assessment & Plan Note (Signed)
Getting by with a cane now.

## 2014-07-11 NOTE — Patient Instructions (Addendum)
Insomnia Insomnia is frequent trouble falling and/or staying asleep. Insomnia can be a long term problem or a short term problem. Both are common. Insomnia can be a short term problem when the wakefulness is related to a certain stress or worry. Long term insomnia is often related to ongoing stress during waking hours and/or poor sleeping habits. Overtime, sleep deprivation itself can make the problem worse. Every little thing feels more severe because you are overtired and your ability to cope is decreased. CAUSES   Stress, anxiety, and depression.  Poor sleeping habits.  Distractions such as TV in the bedroom.  Naps close to bedtime.  Engaging in emotionally charged conversations before bed.  Technical reading before sleep.  Alcohol and other sedatives. They may make the problem worse. They can hurt normal sleep patterns and normal dream activity.  Stimulants such as caffeine for several hours prior to bedtime.  Pain syndromes and shortness of breath can cause insomnia.  Exercise late at night.  Changing time zones may cause sleeping problems (jet lag). It is sometimes helpful to have someone observe your sleeping patterns. They should look for periods of not breathing during the night (sleep apnea). They should also look to see how long those periods last. If you live alone or observers are uncertain, you can also be observed at a sleep clinic where your sleep patterns will be professionally monitored. Sleep apnea requires a checkup and treatment. Give your caregivers your medical history. Give your caregivers observations your family has made about your sleep.  SYMPTOMS   Not feeling rested in the morning.  Anxiety and restlessness at bedtime.  Difficulty falling and staying asleep. TREATMENT   Your caregiver may prescribe treatment for an underlying medical disorders. Your caregiver can give advice or help if you are using alcohol or other drugs for self-medication. Treatment  of underlying problems will usually eliminate insomnia problems.  Medications can be prescribed for short time use. They are generally not recommended for lengthy use.  Over-the-counter sleep medicines are not recommended for lengthy use. They can be habit forming.  You can promote easier sleeping by making lifestyle changes such as:  Using relaxation techniques that help with breathing and reduce muscle tension.  Exercising earlier in the day.  Changing your diet and the time of your last meal. No night time snacks.  Establish a regular time to go to bed.  Counseling can help with stressful problems and worry.  Soothing music and white noise may be helpful if there are background noises you cannot remove.  Stop tedious detailed work at least one hour before bedtime. HOME CARE INSTRUCTIONS   Keep a diary. Inform your caregiver about your progress. This includes any medication side effects. See your caregiver regularly. Take note of:  Times when you are asleep.  Times when you are awake during the night.  The quality of your sleep.  How you feel the next day. This information will help your caregiver care for you.  Get out of bed if you are still awake after 15 minutes. Read or do some quiet activity. Keep the lights down. Wait until you feel sleepy and go back to bed.  Keep regular sleeping and waking hours. Avoid naps.  Exercise regularly.  Avoid distractions at bedtime. Distractions include watching television or engaging in any intense or detailed activity like attempting to balance the household checkbook.  Develop a bedtime ritual. Keep a familiar routine of bathing, brushing your teeth, climbing into bed at the same   time each night, listening to soothing music. Routines increase the success of falling to sleep faster.  Use relaxation techniques. This can be using breathing and muscle tension release routines. It can also include visualizing peaceful scenes. You can  also help control troubling or intruding thoughts by keeping your mind occupied with boring or repetitive thoughts like the old concept of counting sheep. You can make it more creative like imagining planting one beautiful flower after another in your backyard garden.  During your day, work to eliminate stress. When this is not possible use some of the previous suggestions to help reduce the anxiety that accompanies stressful situations. MAKE SURE YOU:   Understand these instructions.  Will watch your condition.  Will get help right away if you are not doing well or get worse. Document Released: 02/14/2000 Document Revised: 05/11/2011 Document Reviewed: 03/16/2007 [facility] Patient Information 2015 [place], [place]. This information is not intended to replace advice given to you by your health care provider. Make sure you discuss any questions you have with your health care provider. Smoking Cessation Quitting smoking is important to your health and has many advantages. However, it is not always easy to quit since nicotine is a very addictive drug. Oftentimes, people try 3 times or more before being able to quit. This document explains the best ways for you to prepare to quit smoking. Quitting takes hard work and a lot of effort, but you can do it. ADVANTAGES OF QUITTING SMOKING  You will live longer, feel better, and live better.  Your body will feel the impact of quitting smoking almost immediately.  Within 20 minutes, blood pressure decreases. Your pulse returns to its normal level.  After 8 hours, carbon monoxide levels in the blood return to normal. Your oxygen level increases.  After 24 hours, the chance of having a heart attack starts to decrease. Your breath, hair, and body stop smelling like smoke.  After 48 hours, damaged nerve endings begin to recover. Your sense of taste and smell improve.  After 72 hours, the body is virtually free of nicotine. Your bronchial tubes relax and  breathing becomes easier.  After 2 to 12 weeks, lungs can hold more air. Exercise becomes easier and circulation improves.  The risk of having a heart attack, stroke, cancer, or lung disease is greatly reduced.  After 1 year, the risk of coronary heart disease is cut in half.  After 5 years, the risk of stroke falls to the same as a nonsmoker.  After 10 years, the risk of lung cancer is cut in half and the risk of other cancers decreases significantly.  After 15 years, the risk of coronary heart disease drops, usually to the level of a nonsmoker.  If you are pregnant, quitting smoking will improve your chances of having a healthy baby.  The people you live with, especially any children, will be healthier.  You will have extra money to spend on things other than cigarettes. QUESTIONS TO THINK ABOUT BEFORE ATTEMPTING TO QUIT You may want to talk about your answers with your health care provider.  Why do you want to quit?  If you tried to quit in the past, what helped and what did not?  What will be the most difficult situations for you after you quit? How will you plan to handle them?  Who can help you through the tough times? Your family? Friends? A health care provider?  What pleasures do you get from smoking? What ways can you still  get pleasure if you quit? Here are some questions to ask your health care provider:  How can you help me to be successful at quitting?  What medicine do you think would be best for me and how should I take it?  What should I do if I need more help?  What is smoking withdrawal like? How can I get information on withdrawal? GET READY  Set a quit date.  Change your environment by getting rid of all cigarettes, ashtrays, matches, and lighters in your home, car, or work. Do not let people smoke in your home.  Review your past attempts to quit. Think about what worked and what did not. GET SUPPORT AND ENCOURAGEMENT You have a better chance of  being successful if you have help. You can get support in many ways.  Tell your family, friends, and coworkers that you are going to quit and need their support. Ask them not to smoke around you.  Get individual, group, or telephone counseling and support. Programs are available at [employer] and health centers. Call your local health department for information about programs in your area.  Spiritual beliefs and practices may help some smokers quit.  Download a "quit meter" on your computer to keep track of quit statistics, such as how long you have gone without smoking, cigarettes not smoked, and money saved.  Get a self-help book about quitting smoking and staying off tobacco. [place] yourself from urges to smoke. Talk to someone, go for a walk, or occupy your time with a task.  Change your normal routine. Take a different route to work. Drink tea instead of coffee. Eat breakfast in a different place.  Reduce your stress. Take a hot bath, exercise, or read a book.  Plan something enjoyable to do every day. Reward yourself for not smoking.  Explore interactive web-based programs that specialize in helping you quit. GET MEDICINE AND USE IT CORRECTLY Medicines can help you stop smoking and decrease the urge to smoke. Combining medicine with the above behavioral methods and support can greatly increase your chances of successfully quitting smoking.  Nicotine replacement therapy helps deliver nicotine to your body without the negative effects and risks of smoking. Nicotine replacement therapy includes nicotine gum, lozenges, inhalers, nasal sprays, and skin patches. Some may be available over-the-counter and others require a prescription.  Antidepressant medicine helps people abstain from smoking, but how this works is unknown. This medicine is available by prescription.  Nicotinic receptor partial agonist medicine simulates the effect of nicotine in  your brain. This medicine is available by prescription. Ask your health care provider for advice about which medicines to use and how to use them based on your health history. Your health care provider will tell you what side effects to look out for if you choose to be on a medicine or therapy. Carefully read the information on the package. Do not use any other product containing nicotine while using a nicotine replacement product.  RELAPSE OR DIFFICULT SITUATIONS Most relapses occur within the first 3 months after quitting. Do not be discouraged if you start smoking again. Remember, most people try several times before finally quitting. You may have symptoms of withdrawal because your body is used to nicotine. You may crave cigarettes, be irritable, feel very hungry, cough often, get headaches, or have difficulty concentrating. The withdrawal symptoms are only temporary. They are strongest when you first quit, but they will go away within 10-14 days. To  reduce the chances of relapse, try to:  Avoid drinking alcohol. Drinking lowers your chances of successfully quitting.  Reduce the amount of caffeine you consume. Once you quit smoking, the amount of caffeine in your body increases and can give you symptoms, such as a rapid heartbeat, sweating, and anxiety.  Avoid smokers because they can make you want to smoke.  Do not let weight gain distract you. Many smokers will gain weight when they quit, usually less than 10 pounds. Eat a healthy diet and stay active. You can always lose the weight gained after you quit.  Find ways to improve your mood other than smoking. FOR MORE INFORMATION  www.smokefree.gov  Document Released: 02/10/2001 Document Revised: 07/03/2013 Document Reviewed: 05/28/2011 [facility] Patient Information 2015 [place], [place]. This information is not intended to replace advice given to you by your health care provider. Make sure you discuss any questions you have with your health care  provider. Syphilis Syphilis is an infectious disease. It can cause serious complications if left untreated.  CAUSES  Syphilis is caused by a type of bacteria called Treponema pallidum. It is most commonly spread through sexual contact. Syphilis may also spread to a fetus through the blood of the mother.  SIGNS AND SYMPTOMS Symptoms vary depending on the stage of the disease. Some symptoms may disappear without treatment. However, this does not mean that the infection is gone. One form of syphilis (called latent syphilis) has no symptoms.  Primary Syphilis  Painless sores (chancres) in and around the genital organs and mouth.  Swollen lymph nodes near the sores. Secondary Syphilis  A rash or sores over any portion of the body, including the palms of the hands and soles of the feet.  Fever.  Headache.  Sore throat.  Swollen lymph nodes.  New sores in the mouth or on the genitals.  Feeling generally ill.  Having pain in the joints. Tertiary Syphilis The third stage of syphilis involves severe damage to different organs in the body, such as the brain, spinal cord, and heart. Signs and symptoms may include:   Dementia.  Personality and mood changes.  Difficulty walking.  Heart failure.  Fainting.  Enlargement (aneurysm) of the aorta.  Tumors of the skin, bones, or liver.  Muscle weakness.  Sudden "lightning" pains, numbness, or tingling.  Problems with coordination.  Vision changes. DIAGNOSIS   A physical exam will be done.  Blood tests will be done to confirm the diagnosis.  If the disease is in the first or second stages, a fluid (drainage) sample from a sore or rash may be examined under a microscope to detect the disease-causing bacteria.  Fluid around the spine may need to be examined to detect brain damage or inflammation of the brain lining (meningitis).  If the disease is in the third stage, X-rays, CT scans, MRIs, echocardiograms, ultrasounds, or  cardiac catheterization may also be done to detect disease of the heart, aorta, or brain. TREATMENT  Syphilis can be cured with antibiotic medicine if a diagnosis is made early. During the first day of treatment, you may experience fever, chills, headache, nausea, or aching all over your body. This is a normal reaction to the antibiotics.  HOME CARE INSTRUCTIONS   Take your antibiotic medicine as directed by your health care provider. Finish the antibiotic even if you start to feel better. Incomplete treatment will put you at risk for continued infection and could be life threatening.  Take medicines only as directed by your health care provider.  Do not have sexual intercourse until your treatment is completed or as directed by your health care provider.  Inform your recent sexual partners that you were diagnosed with syphilis. They need to seek care and treatment, even if they have no symptoms. It is necessary that all your sexual partners be tested for infection and treated if they have the disease.  Keep all follow-up visits as directed by your health care provider. It is important to keep all your appointments.  If your test results are not ready during your visit, make an appointment with your health care provider to find out the results. Do not assume everything is normal if you have not heard from your health care provider or the medical facility. It is your responsibility to get your test results. SEEK MEDICAL CARE IF:  You continue to have any of the following 24 hours after beginning treatment:  Fever.  Chills.  Headache.  Nausea.  Aching all over your body.  You have symptoms of an allergic reaction to medicine, such as:  Chills.  A headache.  Light-headedness.  A new rash (especially hives).  Difficulty breathing. MAKE SURE YOU:   Understand these instructions.  Will watch your condition.  Will get help right away if you are not doing well or get  worse. Document Released: 12/07/2012 Document Revised: 07/03/2013 Document Reviewed: 12/07/2012 [facility] Patient Information 2015 [place], [place]. This information is not intended to replace advice given to you by your health care provider. Make sure you discuss any questions you have with your health care provider. AIDS

## 2014-07-11 NOTE — Assessment & Plan Note (Signed)
Mostly around diagnoses and feeling hopeless.

## 2014-07-11 NOTE — Assessment & Plan Note (Signed)
Return to ID to ensure no further treatment is necessary.

## 2014-08-13 ENCOUNTER — Telehealth: Payer: Self-pay | Admitting: Family Medicine

## 2014-08-13 MED ORDER — ATENOLOL 25 MG PO TABS: 25.0000 mg | ORAL_TABLET | Freq: Every day | ORAL | Status: DC

## 2014-08-13 NOTE — Telephone Encounter (Signed)
Refill sent.  Derl Barrow, RN

## 2014-08-13 NOTE — Telephone Encounter (Signed)
Ms. Ronnie Doss is calling on behalf of Shaun Lutz to request a refill on atenolol (TENORMIN) 25 MG tablet. Please inform the patient once it is completed. Thank you, Fonda Kinder, ASA

## 2014-08-16 ENCOUNTER — Other Ambulatory Visit: Payer: Self-pay | Admitting: *Deleted

## 2014-08-16 ENCOUNTER — Other Ambulatory Visit: Payer: Self-pay | Admitting: Infectious Disease

## 2014-08-16 DIAGNOSIS — B20 Human immunodeficiency virus [HIV] disease: Secondary | ICD-10-CM

## 2014-08-16 MED ORDER — EMTRICITABINE-TENOFOVIR DF 200-300 MG PO TABS: 1.0000 | ORAL_TABLET | Freq: Every day | ORAL | Status: DC

## 2014-08-16 MED ORDER — DOLUTEGRAVIR SODIUM 50 MG PO TABS: 50.0000 mg | ORAL_TABLET | Freq: Every day | ORAL | Status: DC

## 2014-08-16 NOTE — Progress Notes (Signed)
Patient called for an appointment, refills. Sent 3 month supply to [facility]. Patient scheduled for labs, follow up.

## 2014-08-20 ENCOUNTER — Other Ambulatory Visit: Payer: Medicare Other

## 2014-08-20 DIAGNOSIS — B20 Human immunodeficiency virus [HIV] disease: Secondary | ICD-10-CM

## 2014-08-20 LAB — CBC WITH DIFFERENTIAL/PLATELET
Basophils Absolute: 0.1 10*3/uL (ref 0.0–0.1)
Basophils Relative: 1 % (ref 0–1)
EOS PCT: 8 % — AB (ref 0–5)
Eosinophils Absolute: 0.5 10*3/uL (ref 0.0–0.7)
HCT: 36.2 % — ABNORMAL LOW (ref 39.0–52.0)
HEMOGLOBIN: 11.8 g/dL — AB (ref 13.0–17.0)
LYMPHS ABS: 1.5 10*3/uL (ref 0.7–4.0)
LYMPHS PCT: 24 % (ref 12–46)
MCH: 30.6 pg (ref 26.0–34.0)
MCHC: 32.6 g/dL (ref 30.0–36.0)
MCV: 93.8 fL (ref 78.0–100.0)
MPV: 9.9 fL (ref 8.6–12.4)
Monocytes Absolute: 0.5 10*3/uL (ref 0.1–1.0)
Monocytes Relative: 8 % (ref 3–12)
NEUTROS PCT: 59 % (ref 43–77)
Neutro Abs: 3.8 10*3/uL (ref 1.7–7.7)
Platelets: 248 10*3/uL (ref 150–400)
RBC: 3.86 MIL/uL — AB (ref 4.22–5.81)
RDW: 13.6 % (ref 11.5–15.5)
WBC: 6.4 10*3/uL (ref 4.0–10.5)

## 2014-08-20 LAB — COMPLETE METABOLIC PANEL WITH GFR
ALK PHOS: 73 U/L (ref 39–117)
AST: 12 U/L (ref 0–37)
Albumin: 3.8 g/dL (ref 3.5–5.2)
BUN: 11 mg/dL (ref 6–23)
CO2: 27 mEq/L (ref 19–32)
Calcium: 8.8 mg/dL (ref 8.4–10.5)
Chloride: 107 mEq/L (ref 96–112)
Creat: 0.88 mg/dL (ref 0.50–1.35)
GFR, Est Non African American: >89 mL/min
Glucose, Bld: 91 mg/dL (ref 70–99)
POTASSIUM: 4.1 meq/L (ref 3.5–5.3)
Sodium: 142 mEq/L (ref 135–145)
TOTAL PROTEIN: 7 g/dL (ref 6.0–8.3)
Total Bilirubin: 0.5 mg/dL (ref 0.2–1.2)

## 2014-08-21 LAB — FLUORESCENT TREPONEMAL AB(FTA)-IGG-BLD: FLUORESCENT TREPONEMAL ABS: REACTIVE — AB

## 2014-08-21 LAB — RPR: RPR Ser Ql: REACTIVE — AB

## 2014-08-21 LAB — T-HELPER CELL (CD4) - (RCID CLINIC ONLY)
CD4 % Helper T Cell: 20 % — ABNORMAL LOW (ref 33–55)
CD4 T CELL ABS: 280 /uL — AB (ref 400–2700)

## 2014-08-21 LAB — RPR TITER: RPR Titer: 1:128 {titer} — AB

## 2014-08-21 NOTE — Progress Notes (Signed)
That would be oral doxy, If he is scheduled with me soon I can discuss with his wife (rather than pt he will just freak out since he never remembers that he has HIV in first place)

## 2014-08-22 ENCOUNTER — Other Ambulatory Visit: Payer: Self-pay | Admitting: *Deleted

## 2014-08-22 ENCOUNTER — Telehealth: Payer: Self-pay | Admitting: *Deleted

## 2014-08-22 LAB — HIV-1 RNA QUANT-NO REFLEX-BLD: HIV 1 RNA Quant: <20 copies/mL (ref <20)

## 2014-08-22 MED ORDER — DOXYCYCLINE HYCLATE 100 MG PO TABS: 100.0000 mg | ORAL_TABLET | Freq: Two times a day (BID) | ORAL | Status: DC

## 2014-08-22 NOTE — Telephone Encounter (Signed)
Left patient a voicemail to call the clinic Shaun Lutz

## 2014-08-22 NOTE — Telephone Encounter (Signed)
Rx sent patient's partner notified

## 2014-08-22 NOTE — Telephone Encounter (Signed)
-----   Message from Truman Hayward, MD sent at 08/21/2014 11:32 AM EDT ----- Pt needs retreatment with IV PCN but we had not been able to admit him or arrange home IV abx for him, will consider protracted oral doxy

## 2014-08-31 ENCOUNTER — Ambulatory Visit (INDEPENDENT_AMBULATORY_CARE_PROVIDER_SITE_OTHER): Payer: Medicare Other | Admitting: Internal Medicine

## 2014-08-31 ENCOUNTER — Encounter: Payer: Self-pay | Admitting: Internal Medicine

## 2014-08-31 VITALS — BP 189/108 | HR 83 | Ht 68.0 in | Wt 117.2 lb

## 2014-08-31 DIAGNOSIS — I472 Ventricular tachycardia: Secondary | ICD-10-CM

## 2014-08-31 DIAGNOSIS — Z45018 Encounter for adjustment and management of other part of cardiac pacemaker: Secondary | ICD-10-CM | POA: Diagnosis not present

## 2014-08-31 DIAGNOSIS — I4729 Other ventricular tachycardia: Secondary | ICD-10-CM

## 2014-08-31 DIAGNOSIS — I441 Atrioventricular block, second degree: Secondary | ICD-10-CM

## 2014-08-31 LAB — CUP PACEART INCLINIC DEVICE CHECK
Brady Statistic AP VP Percent: 0 %
Brady Statistic AS VS Percent: 47 %
Date Time Interrogation Session: 20160701130822
Lead Channel Impedance Value: 419 Ohm
Lead Channel Impedance Value: 492 Ohm
Lead Channel Pacing Threshold Amplitude: 0.75 V
Lead Channel Pacing Threshold Pulse Width: 0.4 ms
Lead Channel Sensing Intrinsic Amplitude: 2 mV
Lead Channel Sensing Intrinsic Amplitude: 8 mV
Lead Channel Setting Pacing Amplitude: 2 V
Lead Channel Setting Pacing Amplitude: 2.5 V
Lead Channel Setting Sensing Sensitivity: 4 mV
MDC IDC MSMT BATTERY IMPEDANCE: 298 Ohm
MDC IDC MSMT BATTERY REMAINING LONGEVITY: 111 mo
MDC IDC MSMT BATTERY VOLTAGE: 2.79 V
MDC IDC MSMT LEADCHNL RA PACING THRESHOLD PULSEWIDTH: 0.4 ms
MDC IDC MSMT LEADCHNL RV PACING THRESHOLD AMPLITUDE: 0.75 V
MDC IDC SET LEADCHNL RV PACING PULSEWIDTH: 0.4 ms
MDC IDC STAT BRADY AP VS PERCENT: 53 %
MDC IDC STAT BRADY AS VP PERCENT: 0 %

## 2014-08-31 MED ORDER — LISINOPRIL 5 MG PO TABS: 5.0000 mg | ORAL_TABLET | Freq: Every day | ORAL | Status: DC

## 2014-08-31 MED ORDER — ATENOLOL 50 MG PO TABS: 50.0000 mg | ORAL_TABLET | Freq: Every day | ORAL | Status: DC

## 2014-08-31 NOTE — Progress Notes (Signed)
Patient Care Team: Donnamae Jude, MD as PCP - General (Obstetrics and Gynecology) Truman Hayward, MD as PCP - Infectious Diseases (Infectious Diseases)   HPI  Shaun Lutz is a 59 y.o. male Seen In followup for a pacemaker implanted 1/12 because of syncope and second degree heart block.it was done by Dr. Loletha Grayer.; however, he lost his insurance and was no longer able to be followed at [facility]   Cardiac evaluation at that time had demonstrated normal left ventricular function by echo.  The patient denies chest pain, shortness of breath, nocturnal dyspnea, orthopnea or peripheral edema.  There have been no palpitations, lightheadedness or syncope.   Blood pressures have been quite elevated. His lisinopril previously started was discontinued. Review of laboratories demonstrates normal renal function consistently over the last 5 years.        Past Medical History  Diagnosis Date  . Hypertension   . Second degree AV block   . Syncope   . Pacemaker -[employer]tronic     DOI 2012  . SYNCOPE 04/07/2010  . HIV (human immunodeficiency virus infection)   . Neurosyphilis   . HIV dementia     Past Surgical History  Procedure Laterality Date  . Pacemaker insertion      Current Outpatient Prescriptions  Medication Sig Dispense Refill  . aspirin 81 MG tablet Take 1 tablet (81 mg total) by mouth daily. 180 tablet 2  . atenolol (TENORMIN) 25 MG tablet Take 1 tablet (25 mg total) by mouth daily. 30 tablet 0  . dolutegravir (TIVICAY) 50 MG tablet Take 1 tablet (50 mg total) by mouth daily. 30 tablet 2  . doxepin (SINEQUAN) 10 MG capsule Take 1 capsule (10 mg total) by mouth at bedtime. 30 capsule 3  . doxycycline (VIBRA-TABS) 100 MG tablet Take 1 tablet (100 mg total) by mouth 2 (two) times daily. 56 tablet 0  . emtricitabine-tenofovir (TRUVADA) 200-300 MG per tablet Take 1 tablet by mouth daily. 30 tablet 2   No current facility-administered medications for this visit.    Allergies    Allergen Reactions  . Penicillins Hives and Rash    Review of Systems negative except from HPI and PMH  Physical Exam BP 189/108 mmHg  Pulse 83  Ht 5\' 8"  (1.727 m)  Wt 117 lb 3.2 oz (53.162 kg)  BMI 17.82 kg/m2 Well developed and well nourished in no acute distress HENT normal E scleral and icterus clear Neck Supple Clear to ausculation Device pocket well healed; without hematoma or erythema.  There is no tethering   Regular rate and rhythm, no murmurs gallops or rub Soft with active bowel sounds No clubbing cyanosis  Edema Alert and oriented, walks with a walker because of balance Skin Warm and Dry  ECG ordered today demonstrates atrial pacing at 83 Intervals 24/09/40 Otherwise normal  Assessment and plan  Second degree AV block-intermittent   Syncope  Hypertension  Ventricular tachycardia  No intercurrent Ventricular tachycardia  Pacemaker-Medtronic  The patient's device was interrogated.  The information was reviewed. No changes were made in the programming.    Without symptoms. No recurrent syncope.  His blood pressure is significantly elevated. We will plan to increase his atenolol from 25--50 and add lisinopril at 5 mg a day. He is to see his PCP in about a month. I will ask her to follow the metabolic profile  We will see him again in one year's time and continue remote follow-up

## 2014-08-31 NOTE — Patient Instructions (Addendum)
Medication Instructions:  Your physician has recommended you make the following change in your medication:  1) INCREASE Atenolol to 50 mg daily 2) START Lisinopril 5 mg daily  Labwork: None ordered  Testing/Procedures: None ordered  Follow-Up: Your physician recommends that you schedule a follow-up appointment in: one month with your primary care doctor.  Your physician recommends that you schedule a follow-up appointment in: 6 months with Device clinic and in 12 months with Chanetta Marshall, NP.  Any Other Special Instructions Will Be Listed Below (If Applicable). Thank you for choosing [facility]!!

## 2014-09-12 ENCOUNTER — Encounter: Payer: Self-pay | Admitting: Infectious Disease

## 2014-09-12 ENCOUNTER — Ambulatory Visit (INDEPENDENT_AMBULATORY_CARE_PROVIDER_SITE_OTHER): Payer: Medicare Other | Admitting: Infectious Disease

## 2014-09-12 VITALS — BP 171/114 | HR 65 | Temp 97.6°F | Wt 119.5 lb

## 2014-09-12 DIAGNOSIS — A523 Neurosyphilis, unspecified: Secondary | ICD-10-CM | POA: Diagnosis not present

## 2014-09-12 DIAGNOSIS — E785 Hyperlipidemia, unspecified: Secondary | ICD-10-CM | POA: Diagnosis not present

## 2014-09-12 DIAGNOSIS — Z21 Asymptomatic human immunodeficiency virus [HIV] infection status: Secondary | ICD-10-CM

## 2014-09-12 DIAGNOSIS — L568 Other specified acute skin changes due to ultraviolet radiation: Secondary | ICD-10-CM | POA: Insufficient documentation

## 2014-09-12 DIAGNOSIS — B20 Human immunodeficiency virus [HIV] disease: Secondary | ICD-10-CM | POA: Diagnosis present

## 2014-09-12 HISTORY — DX: Other specified acute skin changes due to ultraviolet radiation: L56.8

## 2014-09-12 MED ORDER — SILVER SULFADIAZINE 1 % EX CREA: 1.0000 "application " | TOPICAL_CREAM | Freq: Every day | CUTANEOUS | Status: DC

## 2014-09-12 MED ORDER — EMTRICITABINE-TENOFOVIR AF 200-25 MG PO TABS
1.0000 | ORAL_TABLET | Freq: Every day | ORAL | Status: DC
Start: 2014-09-12 — End: 2014-09-13

## 2014-09-12 MED ORDER — DOLUTEGRAVIR SODIUM 50 MG PO TABS: 50.0000 mg | ORAL_TABLET | Freq: Every day | ORAL | Status: DC

## 2014-09-12 NOTE — Progress Notes (Signed)
Subjective:    Patient ID: Shaun Lutz, male    DOB: 1956-02-12, 59 y.o.   MRN: [id number]  HPI   59 year male with with COPD, Pacemaker (medtronic 2012) frequent falls gait abnormality found to have HIV and Neurosyphilis. He received 13 days of IV therapy.  He had what was thought to be PCN rash and changed to rocephin last 2 days of therapy before pulling out his IV  We were eventually able to get him onto [facility] and Truvada through emergency SPAP  His viral load is nondetectable his CD4 count remains above 300  Lab Results  Component Value Date   HIV1RNAQUANT <20 08/20/2014   Lab Results  Component Value Date   CD4TABS 280* 08/20/2014   CD4TABS 330* 11/30/2013   CD4TABS 380* 10/03/2013   His RPR failed to drop over the first 6 months remaining at 1 512. We had a lumbar puncture performed earlier in October  and CSF profile had an elevated protein but normal white blood cell count one white blood cell count on spinal fluid. CSF VDRL and CSF FTA antibodies WERE BOTH POSITIVE  We brought him in with the intention of treating him for Neurosyphilis via PCN desensitization in the ICU but upon learning that this was the purpose of the visit he became severely agitated and absolutely refused to cooperate with admission.  We never were able to do this or rx as outpatient with IV rocephin so I eventually wrote for doxycycline 100mg  bid x 28 days in likely futile attempt to treat his Neurosyphilis again. Since then he has developed a photosensitivity rash from doxy which now is getting better with low potency corticosteroids and time (he is still taking the doxy)    Review of Systems  Unable to perform ROS Skin: Positive for rash.  Psychiatric/Behavioral: Negative for sleep disturbance.       Objective:   Physical Exam  Constitutional: He appears well-developed and well-nourished.  HENT:  Head: Normocephalic and atraumatic.  Eyes: EOM are normal.  Neck: Normal range of motion.  Neck supple.  Cardiovascular: Normal rate and regular rhythm.   Pulmonary/Chest: Effort normal. No respiratory distress.  Abdominal: Soft. He exhibits no distension.  Neurological: He is alert.  Psychiatric: His speech is delayed and tangential. Thought content is paranoid. Cognition and memory are impaired. He expresses impulsivity and inappropriate judgment. He exhibits abnormal recent memory and abnormal remote memory.  Nursing note and vitals reviewed.    He has vitiligo like skin changes at last appt       Now photosensitivity rash 09/12/14:         Assessment & Plan:   Photosensitivity rash: try to push through with doxy since per wife he is getting better. Avoid sun, ok to try silvadene   HIV:  doing extremely well , continue current regimen but will exchange Truvada for Descovy  Neurosyphilis: [place] THERAPY TO TREAT THIS EFFECITVELY FOR 2ND TIME SO WE WENT WITH DOXY ISTEAD  I have little faith that treatment will offer any ability for [place] to reverse his permanent Neurological deficits, though not being able to properly retreat does mean possibility of worsening Neuro events.  I will NOT push matter at present but recheck labs 6 months post doxy   Dementia, combination of Neurosyphilis +/- HIV dementia: not going to recover his antegrade memory it seems. Will need extensive support from his friend  and caregiver  I spent greater than  25 minutes with the patient including greater than 50% of time in face to face counsel of the patient and in coordination of their care.

## 2014-09-13 ENCOUNTER — Other Ambulatory Visit: Payer: Self-pay | Admitting: *Deleted

## 2014-09-13 DIAGNOSIS — B20 Human immunodeficiency virus [HIV] disease: Secondary | ICD-10-CM

## 2014-09-13 MED ORDER — EMTRICITABINE-TENOFOVIR AF 200-25 MG PO TABS: 1.0000 | ORAL_TABLET | Freq: Every day | ORAL | Status: DC

## 2014-09-13 MED ORDER — DOLUTEGRAVIR SODIUM 50 MG PO TABS: 50.0000 mg | ORAL_TABLET | Freq: Every day | ORAL | Status: DC

## 2014-09-13 NOTE — Telephone Encounter (Signed)
Tivicay and Descovy sent to [facility] by mistake, sending to [place], [place]

## 2014-09-25 ENCOUNTER — Ambulatory Visit (INDEPENDENT_AMBULATORY_CARE_PROVIDER_SITE_OTHER): Payer: Medicare Other | Admitting: Family Medicine

## 2014-09-25 ENCOUNTER — Encounter: Payer: Self-pay | Admitting: Family Medicine

## 2014-09-25 VITALS — BP 144/78 | HR 80 | Temp 98.3°F | Wt 119.0 lb

## 2014-09-25 DIAGNOSIS — A539 Syphilis, unspecified: Secondary | ICD-10-CM

## 2014-09-25 DIAGNOSIS — Z21 Asymptomatic human immunodeficiency virus [HIV] infection status: Secondary | ICD-10-CM | POA: Diagnosis not present

## 2014-09-25 DIAGNOSIS — I10 Essential (primary) hypertension: Secondary | ICD-10-CM | POA: Diagnosis not present

## 2014-09-25 DIAGNOSIS — B20 Human immunodeficiency virus [HIV] disease: Secondary | ICD-10-CM

## 2014-09-25 LAB — BASIC METABOLIC PANEL
BUN: 21 mg/dL (ref 7–25)
CO2: 25 mEq/L (ref 20–31)
Calcium: 9.1 mg/dL (ref 8.6–10.3)
Chloride: 105 mEq/L (ref 98–110)
Creat: 1.06 mg/dL (ref 0.70–1.33)
GLUCOSE: 100 mg/dL — AB (ref 65–99)
Potassium: 5 mEq/L (ref 3.5–5.3)
SODIUM: 139 meq/L (ref 135–146)

## 2014-09-25 NOTE — Progress Notes (Signed)
    Subjective:    Patient ID: Shaun Lutz is a 59 y.o. male presenting with Follow-up  on 09/25/2014  HPI: Reports that he is getting up more.  Taking doxycycline and had a sun reaction.  His BP was very much elevated and he was started on Lisinopril. His BP is improved today.  They are working on eliminating salt from his diet.  Review of Systems  Constitutional: Negative for fever and chills.  Respiratory: Negative for shortness of breath.   Cardiovascular: Negative for leg swelling.  Gastrointestinal: Negative for nausea, vomiting and abdominal pain.      Objective:    BP 144/78 mmHg  Pulse 80  Temp(Src) 98.3 F (36.8 C) (Oral)  Wt 119 lb (53.978 kg) Physical Exam  Constitutional: He appears well-developed and well-nourished. No distress.  HENT:  Head: Normocephalic and atraumatic.  Eyes: No scleral icterus.  Neck: Neck supple.  Cardiovascular: Normal rate.   Pulmonary/Chest: Effort normal.  Abdominal: Soft.  Musculoskeletal: He exhibits no edema.  Neurological: He is alert.  Skin: Skin is warm.  Psychiatric: He has a normal mood and affect.  Vitals reviewed.       Assessment & Plan:   Problem List Items Addressed This Visit      Unprioritized   HIV (human immunodeficiency virus infection) (Chronic)    Continue meds      Syphilis in male    S/p treatment with doxycycline.      Essential hypertension - Primary    Since starting ACE-I, will check BMP today--BP is much better--will continue current meds.      Relevant Orders   Basic Metabolic Panel      Return in about 3 months (around 12/26/2014).  Shaun Lutz S 09/25/2014 9:45 AM

## 2014-09-25 NOTE — Patient Instructions (Signed)
DASH Eating Plan °DASH stands for "Dietary Approaches to Stop Hypertension." The DASH eating plan is a healthy eating plan that has been shown to reduce high blood pressure (hypertension). Additional health benefits may include reducing the risk of type 2 diabetes mellitus, heart disease, and stroke. The DASH eating plan may also help with weight loss. °WHAT DO I NEED TO KNOW ABOUT THE DASH EATING PLAN? °For the DASH eating plan, you will follow these general guidelines: °· Choose foods with a percent daily value for sodium of less than 5% (as listed on the food label). °· Use salt-free seasonings or herbs instead of table salt or sea salt. °· Check with your health care provider or pharmacist before using salt substitutes. °· Eat lower-sodium products, often labeled as "lower sodium" or "no salt added." °· Eat fresh foods. °· Eat more vegetables, fruits, and low-fat dairy products. °· Choose whole grains. Look for the word "whole" as the first word in the ingredient list. °· Choose fish and skinless chicken or turkey more often than red meat. Limit fish, poultry, and meat to 6 oz (170 g) each day. °· Limit sweets, desserts, sugars, and sugary drinks. °· Choose heart-healthy fats. °· Limit cheese to 1 oz (28 g) per day. °· Eat more home-cooked food and less restaurant, buffet, and fast food. °· Limit fried foods. °· Cook foods using methods other than frying. °· Limit canned vegetables. If you do use them, rinse them well to decrease the sodium. °· When eating at a restaurant, ask that your food be prepared with less salt, or no salt if possible. °WHAT FOODS CAN I EAT? °Seek help from a dietitian for individual calorie needs. °Grains °Whole grain or whole wheat bread. Brown rice. Whole grain or whole wheat pasta. Quinoa, bulgur, and whole grain cereals. Low-sodium cereals. Corn or whole wheat flour tortillas. Whole grain cornbread. Whole grain crackers. Low-sodium crackers. °Vegetables °Fresh or frozen vegetables  (raw, steamed, roasted, or grilled). Low-sodium or reduced-sodium tomato and vegetable juices. Low-sodium or reduced-sodium tomato sauce and paste. Low-sodium or reduced-sodium canned vegetables.  °Fruits °All fresh, canned (in natural juice), or frozen fruits. °Meat and Other Protein Products °Ground beef (85% or leaner), grass-fed beef, or beef trimmed of fat. Skinless chicken or turkey. Ground chicken or turkey. Pork trimmed of fat. All fish and seafood. Eggs. Dried beans, peas, or lentils. Unsalted nuts and seeds. Unsalted canned beans. °Dairy °Low-fat dairy products, such as skim or 1% milk, 2% or reduced-fat cheeses, low-fat ricotta or cottage cheese, or plain low-fat yogurt. Low-sodium or reduced-sodium cheeses. °Fats and Oils °Tub margarines without trans fats. Light or reduced-fat mayonnaise and salad dressings (reduced sodium). Avocado. Safflower, olive, or canola oils. Natural peanut or almond butter. °Other °Unsalted popcorn and pretzels. °The items listed above may not be a complete list of recommended foods or beverages. Contact your dietitian for more options. °WHAT FOODS ARE NOT RECOMMENDED? °Grains °White bread. White pasta. White rice. Refined cornbread. Bagels and croissants. Crackers that contain trans fat. °Vegetables °Creamed or fried vegetables. Vegetables in a cheese sauce. Regular canned vegetables. Regular canned tomato sauce and paste. Regular tomato and vegetable juices. °Fruits °Dried fruits. Canned fruit in light or heavy syrup. Fruit juice. °Meat and Other Protein Products °Fatty cuts of meat. Ribs, chicken wings, bacon, sausage, bologna, salami, chitterlings, fatback, hot dogs, bratwurst, and packaged luncheon meats. Salted nuts and seeds. Canned beans with salt. °Dairy °Whole or 2% milk, cream, half-and-half, and cream cheese. Whole-fat or sweetened yogurt. Full-fat   cheeses or blue cheese. Nondairy creamers and whipped toppings. Processed cheese, cheese spreads, or cheese  curds. °Condiments °Onion and garlic salt, seasoned salt, table salt, and sea salt. Canned and packaged gravies. Worcestershire sauce. Tartar sauce. Barbecue sauce. Teriyaki sauce. Soy sauce, including reduced sodium. Steak sauce. Fish sauce. Oyster sauce. Cocktail sauce. Horseradish. Ketchup and mustard. Meat flavorings and tenderizers. Bouillon cubes. Hot sauce. Tabasco sauce. Marinades. Taco seasonings. Relishes. °Fats and Oils °Butter, stick margarine, lard, shortening, ghee, and bacon fat. Coconut, palm kernel, or palm oils. Regular salad dressings. °Other °Pickles and olives. Salted popcorn and pretzels. °The items listed above may not be a complete list of foods and beverages to avoid. Contact your dietitian for more information. °WHERE CAN I FIND MORE INFORMATION? °National [employer], [employer], [employer]: www.nhlbi.nih.gov/health/health-topics/topics/dash/ °Document Released: 02/05/2011 Document Revised: 07/03/2013 Document Reviewed: 12/21/2012 °Exit[employer]® Patient Information ©2015 [employer]. This information is not intended to replace advice given to you by your health care provider. Make sure you discuss any questions you have with your health care provider. ° °

## 2014-09-25 NOTE — Assessment & Plan Note (Signed)
S/p treatment with doxycycline.

## 2014-09-25 NOTE — Assessment & Plan Note (Addendum)
Since starting ACE-I, will check BMP today--BP is much better--will continue current meds.

## 2014-09-25 NOTE — Assessment & Plan Note (Signed)
Continue meds. 

## 2014-09-26 ENCOUNTER — Telehealth: Payer: Self-pay | Admitting: *Deleted

## 2014-09-26 ENCOUNTER — Ambulatory Visit: Payer: Self-pay

## 2014-09-26 ENCOUNTER — Encounter: Payer: Self-pay | Admitting: *Deleted

## 2014-09-26 NOTE — Telephone Encounter (Signed)
Letter mailed to patient. Jazmin Hartsell,CMA  

## 2014-09-26 NOTE — Telephone Encounter (Signed)
-----   Message from Donnamae Jude, MD sent at 09/26/2014  8:14 AM EDT ----- His kidney function is ok--may continue his lisinopril

## 2014-12-05 ENCOUNTER — Ambulatory Visit: Payer: Self-pay | Admitting: Family Medicine

## 2014-12-21 ENCOUNTER — Ambulatory Visit (INDEPENDENT_AMBULATORY_CARE_PROVIDER_SITE_OTHER): Payer: Medicare Other | Admitting: Family Medicine

## 2014-12-21 ENCOUNTER — Encounter: Payer: Self-pay | Admitting: Family Medicine

## 2014-12-21 ENCOUNTER — Other Ambulatory Visit: Payer: Self-pay | Admitting: Family Medicine

## 2014-12-21 VITALS — BP 138/90 | HR 72 | Temp 98.1°F | Ht 68.0 in | Wt 127.0 lb

## 2014-12-21 DIAGNOSIS — Z720 Tobacco use: Secondary | ICD-10-CM | POA: Diagnosis not present

## 2014-12-21 DIAGNOSIS — Z87891 Personal history of nicotine dependence: Secondary | ICD-10-CM

## 2014-12-21 DIAGNOSIS — Z1211 Encounter for screening for malignant neoplasm of colon: Secondary | ICD-10-CM

## 2014-12-21 DIAGNOSIS — F17200 Nicotine dependence, unspecified, uncomplicated: Secondary | ICD-10-CM

## 2014-12-21 DIAGNOSIS — I10 Essential (primary) hypertension: Secondary | ICD-10-CM

## 2014-12-21 DIAGNOSIS — Z21 Asymptomatic human immunodeficiency virus [HIV] infection status: Secondary | ICD-10-CM

## 2014-12-21 DIAGNOSIS — B20 Human immunodeficiency virus [HIV] disease: Secondary | ICD-10-CM

## 2014-12-21 NOTE — Assessment & Plan Note (Signed)
Given 30 pack year history and ongoing use--will check screening LDCT

## 2014-12-21 NOTE — Patient Instructions (Addendum)
DASH Eating Plan DASH stands for "Dietary Approaches to Stop Hypertension." The DASH eating plan is a healthy eating plan that has been shown to reduce high blood pressure (hypertension). Additional health benefits may include reducing the risk of type 2 diabetes mellitus, heart disease, and stroke. The DASH eating plan may also help with weight loss. WHAT DO I NEED TO KNOW ABOUT THE DASH EATING PLAN? For the DASH eating plan, you will follow these general guidelines:  Choose foods with a percent daily value for sodium of less than 5% (as listed on the food label).  Use salt-free seasonings or herbs instead of table salt or sea salt.  Check with your health care provider or pharmacist before using salt substitutes.  Eat lower-sodium products, often labeled as "lower sodium" or "no salt added."  Eat fresh foods.  Eat more vegetables, fruits, and low-fat dairy products.  Choose whole grains. Look for the word "whole" as the first word in the ingredient list.  Choose fish and skinless chicken or turkey more often than red meat. Limit fish, poultry, and meat to 6 oz (170 g) each day.  Limit sweets, desserts, sugars, and sugary drinks.  Choose heart-healthy fats.  Limit cheese to 1 oz (28 g) per day.  Eat more home-cooked food and less restaurant, buffet, and fast food.  Limit fried foods.  Cook foods using methods other than frying.  Limit canned vegetables. If you do use them, rinse them well to decrease the sodium.  When eating at a restaurant, ask that your food be prepared with less salt, or no salt if possible. WHAT FOODS CAN I EAT? Seek help from a dietitian for individual calorie needs. Grains Whole grain or whole wheat bread. Brown rice. Whole grain or whole wheat pasta. Quinoa, bulgur, and whole grain cereals. Low-sodium cereals. Corn or whole wheat flour tortillas. Whole grain cornbread. Whole grain crackers. Low-sodium crackers. Vegetables Fresh or frozen vegetables  (raw, steamed, roasted, or grilled). Low-sodium or reduced-sodium tomato and vegetable juices. Low-sodium or reduced-sodium tomato sauce and paste. Low-sodium or reduced-sodium canned vegetables.  Fruits All fresh, canned (in natural juice), or frozen fruits. Meat and Other Protein Products Ground beef (85% or leaner), grass-fed beef, or beef trimmed of fat. Skinless chicken or turkey. Ground chicken or turkey. Pork trimmed of fat. All fish and seafood. Eggs. Dried beans, peas, or lentils. Unsalted nuts and seeds. Unsalted canned beans. Dairy Low-fat dairy products, such as skim or 1% milk, 2% or reduced-fat cheeses, low-fat ricotta or cottage cheese, or plain low-fat yogurt. Low-sodium or reduced-sodium cheeses. Fats and Oils Tub margarines without trans fats. Light or reduced-fat mayonnaise and salad dressings (reduced sodium). Avocado. Safflower, olive, or canola oils. Natural peanut or almond butter. Other Unsalted popcorn and pretzels. The items listed above may not be a complete list of recommended foods or beverages. Contact your dietitian for more options. WHAT FOODS ARE NOT RECOMMENDED? Grains White bread. White pasta. White rice. Refined cornbread. Bagels and croissants. Crackers that contain trans fat. Vegetables Creamed or fried vegetables. Vegetables in a cheese sauce. Regular canned vegetables. Regular canned tomato sauce and paste. Regular tomato and vegetable juices. Fruits Dried fruits. Canned fruit in light or heavy syrup. Fruit juice. Meat and Other Protein Products Fatty cuts of meat. Ribs, chicken wings, bacon, sausage, bologna, salami, chitterlings, fatback, hot dogs, bratwurst, and packaged luncheon meats. Salted nuts and seeds. Canned beans with salt. Dairy Whole or 2% milk, cream, half-and-half, and cream cheese. Whole-fat or sweetened yogurt. Full-fat   cheeses or blue cheese. Nondairy creamers and whipped toppings. Processed cheese, cheese spreads, or cheese  curds. Condiments Onion and garlic salt, seasoned salt, table salt, and sea salt. Canned and packaged gravies. Worcestershire sauce. Tartar sauce. Barbecue sauce. Teriyaki sauce. Soy sauce, including reduced sodium. Steak sauce. Fish sauce. Oyster sauce. Cocktail sauce. Horseradish. Ketchup and mustard. Meat flavorings and tenderizers. Bouillon cubes. Hot sauce. Tabasco sauce. Marinades. Taco seasonings. Relishes. Fats and Oils Butter, stick margarine, lard, shortening, ghee, and bacon fat. Coconut, palm kernel, or palm oils. Regular salad dressings. Other Pickles and olives. Salted popcorn and pretzels. The items listed above may not be a complete list of foods and beverages to avoid. Contact your dietitian for more information. WHERE CAN I FIND MORE INFORMATION? National Heart, Lung, and Blood Institute: travelstabloid.com   This information is not intended to replace advice given to you by your health care provider. Make sure you discuss any questions you have with your health care provider.   Document Released: 02/05/2011 Document Revised: 03/09/2014 Document Reviewed: 12/21/2012 Elsevier Interactive Patient Education 2016 [employer]. Colonoscopy A colonoscopy is an exam to look at the entire large intestine (colon). This exam can help find problems such as tumors, polyps, inflammation, and areas of bleeding. The exam takes about 1 hour.  LET [facility] CARE PROVIDER KNOW ABOUT:   Any allergies you have.  All medicines you are taking, including vitamins, herbs, eye drops, creams, and over-the-counter medicines.  Previous problems you or members of your family have had with the use of anesthetics.  Any blood disorders you have.  Previous surgeries you have had.  Medical conditions you have. RISKS AND COMPLICATIONS  Generally, this is a safe procedure. However, as with any procedure, complications can occur. Possible complications  include:  Bleeding.  Tearing or rupture of the colon wall.  Reaction to medicines given during the exam.  Infection (rare). BEFORE THE PROCEDURE   Ask your health care provider about changing or stopping your regular medicines.  You may be prescribed an oral bowel prep. This involves drinking a large amount of medicated liquid, starting the day before your procedure. The liquid will cause you to have multiple loose stools until your stool is almost clear or light green. This cleans out your colon in preparation for the procedure.  Do not eat or drink anything else once you have started the bowel prep, unless your health care provider tells you it is safe to do so.  Arrange for someone to drive you home after the procedure. PROCEDURE   You will be given medicine to help you relax (sedative).  You will lie on your side with your knees bent.  A long, flexible tube with a light and camera on the end (colonoscope) will be inserted through the rectum and into the colon. The camera sends video back to a computer screen as it moves through the colon. The colonoscope also releases carbon dioxide gas to inflate the colon. This helps your health care provider see the area better.  During the exam, your health care provider may take a small tissue sample (biopsy) to be examined under a microscope if any abnormalities are found.  The exam is finished when the entire colon has been viewed. AFTER THE PROCEDURE   Do not drive for 24 hours after the exam.  You may have a small amount of blood in your stool.  You may pass moderate amounts of gas and have mild abdominal cramping or bloating. This is caused  by the gas used to inflate your colon during the exam.  Ask when your test results will be ready and how you will get your results. Make sure you get your test results.   This information is not intended to replace advice given to you by your health care provider. Make sure you discuss any  questions you have with your health care provider.   Document Released: 02/14/2000 Document Revised: 12/07/2012 Document Reviewed: 10/24/2012 Elsevier Interactive Patient Education [employer].

## 2014-12-21 NOTE — Progress Notes (Signed)
    Subjective:    Patient ID: Shaun Lutz is a 59 y.o. male presenting with Hypertension  on 12/21/2014  HPI: Here for f/u HTN.  Doing well. Walking well with cane. Declines flu shot.  Not getting out much.  Mood is stable. He is ok with colon cancer screening.  Continues to smoke. Needs screening.  Review of Systems  Constitutional: Negative for fever and chills.  Respiratory: Negative for shortness of breath.   Cardiovascular: Negative for leg swelling.  Gastrointestinal: Negative for nausea, vomiting and abdominal pain.      Objective:    BP 138/90 mmHg  Pulse 72  Temp(Src) 98.1 F (36.7 C) (Oral)  Ht 5\' 8"  (1.727 m)  Wt 127 lb (57.607 kg)  BMI 19.31 kg/m2 Physical Exam  Constitutional: He appears well-developed and well-nourished. No distress.  HENT:  Head: Normocephalic and atraumatic.  Eyes: No scleral icterus.  Neck: Neck supple.  Cardiovascular: Normal rate.   Pulmonary/Chest: Effort normal.  Abdominal: Soft.  Musculoskeletal: He exhibits no edema.  Neurological: He is alert.  Skin: Skin is warm.  Psychiatric: He has a normal mood and affect.  Vitals reviewed.  BMET    Component Value Date/Time   NA 139 09/25/2014 0955   K 5.0 09/25/2014 0955   CL 105 09/25/2014 0955   CO2 25 09/25/2014 0955   GLUCOSE 100* 09/25/2014 0955   BUN 21 09/25/2014 0955   CREATININE 1.06 09/25/2014 0955   CREATININE 0.75 06/23/2013 0600   CALCIUM 9.1 09/25/2014 0955   GFRNONAA >89 08/20/2014 0910   GFRNONAA >90 06/23/2013 0600   GFRAA >89 08/20/2014 0910   GFRAA >90 06/23/2013 0600         Assessment & Plan:   Problem List Items Addressed This Visit      Unprioritized   HIV (human immunodeficiency virus infection) ([place]) (Chronic)    Continue meds--has f/u with ID      Tobacco abuse - Primary    Given 30 pack year history and ongoing use--will check screening LDCT      Essential hypertension    BP is well controlled on meds.  Last BMP is WNL--no bump in  Cr.       Other Visit Diagnoses    Screen for colon cancer        Relevant Orders    Ambulatory referral to Gastroenterology    History of smoking 30 or more pack years           Return in about 3 months (around 03/23/2015) for a follow-up.  Glenys Snader S 12/21/2014 8:44 AM

## 2014-12-21 NOTE — Assessment & Plan Note (Signed)
BP is well controlled on meds.  Last BMP is WNL--no bump in Cr.

## 2014-12-21 NOTE — Assessment & Plan Note (Signed)
Continue meds--has f/u with ID

## 2014-12-28 ENCOUNTER — Ambulatory Visit
Admission: RE | Admit: 2014-12-28 | Discharge: 2014-12-28 | Disposition: A | Discharge status: Home / Self Care | Payer: Medicare Other | Source: Ambulatory Visit | Attending: Family Medicine | Admitting: Family Medicine

## 2014-12-28 DIAGNOSIS — J439 Emphysema, unspecified: Secondary | ICD-10-CM | POA: Diagnosis not present

## 2014-12-28 DIAGNOSIS — F17200 Nicotine dependence, unspecified, uncomplicated: Secondary | ICD-10-CM

## 2014-12-28 DIAGNOSIS — F17210 Nicotine dependence, cigarettes, uncomplicated: Secondary | ICD-10-CM | POA: Diagnosis not present

## 2014-12-28 DIAGNOSIS — Z87891 Personal history of nicotine dependence: Secondary | ICD-10-CM

## 2015-01-07 ENCOUNTER — Other Ambulatory Visit: Payer: Medicare Other

## 2015-01-07 DIAGNOSIS — E785 Hyperlipidemia, unspecified: Secondary | ICD-10-CM

## 2015-01-07 DIAGNOSIS — Z21 Asymptomatic human immunodeficiency virus [HIV] infection status: Secondary | ICD-10-CM | POA: Diagnosis not present

## 2015-01-07 DIAGNOSIS — B20 Human immunodeficiency virus [HIV] disease: Secondary | ICD-10-CM

## 2015-01-07 LAB — COMPLETE METABOLIC PANEL WITH GFR
ALBUMIN: 4.1 g/dL (ref 3.6–5.1)
ALT: 8 U/L — AB (ref 9–46)
AST: 12 U/L (ref 10–35)
Alkaline Phosphatase: 73 U/L (ref 40–115)
BILIRUBIN TOTAL: 0.5 mg/dL (ref 0.2–1.2)
BUN: 18 mg/dL (ref 7–25)
CALCIUM: 8.8 mg/dL (ref 8.6–10.3)
CHLORIDE: 103 mmol/L (ref 98–110)
CO2: 27 mmol/L (ref 20–31)
CREATININE: 0.89 mg/dL (ref 0.70–1.33)
GFR, Est African American: >89 mL/min (ref >=60)
GFR, Est Non African American: >89 mL/min (ref >=60)
Glucose, Bld: 97 mg/dL (ref 65–99)
Potassium: 5.1 mmol/L (ref 3.5–5.3)
Sodium: 139 mmol/L (ref 135–146)
TOTAL PROTEIN: 7.4 g/dL (ref 6.1–8.1)

## 2015-01-07 LAB — CBC WITH DIFFERENTIAL/PLATELET
Basophils Absolute: 0.1 10*3/uL (ref 0.0–0.1)
Basophils Relative: 1 % (ref 0–1)
EOS PCT: 8 % — AB (ref 0–5)
Eosinophils Absolute: 0.5 10*3/uL (ref 0.0–0.7)
HEMATOCRIT: 40.5 % (ref 39.0–52.0)
Hemoglobin: 13.6 g/dL (ref 13.0–17.0)
LYMPHS PCT: 28 % (ref 12–46)
Lymphs Abs: 1.8 10*3/uL (ref 0.7–4.0)
MCH: 32.6 pg (ref 26.0–34.0)
MCHC: 33.6 g/dL (ref 30.0–36.0)
MCV: 97.1 fL (ref 78.0–100.0)
MONO ABS: 0.4 10*3/uL (ref 0.1–1.0)
MPV: 9.9 fL (ref 8.6–12.4)
Monocytes Relative: 7 % (ref 3–12)
Neutro Abs: 3.6 10*3/uL (ref 1.7–7.7)
Neutrophils Relative %: 56 % (ref 43–77)
Platelets: 278 10*3/uL (ref 150–400)
RBC: 4.17 MIL/uL — AB (ref 4.22–5.81)
RDW: 13.2 % (ref 11.5–15.5)
WBC: 6.4 10*3/uL (ref 4.0–10.5)

## 2015-01-07 LAB — LIPID PANEL
Cholesterol: 143 mg/dL (ref 125–200)
HDL: 46 mg/dL (ref >=40)
LDL CALC: 84 mg/dL (ref <130)
TRIGLYCERIDES: 63 mg/dL (ref <150)
Total CHOL/HDL Ratio: 3.1 Ratio (ref <=5.0)
VLDL: 13 mg/dL (ref <30)

## 2015-01-08 LAB — HIV-1 RNA QUANT-NO REFLEX-BLD: HIV-1 RNA Quant, Log: <1.3 Log copies/mL (ref <1.30)

## 2015-01-08 LAB — T-HELPER CELL (CD4) - (RCID CLINIC ONLY)
CD4 % Helper T Cell: 22 % — ABNORMAL LOW (ref 33–55)
CD4 T Cell Abs: 400 /uL (ref 400–2700)

## 2015-01-08 LAB — FLUORESCENT TREPONEMAL AB(FTA)-IGG-BLD: FLUORESCENT TREPONEMAL ABS: REACTIVE — AB

## 2015-01-08 LAB — RPR TITER

## 2015-01-08 LAB — RPR: RPR: REACTIVE — AB

## 2015-01-28 ENCOUNTER — Encounter: Payer: Self-pay | Admitting: Infectious Disease

## 2015-01-28 ENCOUNTER — Ambulatory Visit (INDEPENDENT_AMBULATORY_CARE_PROVIDER_SITE_OTHER): Payer: Medicare Other | Admitting: Infectious Disease

## 2015-01-28 VITALS — BP 132/96 | HR 64 | Temp 97.5°F | Ht 68.0 in | Wt 124.0 lb

## 2015-01-28 DIAGNOSIS — Z21 Asymptomatic human immunodeficiency virus [HIV] infection status: Secondary | ICD-10-CM

## 2015-01-28 DIAGNOSIS — F03918 Unspecified dementia, unspecified severity, with other behavioral disturbance: Secondary | ICD-10-CM

## 2015-01-28 DIAGNOSIS — B20 Human immunodeficiency virus [HIV] disease: Secondary | ICD-10-CM

## 2015-01-28 DIAGNOSIS — A523 Neurosyphilis, unspecified: Secondary | ICD-10-CM | POA: Diagnosis not present

## 2015-01-28 DIAGNOSIS — I10 Essential (primary) hypertension: Secondary | ICD-10-CM | POA: Diagnosis not present

## 2015-01-28 DIAGNOSIS — F0391 Unspecified dementia with behavioral disturbance: Secondary | ICD-10-CM | POA: Diagnosis not present

## 2015-01-28 NOTE — Progress Notes (Signed)
Chief complaint: has been irritable and per wife they have been arguing with eachother more recently  Subjective:    Patient ID: Shaun Lutz, male    DOB: 14-Mar-1955, 59 y.o.   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 He received 13 days of IV therapy.   He had what was thought to be PCN rash and changed to rocephin last 2 days of therapy before pulling out his IV  We were eventually able to get him onto TIVICAY and Truvada through emergency SPAP and now on Tivicay and Descovy  His viral load is nondetectable his CD4 count remains above 300,  He never would agree to retreatment with IV PCN or ceftriaxone for Neurosyphilis but we did give him 2 months of doxycyline and RPR has now come down another two fold.  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  Past Medical History  Diagnosis Date  . Hypertension   . Second degree AV block   . Syncope   . Pacemaker -Medtronic     DOI 2012  . SYNCOPE 04/07/2010  . HIV (human immunodeficiency virus infection) ([place])   . Neurosyphilis   . HIV dementia ([place])   . Photosensitive contact dermatitis 09/12/2014    Past Surgical History  Procedure Laterality Date  . Pacemaker insertion      Family History  Problem Relation Age of Onset  . Cancer Mother   . Heart disease Father   . Heart disease Sister   . Heart disease Brother       Social History   Social History  . Marital Status: Divorced    Spouse Name: N/A  . Number of Children: N/A  . Years of Education: N/A   Social History Main Topics  . Smoking status: Current Every Day Smoker -- 0.25 packs/day for 35 years    Types: Cigarettes  . Smokeless tobacco: None  . Alcohol Use: No  . Drug Use: No  . Sexual Activity: Not Asked   Other Topics Concern  . None   Social History Narrative    Allergies  Allergen Reactions    . Penicillins Hives and Rash     Current outpatient prescriptions:  .  aspirin 81 MG tablet, Take 1 tablet (81 mg total) by mouth daily., Disp: 180 tablet, Rfl: 2 .  atenolol (TENORMIN) 50 MG tablet, Take 1 tablet (50 mg total) by mouth daily., Disp: 30 tablet, Rfl: 11 .  dolutegravir (TIVICAY) 50 MG tablet, Take 1 tablet (50 mg total) by mouth daily., Disp: 30 tablet, Rfl: 11 .  emtricitabine-tenofovir AF (DESCOVY) 200-25 MG per tablet, Take 1 tablet by mouth daily., Disp: 30 tablet, Rfl: 11 .  lisinopril (PRINIVIL,ZESTRIL) 5 MG tablet, Take 1 tablet (5 mg total) by mouth daily., Disp: 30 tablet, Rfl: 11 .  silver sulfADIAZINE (SILVADENE) 1 % cream, Apply 1 application topically daily., Disp: 50 g, Rfl: 1   Lab Results  Component Value Date   HIV1RNAQUANT <20 01/07/2015   Lab Results  Component Value Date   CD4TABS 400 01/07/2015   CD4TABS 280* 08/20/2014   CD4TABS 330* 11/30/2013   His RPR failed to drop over the first 6 months remaining at 1 512. We had a lumbar puncture performed earlier in October  and CSF profile had an elevated protein but normal white blood cell count one white blood cell count  on spinal fluid. CSF VDRL and CSF FTA antibodies WERE BOTH POSITIVE  We brought him in with the intention of treating him for Neurosyphilis via PCN desensitization in the ICU but upon learning that this was the purpose of the visit he became severely agitated and absolutely refused to cooperate with admission.  We never were able to do this or rx as outpatient with IV rocephin so I eventually wrote for doxycycline 100mg  bid x 28 days in likely futile attempt to treat his Neurosyphilis again. Since then he has developed a photosensitivity rash from doxy which now is getting better with low potency corticosteroids and time (he is still taking the doxy)    Review of Systems  Unable to perform ROS Skin: Positive for rash.  Psychiatric/Behavioral: Negative for sleep disturbance.        Objective:   Physical Exam  Constitutional: He appears well-developed and well-nourished.  HENT:  Head: Normocephalic and atraumatic.  Eyes: EOM are normal.  Neck: Normal range of motion. Neck supple.  Cardiovascular: Normal rate and regular rhythm.   Pulmonary/Chest: Effort normal. No respiratory distress.  Abdominal: Soft. He exhibits no distension.  Neurological: He is alert.  Psychiatric: His speech is delayed. He is slowed and withdrawn. Cognition and memory are impaired. He expresses inappropriate judgment. He exhibits abnormal recent memory and abnormal remote memory.  Nursing note and vitals reviewed.    He has vitiligo like skin changes at last appt       Now photosensitivity rash 09/12/14:         Assessment & Plan:    HIV:  doing extremely well , continue current regimen of Tivicay and Descovy  Neurosyphilis: HAVE BEEN UNABLE TO GET HIM ON EFFECTIVE PARETERAL THERAPY TO TREAT THIS EFFECITVELY FOR 2ND TIME SO WE WENT WITH DOXY ISTEAD and titers have come down slightly  I have little faith that treatment will offer any ability for [place] to reverse his permanent Neurological deficits, though not being able to properly retreat does mean possibility of worsening Neuro events.  Dementia, combination of Neurosyphilis +/- HIV dementia: not going to recover his antegrade memory it seems. Will need extensive support from his friend  and caregiver  HTN: encouraged pt and wife to have his BP checked at home since was sig higher here today vs than with PCP Dr. Kennon Rounds  I spent greater than 25 minutes with the patient including greater than 50% of time in face to face counsel of the patient and wife, re his HIV, Neurosyphilis, antegrade amnesia, dementia with behavioral disturbance, HTN and in coordination of his care.

## 2015-01-28 NOTE — Patient Instructions (Signed)
APPOINTMENT WITH [facility] IN ONE MONTH FOR SPAP--THIS IS CRITICALLY IMPORTANT!! AND HAS TO BE DONE EVERY SIX MONTHS  APPT WITH LABS BEFORE HAND WITH DR. VAN DAM IN 3 MONTHS

## 2015-02-19 ENCOUNTER — Encounter (HOSPITAL_COMMUNITY): Payer: Self-pay | Admitting: *Deleted

## 2015-02-27 ENCOUNTER — Ambulatory Visit: Payer: Self-pay

## 2015-02-27 ENCOUNTER — Encounter: Payer: Self-pay | Admitting: Internal Medicine

## 2015-02-27 ENCOUNTER — Ambulatory Visit (INDEPENDENT_AMBULATORY_CARE_PROVIDER_SITE_OTHER): Payer: Medicare Other | Admitting: *Deleted

## 2015-02-27 DIAGNOSIS — I441 Atrioventricular block, second degree: Secondary | ICD-10-CM | POA: Diagnosis not present

## 2015-02-27 DIAGNOSIS — Z950 Presence of cardiac pacemaker: Secondary | ICD-10-CM

## 2015-02-27 LAB — CUP PACEART INCLINIC DEVICE CHECK
Battery Impedance: 322 Ohm
Brady Statistic AP VP Percent: 1 %
Brady Statistic AP VS Percent: 78 %
Brady Statistic AS VP Percent: 0 %
Brady Statistic AS VS Percent: 21 %
Implantable Lead Implant Date: 20120126
Implantable Lead Location: 753859
Lead Channel Impedance Value: 425 Ohm
Lead Channel Impedance Value: 480 Ohm
Lead Channel Pacing Threshold Amplitude: 0.75 V
Lead Channel Pacing Threshold Pulse Width: 0.4 ms
Lead Channel Pacing Threshold Pulse Width: 0.4 ms
Lead Channel Pacing Threshold Pulse Width: 0.4 ms
Lead Channel Sensing Intrinsic Amplitude: 8 mV
Lead Channel Setting Pacing Amplitude: 2 V
Lead Channel Setting Pacing Pulse Width: 0.4 ms
MDC IDC LEAD IMPLANT DT: 20120126
MDC IDC LEAD LOCATION: 753860
MDC IDC MSMT BATTERY REMAINING LONGEVITY: 105 mo
MDC IDC MSMT BATTERY VOLTAGE: 2.79 V
MDC IDC MSMT LEADCHNL RA PACING THRESHOLD AMPLITUDE: 0.625 V
MDC IDC MSMT LEADCHNL RA PACING THRESHOLD AMPLITUDE: 0.75 V
MDC IDC MSMT LEADCHNL RA PACING THRESHOLD PULSEWIDTH: 0.4 ms
MDC IDC MSMT LEADCHNL RV PACING THRESHOLD AMPLITUDE: 0.625 V
MDC IDC SESS DTM: 20161228092027
MDC IDC SET LEADCHNL RV PACING AMPLITUDE: 2.5 V
MDC IDC SET LEADCHNL RV SENSING SENSITIVITY: 2.8 mV

## 2015-02-27 NOTE — Progress Notes (Signed)
Pacemaker check in clinic. Normal device function. Thresholds, sensing, impedances consistent with previous measurements. Device programmed to maximize longevity. 14 mode switches, <0.1%, longest 2hr15min, max-A >400bpm + ASA 81mg . 16 high ventricular rates noted---longest w/ EGM 16 beats, max-V 192bpm. Device programmed at appropriate safety margins. Histogram distribution appropriate for patient activity level. Device programmed to optimize intrinsic conduction. Estimated longevity 8.24yrs. ROV w/ AS in 57mo.

## 2015-03-06 ENCOUNTER — Ambulatory Visit (HOSPITAL_COMMUNITY): Payer: Medicare Other | Admitting: Certified Registered Nurse Anesthetist

## 2015-03-06 ENCOUNTER — Encounter (HOSPITAL_COMMUNITY): Payer: Self-pay | Admitting: *Deleted

## 2015-03-06 ENCOUNTER — Ambulatory Visit (HOSPITAL_COMMUNITY)
Admission: RE | Admit: 2015-03-06 | Discharge: 2015-03-06 | Disposition: A | Discharge status: Home / Self Care | Payer: Medicare Other | Source: Ambulatory Visit | Attending: Gastroenterology | Admitting: Gastroenterology

## 2015-03-06 ENCOUNTER — Encounter (HOSPITAL_COMMUNITY)
Admission: RE | Disposition: A | Discharge status: Home / Self Care | Payer: Self-pay | Source: Ambulatory Visit | Attending: Gastroenterology

## 2015-03-06 DIAGNOSIS — K644 Residual hemorrhoidal skin tags: Secondary | ICD-10-CM | POA: Diagnosis not present

## 2015-03-06 DIAGNOSIS — F17210 Nicotine dependence, cigarettes, uncomplicated: Secondary | ICD-10-CM | POA: Diagnosis not present

## 2015-03-06 DIAGNOSIS — D122 Benign neoplasm of ascending colon: Secondary | ICD-10-CM | POA: Diagnosis not present

## 2015-03-06 DIAGNOSIS — Z950 Presence of cardiac pacemaker: Secondary | ICD-10-CM | POA: Insufficient documentation

## 2015-03-06 DIAGNOSIS — H9193 Unspecified hearing loss, bilateral: Secondary | ICD-10-CM | POA: Insufficient documentation

## 2015-03-06 DIAGNOSIS — Z79899 Other long term (current) drug therapy: Secondary | ICD-10-CM | POA: Insufficient documentation

## 2015-03-06 DIAGNOSIS — I441 Atrioventricular block, second degree: Secondary | ICD-10-CM | POA: Insufficient documentation

## 2015-03-06 DIAGNOSIS — J449 Chronic obstructive pulmonary disease, unspecified: Secondary | ICD-10-CM | POA: Insufficient documentation

## 2015-03-06 DIAGNOSIS — K635 Polyp of colon: Secondary | ICD-10-CM | POA: Diagnosis not present

## 2015-03-06 DIAGNOSIS — F0390 Unspecified dementia without behavioral disturbance: Secondary | ICD-10-CM | POA: Insufficient documentation

## 2015-03-06 DIAGNOSIS — B20 Human immunodeficiency virus [HIV] disease: Secondary | ICD-10-CM | POA: Insufficient documentation

## 2015-03-06 DIAGNOSIS — Z7982 Long term (current) use of aspirin: Secondary | ICD-10-CM | POA: Insufficient documentation

## 2015-03-06 DIAGNOSIS — I10 Essential (primary) hypertension: Secondary | ICD-10-CM | POA: Diagnosis not present

## 2015-03-06 DIAGNOSIS — D123 Benign neoplasm of transverse colon: Secondary | ICD-10-CM | POA: Diagnosis not present

## 2015-03-06 DIAGNOSIS — Z1211 Encounter for screening for malignant neoplasm of colon: Secondary | ICD-10-CM | POA: Diagnosis present

## 2015-03-06 DIAGNOSIS — G20 Parkinson's disease: Secondary | ICD-10-CM | POA: Diagnosis not present

## 2015-03-06 HISTORY — PX: COLONOSCOPY WITH PROPOFOL: SHX5780

## 2015-03-06 SURGERY — COLONOSCOPY WITH PROPOFOL
Anesthesia: Monitor Anesthesia Care

## 2015-03-06 MED ORDER — PROPOFOL 500 MG/50ML IV EMUL
INTRAVENOUS | Status: DC | PRN
  Administered 2015-03-06: 250 ug/kg/min via INTRAVENOUS

## 2015-03-06 MED ORDER — LACTATED RINGERS IV SOLN: INTRAVENOUS | Status: DC

## 2015-03-06 MED ORDER — FENTANYL CITRATE (PF) 100 MCG/2ML IJ SOLN: 25.0000 ug | INTRAMUSCULAR | Status: DC | PRN

## 2015-03-06 MED ORDER — LACTATED RINGERS IV SOLN
INTRAVENOUS | Status: DC
  Administered 2015-03-06: 1000 mL via INTRAVENOUS

## 2015-03-06 MED ORDER — ONDANSETRON HCL 4 MG/2ML IJ SOLN
INTRAMUSCULAR | Status: AC
  Filled 2015-03-06: qty 2

## 2015-03-06 MED ORDER — SODIUM CHLORIDE 0.9 % IV SOLN: INTRAVENOUS | Status: DC

## 2015-03-06 MED ORDER — ONDANSETRON HCL 4 MG/2ML IJ SOLN
INTRAMUSCULAR | Status: DC | PRN
  Administered 2015-03-06: 4 mg via INTRAVENOUS

## 2015-03-06 MED ORDER — PROPOFOL 10 MG/ML IV BOLUS
INTRAVENOUS | Status: AC
  Filled 2015-03-06: qty 40

## 2015-03-06 SURGICAL SUPPLY — 21 items

## 2015-03-06 NOTE — H&P (Signed)
Patient interval history reviewed.  Patient examined again.  There has been no change from documented H/P dated 01/29/16 (scanned into chart from our office) except as documented above.  Assessment:  1.  Colon cancer screening  Plan:  1.  Colonoscopy. 2.  Risks (bleeding, infection, bowel perforation that could require surgery, sedation-related changes in cardiopulmonary systems), benefits (identification and possible treatment of source of symptoms, exclusion of certain causes of symptoms), and alternatives (watchful waiting, radiographic imaging studies, empiric medical treatment) of colonoscopy were explained to patient/family in detail and patient wishes to proceed.

## 2015-03-06 NOTE — Anesthesia Postprocedure Evaluation (Signed)
Anesthesia Post Note  Patient: Shaun Lutz  Procedure(s) Performed: Procedure(s) (LRB): COLONOSCOPY WITH PROPOFOL (N/A)  Patient location during evaluation: PACU Anesthesia Type: MAC Level of consciousness: awake and alert Pain management: pain level controlled Vital Signs Assessment: post-procedure vital signs reviewed and stable Respiratory status: spontaneous breathing, nonlabored ventilation, respiratory function stable and patient connected to nasal cannula oxygen Cardiovascular status: blood pressure returned to baseline and stable Postop Assessment: no signs of nausea or vomiting Anesthetic complications: no    Last Vitals:  Filed Vitals:   03/06/15 0930 03/06/15 0940  BP: 153/90 173/103  Pulse: 62 61  Temp:    Resp: 17 16    Last Pain: There were no vitals filed for this visit.               Basya Casavant L

## 2015-03-06 NOTE — Anesthesia Preprocedure Evaluation (Addendum)
Anesthesia Evaluation  Patient identified by MRN, date of birth, ID band Patient awake    Reviewed: Allergy & Precautions, H&P , NPO status , Patient's Chart, lab work & pertinent test results, reviewed documented beta blocker date and time   Airway Mallampati: II  TM Distance: >3 FB Neck ROM: full    Dental  (+) Dental Advisory Given, Poor Dentition, Missing All front teeth missing. Rest are rotten:   Pulmonary COPD, Current Smoker,    Pulmonary exam normal breath sounds clear to auscultation       Cardiovascular Exercise Tolerance: Good hypertension, Pt. on home beta blockers and Pt. on medications Normal cardiovascular exam+ pacemaker  Rhythm:regular Rate:Normal  Syncope. Second degree AV block. pacer   Neuro/Psych Depression Behavior issuesNeurosyphilis. dementia negative neurological ROS     GI/Hepatic negative GI ROS, Neg liver ROS,   Endo/Other  negative endocrine ROS  Renal/GU negative Renal ROS  negative genitourinary   Musculoskeletal   Abdominal   Peds  Hematology  (+) HIV,   Anesthesia Other Findings   Reproductive/Obstetrics negative OB ROS                            Anesthesia Physical Anesthesia Plan  ASA: III  Anesthesia Plan: MAC   Post-op Pain Management:    Induction:   Airway Management Planned:   Additional Equipment:   Intra-op Plan:   Post-operative Plan:   Informed Consent: I have reviewed the patients History and Physical, chart, labs and discussed the procedure including the risks, benefits and alternatives for the proposed anesthesia with the patient or authorized representative who has indicated his/her understanding and acceptance.   Dental Advisory Given  Plan Discussed with: CRNA and Surgeon  Anesthesia Plan Comments:         Anesthesia Quick Evaluation

## 2015-03-06 NOTE — Discharge Instructions (Signed)
Colonoscopy, Care After °Refer to this sheet in the next few weeks. These instructions provide you with information on caring for yourself after your procedure. Your health care provider may also give you more specific instructions. Your treatment has been planned according to current medical practices, but problems sometimes occur. Call your health care provider if you have any problems or questions after your procedure. °WHAT TO EXPECT AFTER THE PROCEDURE  °After your procedure, it is typical to have the following: °· A small amount of blood in your stool. °· Moderate amounts of gas and mild abdominal cramping or bloating. °HOME CARE INSTRUCTIONS °· Do not drive, operate machinery, or sign important documents for 24 hours. °· You may shower and resume your regular physical activities, but move at a slower pace for the first 24 hours. °· Take frequent rest periods for the first 24 hours. °· Walk around or put a warm pack on your abdomen to help reduce abdominal cramping and bloating. °· Drink enough fluids to keep your urine clear or pale yellow. °· You may resume your normal diet as instructed by your health care provider. Avoid heavy or fried foods that are hard to digest. °· Avoid drinking alcohol for 24 hours or as instructed by your health care provider. °· Only take over-the-counter or prescription medicines as directed by your health care provider. °· If a tissue sample (biopsy) was taken during your procedure: °¨ Do not take aspirin or blood thinners for 7 days, or as instructed by your health care provider. °¨ Do not drink alcohol for 7 days, or as instructed by your health care provider. °¨ Eat soft foods for the first 24 hours. °SEEK MEDICAL CARE IF: °You have persistent spotting of blood in your stool 2-3 days after the procedure. °SEEK IMMEDIATE MEDICAL CARE IF: °· You have more than a small spotting of blood in your stool. °· You pass large blood clots in your stool. °· Your abdomen is swollen  (distended). °· You have nausea or vomiting. °· You have a fever. °· You have increasing abdominal pain that is not relieved with medicine. °  °This information is not intended to replace advice given to you by your health care provider. Make sure you discuss any questions you have with your health care provider. °  °Document Released: 10/01/2003 Document Revised: 12/07/2012 Document Reviewed: 10/24/2012 °Elsevier Interactive Patient Education ©2016 Elsevier Inc. ° °

## 2015-03-06 NOTE — Transfer of Care (Signed)
Immediate Anesthesia Transfer of Care Note  Patient: Shaun Lutz  Procedure(s) Performed: Procedure(s): COLONOSCOPY WITH PROPOFOL (N/A)  Patient Location: PACU Immediate Anesthesia Transfer of Care Note  Patient: Shaun Lutz  Procedure(s) Performed: Procedure(s): COLONOSCOPY WITH PROPOFOL (N/A)  Patient Location: PACU  Anesthesia Type:MAC  Level of Consciousness:  sedated, patient cooperative and responds to stimulation  Airway & Oxygen Therapy:Patient Spontanous Breathing and Patient connected to face mask oxgen  Post-op Assessment:  Report given to PACU RN and Post -op Vital signs reviewed and stable  Post vital signs:  Reviewed and stable  Last Vitals:  Filed Vitals:   03/06/15 0720  BP: 168/98  Pulse: 66  Temp: 36.6 C  Resp: 15    Complications: No apparent anesthesia complications Anesthesia Type:MAC  Level of Consciousness: awake, alert  and oriented  Airway & Oxygen Therapy: Patient Spontanous Breathing and Patient connected to face mask oxygen  Post-op Assessment: Report given to RN and Post -op Vital signs reviewed and stable  Post vital signs: Reviewed and stable  Last Vitals:  Filed Vitals:   03/06/15 0720  BP: 168/98  Pulse: 66  Temp: 36.6 C  Resp: 15    Complications: No apparent anesthesia complications

## 2015-03-06 NOTE — Op Note (Signed)
[facility] [place], [address]   COLONOSCOPY PROCEDURE REPORT  PATIENT: Shaun, Lutz  MR#: [id number] BIRTHDATE: [birth year] , 61  yrs. old GENDER: male ENDOSCOPIST: Arta Silence, MD REFERRED [id number] Pratt, M.D. PROCEDURE DATE:  2015-04-05 PROCEDURE:   Colonoscopy, screening ASA CLASS:   Class III INDICATIONS:average-risk colon cancer screening. MEDICATIONS: Monitored anesthesia care  DESCRIPTION OF PROCEDURE:   After the risks benefits and alternatives of the procedure were thoroughly explained, informed consent was obtained.  Digital rectal exam revealed no abnormalities of the rectum.   The pediatric colonoscope was introduced through the anus and advanced to the cecum, which was identified by both the appendix and ileocecal valve. No adverse events experienced.   The quality of the prep was good.  The instrument was then slowly withdrawn as the colon was fully examined. Estimated blood loss is zero unless otherwise noted in this procedure report.    Findings:  Mild external hemorrhoids, otherwise normal digital rectal exam.  Prep quality was good.  15mm sessile ascending colon polyp and  40mm pedunculated transverse colon polyps, both removed with snare cautery.  No other polyps, masses, vascular ectasias or inflammatory changes were seen.  No diverticula evident.  Normal retroflexed view of rectum.  Withdrawal time was 13 minutes. Withdrawal time was   .  The scope was withdrawn and the procedure completed.  COMPLICATIONS: None immediate.  ENDOSCOPIC IMPRESSION:     As above.  Couple small colon polyps were removed.  RECOMMENDATIONS:     1.  Watch for potential complications of procedure. 2.  Await polypectomy results. 3.  Repeat colonoscopy in 5-10 years, pending patient's state-of-health and pending polypectomy results. 4.  Follow-up with [facility] on as-needed basis.  eSigned:  Arta Silence, MD 04-05-2015 9:21  AM   cc:  CPT CODES: ICD CODES:  The ICD and CPT codes recommended by this software are interpretations from the data that the clinical staff has captured with the software.  The verification of the translation of this report to the ICD and CPT codes and modifiers is the sole responsibility of the health care institution and practicing physician where this report was generated.  [employer]. will not be held responsible for the validity of the ICD and CPT codes included on this report.  AMA assumes no liability for data contained or not contained herein. CPT is a Designer, television/film set of the [employer].

## 2015-03-07 ENCOUNTER — Encounter (HOSPITAL_COMMUNITY): Payer: Self-pay | Admitting: Gastroenterology

## 2015-03-18 ENCOUNTER — Ambulatory Visit: Payer: Medicare Other

## 2015-04-08 ENCOUNTER — Ambulatory Visit (INDEPENDENT_AMBULATORY_CARE_PROVIDER_SITE_OTHER): Payer: Medicare Other | Admitting: Family Medicine

## 2015-04-08 ENCOUNTER — Encounter: Payer: Self-pay | Admitting: Family Medicine

## 2015-04-08 ENCOUNTER — Telehealth: Payer: Self-pay | Admitting: Family Medicine

## 2015-04-08 VITALS — BP 170/100 | HR 80 | Temp 98.6°F | Wt 127.0 lb

## 2015-04-08 DIAGNOSIS — I10 Essential (primary) hypertension: Secondary | ICD-10-CM

## 2015-04-08 DIAGNOSIS — R269 Unspecified abnormalities of gait and mobility: Secondary | ICD-10-CM

## 2015-04-08 MED ORDER — LISINOPRIL 10 MG PO TABS: 10.0000 mg | ORAL_TABLET | Freq: Every day | ORAL | Status: DC

## 2015-04-08 MED ORDER — ATENOLOL 50 MG PO TABS: 50.0000 mg | ORAL_TABLET | Freq: Every day | ORAL | Status: DC

## 2015-04-08 MED ORDER — LISINOPRIL 10 MG PO TABS
10.0000 mg | ORAL_TABLET | Freq: Every day | ORAL | Status: DC
Start: 2015-04-08 — End: 2015-07-04

## 2015-04-08 NOTE — Assessment & Plan Note (Signed)
Improve diet--increase lisinopril

## 2015-04-08 NOTE — Telephone Encounter (Signed)
Wife called and would like to speak to Dr. Kennon Rounds. There seems to be some confusion on what the next step is in his care. Please call to discuss. jw

## 2015-04-08 NOTE — Patient Instructions (Signed)
DASH Eating Plan  DASH stands for "Dietary Approaches to Stop Hypertension." The DASH eating plan is a healthy eating plan that has been shown to reduce high blood pressure (hypertension). Additional health benefits may include reducing the risk of type 2 diabetes mellitus, heart disease, and stroke. The DASH eating plan may also help with weight loss.  WHAT DO I NEED TO KNOW ABOUT THE DASH EATING PLAN?  For the DASH eating plan, you will follow these general guidelines:  · Choose foods with a percent daily value for sodium of less than 5% (as listed on the food label).  · Use salt-free seasonings or herbs instead of table salt or sea salt.  · Check with your health care provider or pharmacist before using salt substitutes.  · Eat lower-sodium products, often labeled as "lower sodium" or "no salt added."  · Eat fresh foods.  · Eat more vegetables, fruits, and low-fat dairy products.  · Choose whole grains. Look for the word "whole" as the first word in the ingredient list.  · Choose fish and skinless chicken or turkey more often than red meat. Limit fish, poultry, and meat to 6 oz (170 g) each day.  · Limit sweets, desserts, sugars, and sugary drinks.  · Choose heart-healthy fats.  · Limit cheese to 1 oz (28 g) per day.  · Eat more home-cooked food and less restaurant, buffet, and fast food.  · Limit fried foods.  · Cook foods using methods other than frying.  · Limit canned vegetables. If you do use them, rinse them well to decrease the sodium.  · When eating at a restaurant, ask that your food be prepared with less salt, or no salt if possible.  WHAT FOODS CAN I EAT?  Seek help from a dietitian for individual calorie needs.  Grains  Whole grain or whole wheat bread. Brown rice. Whole grain or whole wheat pasta. Quinoa, bulgur, and whole grain cereals. Low-sodium cereals. Corn or whole wheat flour tortillas. Whole grain cornbread. Whole grain crackers. Low-sodium crackers.  Vegetables  Fresh or frozen vegetables  (raw, steamed, roasted, or grilled). Low-sodium or reduced-sodium tomato and vegetable juices. Low-sodium or reduced-sodium tomato sauce and paste. Low-sodium or reduced-sodium canned vegetables.   Fruits  All fresh, canned (in natural juice), or frozen fruits.  Meat and Other Protein Products  Ground beef (85% or leaner), grass-fed beef, or beef trimmed of fat. Skinless chicken or turkey. Ground chicken or turkey. Pork trimmed of fat. All fish and seafood. Eggs. Dried beans, peas, or lentils. Unsalted nuts and seeds. Unsalted canned beans.  Dairy  Low-fat dairy products, such as skim or 1% milk, 2% or reduced-fat cheeses, low-fat ricotta or cottage cheese, or plain low-fat yogurt. Low-sodium or reduced-sodium cheeses.  Fats and Oils  Tub margarines without trans fats. Light or reduced-fat mayonnaise and salad dressings (reduced sodium). Avocado. Safflower, olive, or canola oils. Natural peanut or almond butter.  Other  Unsalted popcorn and pretzels.  The items listed above may not be a complete list of recommended foods or beverages. Contact your dietitian for more options.  WHAT FOODS ARE NOT RECOMMENDED?  Grains  White bread. White pasta. White rice. Refined cornbread. Bagels and croissants. Crackers that contain trans fat.  Vegetables  Creamed or fried vegetables. Vegetables in a cheese sauce. Regular canned vegetables. Regular canned tomato sauce and paste. Regular tomato and vegetable juices.  Fruits  Dried fruits. Canned fruit in light or heavy syrup. Fruit juice.  Meat and Other Protein   Products  Fatty cuts of meat. Ribs, chicken wings, bacon, sausage, bologna, salami, chitterlings, fatback, hot dogs, bratwurst, and packaged luncheon meats. Salted nuts and seeds. Canned beans with salt.  Dairy  Whole or 2% milk, cream, half-and-half, and cream cheese. Whole-fat or sweetened yogurt. Full-fat cheeses or blue cheese. Nondairy creamers and whipped toppings. Processed cheese, cheese spreads, or cheese  curds.  Condiments  Onion and garlic salt, seasoned salt, table salt, and sea salt. Canned and packaged gravies. Worcestershire sauce. Tartar sauce. Barbecue sauce. Teriyaki sauce. Soy sauce, including reduced sodium. Steak sauce. Fish sauce. Oyster sauce. Cocktail sauce. Horseradish. Ketchup and mustard. Meat flavorings and tenderizers. Bouillon cubes. Hot sauce. Tabasco sauce. Marinades. Taco seasonings. Relishes.  Fats and Oils  Butter, stick margarine, lard, shortening, ghee, and bacon fat. Coconut, palm kernel, or palm oils. Regular salad dressings.  Other  Pickles and olives. Salted popcorn and pretzels.  The items listed above may not be a complete list of foods and beverages to avoid. Contact your dietitian for more information.  WHERE CAN I FIND MORE INFORMATION?  National Heart, Lung, and Blood Institute: www.nhlbi.nih.gov/health/health-topics/topics/dash/     This information is not intended to replace advice given to you by your health care provider. Make sure you discuss any questions you have with your health care provider.     Document Released: 02/05/2011 Document Revised: 03/09/2014 Document Reviewed: 12/21/2012  Elsevier Interactive Patient Education ©2016 Elsevier Inc.

## 2015-04-08 NOTE — Assessment & Plan Note (Signed)
Getting around with cane.

## 2015-04-08 NOTE — Progress Notes (Signed)
    Subjective:    Patient ID: Shaun Lutz is a 61 y.o. male presenting with Hypertension  on 04/08/2015  HPI: Here for routine f/u. Has had colonoscopy.  Refuses vaccinations. BP is high but he is not following a low sodium diet.  Review of Systems  Constitutional: Negative for fever and chills.  Respiratory: Negative for shortness of breath.   Cardiovascular: Negative for leg swelling.  Gastrointestinal: Negative for nausea, vomiting and abdominal pain.      Objective:    BP 170/100 mmHg  Pulse 80  Temp(Src) 98.6 F (37 C) (Oral)  Wt 127 lb (57.607 kg) Physical Exam  Constitutional: He appears well-developed and well-nourished. No distress.  HENT:  Head: Normocephalic and atraumatic.  Eyes: No scleral icterus.  Neck: Neck supple.  Cardiovascular: Normal rate.   Pulmonary/Chest: Effort normal.  Abdominal: Soft.  Musculoskeletal: He exhibits no edema.  Neurological: He is alert.  Skin: Skin is warm.  Psychiatric: He has a normal mood and affect.  Vitals reviewed.       Assessment & Plan:   Problem List Items Addressed This Visit      Unprioritized   Abnormality of gait    Getting around with cane.      Essential hypertension - Primary    Improve diet--increase lisinopril      Relevant Medications   lisinopril (PRINIVIL,ZESTRIL) 10 MG tablet       Return in about 3 months (around 07/06/2015) for a follow-up.  PRATT,TANYA S 04/08/2015 2:25 PM

## 2015-05-06 ENCOUNTER — Other Ambulatory Visit: Payer: Medicare Other

## 2015-05-06 ENCOUNTER — Other Ambulatory Visit (HOSPITAL_COMMUNITY)
Admission: RE | Admit: 2015-05-06 | Discharge: 2015-05-06 | Disposition: A | Discharge status: Home / Self Care | Payer: Medicare Other | Source: Ambulatory Visit | Attending: Infectious Disease | Admitting: Infectious Disease

## 2015-05-06 DIAGNOSIS — B20 Human immunodeficiency virus [HIV] disease: Secondary | ICD-10-CM

## 2015-05-06 DIAGNOSIS — A523 Neurosyphilis, unspecified: Secondary | ICD-10-CM

## 2015-05-06 DIAGNOSIS — Z113 Encounter for screening for infections with a predominantly sexual mode of transmission: Secondary | ICD-10-CM | POA: Diagnosis not present

## 2015-05-06 DIAGNOSIS — Z79899 Other long term (current) drug therapy: Secondary | ICD-10-CM

## 2015-05-06 DIAGNOSIS — Z21 Asymptomatic human immunodeficiency virus [HIV] infection status: Secondary | ICD-10-CM | POA: Diagnosis not present

## 2015-05-06 LAB — CBC WITH DIFFERENTIAL/PLATELET
BASOS ABS: 0.1 10*3/uL (ref 0.0–0.1)
BASOS PCT: 1 % (ref 0–1)
EOS ABS: 0.4 10*3/uL (ref 0.0–0.7)
EOS PCT: 7 % — AB (ref 0–5)
HEMATOCRIT: 42.8 % (ref 39.0–52.0)
Hemoglobin: 14 g/dL (ref 13.0–17.0)
LYMPHS PCT: 25 % (ref 12–46)
Lymphs Abs: 1.5 10*3/uL (ref 0.7–4.0)
MCH: 31.8 pg (ref 26.0–34.0)
MCHC: 32.7 g/dL (ref 30.0–36.0)
MCV: 97.3 fL (ref 78.0–100.0)
MPV: 9.6 fL (ref 8.6–12.4)
Monocytes Absolute: 0.4 10*3/uL (ref 0.1–1.0)
Monocytes Relative: 7 % (ref 3–12)
Neutro Abs: 3.6 10*3/uL (ref 1.7–7.7)
Neutrophils Relative %: 60 % (ref 43–77)
PLATELETS: 264 10*3/uL (ref 150–400)
RBC: 4.4 MIL/uL (ref 4.22–5.81)
RDW: 12.9 % (ref 11.5–15.5)
WBC: 6 10*3/uL (ref 4.0–10.5)

## 2015-05-06 LAB — COMPLETE METABOLIC PANEL WITH GFR
ALBUMIN: 3.8 g/dL (ref 3.6–5.1)
ALK PHOS: 72 U/L (ref 40–115)
ALT: 6 U/L — AB (ref 9–46)
AST: 10 U/L (ref 10–35)
BUN: 15 mg/dL (ref 7–25)
CALCIUM: 8.5 mg/dL — AB (ref 8.6–10.3)
CHLORIDE: 103 mmol/L (ref 98–110)
CO2: 24 mmol/L (ref 20–31)
CREATININE: 1.07 mg/dL (ref 0.70–1.25)
GFR, EST AFRICAN AMERICAN: 87 mL/min (ref >=60)
GFR, Est Non African American: 75 mL/min (ref >=60)
Glucose, Bld: 142 mg/dL — ABNORMAL HIGH (ref 65–99)
Potassium: 4.5 mmol/L (ref 3.5–5.3)
Sodium: 136 mmol/L (ref 135–146)
Total Bilirubin: 0.6 mg/dL (ref 0.2–1.2)
Total Protein: 7.3 g/dL (ref 6.1–8.1)

## 2015-05-06 NOTE — Addendum Note (Signed)
Addended by: Dolan Amen D on: 05/06/2015 11:57 AM   Modules accepted: Orders

## 2015-05-07 ENCOUNTER — Telehealth: Payer: Self-pay | Admitting: *Deleted

## 2015-05-07 LAB — T-HELPER CELL (CD4) - (RCID CLINIC ONLY)
CD4 % Helper T Cell: 21 % — ABNORMAL LOW (ref 33–55)
CD4 T Cell Abs: 340 /uL — ABNORMAL LOW (ref 400–2700)

## 2015-05-07 LAB — HIV-1 RNA QUANT-NO REFLEX-BLD: HIV-1 RNA Quant, Log: <1.3 Log copies/mL (ref <1.30)

## 2015-05-07 LAB — URINE CYTOLOGY ANCILLARY ONLY
CHLAMYDIA, DNA PROBE: NEGATIVE
NEISSERIA GONORRHEA: NEGATIVE

## 2015-05-07 LAB — RPR TITER: RPR Titer: 1:128 {titer} — AB

## 2015-05-07 LAB — RPR: RPR: REACTIVE — AB

## 2015-05-07 NOTE — Telephone Encounter (Signed)
-----   Message from Truman Hayward, MD sent at 05/07/2015  3:58 PM EST ----- RPR is up yet again. We have not been able to convince this patient to be treated for his neurosyphilis with IV ceftriaxone or penicillin his severe dementia with anterograde amnesia as is a significant impediment. So other health department inevitably cause regarding his high RPR tell them that we will again have discussions with the patient and his wife. I do not think this patient is at risk for transmitting syphilis to others in his current state and we have treated him repeatedly including a protracted course of doxycycline. I cannot force him to be hospitalized for treatment with IV ceftriaxone or penicillin and he has not been agreeable to either hospitalization or to placement of a PICC line

## 2015-05-07 NOTE — Telephone Encounter (Signed)
FYI to triage nurses and for health department. Myrtis Hopping CMA

## 2015-05-08 LAB — FLUORESCENT TREPONEMAL AB(FTA)-IGG-BLD: FLUORESCENT TREPONEMAL ABS: REACTIVE — AB

## 2015-05-20 ENCOUNTER — Ambulatory Visit (INDEPENDENT_AMBULATORY_CARE_PROVIDER_SITE_OTHER): Payer: Medicare Other | Admitting: Infectious Disease

## 2015-05-20 ENCOUNTER — Encounter: Payer: Self-pay | Admitting: Infectious Disease

## 2015-05-20 VITALS — BP 139/98 | HR 75 | Temp 98.1°F | Ht 68.0 in | Wt 132.0 lb

## 2015-05-20 DIAGNOSIS — A523 Neurosyphilis, unspecified: Secondary | ICD-10-CM | POA: Diagnosis not present

## 2015-05-20 DIAGNOSIS — E785 Hyperlipidemia, unspecified: Secondary | ICD-10-CM | POA: Diagnosis not present

## 2015-05-20 DIAGNOSIS — F0391 Unspecified dementia with behavioral disturbance: Secondary | ICD-10-CM | POA: Diagnosis not present

## 2015-05-20 DIAGNOSIS — Z21 Asymptomatic human immunodeficiency virus [HIV] infection status: Secondary | ICD-10-CM

## 2015-05-20 DIAGNOSIS — B20 Human immunodeficiency virus [HIV] disease: Secondary | ICD-10-CM

## 2015-05-20 DIAGNOSIS — F03918 Unspecified dementia, unspecified severity, with other behavioral disturbance: Secondary | ICD-10-CM

## 2015-05-20 NOTE — Progress Notes (Signed)
Chief complaint: has been irritable and per wife they have been arguing with eachother more recently  Subjective:    Patient ID: Shaun Lutz, male    DOB: [birth year], 60 y.o.   MRN: [id number]  HPI   60 year male with with COPD, Pacemaker (medtronic 2012) frequent falls gait abnormality found to have HIV and Neurosyphilis. He received 13 days of IV therapy.   He had what was thought to be PCN rash and changed to rocephin last 2 days of therapy before pulling out his IV  We were eventually able to get him onto TIVICAY and Truvada through emergency SPAP and now on Tivicay and Descovy  His viral load is nondetectable his CD4 count remains above 300.  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    Past Medical History  Diagnosis Date  . Hypertension   . Second degree AV block   . Syncope   . Pacemaker -Medtronic     DOI 2012  . SYNCOPE 04/07/2010  . HIV (human immunodeficiency virus infection) ([place])   . Neurosyphilis   . 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 Photosensitive contact dermatitis 09/12/2014    Past Surgical History  Procedure Laterality Date  . Pacemaker insertion    . Colonoscopy with propofol N/A 03/06/2015    Procedure: COLONOSCOPY WITH PROPOFOL;  Surgeon: Arta Silence, MD;  Location: [facility];  Service: Endoscopy;  Laterality: N/A;    Family History  Problem Relation Age of Onset  . Cancer Mother   . Heart disease Father   . Heart disease Sister   . Heart disease Brother         Social History   Social History  . Marital Status: Divorced    Spouse Name: N/A  . Number of Children: N/A  . Years of Education: N/A   Social History Main Topics  . Smoking status: Current Every Day Smoker -- 0.25 packs/day for 35 years    Types: Cigarettes  . Smokeless tobacco: Never Used  . Alcohol Use: No  . Drug Use: No  . Sexual Activity: Not Asked   Other Topics Concern  . None   Social History Narrative    Allergies  Allergen Reactions  . Penicillins Hives and Rash    Has patient had a PCN reaction causing immediate rash, facial/tongue/throat swelling, SOB or lightheadedness with hypotension: No Has patient had a PCN reaction causing severe rash involving mucus membranes or skin necrosis: No Has patient had a PCN reaction that required hospitalization: No Has patient had a PCN reaction occurring within the last 10 years: No If all of the above answers are "NO", then may proceed with Cephalosporin use.      Current outpatient prescriptions:  .  aspirin EC 81 MG tablet, Take 81 mg by mouth daily., Disp: , Rfl:  .  atenolol (TENORMIN) 50 MG tablet, Take 1 tablet (50 mg total) by mouth daily., Disp: 30 tablet, Rfl: 11 .  dolutegravir (TIVICAY) 50 MG tablet, Take 1 tablet (50 mg total) by mouth daily., Disp: 30 tablet, Rfl: 11 .  emtricitabine-tenofovir AF (DESCOVY) 200-25 MG per tablet, Take 1 tablet by mouth daily., Disp: 30 tablet, Rfl: 11 .  lisinopril (PRINIVIL,ZESTRIL) 10 MG tablet, Take 1 tablet (10 mg total) by mouth daily., Disp: 30 tablet, Rfl: 3   Lab Results  Component Value Date   HIV1RNAQUANT <20 05/06/2015   Lab Results  Component Value Date   CD4TABS 340* 05/06/2015   CD4TABS 400 01/07/2015   CD4TABS 280* 08/20/2014   His RPR failed to drop over the first 6 months remaining at 1 512. We had a lumbar puncture performed earlier in October  and CSF profile had an elevated protein but normal white blood cell count one white blood cell count on  spinal fluid. CSF VDRL and CSF FTA antibodies WERE BOTH POSITIVE  We brought him in with the intention of treating him for Neurosyphilis via PCN desensitization in the ICU but upon learning that this was the purpose of the visit he became severely agitated and absolutely refused to cooperate with admission.  We never were able to do this or rx as outpatient with IV rocephin so I eventually wrote for doxycycline 100mg  bid x 28 days in likely futile attempt to treat his Neurosyphilis again. Since then he has developed a photosensitivity rash from doxy which now is getting better with low potency corticosteroids and time (he is still taking the doxy)    Review of Systems  Unable to perform ROS Skin: Positive for rash.  Psychiatric/Behavioral: Negative for sleep disturbance.       Objective:   Physical Exam  Constitutional: He appears well-developed and well-nourished.  HENT:  Head: Normocephalic and atraumatic.  Eyes: EOM are normal.  Neck: Normal range of motion. Neck supple.  Cardiovascular: Normal rate and regular rhythm.   Pulmonary/Chest: Effort normal. No respiratory distress.  Abdominal: Soft. He exhibits no distension.  Neurological: He is alert.  Psychiatric: His speech is delayed. He is slowed and withdrawn. Cognition and memory are impaired. He expresses inappropriate judgment. He exhibits abnormal recent memory and abnormal remote memory.  Nursing note and vitals reviewed.    He has vitiligo like skin changes several appts ago            Assessment & Plan:    HIV:  doing extremely well , continue current regimen of Tivicay and Descovy  Neurosyphilis: HAVE BEEN UNABLE TO GET HIM ON EFFECTIVE PARETERAL THERAPY TO TREAT THIS EFFECITVELY FOR 2ND TIME SO WE WENT WITH DOXY ISTEAD and titers came down but back up again. Will defer treatment since he will not cooperate with IV abx at home or in SNF and I have little that treatment will offer any ability for [place] to reverse  his permanent Neurological deficits,   Dementia, combination of Neurosyphilis +/- HIV dementia: not going to recover his antegrade memory it seems. Will need extensive support from his friend  and caregiver  HTN: being followed by Dr. Kennon Rounds  I spent greater than 25 minutes with the patient including greater than 50% of time in face to face counsel of the patient and wife, re his HIV, Neurosyphilis, antegrade amnesia, dementia with behavioral disturbance, HTN and in coordination of his care.

## 2015-05-22 ENCOUNTER — Telehealth: Payer: Self-pay

## 2015-05-22 NOTE — Telephone Encounter (Signed)
-----   Message from Georgena Spurling, [place] sent at 05/07/2015  4:29 PM EST ----- Regarding: FYI   ----- Message -----    From: Truman Hayward, MD    Sent: 05/07/2015   3:58 PM      To: [facility] Triage Nurse Pool  RPR is up yet again. We have not been able to convince this patient to be treated for his neurosyphilis with IV ceftriaxone or penicillin his severe dementia with anterograde amnesia as is a significant impediment. So other health department inevitably cause regarding his high RPR tell them that we will again have discussions with the patient and his wife. I do not think this patient is at risk for transmitting syphilis to others in his current state and we have treated him repeatedly including a protracted course of doxycycline. I cannot force him to be hospitalized for treatment with IV ceftriaxone or penicillin and he has not been agreeable to either hospitalization or to placement of a PICC line

## 2015-07-04 ENCOUNTER — Ambulatory Visit (INDEPENDENT_AMBULATORY_CARE_PROVIDER_SITE_OTHER): Payer: Medicare Other | Admitting: Family Medicine

## 2015-07-04 ENCOUNTER — Encounter: Payer: Self-pay | Admitting: Family Medicine

## 2015-07-04 VITALS — BP 159/107 | HR 79 | Temp 98.1°F | Wt 132.0 lb

## 2015-07-04 DIAGNOSIS — I10 Essential (primary) hypertension: Secondary | ICD-10-CM | POA: Diagnosis not present

## 2015-07-04 DIAGNOSIS — Z720 Tobacco use: Secondary | ICD-10-CM | POA: Diagnosis not present

## 2015-07-04 DIAGNOSIS — Z21 Asymptomatic human immunodeficiency virus [HIV] infection status: Secondary | ICD-10-CM | POA: Diagnosis not present

## 2015-07-04 DIAGNOSIS — A539 Syphilis, unspecified: Secondary | ICD-10-CM

## 2015-07-04 DIAGNOSIS — B20 Human immunodeficiency virus [HIV] disease: Secondary | ICD-10-CM

## 2015-07-04 DIAGNOSIS — R636 Underweight: Secondary | ICD-10-CM

## 2015-07-04 MED ORDER — LISINOPRIL 20 MG PO TABS: 20.0000 mg | ORAL_TABLET | Freq: Every day | ORAL | Status: DC

## 2015-07-04 NOTE — Assessment & Plan Note (Signed)
Weight is up by 13 pounds since last July

## 2015-07-04 NOTE — Assessment & Plan Note (Signed)
Continues to have high RPR titers

## 2015-07-04 NOTE — Progress Notes (Signed)
    Subjective:    Patient ID: Shaun Lutz is a 60 y.o. male presenting with Hypertension  on 07/04/2015  HPI: Here today for follow up of his HTN. Seen by ID and RPR is up again. Has not been fully treated. Viral load of HIV is undetectable. CD4 count is > 350. Increased med last visit, but BP remains up. Has had colonoscopy since last visit.  Review of Systems  Constitutional: Negative for fever and chills.  Respiratory: Negative for shortness of breath.   Cardiovascular: Negative for leg swelling.  Gastrointestinal: Negative for nausea, vomiting and abdominal pain.      Objective:    BP 159/107 mmHg  Pulse 79  Temp(Src) 98.1 F (36.7 C) (Oral)  Wt 132 lb (59.875 kg) Physical Exam  Constitutional: He appears well-developed and well-nourished. No distress.  HENT:  Head: Normocephalic and atraumatic.  Eyes: No scleral icterus.  Neck: Neck supple.  Cardiovascular: Normal rate and regular rhythm.   No murmur heard. Pulmonary/Chest: Effort normal and breath sounds normal.  Abdominal: Soft.  Musculoskeletal: He exhibits no edema.  Neurological: He is alert.  Skin: Skin is warm.  Vitals reviewed.       Assessment & Plan:   Problem List Items Addressed This Visit      Medium   Underweight    Weight is up by 13 pounds since last July        Unprioritized   HIV (human immunodeficiency virus infection) ([place]) (Chronic)    Per ID      Syphilis in male    Continues to have high RPR titers      Tobacco abuse    Normal LDCT scan from 10/16      Essential hypertension - Primary    Increase Lisinopril to 20 mg daily      Relevant Medications   lisinopril (PRINIVIL,ZESTRIL) 20 MG tablet      Total face-to-face time with patient: 15 minutes. Over 50% of encounter was spent on counseling and coordination of care. Return in about 3 months (around 10/04/2015).  Ulonda Klosowski S 07/04/2015 1:58 PM

## 2015-07-04 NOTE — Assessment & Plan Note (Signed)
Increase Lisinopril to 20 mg daily

## 2015-07-04 NOTE — Assessment & Plan Note (Signed)
Per ID 

## 2015-07-04 NOTE — Patient Instructions (Signed)
DASH Eating Plan  DASH stands for "Dietary Approaches to Stop Hypertension." The DASH eating plan is a healthy eating plan that has been shown to reduce high blood pressure (hypertension). Additional health benefits may include reducing the risk of type 2 diabetes mellitus, heart disease, and stroke. The DASH eating plan may also help with weight loss.  WHAT DO I NEED TO KNOW ABOUT THE DASH EATING PLAN?  For the DASH eating plan, you will follow these general guidelines:  · Choose foods with a percent daily value for sodium of less than 5% (as listed on the food label).  · Use salt-free seasonings or herbs instead of table salt or sea salt.  · Check with your health care provider or pharmacist before using salt substitutes.  · Eat lower-sodium products, often labeled as "lower sodium" or "no salt added."  · Eat fresh foods.  · Eat more vegetables, fruits, and low-fat dairy products.  · Choose whole grains. Look for the word "whole" as the first word in the ingredient list.  · Choose fish and skinless chicken or turkey more often than red meat. Limit fish, poultry, and meat to 6 oz (170 g) each day.  · Limit sweets, desserts, sugars, and sugary drinks.  · Choose heart-healthy fats.  · Limit cheese to 1 oz (28 g) per day.  · Eat more home-cooked food and less restaurant, buffet, and fast food.  · Limit fried foods.  · Cook foods using methods other than frying.  · Limit canned vegetables. If you do use them, rinse them well to decrease the sodium.  · When eating at a restaurant, ask that your food be prepared with less salt, or no salt if possible.  WHAT FOODS CAN I EAT?  Seek help from a dietitian for individual calorie needs.  Grains  Whole grain or whole wheat bread. Brown rice. Whole grain or whole wheat pasta. Quinoa, bulgur, and whole grain cereals. Low-sodium cereals. Corn or whole wheat flour tortillas. Whole grain cornbread. Whole grain crackers. Low-sodium crackers.  Vegetables  Fresh or frozen vegetables  (raw, steamed, roasted, or grilled). Low-sodium or reduced-sodium tomato and vegetable juices. Low-sodium or reduced-sodium tomato sauce and paste. Low-sodium or reduced-sodium canned vegetables.   Fruits  All fresh, canned (in natural juice), or frozen fruits.  Meat and Other Protein Products  Ground beef (85% or leaner), grass-fed beef, or beef trimmed of fat. Skinless chicken or turkey. Ground chicken or turkey. Pork trimmed of fat. All fish and seafood. Eggs. Dried beans, peas, or lentils. Unsalted nuts and seeds. Unsalted canned beans.  Dairy  Low-fat dairy products, such as skim or 1% milk, 2% or reduced-fat cheeses, low-fat ricotta or cottage cheese, or plain low-fat yogurt. Low-sodium or reduced-sodium cheeses.  Fats and Oils  Tub margarines without trans fats. Light or reduced-fat mayonnaise and salad dressings (reduced sodium). Avocado. Safflower, olive, or canola oils. Natural peanut or almond butter.  Other  Unsalted popcorn and pretzels.  The items listed above may not be a complete list of recommended foods or beverages. Contact your dietitian for more options.  WHAT FOODS ARE NOT RECOMMENDED?  Grains  White bread. White pasta. White rice. Refined cornbread. Bagels and croissants. Crackers that contain trans fat.  Vegetables  Creamed or fried vegetables. Vegetables in a cheese sauce. Regular canned vegetables. Regular canned tomato sauce and paste. Regular tomato and vegetable juices.  Fruits  Dried fruits. Canned fruit in light or heavy syrup. Fruit juice.  Meat and Other Protein   Products  Fatty cuts of meat. Ribs, chicken wings, bacon, sausage, bologna, salami, chitterlings, fatback, hot dogs, bratwurst, and packaged luncheon meats. Salted nuts and seeds. Canned beans with salt.  Dairy  Whole or 2% milk, cream, half-and-half, and cream cheese. Whole-fat or sweetened yogurt. Full-fat cheeses or blue cheese. Nondairy creamers and whipped toppings. Processed cheese, cheese spreads, or cheese  curds.  Condiments  Onion and garlic salt, seasoned salt, table salt, and sea salt. Canned and packaged gravies. Worcestershire sauce. Tartar sauce. Barbecue sauce. Teriyaki sauce. Soy sauce, including reduced sodium. Steak sauce. Fish sauce. Oyster sauce. Cocktail sauce. Horseradish. Ketchup and mustard. Meat flavorings and tenderizers. Bouillon cubes. Hot sauce. Tabasco sauce. Marinades. Taco seasonings. Relishes.  Fats and Oils  Butter, stick margarine, lard, shortening, ghee, and bacon fat. Coconut, palm kernel, or palm oils. Regular salad dressings.  Other  Pickles and olives. Salted popcorn and pretzels.  The items listed above may not be a complete list of foods and beverages to avoid. Contact your dietitian for more information.  WHERE CAN I FIND MORE INFORMATION?  National Heart, Lung, and Blood Institute: www.nhlbi.nih.gov/health/health-topics/topics/dash/     This information is not intended to replace advice given to you by your health care provider. Make sure you discuss any questions you have with your health care provider.     Document Released: 02/05/2011 Document Revised: 03/09/2014 Document Reviewed: 12/21/2012  Elsevier Interactive Patient Education ©2016 Elsevier Inc.

## 2015-07-04 NOTE — Assessment & Plan Note (Signed)
Normal LDCT scan from 10/16

## 2015-07-10 ENCOUNTER — Encounter: Payer: Self-pay | Admitting: Internal Medicine

## 2015-09-09 ENCOUNTER — Other Ambulatory Visit: Payer: Medicare Other

## 2015-09-09 ENCOUNTER — Other Ambulatory Visit: Payer: Self-pay | Admitting: Infectious Disease

## 2015-09-09 ENCOUNTER — Other Ambulatory Visit (HOSPITAL_COMMUNITY)
Admission: RE | Admit: 2015-09-09 | Discharge: 2015-09-09 | Disposition: A | Discharge status: Home / Self Care | Payer: Medicare Other | Source: Ambulatory Visit | Attending: Infectious Disease | Admitting: Infectious Disease

## 2015-09-09 DIAGNOSIS — E785 Hyperlipidemia, unspecified: Secondary | ICD-10-CM | POA: Diagnosis not present

## 2015-09-09 DIAGNOSIS — B20 Human immunodeficiency virus [HIV] disease: Secondary | ICD-10-CM

## 2015-09-09 DIAGNOSIS — Z21 Asymptomatic human immunodeficiency virus [HIV] infection status: Secondary | ICD-10-CM | POA: Diagnosis not present

## 2015-09-09 DIAGNOSIS — D126 Benign neoplasm of colon, unspecified: Secondary | ICD-10-CM | POA: Insufficient documentation

## 2015-09-09 LAB — COMPLETE METABOLIC PANEL WITH GFR
ALBUMIN: 4.2 g/dL (ref 3.6–5.1)
ALK PHOS: 71 U/L (ref 40–115)
ALT: 7 U/L — ABNORMAL LOW (ref 9–46)
AST: 12 U/L (ref 10–35)
BUN: 15 mg/dL (ref 7–25)
CALCIUM: 8.6 mg/dL (ref 8.6–10.3)
CO2: 29 mmol/L (ref 20–31)
CREATININE: 1.23 mg/dL (ref 0.70–1.25)
Chloride: 104 mmol/L (ref 98–110)
GFR, EST AFRICAN AMERICAN: 73 mL/min (ref >=60)
GFR, Est Non African American: 63 mL/min (ref >=60)
Glucose, Bld: 104 mg/dL — ABNORMAL HIGH (ref 65–99)
POTASSIUM: 4.6 mmol/L (ref 3.5–5.3)
Sodium: 140 mmol/L (ref 135–146)
Total Bilirubin: 0.6 mg/dL (ref 0.2–1.2)
Total Protein: 7.4 g/dL (ref 6.1–8.1)

## 2015-09-09 LAB — CBC WITH DIFFERENTIAL/PLATELET
BASOS ABS: 58 {cells}/uL (ref 0–200)
Basophils Relative: 1 %
EOS ABS: 406 {cells}/uL (ref 15–500)
Eosinophils Relative: 7 %
HEMATOCRIT: 42.2 % (ref 38.5–50.0)
HEMOGLOBIN: 14.1 g/dL (ref 13.2–17.1)
LYMPHS ABS: 1566 {cells}/uL (ref 850–3900)
LYMPHS PCT: 27 %
MCH: 32.3 pg (ref 27.0–33.0)
MCHC: 33.4 g/dL (ref 32.0–36.0)
MCV: 96.6 fL (ref 80.0–100.0)
MONO ABS: 522 {cells}/uL (ref 200–950)
MPV: 9.8 fL (ref 7.5–12.5)
Monocytes Relative: 9 %
NEUTROS PCT: 56 %
Neutro Abs: 3248 cells/uL (ref 1500–7800)
Platelets: 255 10*3/uL (ref 140–400)
RBC: 4.37 MIL/uL (ref 4.20–5.80)
RDW: 13.1 % (ref 11.0–15.0)
WBC: 5.8 10*3/uL (ref 3.8–10.8)

## 2015-09-09 LAB — LIPID PANEL
Cholesterol: 141 mg/dL (ref 125–200)
HDL: 52 mg/dL (ref >=40)
LDL Cholesterol: 76 mg/dL (ref <130)
Total CHOL/HDL Ratio: 2.7 Ratio (ref <=5.0)
Triglycerides: 65 mg/dL (ref <150)
VLDL: 13 mg/dL (ref <30)

## 2015-09-09 NOTE — Addendum Note (Signed)
Addended byMeriel Pica F on: 09/09/2015 09:07 AM   Modules accepted: Orders

## 2015-09-10 LAB — HIV-1 RNA QUANT-NO REFLEX-BLD
HIV 1 RNA Quant: <20 copies/mL (ref <20)
HIV-1 RNA Quant, Log: <1.3 Log copies/mL (ref <1.30)

## 2015-09-10 LAB — MICROALBUMIN / CREATININE URINE RATIO
CREATININE, URINE: 257 mg/dL (ref 20–370)
MICROALB/CREAT RATIO: 8 ug/mg{creat} (ref <30)
Microalb, Ur: 2 mg/dL

## 2015-09-10 LAB — T-HELPER CELL (CD4) - (RCID CLINIC ONLY)
CD4 % Helper T Cell: 21 % — ABNORMAL LOW (ref 33–55)
CD4 T Cell Abs: 330 /uL — ABNORMAL LOW (ref 400–2700)

## 2015-09-10 LAB — URINE CYTOLOGY ANCILLARY ONLY
CHLAMYDIA, DNA PROBE: NEGATIVE
Neisseria Gonorrhea: NEGATIVE

## 2015-09-11 LAB — RPR: RPR: REACTIVE — AB

## 2015-09-11 LAB — RPR TITER

## 2015-09-11 LAB — FLUORESCENT TREPONEMAL AB(FTA)-IGG-BLD: Fluorescent Treponemal ABS: REACTIVE — AB

## 2015-09-23 ENCOUNTER — Ambulatory Visit (INDEPENDENT_AMBULATORY_CARE_PROVIDER_SITE_OTHER): Payer: Medicare Other | Admitting: Infectious Disease

## 2015-09-23 ENCOUNTER — Ambulatory Visit: Payer: Medicare Other | Admitting: *Deleted

## 2015-09-23 ENCOUNTER — Encounter: Payer: Self-pay | Admitting: Infectious Disease

## 2015-09-23 VITALS — BP 166/114 | HR 85 | Temp 97.8°F | Ht 68.0 in | Wt 137.5 lb

## 2015-09-23 DIAGNOSIS — E785 Hyperlipidemia, unspecified: Secondary | ICD-10-CM | POA: Diagnosis not present

## 2015-09-23 DIAGNOSIS — B20 Human immunodeficiency virus [HIV] disease: Secondary | ICD-10-CM

## 2015-09-23 DIAGNOSIS — Z23 Encounter for immunization: Secondary | ICD-10-CM | POA: Diagnosis not present

## 2015-09-23 DIAGNOSIS — Z21 Asymptomatic human immunodeficiency virus [HIV] infection status: Secondary | ICD-10-CM | POA: Diagnosis not present

## 2015-09-23 DIAGNOSIS — A523 Neurosyphilis, unspecified: Secondary | ICD-10-CM

## 2015-09-23 DIAGNOSIS — F4323 Adjustment disorder with mixed anxiety and depressed mood: Secondary | ICD-10-CM

## 2015-09-23 NOTE — BH Specialist Note (Signed)
Counselor met with Shaun Lutz in the exam room today as a warm hand off.  Patient was accompanied by his wife.  Patient was oriented times four with good affect and dress.  Patient was alert and somewhat talkative.  Patient was cooperative and inquired about the support group.  Counselor shared with patient about the support group and was encouraged to attend.  Patient said he would like very much to attend.  Counselor provided support and information about counseling as well as the support group.    Rolena Infante, MA, [facility]

## 2015-09-23 NOTE — Progress Notes (Signed)
Chief complaint: here for followup for HIV and syphilis  Subjective:    Patient ID: Shaun Lutz, male    DOB: [birth year], 60 y.o.   MRN: [id number]  HPI   60 year male with with COPD, Pacemaker (medtronic 2012) frequent falls gait abnormality found to have HIV and Neurosyphilis. He received 13 days of IV therapy.   He had what was thought to be PCN rash and changed to rocephin last 2 days of therapy before pulling out his IV  We were eventually able to get him onto TIVICAY and Truvada through emergency SPAP and now on Tivicay and Descovy  His viral load is nondetectable his CD4 count remains above 300.  Lab Results  Component Value Date   HIV1RNAQUANT <20 09/09/2015   HIV1RNAQUANT <20 05/06/2015   HIV1RNAQUANT <20 01/07/2015   Lab Results  Component Value Date   CD4TABS 330 (L) 09/09/2015   CD4TABS 340 (L) 05/06/2015   CD4TABS 400 01/07/2015     He never would agree to retreatment with IV PCN or ceftriaxone for Neurosyphilis but we did give him 2 months of doxycyline and RPR has came  Down but never to NR or very low titer.   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 He is now taking notes to keep up with many issues  but he could not tell me  Why he was here to see me.  He did meet with Leveda Anna our counselor and his wife after I left and he may continue with some counselling and engage in Peer Support so I hope that if he does he can understand and accept his diagnosis without it triggering suicidal ideation--I did not myself bring up that diagnosis today for that reason.   Past Medical History:  Diagnosis Date  . HIV (human immunodeficiency virus infection) ([place])   . 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 Hypertension   . Neurosyphilis   . Pacemaker -Medtronic    DOI 2012  . Photosensitive contact dermatitis 09/12/2014  . Second degree AV block   . Syncope   . SYNCOPE 04/07/2010    Past Surgical History:  Procedure Laterality Date    . COLONOSCOPY WITH PROPOFOL N/A 03/06/2015   Procedure: COLONOSCOPY WITH PROPOFOL;  Surgeon: Arta Silence, MD;  Location: [facility];  Service: Endoscopy;  Laterality: N/A;  . PACEMAKER INSERTION      Family History  Problem Relation Age of Onset  . Cancer Mother   . Heart disease Father   . Heart disease Sister   . Heart disease Brother       Social History   Social History  . Marital status: Divorced    Spouse name: N/A  . Number of children: N/A  . Years of education: N/A   Social History Main Topics  . Smoking status: Current Every Day Smoker    Packs/day: 0.25    Years: 35.00    Types: Cigarettes  . Smokeless tobacco: Never Used  . Alcohol use No  . Drug use: No  . Sexual activity: Not Asked   Other Topics Concern  . None   Social History Narrative  . None    Allergies  Allergen Reactions  . Penicillins Hives and Rash    Has patient had a PCN reaction causing immediate rash, facial/tongue/throat swelling, SOB or lightheadedness with hypotension: No Has patient had a PCN reaction causing severe rash involving mucus membranes or skin necrosis: No Has patient had a PCN  reaction that required hospitalization: No Has patient had a PCN reaction occurring within the last 10 years: No If all of the above answers are "NO", then may proceed with Cephalosporin use.      Current Outpatient Prescriptions:  .  aspirin EC 81 MG tablet, Take 81 mg by mouth daily., Disp: , Rfl:  .  atenolol (TENORMIN) 50 MG tablet, Take 1 tablet (50 mg total) by mouth daily., Disp: 30 tablet, Rfl: 11 .  DESCOVY 200-25 MG tablet, TAKE 1 TABLET BY MOUTH DAILY, Disp: 30 tablet, Rfl: 11 .  lisinopril (PRINIVIL,ZESTRIL) 20 MG tablet, Take 1 tablet (20 mg total) by mouth daily., Disp: 30 tablet, Rfl: 3 .  TIVICAY 50 MG tablet, TAKE 1 TABLET(50 MG) BY MOUTH DAILY, Disp: 30 tablet, Rfl: 11   Lab Results  Component Value Date   HIV1RNAQUANT <20 09/09/2015   Lab Results  Component Value  Date   CD4TABS 330 (L) 09/09/2015   CD4TABS 340 (L) 05/06/2015   CD4TABS 400 01/07/2015   His RPR failed to drop over the first 6 months remaining at 1 512. We had a lumbar puncture performed earlier in October  and CSF profile had an elevated protein but normal white blood cell count one white blood cell count on spinal fluid. CSF VDRL and CSF FTA antibodies WERE BOTH POSITIVE  We brought him in with the intention of treating him for Neurosyphilis via PCN desensitization in the ICU but upon learning that this was the purpose of the visit he became severely agitated and absolutely refused to cooperate with admission.  We never were able to do this or rx as outpatient with IV rocephin so I eventually wrote for doxycycline 168m bid x 28 days in likely futile attempt to treat his Neurosyphilis again. Since then he has developed a photosensitivity rash from doxy which now is getting better with low potency corticosteroids and time (he is still taking the doxy)    Review of Systems  Unable to perform ROS Skin: Positive for rash.  Psychiatric/Behavioral: Negative for sleep disturbance.       Objective:   Physical Exam  Constitutional: He appears well-developed and well-nourished.  HENT:  Head: Normocephalic and atraumatic.  Eyes: EOM are normal.  Neck: Normal range of motion. Neck supple.  Cardiovascular: Normal rate and regular rhythm.   Pulmonary/Chest: Effort normal. No respiratory distress.  Abdominal: Soft. He exhibits no distension.  Neurological: He is alert.  Psychiatric: His speech is delayed. He is slowed. Cognition and memory are impaired. He exhibits abnormal recent memory and abnormal remote memory.  Nursing note and vitals reviewed.    He has vitiligo like skin changes           Assessment & Plan:    HIV:  doing extremely well , continue current regimen of Tivicay and Descovy  Neurosyphilis: HAVE BEEN UNABLE TO GET HIM ON EFFECTIVE PARETERAL THERAPY TO TREAT  THIS EFFECITVELY FOR 2ND TIME SO WE WENT WITH DOXY ISTEAD and titers came down but back up again. Will defer treatment since he will not cooperate with IV abx at home or in SNF and I have little that treatment will offer any ability for [place] reverse his permanent Neurological deficits. RPR Is down slightly again  Dementia, combination of Neurosyphilis +/- HIV dementia: not going to recover his antegrade memory it seems. Will need extensive support from his friend  and caregiver  HTN: being followed by Dr. PKennon Rounds  BP is coming up now esp  with weight gain. His wife will schedule apt with Dr. Kennon Rounds  Depression: met with [facility]

## 2015-09-25 ENCOUNTER — Encounter: Payer: Self-pay | Admitting: Family Medicine

## 2015-09-25 ENCOUNTER — Ambulatory Visit (INDEPENDENT_AMBULATORY_CARE_PROVIDER_SITE_OTHER): Payer: Medicare Other | Admitting: Family Medicine

## 2015-09-25 DIAGNOSIS — I10 Essential (primary) hypertension: Secondary | ICD-10-CM

## 2015-09-25 MED ORDER — BENAZEPRIL HCL 40 MG PO TABS: 40.0000 mg | ORAL_TABLET | Freq: Every day | ORAL | 3 refills | Status: DC

## 2015-09-25 MED ORDER — ATENOLOL 100 MG PO TABS: 100.0000 mg | ORAL_TABLET | Freq: Every day | ORAL | 3 refills | Status: DC

## 2015-09-25 NOTE — Assessment & Plan Note (Signed)
Increase his ACE and his Bblocker

## 2015-09-25 NOTE — Progress Notes (Signed)
   Subjective:    Patient ID: Shaun Lutz is a 60 y.o. male presenting with Follow-up (blood pressure)  on 09/25/2015  HPI: Saw Dr. Wendie Agreste and BP elevated and has been most of last 6 months during visits. Last increase in Lisinopril has not decreased his BP.   Review of Systems  Constitutional: Negative for chills and fever.  Respiratory: Negative for shortness of breath.   Cardiovascular: Negative for leg swelling.  Gastrointestinal: Negative for abdominal pain, nausea and vomiting.      Objective:    BP (!) 174/108   Pulse 72   Temp 97.8 F (36.6 C) (Oral)   Wt 137 lb (62.1 kg)   BMI 20.83 kg/m  Physical Exam  Constitutional: He appears well-developed and well-nourished. No distress.  HENT:  Head: Normocephalic and atraumatic.  Eyes: No scleral icterus.  Neck: Neck supple.  Cardiovascular: Normal rate.   Pulmonary/Chest: Effort normal.  Abdominal: Soft.  Musculoskeletal: He exhibits no edema.  Neurological: He is alert.  Skin: Skin is warm.  Psychiatric: He has a normal mood and affect.  Vitals reviewed.       Assessment & Plan:   Problem List Items Addressed This Visit      Unprioritized   Essential hypertension    Increase his ACE and his Bblocker      Relevant Medications   benazepril (LOTENSIN) 40 MG tablet   atenolol (TENORMIN) 100 MG tablet    Other Visit Diagnoses   None.     Total face-to-face time with patient: 15 minutes. Over 50% of encounter was spent on counseling and coordination of care. Return in about 3 months (around 12/26/2015) for a follow-up.  Shaun Lutz S 09/25/2015 3:40 PM

## 2015-09-25 NOTE — Patient Instructions (Signed)
DASH Eating Plan  DASH stands for "Dietary Approaches to Stop Hypertension." The DASH eating plan is a healthy eating plan that has been shown to reduce high blood pressure (hypertension). Additional health benefits may include reducing the risk of type 2 diabetes mellitus, heart disease, and stroke. The DASH eating plan may also help with weight loss.  WHAT DO I NEED TO KNOW ABOUT THE DASH EATING PLAN?  For the DASH eating plan, you will follow these general guidelines:  · Choose foods with a percent daily value for sodium of less than 5% (as listed on the food label).  · Use salt-free seasonings or herbs instead of table salt or sea salt.  · Check with your health care provider or pharmacist before using salt substitutes.  · Eat lower-sodium products, often labeled as "lower sodium" or "no salt added."  · Eat fresh foods.  · Eat more vegetables, fruits, and low-fat dairy products.  · Choose whole grains. Look for the word "whole" as the first word in the ingredient list.  · Choose fish and skinless chicken or turkey more often than red meat. Limit fish, poultry, and meat to 6 oz (170 g) each day.  · Limit sweets, desserts, sugars, and sugary drinks.  · Choose heart-healthy fats.  · Limit cheese to 1 oz (28 g) per day.  · Eat more home-cooked food and less restaurant, buffet, and fast food.  · Limit fried foods.  · Cook foods using methods other than frying.  · Limit canned vegetables. If you do use them, rinse them well to decrease the sodium.  · When eating at a restaurant, ask that your food be prepared with less salt, or no salt if possible.  WHAT FOODS CAN I EAT?  Seek help from a dietitian for individual calorie needs.  Grains  Whole grain or whole wheat bread. Brown rice. Whole grain or whole wheat pasta. Quinoa, bulgur, and whole grain cereals. Low-sodium cereals. Corn or whole wheat flour tortillas. Whole grain cornbread. Whole grain crackers. Low-sodium crackers.  Vegetables  Fresh or frozen vegetables  (raw, steamed, roasted, or grilled). Low-sodium or reduced-sodium tomato and vegetable juices. Low-sodium or reduced-sodium tomato sauce and paste. Low-sodium or reduced-sodium canned vegetables.   Fruits  All fresh, canned (in natural juice), or frozen fruits.  Meat and Other Protein Products  Ground beef (85% or leaner), grass-fed beef, or beef trimmed of fat. Skinless chicken or turkey. Ground chicken or turkey. Pork trimmed of fat. All fish and seafood. Eggs. Dried beans, peas, or lentils. Unsalted nuts and seeds. Unsalted canned beans.  Dairy  Low-fat dairy products, such as skim or 1% milk, 2% or reduced-fat cheeses, low-fat ricotta or cottage cheese, or plain low-fat yogurt. Low-sodium or reduced-sodium cheeses.  Fats and Oils  Tub margarines without trans fats. Light or reduced-fat mayonnaise and salad dressings (reduced sodium). Avocado. Safflower, olive, or canola oils. Natural peanut or almond butter.  Other  Unsalted popcorn and pretzels.  The items listed above may not be a complete list of recommended foods or beverages. Contact your dietitian for more options.  WHAT FOODS ARE NOT RECOMMENDED?  Grains  White bread. White pasta. White rice. Refined cornbread. Bagels and croissants. Crackers that contain trans fat.  Vegetables  Creamed or fried vegetables. Vegetables in a cheese sauce. Regular canned vegetables. Regular canned tomato sauce and paste. Regular tomato and vegetable juices.  Fruits  Dried fruits. Canned fruit in light or heavy syrup. Fruit juice.  Meat and Other Protein   Products  Fatty cuts of meat. Ribs, chicken wings, bacon, sausage, bologna, salami, chitterlings, fatback, hot dogs, bratwurst, and packaged luncheon meats. Salted nuts and seeds. Canned beans with salt.  Dairy  Whole or 2% milk, cream, half-and-half, and cream cheese. Whole-fat or sweetened yogurt. Full-fat cheeses or blue cheese. Nondairy creamers and whipped toppings. Processed cheese, cheese spreads, or cheese  curds.  Condiments  Onion and garlic salt, seasoned salt, table salt, and sea salt. Canned and packaged gravies. Worcestershire sauce. Tartar sauce. Barbecue sauce. Teriyaki sauce. Soy sauce, including reduced sodium. Steak sauce. Fish sauce. Oyster sauce. Cocktail sauce. Horseradish. Ketchup and mustard. Meat flavorings and tenderizers. Bouillon cubes. Hot sauce. Tabasco sauce. Marinades. Taco seasonings. Relishes.  Fats and Oils  Butter, stick margarine, lard, shortening, ghee, and bacon fat. Coconut, palm kernel, or palm oils. Regular salad dressings.  Other  Pickles and olives. Salted popcorn and pretzels.  The items listed above may not be a complete list of foods and beverages to avoid. Contact your dietitian for more information.  WHERE CAN I FIND MORE INFORMATION?  National Heart, Lung, and Blood Institute: www.nhlbi.nih.gov/health/health-topics/topics/dash/     This information is not intended to replace advice given to you by your health care provider. Make sure you discuss any questions you have with your health care provider.     Document Released: 02/05/2011 Document Revised: 03/09/2014 Document Reviewed: 12/21/2012  Elsevier Interactive Patient Education ©2016 Elsevier Inc.

## 2015-10-02 ENCOUNTER — Encounter: Payer: Self-pay | Admitting: Infectious Disease

## 2015-10-15 ENCOUNTER — Encounter (INDEPENDENT_AMBULATORY_CARE_PROVIDER_SITE_OTHER): Payer: Self-pay

## 2015-10-15 ENCOUNTER — Ambulatory Visit (INDEPENDENT_AMBULATORY_CARE_PROVIDER_SITE_OTHER): Payer: Medicare Other | Admitting: Internal Medicine

## 2015-10-15 ENCOUNTER — Encounter: Payer: Self-pay | Admitting: Internal Medicine

## 2015-10-15 VITALS — BP 150/92 | HR 69 | Ht 68.0 in | Wt 139.0 lb

## 2015-10-15 DIAGNOSIS — Z950 Presence of cardiac pacemaker: Secondary | ICD-10-CM

## 2015-10-15 DIAGNOSIS — I472 Ventricular tachycardia: Secondary | ICD-10-CM | POA: Diagnosis not present

## 2015-10-15 DIAGNOSIS — I4729 Other ventricular tachycardia: Secondary | ICD-10-CM

## 2015-10-15 DIAGNOSIS — I441 Atrioventricular block, second degree: Secondary | ICD-10-CM | POA: Diagnosis not present

## 2015-10-15 LAB — CUP PACEART INCLINIC DEVICE CHECK
Battery Impedance: 419 Ohm
Battery Remaining Longevity: 95 mo
Brady Statistic AP VP Percent: 1 %
Brady Statistic AS VP Percent: 0 %
Implantable Lead Location: 753860
Implantable Lead Model: 5076
Implantable Lead Model: 5076
Lead Channel Pacing Threshold Amplitude: 0.5 V
Lead Channel Pacing Threshold Amplitude: 0.75 V
Lead Channel Pacing Threshold Pulse Width: 0.4 ms
Lead Channel Sensing Intrinsic Amplitude: 2 mV
Lead Channel Sensing Intrinsic Amplitude: 8 mV
Lead Channel Setting Pacing Amplitude: 2.5 V
Lead Channel Setting Sensing Sensitivity: 4 mV
MDC IDC LEAD IMPLANT DT: 20120126
MDC IDC LEAD IMPLANT DT: 20120126
MDC IDC LEAD LOCATION: 753859
MDC IDC MSMT BATTERY VOLTAGE: 2.79 V
MDC IDC MSMT LEADCHNL RA IMPEDANCE VALUE: 424 Ohm
MDC IDC MSMT LEADCHNL RV IMPEDANCE VALUE: 475 Ohm
MDC IDC MSMT LEADCHNL RV PACING THRESHOLD PULSEWIDTH: 0.4 ms
MDC IDC SESS DTM: 20170815163052
MDC IDC SET LEADCHNL RA PACING AMPLITUDE: 2 V
MDC IDC SET LEADCHNL RV PACING PULSEWIDTH: 0.4 ms
MDC IDC STAT BRADY AP VS PERCENT: 81 %
MDC IDC STAT BRADY AS VS PERCENT: 18 %

## 2015-10-15 MED ORDER — HYDROCHLOROTHIAZIDE 12.5 MG PO CAPS: 12.5000 mg | ORAL_CAPSULE | Freq: Every day | ORAL | 3 refills | Status: AC

## 2015-10-15 NOTE — Progress Notes (Signed)
Patient Care Team: Donnamae Jude, MD as PCP - General (Obstetrics and Gynecology) Truman Hayward, MD as PCP - Infectious Diseases (Infectious Diseases)   HPI  Shaun Lutz is a 60 y.o. male Seen In followup for a pacemaker implanted 1/12 because of syncope and second degree heart block.it was done by Dr. Loletha Grayer.; however, he lost his insurance and was no longer able to be followed at [facility]   He has Hx of HIV disease incl some attributed dementia  Cardiac evaluation at that time had demonstrated normal left ventricular function by echo. 4/15 Echo EF normal  The patient denies chest pain, shortness of breath, nocturnal dyspnea, orthopnea or peripheral edema.  There have been no palpitations, lightheadedness or syncope.   Blood pressures have been quite elevated. His lisinopril previously started was discontinued. Review of laboratories demonstrates normal renal function consistently over the last 5 years.        Past Medical History:  Diagnosis Date  . HIV (human immunodeficiency virus infection) ([place])   . 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 Hypertension   . Neurosyphilis   . Pacemaker -Medtronic    DOI 2012  . Photosensitive contact dermatitis 09/12/2014  . Second degree AV block   . Syncope   . SYNCOPE 04/07/2010    Past Surgical History:  Procedure Laterality Date  . COLONOSCOPY WITH PROPOFOL N/A 03/06/2015   Procedure: COLONOSCOPY WITH PROPOFOL;  Surgeon: Arta Silence, MD;  Location: [facility];  Service: Endoscopy;  Laterality: N/A;  . PACEMAKER INSERTION      Current Outpatient Prescriptions  Medication Sig Dispense Refill  . aspirin EC 81 MG tablet Take 81 mg by mouth daily.    Marland Kitchen atenolol (TENORMIN) 100 MG tablet Take 1 tablet (100 mg total) by mouth daily. 30 tablet 3  . benazepril (LOTENSIN) 40 MG tablet Take 1 tablet (40 mg total) by mouth daily. 30 tablet 3  . DESCOVY 200-25 MG tablet TAKE 1 TABLET BY MOUTH DAILY 30 tablet 11  . TIVICAY 50 MG tablet TAKE 1  TABLET(50 MG) BY MOUTH DAILY 30 tablet 11   No current facility-administered medications for this visit.     Allergies  Allergen Reactions  . Penicillins Hives and Rash    Has patient had a PCN reaction causing immediate rash, facial/tongue/throat swelling, SOB or lightheadedness with hypotension: No Has patient had a PCN reaction causing severe rash involving mucus membranes or skin necrosis: No Has patient had a PCN reaction that required hospitalization: No Has patient had a PCN reaction occurring within the last 10 years: No If all of the above answers are "NO", then may proceed with Cephalosporin use.     Review of Systems negative except from HPI and PMH  Physical Exam BP (!) 150/92   Pulse 69   Ht 5\' 8"  (1.727 m)   Wt 139 lb (63 kg)   BMI 21.13 kg/m  Well developed and well nourished in no acute distress HENT normal E scleral and icterus clear Neck Supple Clear to ausculation Device pocket well healed; without hematoma or erythema.  There is no tethering   Regular rate and rhythm, no murmurs gallops or rub Soft with active bowel sounds No clubbing cyanosis  Edema Alert and oriented, walks with a walker because of balance Skin Warm and Dry  ECG ordered today demonstrates atrial pacing at 69 28/08/39    Aessment and plan  Second degree AV block-intermittent   First-degree AV block-progressive  Syncope  Hypertension  Atrial fibrillation -paroxysmal  Ventricular tachycardia  No intercurrent Ventricular tachycardia  Pacemaker-Medtronic  The patient's device was interrogated.  The information was reviewed. No changes were made in the programming.    Atrial fibrillation is infrequent. Longest episode is less than 2 minutes. He is not on anticoagulation.  Without symptoms. No recurrent syncope.  His blood pressure is better than it was a week or 2 ago in the family practice clinic. However it still remains quite elevated. His diet is deplete of sodium. We  will add a low-dose diuretic.

## 2015-10-15 NOTE — Patient Instructions (Signed)
Medication Instructions: - Your physician has recommended you make the following change in your medication:  1) Start Hydrochlorothiazide (HCTZ) 12.5 mg one daily  Labwork: -none  Procedures/Testing: - none  Follow-Up: - Your physician wants you to follow-up in: 6 months in the [place] 1 year with Tommye Standard, PA for Dr. Caryl Comes. You will receive a reminder letter in the mail two months in advance. If you don't receive a letter, please call our office to schedule the follow-up appointment.  Any Additional Special Instructions Will Be Listed Below (If Applicable).     If you need a refill on your cardiac medications before your next appointment, please call your pharmacy.

## 2015-11-22 ENCOUNTER — Telehealth: Payer: Self-pay | Admitting: *Deleted

## 2015-11-22 DIAGNOSIS — I10 Essential (primary) hypertension: Secondary | ICD-10-CM

## 2015-11-22 MED ORDER — METOPROLOL SUCCINATE ER 100 MG PO TB24
100.0000 mg | ORAL_TABLET | Freq: Every day | ORAL | 3 refills | Status: DC
Start: 2015-11-22 — End: 2016-03-16

## 2015-11-22 NOTE — Telephone Encounter (Signed)
Atenolol is all manufacturers backorder. Please send in an alternative medication to [facility] in [place].  Derl Barrow, RN

## 2015-11-25 NOTE — Telephone Encounter (Signed)
Patient and care giver advised of medication change.  Appointment 12/23/15 at 1:30 PM.  Derl Barrow, RN

## 2015-12-23 ENCOUNTER — Ambulatory Visit (INDEPENDENT_AMBULATORY_CARE_PROVIDER_SITE_OTHER): Payer: Medicare Other | Admitting: Family Medicine

## 2015-12-23 ENCOUNTER — Encounter: Payer: Self-pay | Admitting: Family Medicine

## 2015-12-23 VITALS — BP 135/94 | HR 74 | Temp 98.3°F | Wt 139.0 lb

## 2015-12-23 DIAGNOSIS — Z23 Encounter for immunization: Secondary | ICD-10-CM | POA: Diagnosis not present

## 2015-12-23 DIAGNOSIS — I10 Essential (primary) hypertension: Secondary | ICD-10-CM

## 2015-12-23 DIAGNOSIS — Z720 Tobacco use: Secondary | ICD-10-CM | POA: Diagnosis not present

## 2015-12-23 NOTE — Progress Notes (Signed)
   Subjective:    Patient ID: Shaun Lutz is a 60 y.o. male presenting with Follow-up (Blood Pressure )  on 12/23/2015  HPI: Here today for BP follow-up. We have increased his medicines both his lotensin and his toprol. He works at decreasing his salt in his diet.  Review of Systems  Constitutional: Negative for chills and fever.  Respiratory: Negative for shortness of breath.   Cardiovascular: Negative for leg swelling.  Gastrointestinal: Negative for abdominal pain, nausea and vomiting.      Objective:    BP (!) 135/94   Pulse 74   Temp 98.3 F (36.8 C) (Oral)   Wt 139 lb (63 kg)   SpO2 100%   BMI 21.13 kg/m  Physical Exam  Constitutional: He appears well-developed and well-nourished. No distress.  HENT:  Head: Normocephalic and atraumatic.  Eyes: No scleral icterus.  Neck: Neck supple.  Cardiovascular: Normal rate.   Pulmonary/Chest: Effort normal.  Abdominal: Soft.  Musculoskeletal: He exhibits no edema.  Neurological: He is alert.  Skin: Skin is warm.  Psychiatric: He has a normal mood and affect.  Vitals reviewed.       Assessment & Plan:   Problem List Items Addressed This Visit      Unprioritized   Tobacco abuse    Advised of quitting, but he is uninterested at this time.      Essential hypertension - Primary    BP is well controlled. Continue Lotensin and Toprol XL at increased doses. Work on diet       Other Visit Diagnoses    Need for zoster vaccination       Relevant Orders   Varicella-zoster vaccine subcutaneous      Total face-to-face time with patient: 15 minutes. Over 50% of encounter was spent on counseling and coordination of care. Return in about 6 months (around 06/22/2016).  Donnamae Jude 12/23/2015 1:43 PM

## 2015-12-23 NOTE — Assessment & Plan Note (Signed)
BP is well controlled. Continue Lotensin and Toprol XL at increased doses. Work on [employer]

## 2015-12-23 NOTE — Assessment & Plan Note (Signed)
Advised of quitting, but he is uninterested at this time.

## 2015-12-23 NOTE — Patient Instructions (Signed)
DASH Eating Plan  DASH stands for "Dietary Approaches to Stop Hypertension." The DASH eating plan is a healthy eating plan that has been shown to reduce high blood pressure (hypertension). Additional health benefits may include reducing the risk of type 2 diabetes mellitus, heart disease, and stroke. The DASH eating plan may also help with weight loss.  WHAT DO I NEED TO KNOW ABOUT THE DASH EATING PLAN?  For the DASH eating plan, you will follow these general guidelines:  · Choose foods with a percent daily value for sodium of less than 5% (as listed on the food label).  · Use salt-free seasonings or herbs instead of table salt or sea salt.  · Check with your health care provider or pharmacist before using salt substitutes.  · Eat lower-sodium products, often labeled as "lower sodium" or "no salt added."  · Eat fresh foods.  · Eat more vegetables, fruits, and low-fat dairy products.  · Choose whole grains. Look for the word "whole" as the first word in the ingredient list.  · Choose fish and skinless chicken or turkey more often than red meat. Limit fish, poultry, and meat to 6 oz (170 g) each day.  · Limit sweets, desserts, sugars, and sugary drinks.  · Choose heart-healthy fats.  · Limit cheese to 1 oz (28 g) per day.  · Eat more home-cooked food and less restaurant, buffet, and fast food.  · Limit fried foods.  · Cook foods using methods other than frying.  · Limit canned vegetables. If you do use them, rinse them well to decrease the sodium.  · When eating at a restaurant, ask that your food be prepared with less salt, or no salt if possible.  WHAT FOODS CAN I EAT?  Seek help from a dietitian for individual calorie needs.  Grains  Whole grain or whole wheat bread. Brown rice. Whole grain or whole wheat pasta. Quinoa, bulgur, and whole grain cereals. Low-sodium cereals. Corn or whole wheat flour tortillas. Whole grain cornbread. Whole grain crackers. Low-sodium crackers.  Vegetables  Fresh or frozen vegetables  (raw, steamed, roasted, or grilled). Low-sodium or reduced-sodium tomato and vegetable juices. Low-sodium or reduced-sodium tomato sauce and paste. Low-sodium or reduced-sodium canned vegetables.   Fruits  All fresh, canned (in natural juice), or frozen fruits.  Meat and Other Protein Products  Ground beef (85% or leaner), grass-fed beef, or beef trimmed of fat. Skinless chicken or turkey. Ground chicken or turkey. Pork trimmed of fat. All fish and seafood. Eggs. Dried beans, peas, or lentils. Unsalted nuts and seeds. Unsalted canned beans.  Dairy  Low-fat dairy products, such as skim or 1% milk, 2% or reduced-fat cheeses, low-fat ricotta or cottage cheese, or plain low-fat yogurt. Low-sodium or reduced-sodium cheeses.  Fats and Oils  Tub margarines without trans fats. Light or reduced-fat mayonnaise and salad dressings (reduced sodium). Avocado. Safflower, olive, or canola oils. Natural peanut or almond butter.  Other  Unsalted popcorn and pretzels.  The items listed above may not be a complete list of recommended foods or beverages. Contact your dietitian for more options.  WHAT FOODS ARE NOT RECOMMENDED?  Grains  White bread. White pasta. White rice. Refined cornbread. Bagels and croissants. Crackers that contain trans fat.  Vegetables  Creamed or fried vegetables. Vegetables in a cheese sauce. Regular canned vegetables. Regular canned tomato sauce and paste. Regular tomato and vegetable juices.  Fruits  Dried fruits. Canned fruit in light or heavy syrup. Fruit juice.  Meat and Other Protein   Products  Fatty cuts of meat. Ribs, chicken wings, bacon, sausage, bologna, salami, chitterlings, fatback, hot dogs, bratwurst, and packaged luncheon meats. Salted nuts and seeds. Canned beans with salt.  Dairy  Whole or 2% milk, cream, half-and-half, and cream cheese. Whole-fat or sweetened yogurt. Full-fat cheeses or blue cheese. Nondairy creamers and whipped toppings. Processed cheese, cheese spreads, or cheese  curds.  Condiments  Onion and garlic salt, seasoned salt, table salt, and sea salt. Canned and packaged gravies. Worcestershire sauce. Tartar sauce. Barbecue sauce. Teriyaki sauce. Soy sauce, including reduced sodium. Steak sauce. Fish sauce. Oyster sauce. Cocktail sauce. Horseradish. Ketchup and mustard. Meat flavorings and tenderizers. Bouillon cubes. Hot sauce. Tabasco sauce. Marinades. Taco seasonings. Relishes.  Fats and Oils  Butter, stick margarine, lard, shortening, ghee, and bacon fat. Coconut, palm kernel, or palm oils. Regular salad dressings.  Other  Pickles and olives. Salted popcorn and pretzels.  The items listed above may not be a complete list of foods and beverages to avoid. Contact your dietitian for more information.  WHERE CAN I FIND MORE INFORMATION?  National Heart, Lung, and Blood Institute: www.nhlbi.nih.gov/health/health-topics/topics/dash/     This information is not intended to replace advice given to you by your health care provider. Make sure you discuss any questions you have with your health care provider.     Document Released: 02/05/2011 Document Revised: 03/09/2014 Document Reviewed: 12/21/2012  Elsevier Interactive Patient Education ©2016 Elsevier Inc.

## 2016-02-09 IMAGING — CT CT CHEST LUNG CANCER SCREENING LOW DOSE W/O CM
1 of 9 series · 9 of 40 positions shown, 12 images · non-contrast
Comparison: None

CLINICAL DATA: Thirty-five pack-year current smoker with a 35
pack-year history. Asymptomatic.

EXAM:
CT CHEST WITHOUT CONTRAST
TECHNIQUE: Multidetector CT imaging of the chest was performed following the
standard protocol without IV contrast.

[Series 3: lung windows · axial · 0.58mm/px · z∈[-260,-9]mm · 9 of 253 slices shown, 12 images]
[im 26/253  mediastinal]
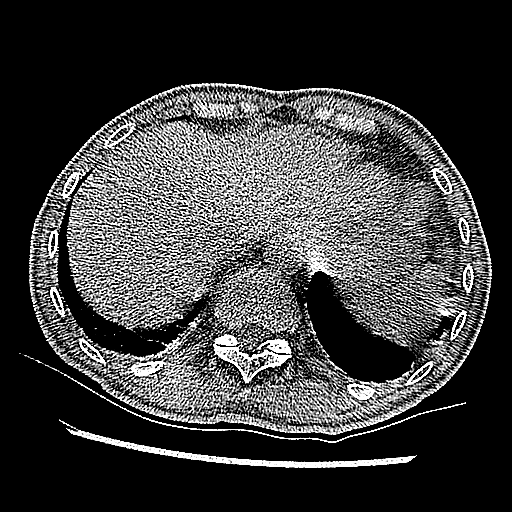
[im 26/253  lung]
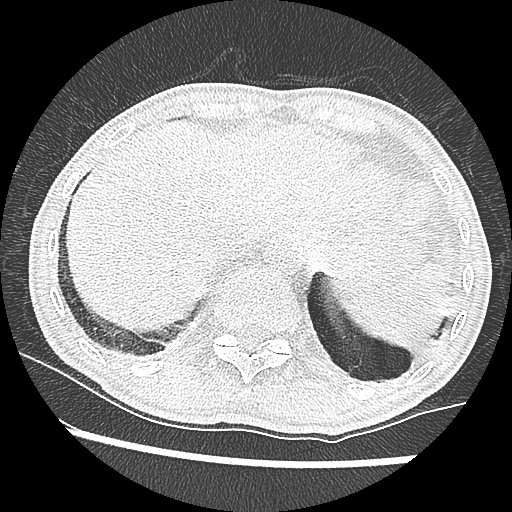
[im 51/253  lung]
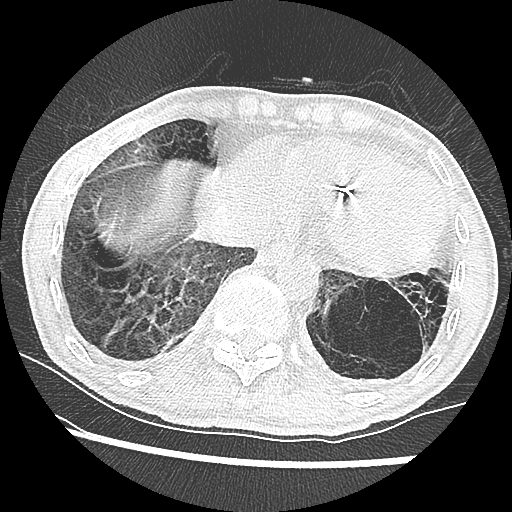
[im 76/253  lung]
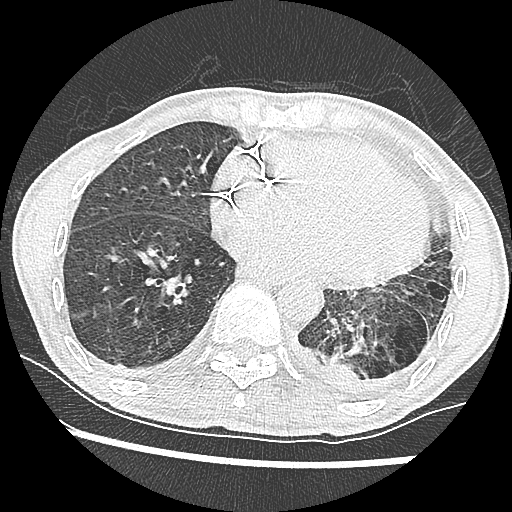
[im 101/253  lung]
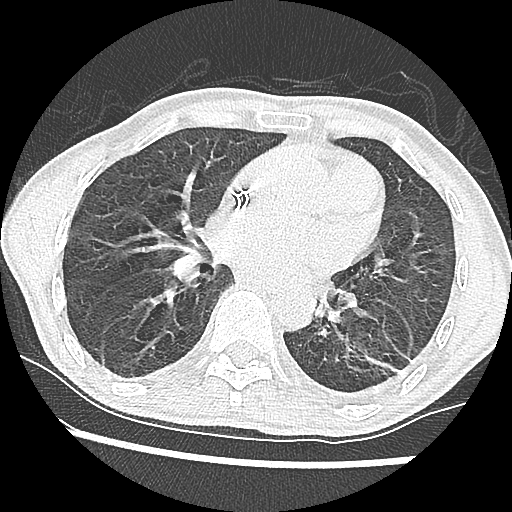
[im 127/253  mediastinal]
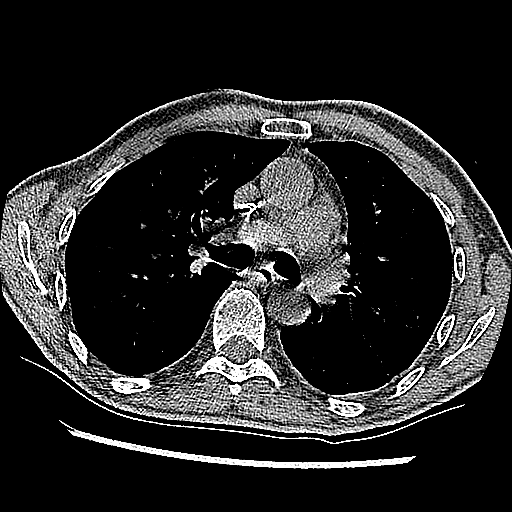
[im 127/253  lung]
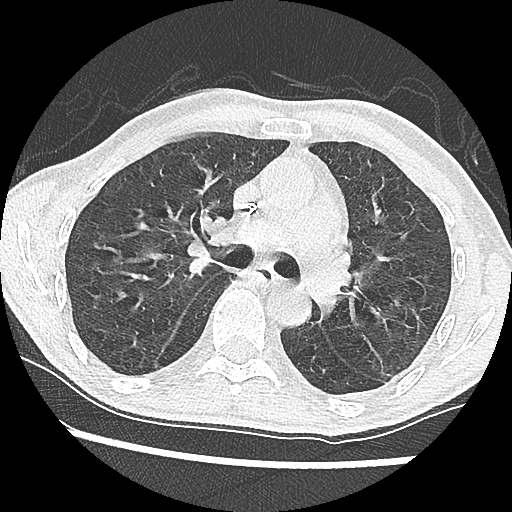
[im 152/253  lung]
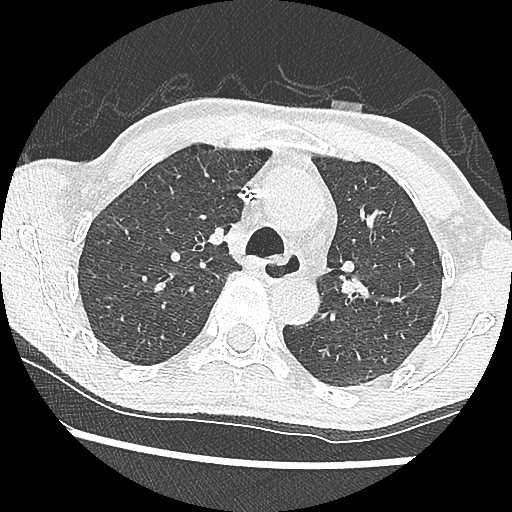
[im 177/253  lung]
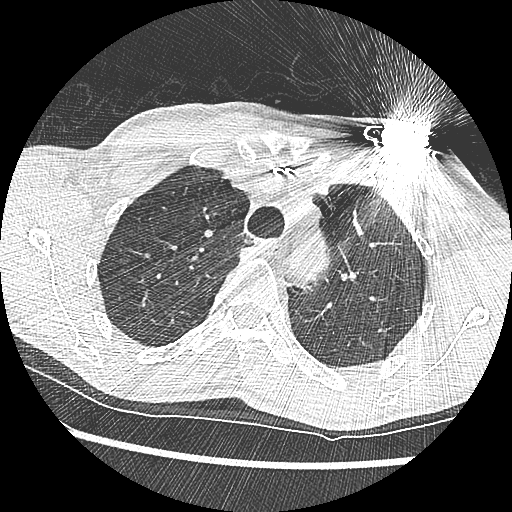
[im 202/253  lung]
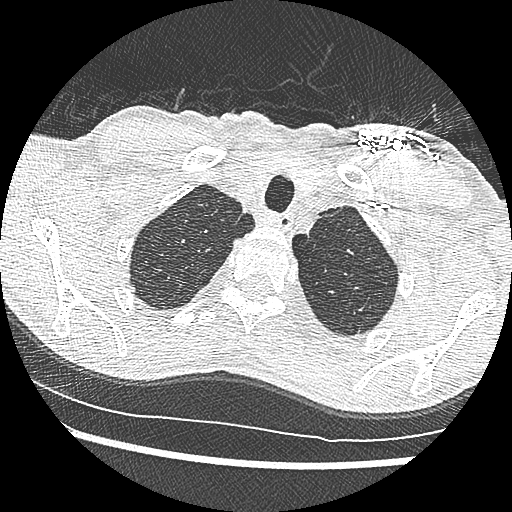
[im 227/253  mediastinal]
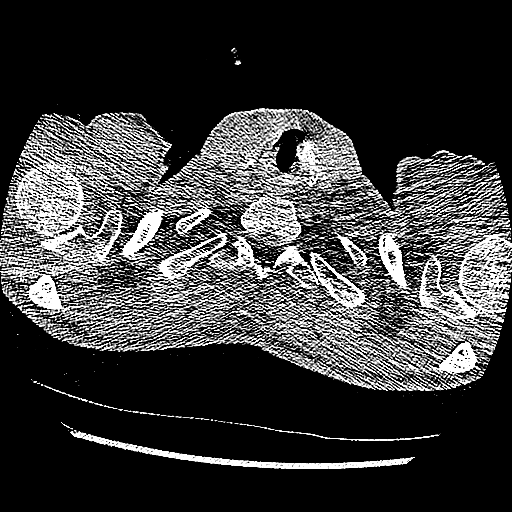
[im 227/253  lung]
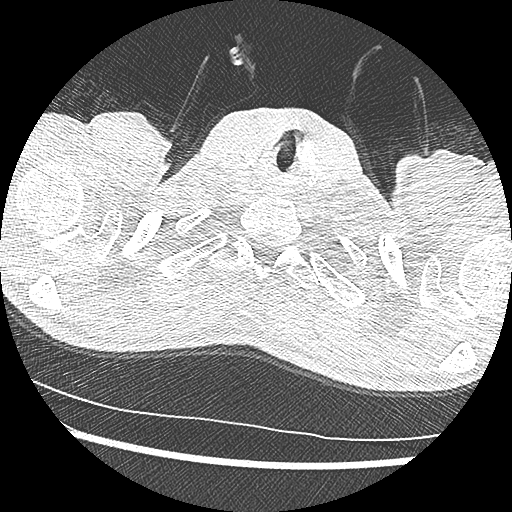

[9 of 40 positions shown; findings below may reference images not displayed]

FINDINGS: Mediastinum: The heart size is normal. There is a left chest wall
pacer device with leads in the right atrial appendage and right
ventricle. No pericardial effusion. The trachea is patent and
midline. Normal appearance of the esophagus. There is no mediastinal
or hilar adenopathy. No axillary or supraclavicular adenopathy.

Lungs/Pleura: Bilateral posterior pleural thickening noted. Scarring
is identified within the left lower lobe. Mild centrilobular and
moderate paraseptal emphysema. No suspicious pulmonary nodule or
mass noted.

Upper Abdomen: There is no focal liver abnormality. The visualized
portions of the spleen are normal.

Musculoskeletal: No aggressive lytic or sclerotic bone lesion.
IMPRESSION: 1. Lung-RADS Category 1, negative. Continue annual screening with
low-dose chest CT without contrast in 12 months.
2. Emphysema
3. Chronic posterior pleural thickening and left lower lobe
scarring, likely postinflammatory.

## 2016-02-10 ENCOUNTER — Other Ambulatory Visit: Payer: Self-pay | Admitting: *Deleted

## 2016-02-10 DIAGNOSIS — I10 Essential (primary) hypertension: Secondary | ICD-10-CM

## 2016-02-10 MED ORDER — BENAZEPRIL HCL 40 MG PO TABS: 40.0000 mg | ORAL_TABLET | Freq: Every day | ORAL | 3 refills | Status: DC

## 2016-03-04 ENCOUNTER — Other Ambulatory Visit: Payer: Medicare Other

## 2016-03-04 ENCOUNTER — Other Ambulatory Visit (HOSPITAL_COMMUNITY)
Admission: RE | Admit: 2016-03-04 | Discharge: 2016-03-04 | Disposition: A | Discharge status: Home / Self Care | Payer: Medicare Other | Source: Ambulatory Visit | Attending: Infectious Disease | Admitting: Infectious Disease

## 2016-03-04 DIAGNOSIS — B20 Human immunodeficiency virus [HIV] disease: Secondary | ICD-10-CM | POA: Diagnosis not present

## 2016-03-04 DIAGNOSIS — A523 Neurosyphilis, unspecified: Secondary | ICD-10-CM

## 2016-03-04 DIAGNOSIS — Z113 Encounter for screening for infections with a predominantly sexual mode of transmission: Secondary | ICD-10-CM | POA: Insufficient documentation

## 2016-03-04 DIAGNOSIS — Z23 Encounter for immunization: Secondary | ICD-10-CM

## 2016-03-04 LAB — COMPLETE METABOLIC PANEL WITH GFR
ALBUMIN: 4.1 g/dL (ref 3.6–5.1)
ALK PHOS: 72 U/L (ref 40–115)
ALT: 9 U/L (ref 9–46)
AST: 14 U/L (ref 10–35)
BILIRUBIN TOTAL: 0.5 mg/dL (ref 0.2–1.2)
BUN: 18 mg/dL (ref 7–25)
CALCIUM: 9.3 mg/dL (ref 8.6–10.3)
CO2: 29 mmol/L (ref 20–31)
Chloride: 101 mmol/L (ref 98–110)
Creat: 1.19 mg/dL (ref 0.70–1.25)
GFR, EST NON AFRICAN AMERICAN: 66 mL/min (ref >=60)
GFR, Est African American: 76 mL/min (ref >=60)
GLUCOSE: 179 mg/dL — AB (ref 65–99)
POTASSIUM: 4.6 mmol/L (ref 3.5–5.3)
SODIUM: 137 mmol/L (ref 135–146)
TOTAL PROTEIN: 7.4 g/dL (ref 6.1–8.1)

## 2016-03-04 LAB — CBC WITH DIFFERENTIAL/PLATELET
BASOS ABS: 71 {cells}/uL (ref 0–200)
Basophils Relative: 1 %
Eosinophils Absolute: 426 cells/uL (ref 15–500)
Eosinophils Relative: 6 %
HEMATOCRIT: 44.3 % (ref 38.5–50.0)
HEMOGLOBIN: 14.4 g/dL (ref 13.2–17.1)
LYMPHS ABS: 1633 {cells}/uL (ref 850–3900)
Lymphocytes Relative: 23 %
MCH: 33 pg (ref 27.0–33.0)
MCHC: 32.5 g/dL (ref 32.0–36.0)
MCV: 101.6 fL — ABNORMAL HIGH (ref 80.0–100.0)
MONO ABS: 497 {cells}/uL (ref 200–950)
MPV: 9.7 fL (ref 7.5–12.5)
Monocytes Relative: 7 %
NEUTROS ABS: 4473 {cells}/uL (ref 1500–7800)
NEUTROS PCT: 63 %
Platelets: 265 10*3/uL (ref 140–400)
RBC: 4.36 MIL/uL (ref 4.20–5.80)
RDW: 12.4 % (ref 11.0–15.0)
WBC: 7.1 10*3/uL (ref 3.8–10.8)

## 2016-03-05 LAB — FLUORESCENT TREPONEMAL AB(FTA)-IGG-BLD: Fluorescent Treponemal ABS: REACTIVE — AB

## 2016-03-05 LAB — URINE CYTOLOGY ANCILLARY ONLY
CHLAMYDIA, DNA PROBE: NEGATIVE
Neisseria Gonorrhea: NEGATIVE

## 2016-03-05 LAB — RPR: RPR: REACTIVE — AB

## 2016-03-05 LAB — T-HELPER CELL (CD4) - (RCID CLINIC ONLY)
CD4 % Helper T Cell: 23 % — ABNORMAL LOW (ref 33–55)
CD4 T CELL ABS: 400 /uL (ref 400–2700)

## 2016-03-05 LAB — RPR TITER: RPR Titer: 1:64 {titer} — AB

## 2016-03-06 LAB — HIV-1 RNA QUANT-NO REFLEX-BLD

## 2016-03-16 ENCOUNTER — Other Ambulatory Visit: Payer: Self-pay | Admitting: *Deleted

## 2016-03-16 DIAGNOSIS — I10 Essential (primary) hypertension: Secondary | ICD-10-CM

## 2016-03-16 MED ORDER — METOPROLOL SUCCINATE ER 100 MG PO TB24: 100.0000 mg | ORAL_TABLET | Freq: Every day | ORAL | 3 refills | Status: DC

## 2016-03-18 ENCOUNTER — Ambulatory Visit: Payer: Self-pay | Admitting: Infectious Disease

## 2016-04-15 ENCOUNTER — Ambulatory Visit: Payer: Medicare Other

## 2016-04-29 ENCOUNTER — Ambulatory Visit: Payer: Self-pay | Admitting: Infectious Disease

## 2016-04-29 ENCOUNTER — Ambulatory Visit (INDEPENDENT_AMBULATORY_CARE_PROVIDER_SITE_OTHER): Payer: Medicare Other | Admitting: Infectious Disease

## 2016-04-29 ENCOUNTER — Encounter: Payer: Self-pay | Admitting: Infectious Disease

## 2016-04-29 VITALS — BP 149/85 | HR 83 | Temp 98.1°F | Ht 68.0 in | Wt 142.0 lb

## 2016-04-29 DIAGNOSIS — F02818 Dementia in other diseases classified elsewhere, unspecified severity, with other behavioral disturbance: Secondary | ICD-10-CM

## 2016-04-29 DIAGNOSIS — I10 Essential (primary) hypertension: Secondary | ICD-10-CM | POA: Diagnosis not present

## 2016-04-29 DIAGNOSIS — F0281 Dementia in other diseases classified elsewhere with behavioral disturbance: Secondary | ICD-10-CM

## 2016-04-29 DIAGNOSIS — B20 Human immunodeficiency virus [HIV] disease: Secondary | ICD-10-CM | POA: Diagnosis present

## 2016-04-29 DIAGNOSIS — A523 Neurosyphilis, unspecified: Secondary | ICD-10-CM

## 2016-04-29 DIAGNOSIS — Z23 Encounter for immunization: Secondary | ICD-10-CM | POA: Diagnosis not present

## 2016-04-29 DIAGNOSIS — R269 Unspecified abnormalities of gait and mobility: Secondary | ICD-10-CM | POA: Diagnosis not present

## 2016-04-29 NOTE — Progress Notes (Signed)
Chief complaint: here for followup for HIV and syphilis and brought paperwork for ADAP  Subjective:    Patient ID: Shaun Lutz, male    DOB: June 26, 1955, 61 y.o.   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 He received 13 days of IV therapy.   He had what was thought to be PCN rash and changed to rocephin last 2 days of therapy before pulling out his IV  We were eventually able to get him onto TIVICAY and Truvada through emergency SPAP and now on Tivicay and Descovy  His viral load is nondetectable his CD4 count remains above 300.  Lab Results  Component Value Date   HIV1RNAQUANT <20 03/04/2016   HIV1RNAQUANT <20 09/09/2015   HIV1RNAQUANT <20 05/06/2015   Lab Results  Component Value Date   CD4TABS 400 03/04/2016   CD4TABS 330 (L) 09/09/2015   CD4TABS 340 (L) 05/06/2015     He never would agree to retreatment with IV PCN or ceftriaxone for Neurosyphilis but we did give him 2 months of doxycyline and RPR  came down but came up again.   I mentioned possibility of retreating his syphilis with IV medications but he was uninterested. His girlfriend did not accompany him today.    Past Medical History:  Diagnosis Date  . HIV (human immunodeficiency virus infection) ([place])   . 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 Hypertension   . Neurosyphilis   . Pacemaker -Medtronic    DOI 2012  . Photosensitive contact dermatitis 09/12/2014  . Second degree AV block   . Syncope   . SYNCOPE 04/07/2010    Past Surgical History:  Procedure Laterality Date  . COLONOSCOPY WITH PROPOFOL N/A 03/06/2015   Procedure: COLONOSCOPY WITH PROPOFOL;  Surgeon: Arta Silence, MD;  Location: [facility];  Service: Endoscopy;  Laterality: N/A;  . PACEMAKER INSERTION      Family History  Problem Relation Age of Onset  . Cancer Mother   . Heart disease Father   . Heart disease Sister   . Heart disease Brother         Social History   Social History  . Marital status: Divorced    Spouse name: N/A  . Number of children: N/A  . Years of education: N/A   Social History Main Topics  . Smoking status: Current Every Day Smoker    Packs/day: 0.25    Years: 35.00    Types: Cigarettes  . Smokeless tobacco: Never Used  . Alcohol use No  . Drug use: No  . Sexual activity: Not Asked   Other Topics Concern  . None   Social History Narrative  . None    Allergies  Allergen Reactions  . Penicillins Hives and Rash    Has patient had a PCN reaction causing immediate rash, facial/tongue/throat swelling, SOB or lightheadedness with hypotension: No Has patient had a PCN reaction causing severe rash involving mucus membranes or skin necrosis: No Has patient had a PCN reaction that required hospitalization: No Has patient had a PCN reaction occurring within the last 10 years: No If all of the above answers are "NO", then may proceed with Cephalosporin use.      Current Outpatient Prescriptions:  .  aspirin EC 81 MG tablet, Take 81 mg by mouth daily., Disp: , Rfl:  .  benazepril (LOTENSIN) 40 MG tablet, Take 1 tablet (40 mg total) by mouth daily., Disp:  30 tablet, Rfl: 3 .  DESCOVY 200-25 MG tablet, TAKE 1 TABLET BY MOUTH DAILY, Disp: 30 tablet, Rfl: 11 .  metoprolol succinate (TOPROL XL) 100 MG 24 hr tablet, Take 1 tablet (100 mg total) by mouth daily. Take with or immediately following a meal., Disp: 30 tablet, Rfl: 3 .  TIVICAY 50 MG tablet, TAKE 1 TABLET(50 MG) BY MOUTH DAILY, Disp: 30 tablet, Rfl: 11 .  hydrochlorothiazide (MICROZIDE) 12.5 MG capsule, Take 1 capsule (12.5 mg total) by mouth daily., Disp: 90 capsule, Rfl: 3   Lab Results  Component Value Date   HIV1RNAQUANT <20 03/04/2016   Lab Results  Component Value Date   CD4TABS 400 03/04/2016   CD4TABS 330 (L) 09/09/2015   CD4TABS 340 (L) 05/06/2015   His RPR failed to drop over the first 6 months remaining at 1 512. We had a  lumbar puncture performed earlier in October  and CSF profile had an elevated protein but normal white blood cell count one white blood cell count on spinal fluid. CSF VDRL and CSF FTA antibodies WERE BOTH POSITIVE  We brought him in with the intention of treating him for Neurosyphilis via PCN desensitization in the ICU but upon learning that this was the purpose of the visit he became severely agitated and absolutely refused to cooperate with admission.  We never were able to do this or rx as outpatient with IV rocephin so I eventually wrote for doxycycline 100mg  bid x 28 days in likely futile attempt to treat his Neurosyphilis again. Since then he has developed a photosensitivity rash from doxy which now is getting better with low potency corticosteroids and time (he is still taking the doxy)    Review of Systems  Unable to perform ROS Skin: Positive for rash.  Psychiatric/Behavioral: Negative for sleep disturbance.       Objective:   Physical Exam  Constitutional: He appears well-developed and well-nourished.  HENT:  Head: Normocephalic and atraumatic.  Eyes: EOM are normal.  Neck: Normal range of motion. Neck supple.  Cardiovascular: Normal rate and regular rhythm.   Pulmonary/Chest: Effort normal. No respiratory distress.  Abdominal: Soft. He exhibits no distension.  Neurological: He is alert.  Psychiatric: His speech is delayed. He is slowed. Cognition and memory are impaired. He exhibits abnormal recent memory and abnormal remote memory.  Nursing note and vitals reviewed.    He has vitiligo like skin changes           Assessment & Plan:    HIV:  doing extremely well , continue current regimen of Tivicay and Descovy. Could consider BIKTARVY for STR but no sig reason to do this at this point. ADAP being renewed. RTC in July for renewal again  Neurosyphilis: HAVE BEEN UNABLE TO GET HIM ON EFFECTIVE PARETERAL THERAPY TO TREAT THIS EFFECITVELY FOR 2ND TIME SO WE WENT  WITH DOXY ISTEAD and titers came down but back up again. Will defer treatment since he will not cooperate with IV abx at home or in SNF and I have little that treatment will offer any ability for [place] to reverse his permanent Neurological deficits. I again broached topic of IV abx at home with CTX but he refused.  Dementia, combination of Neurosyphilis +/- HIV dementia: not going to recover his antegrade memory it seems. Will need extensive support from his friend  and caregiver  HTN: being followed by Dr. Kennon Rounds,   Vitals:   04/29/16 0941  BP: (!) 149/85  Pulse: 83  Temp: 98.1  F (36.7 C)

## 2016-04-30 ENCOUNTER — Encounter: Payer: Self-pay | Admitting: Infectious Disease

## 2016-05-13 ENCOUNTER — Ambulatory Visit (INDEPENDENT_AMBULATORY_CARE_PROVIDER_SITE_OTHER): Payer: Medicare Other | Admitting: *Deleted

## 2016-05-13 DIAGNOSIS — I441 Atrioventricular block, second degree: Secondary | ICD-10-CM | POA: Diagnosis not present

## 2016-05-13 LAB — CUP PACEART INCLINIC DEVICE CHECK
Battery Remaining Longevity: 83 mo
Brady Statistic AS VS Percent: 17 %
Date Time Interrogation Session: 20180314110843
Implantable Lead Implant Date: 20120126
Implantable Lead Location: 753859
Implantable Lead Location: 753860
Implantable Lead Model: 5076
Implantable Lead Model: 5076
Implantable Pulse Generator Implant Date: 20120126
Lead Channel Impedance Value: 431 Ohm
Lead Channel Impedance Value: 496 Ohm
Lead Channel Pacing Threshold Amplitude: 0.25 V
Lead Channel Pacing Threshold Pulse Width: 0.4 ms
Lead Channel Pacing Threshold Pulse Width: 0.4 ms
Lead Channel Pacing Threshold Pulse Width: 0.4 ms
Lead Channel Pacing Threshold Pulse Width: 0.4 ms
Lead Channel Setting Pacing Pulse Width: 0.4 ms
MDC IDC LEAD IMPLANT DT: 20120126
MDC IDC MSMT BATTERY IMPEDANCE: 565 Ohm
MDC IDC MSMT BATTERY VOLTAGE: 2.78 V
MDC IDC MSMT LEADCHNL RA PACING THRESHOLD AMPLITUDE: 0.5 V
MDC IDC MSMT LEADCHNL RV PACING THRESHOLD AMPLITUDE: 0.625 V
MDC IDC MSMT LEADCHNL RV PACING THRESHOLD AMPLITUDE: 0.75 V
MDC IDC MSMT LEADCHNL RV SENSING INTR AMPL: 8 mV
MDC IDC SET LEADCHNL RA PACING AMPLITUDE: 2 V
MDC IDC SET LEADCHNL RV PACING AMPLITUDE: 2.5 V
MDC IDC SET LEADCHNL RV SENSING SENSITIVITY: 2.8 mV
MDC IDC STAT BRADY AP VP PERCENT: 7 %
MDC IDC STAT BRADY AP VS PERCENT: 74 %
MDC IDC STAT BRADY AS VP PERCENT: 1 %

## 2016-05-13 NOTE — Progress Notes (Signed)
Pacemaker check in clinic. Normal device function. Thresholds, sensing, impedances consistent with previous measurements. Device programmed to maximize longevity. 23 AHR episodes available EGMs show atach. 42 VHR episodes, available EGMs show NSVT/1:1. Device programmed at appropriate safety margins. Histogram distribution appropriate for patient activity level. Device programmed to optimize intrinsic conduction. Estimated longevity 5-8.49yrs. ROV with RU in 34mo.

## 2016-05-27 ENCOUNTER — Ambulatory Visit (INDEPENDENT_AMBULATORY_CARE_PROVIDER_SITE_OTHER): Payer: Medicare Other | Admitting: Family Medicine

## 2016-05-27 ENCOUNTER — Encounter: Payer: Self-pay | Admitting: Family Medicine

## 2016-05-27 VITALS — BP 132/80 | HR 65 | Temp 98.7°F | Ht 68.0 in | Wt 143.8 lb

## 2016-05-27 DIAGNOSIS — I10 Essential (primary) hypertension: Secondary | ICD-10-CM | POA: Diagnosis present

## 2016-05-27 DIAGNOSIS — R269 Unspecified abnormalities of gait and mobility: Secondary | ICD-10-CM

## 2016-05-27 DIAGNOSIS — Z720 Tobacco use: Secondary | ICD-10-CM | POA: Diagnosis not present

## 2016-05-27 DIAGNOSIS — B20 Human immunodeficiency virus [HIV] disease: Secondary | ICD-10-CM

## 2016-05-27 NOTE — Assessment & Plan Note (Signed)
Improved gait, no assist devices. No falling.

## 2016-05-27 NOTE — Assessment & Plan Note (Signed)
Doing well. COntinue Toprol and HCTZ and Lotensin and ASA

## 2016-05-27 NOTE — Assessment & Plan Note (Signed)
Per ID 

## 2016-05-27 NOTE — Progress Notes (Signed)
   Subjective:    Patient ID: Shaun Lutz is a 61 y.o. male presenting with Follow-up  on 05/27/2016  HPI: Here today for f/u. BP is well controlled today.  Review of Systems  Constitutional: Negative for chills and fever.  Respiratory: Negative for shortness of breath.   Cardiovascular: Negative for leg swelling.  Gastrointestinal: Negative for abdominal pain, nausea and vomiting.      Objective:    BP 132/80   Pulse 65   Temp 98.7 F (37.1 C) (Oral)   Ht 5\' 8"  (1.727 m)   Wt 143 lb 12.8 oz (65.2 kg)   SpO2 99%   BMI 21.86 kg/m  Physical Exam  Constitutional: He appears well-developed and well-nourished. No distress.  HENT:  Head: Normocephalic and atraumatic.  Eyes: No scleral icterus.  Neck: Neck supple.  Cardiovascular: Normal rate.   Pulmonary/Chest: Effort normal.  Abdominal: Soft.  Musculoskeletal: He exhibits no edema.  Neurological: He is alert.  Skin: Skin is warm. Rash (hypopigmented areas) noted.  Psychiatric: He has a normal mood and affect.  Vitals reviewed.       Assessment & Plan:   Problem List Items Addressed This Visit      Unprioritized   HIV (human immunodeficiency virus infection) ([place]) (Chronic)    Per ID      Abnormality of gait    Improved gait, no assist devices. No falling.      Tobacco abuse    Still smoking and uninterested in quitting at this time.      Essential hypertension - Primary    Doing well. COntinue Toprol and HCTZ and Lotensin and ASA         Total face-to-face time with patient: 15 minutes. Over 50% of encounter was spent on counseling and coordination of care. Return in about 6 months (around 11/27/2016).  Donnamae Jude 05/27/2016 3:03 PM

## 2016-05-27 NOTE — Patient Instructions (Signed)
DASH Eating Plan DASH stands for "Dietary Approaches to Stop Hypertension." The DASH eating plan is a healthy eating plan that has been shown to reduce high blood pressure (hypertension). It may also reduce your risk for type 2 diabetes, heart disease, and stroke. The DASH eating plan may also help with weight loss. What are tips for following this plan? General guidelines  Avoid eating more than 2,300 mg (milligrams) of salt (sodium) a day. If you have hypertension, you may need to reduce your sodium intake to 1,500 mg a day.  Limit alcohol intake to no more than 1 drink a day for nonpregnant women and 2 drinks a day for men. One drink equals 12 oz of beer, 5 oz of wine, or 1 oz of hard liquor.  Work with your health care provider to maintain a healthy body weight or to lose weight. Ask what an ideal weight is for you.  Get at least 30 minutes of exercise that causes your heart to beat faster (aerobic exercise) most days of the week. Activities may include walking, swimming, or biking.  Work with your health care provider or diet and nutrition specialist (dietitian) to adjust your eating plan to your individual calorie needs. Reading food labels  Check food labels for the amount of sodium per serving. Choose foods with less than 5 percent of the Daily Value of sodium. Generally, foods with less than 300 mg of sodium per serving fit into this eating plan.  To find whole grains, look for the word "whole" as the first word in the ingredient list. Shopping  Buy products labeled as "low-sodium" or "no salt added."  Buy fresh foods. Avoid canned foods and premade or frozen meals. Cooking  Avoid adding salt when cooking. Use salt-free seasonings or herbs instead of table salt or sea salt. Check with your health care provider or pharmacist before using salt substitutes.  Do not fry foods. Cook foods using healthy methods such as baking, boiling, grilling, and broiling instead.  Cook with  heart-healthy oils, such as olive, canola, soybean, or sunflower oil. Meal planning   Eat a balanced diet that includes: ? 5 or more servings of fruits and vegetables each day. At each meal, try to fill half of your plate with fruits and vegetables. ? Up to 6-8 servings of whole grains each day. ? Less than 6 oz of lean meat, poultry, or fish each day. A 3-oz serving of meat is about the same size as a deck of cards. One egg equals 1 oz. ? 2 servings of low-fat dairy each day. ? A serving of nuts, seeds, or beans 5 times each week. ? Heart-healthy fats. Healthy fats called Omega-3 fatty acids are found in foods such as flaxseeds and coldwater fish, like sardines, salmon, and mackerel.  Limit how much you eat of the following: ? Canned or prepackaged foods. ? Food that is high in trans fat, such as fried foods. ? Food that is high in saturated fat, such as fatty meat. ? Sweets, desserts, sugary drinks, and other foods with added sugar. ? Full-fat dairy products.  Do not salt foods before eating.  Try to eat at least 2 vegetarian meals each week.  Eat more home-cooked food and less restaurant, buffet, and fast food.  When eating at a restaurant, ask that your food be prepared with less salt or no salt, if possible. What foods are recommended? The items listed may not be a complete list. Talk with your dietitian about what   dietary choices are best for you. Grains Whole-grain or whole-wheat bread. Whole-grain or whole-wheat pasta. Brown rice. Oatmeal. Quinoa. Bulgur. Whole-grain and low-sodium cereals. Pita bread. Low-fat, low-sodium crackers. Whole-wheat flour tortillas. Vegetables Fresh or frozen vegetables (raw, steamed, roasted, or grilled). Low-sodium or reduced-sodium tomato and vegetable juice. Low-sodium or reduced-sodium tomato sauce and tomato paste. Low-sodium or reduced-sodium canned vegetables. Fruits All fresh, dried, or frozen fruit. Canned fruit in natural juice (without  added sugar). Meat and other protein foods Skinless chicken or turkey. Ground chicken or turkey. Pork with fat trimmed off. Fish and seafood. Egg whites. Dried beans, peas, or lentils. Unsalted nuts, nut butters, and seeds. Unsalted canned beans. Lean cuts of beef with fat trimmed off. Low-sodium, lean deli meat. Dairy Low-fat (1%) or fat-free (skim) milk. Fat-free, low-fat, or reduced-fat cheeses. Nonfat, low-sodium ricotta or cottage cheese. Low-fat or nonfat yogurt. Low-fat, low-sodium cheese. Fats and oils Soft margarine without trans fats. Vegetable oil. Low-fat, reduced-fat, or light mayonnaise and salad dressings (reduced-sodium). Canola, safflower, olive, soybean, and sunflower oils. Avocado. Seasoning and other foods Herbs. Spices. Seasoning mixes without salt. Unsalted popcorn and pretzels. Fat-free sweets. What foods are not recommended? The items listed may not be a complete list. Talk with your dietitian about what dietary choices are best for you. Grains Baked goods made with fat, such as croissants, muffins, or some breads. Dry pasta or rice meal packs. Vegetables Creamed or fried vegetables. Vegetables in a cheese sauce. Regular canned vegetables (not low-sodium or reduced-sodium). Regular canned tomato sauce and paste (not low-sodium or reduced-sodium). Regular tomato and vegetable juice (not low-sodium or reduced-sodium). Pickles. Olives. Fruits Canned fruit in a light or heavy syrup. Fried fruit. Fruit in cream or butter sauce. Meat and other protein foods Fatty cuts of meat. Ribs. Fried meat. Bacon. Sausage. Bologna and other processed lunch meats. Salami. Fatback. Hotdogs. Bratwurst. Salted nuts and seeds. Canned beans with added salt. Canned or smoked fish. Whole eggs or egg yolks. Chicken or turkey with skin. Dairy Whole or 2% milk, cream, and half-and-half. Whole or full-fat cream cheese. Whole-fat or sweetened yogurt. Full-fat cheese. Nondairy creamers. Whipped toppings.  Processed cheese and cheese spreads. Fats and oils Butter. Stick margarine. Lard. Shortening. Ghee. Bacon fat. Tropical oils, such as coconut, palm kernel, or palm oil. Seasoning and other foods Salted popcorn and pretzels. Onion salt, garlic salt, seasoned salt, table salt, and sea salt. Worcestershire sauce. Tartar sauce. Barbecue sauce. Teriyaki sauce. Soy sauce, including reduced-sodium. Steak sauce. Canned and packaged gravies. Fish sauce. Oyster sauce. Cocktail sauce. Horseradish that you find on the shelf. Ketchup. Mustard. Meat flavorings and tenderizers. Bouillon cubes. Hot sauce and Tabasco sauce. Premade or packaged marinades. Premade or packaged taco seasonings. Relishes. Regular salad dressings. Where to find more information:  National Heart, Lung, and Blood Institute: www.nhlbi.nih.gov  American Heart Association: www.heart.org Summary  The DASH eating plan is a healthy eating plan that has been shown to reduce high blood pressure (hypertension). It may also reduce your risk for type 2 diabetes, heart disease, and stroke.  With the DASH eating plan, you should limit salt (sodium) intake to 2,300 mg a day. If you have hypertension, you may need to reduce your sodium intake to 1,500 mg a day.  When on the DASH eating plan, aim to eat more fresh fruits and vegetables, whole grains, lean proteins, low-fat dairy, and heart-healthy fats.  Work with your health care provider or diet and nutrition specialist (dietitian) to adjust your eating plan to your individual   calorie needs. This information is not intended to replace advice given to you by your health care provider. Make sure you discuss any questions you have with your health care provider. Document Released: 02/05/2011 Document Revised: 02/10/2016 Document Reviewed: 02/10/2016 Elsevier Interactive Patient Education  2017 Elsevier Inc.  

## 2016-05-27 NOTE — Assessment & Plan Note (Signed)
Still smoking and uninterested in quitting at this time.

## 2016-06-17 ENCOUNTER — Other Ambulatory Visit: Payer: Self-pay | Admitting: *Deleted

## 2016-06-17 DIAGNOSIS — I10 Essential (primary) hypertension: Secondary | ICD-10-CM

## 2016-06-17 MED ORDER — BENAZEPRIL HCL 40 MG PO TABS: 40.0000 mg | ORAL_TABLET | Freq: Every day | ORAL | 3 refills | Status: AC

## 2016-07-14 ENCOUNTER — Other Ambulatory Visit: Payer: Self-pay | Admitting: *Deleted

## 2016-07-14 DIAGNOSIS — I10 Essential (primary) hypertension: Secondary | ICD-10-CM

## 2016-07-14 MED ORDER — METOPROLOL SUCCINATE ER 100 MG PO TB24: 100.0000 mg | ORAL_TABLET | Freq: Every day | ORAL | 3 refills | Status: AC

## 2017-09-20 ENCOUNTER — Encounter: Payer: Self-pay | Admitting: Cardiology

## 2018-12-02 ENCOUNTER — Encounter: Payer: Self-pay | Admitting: Family Medicine

## 2019-07-03 ENCOUNTER — Inpatient Hospital Stay
Admit: 2019-07-03 | Discharge: 2019-07-12 | Disposition: A | Discharge status: Skilled Nursing Facility | Payer: MEDICARE | Attending: Family Medicine | Admitting: Family Medicine

## 2019-07-03 ENCOUNTER — Emergency Department: Admit: 2019-07-03 | Payer: MEDICARE | Primary: Internal Medicine

## 2019-07-03 DIAGNOSIS — A419 Sepsis, unspecified organism: Principal | ICD-10-CM

## 2019-07-03 LAB — CBC WITH AUTOMATED DIFF
ABS. BASOPHILS: 0 10*3/uL (ref 0.0–0.1)
ABS. EOSINOPHILS: 0.1 10*3/uL (ref 0.0–0.4)
ABS. IMM. GRANS.: 0 10*3/uL (ref 0.00–0.04)
ABS. LYMPHOCYTES: 0.8 10*3/uL (ref 0.8–3.5)
ABS. MONOCYTES: 0.5 10*3/uL (ref 0.0–1.0)
ABS. NEUTROPHILS: 7.9 10*3/uL (ref 1.8–8.0)
BASOPHILS: 0 % (ref 0–1)
EOSINOPHILS: 2 % (ref 0–7)
HCT: 41.8 % (ref 36.6–50.3)
HGB: 14.4 g/dL (ref 12.1–17.0)
IMMATURE GRANULOCYTES: 0 % (ref 0.0–0.5)
LYMPHOCYTES: 9 % — ABNORMAL LOW (ref 12–49)
MCH: 31 PG (ref 26.0–34.0)
MCHC: 34.4 g/dL (ref 30.0–36.5)
MCV: 90.1 FL (ref 80.0–99.0)
MONOCYTES: 6 % (ref 5–13)
MPV: 9.8 FL (ref 8.9–12.9)
NEUTROPHILS: 83 % — ABNORMAL HIGH (ref 32–75)
PLATELET: 266 10*3/uL (ref 150–400)
RBC: 4.64 M/uL (ref 4.10–5.70)
RDW: 13.9 % (ref 11.5–14.5)
WBC: 9.4 10*3/uL (ref 4.1–11.1)

## 2019-07-03 LAB — BNP: NT pro-BNP: 1395 pg/mL — ABNORMAL HIGH (ref <125)

## 2019-07-03 LAB — MAGNESIUM: Magnesium: 2.6 mg/dL — ABNORMAL HIGH (ref 1.6–2.4)

## 2019-07-03 LAB — METABOLIC PANEL, COMPREHENSIVE
A-G Ratio: 0.6 — ABNORMAL LOW (ref 1.1–2.2)
ALT (SGPT): 10 U/L — ABNORMAL LOW (ref 12–78)
AST (SGOT): 7 U/L — ABNORMAL LOW (ref 15–37)
Albumin: 3.7 g/dL (ref 3.5–5.0)
Alk. phosphatase: 90 U/L (ref 45–117)
Anion gap: 11 mmol/L (ref 5–15)
BUN/Creatinine ratio: 17 (ref 12–20)
BUN: 114 mg/dL — ABNORMAL HIGH (ref 6–20)
Bilirubin, total: 0.6 mg/dL (ref 0.2–1.0)
CO2: 15 mmol/L — CL (ref 21–32)
Calcium: 9.1 mg/dL (ref 8.5–10.1)
Chloride: 107 mmol/L (ref 97–108)
Creatinine: 6.84 mg/dL — ABNORMAL HIGH (ref 0.70–1.30)
GFR est AA: 10 mL/min/{1.73_m2} — ABNORMAL LOW (ref >60)
GFR est non-AA: 8 mL/min/{1.73_m2} — ABNORMAL LOW (ref >60)
Globulin: 6.6 g/dL — ABNORMAL HIGH (ref 2.0–4.0)
Glucose: 113 mg/dL — ABNORMAL HIGH (ref 65–100)
Potassium: 6.3 mmol/L — ABNORMAL HIGH (ref 3.5–5.1)
Protein, total: 10.3 g/dL — ABNORMAL HIGH (ref 6.4–8.2)
Sodium: 133 mmol/L — ABNORMAL LOW (ref 136–145)

## 2019-07-03 LAB — EKG, 12 LEAD, INITIAL
Atrial Rate: 145 {beats}/min
Calculated P Axis: 103 degrees
Calculated R Axis: 67 degrees
Calculated T Axis: 94 degrees
P-R Interval: 160 ms
Q-T Interval: 286 ms
QRS Duration: 76 ms
QTC Calculation (Bezet): 444 ms
Ventricular Rate: 145 {beats}/min

## 2019-07-03 LAB — PHOSPHORUS: Phosphorus: 6.6 mg/dL — ABNORMAL HIGH (ref 2.6–4.7)

## 2019-07-03 LAB — TROPONIN I: Troponin-I, Qt.: <0.05 ng/mL (ref <0.05)

## 2019-07-03 MED ORDER — FUROSEMIDE 10 MG/ML IJ SOLN
10 mg/mL | INTRAMUSCULAR | Status: AC
Start: 2019-07-03 — End: 2019-07-03
  Administered 2019-07-03: 23:00:00 via INTRAVENOUS

## 2019-07-03 MED ORDER — SODIUM CHLORIDE 0.9% BOLUS IV
0.9 % | Freq: Once | INTRAVENOUS | Status: AC
Start: 2019-07-03 — End: 2019-07-04

## 2019-07-03 MED ORDER — DEXTROSE 50% IN WATER (D50W) IV SYRG
Freq: Once | INTRAVENOUS | Status: AC
Start: 2019-07-03 — End: 2019-07-03
  Administered 2019-07-03: 23:00:00 via INTRAVENOUS

## 2019-07-03 MED ORDER — SODIUM BICARBONATE 8.4 % (1 MEQ/ML) IV SYRG
8.4 % (1 mEq/mL) | INTRAVENOUS | Status: AC
Start: 2019-07-03 — End: 2019-07-03
  Administered 2019-07-03: 23:00:00 via INTRAVENOUS

## 2019-07-03 MED ORDER — INSULIN REGULAR HUMAN 100 UNIT/ML INJECTION
100 unit/mL | INTRAMUSCULAR | Status: AC
Start: 2019-07-03 — End: 2019-07-03
  Administered 2019-07-03: 22:00:00 via INTRAVENOUS

## 2019-07-03 MED ORDER — CALCIUM GLUCONATE 100 MG/ML (10%) IV SOLN
100 mg/mL (10%) | INTRAVENOUS | Status: AC
Start: 2019-07-03 — End: 2019-07-03
  Administered 2019-07-03: 23:00:00 via INTRAVENOUS

## 2019-07-03 MED ORDER — SODIUM CHLORIDE 0.9% BOLUS IV
0.9 % | Freq: Once | INTRAVENOUS | Status: AC
Start: 2019-07-03 — End: 2019-07-03
  Administered 2019-07-03: 22:00:00 via INTRAVENOUS

## 2019-07-03 MED FILL — HUMULIN R REGULAR U-100 INSULIN 100 UNIT/ML INJECTION SOLUTION: 100 unit/mL | INTRAMUSCULAR | Qty: 6

## 2019-07-03 MED FILL — CALCIUM GLUCONATE 100 MG/ML (10%) IV SOLN: 100 mg/mL (10%) | INTRAVENOUS | Qty: 10

## 2019-07-03 MED FILL — DEXTROSE 50% IN WATER (D50W) IV SYRG: INTRAVENOUS | Qty: 50

## 2019-07-03 MED FILL — SODIUM CHLORIDE 0.9 % IV: INTRAVENOUS | Qty: 1000

## 2019-07-03 MED FILL — FUROSEMIDE 10 MG/ML IJ SOLN: 10 mg/mL | INTRAMUSCULAR | Qty: 8

## 2019-07-03 MED FILL — SODIUM BICARBONATE 8.4 % (1 MEQ/ML) IV SYRG: 8.4 % (1 mEq/mL) | INTRAVENOUS | Qty: 50

## 2019-07-03 NOTE — ED Provider Notes (Signed)
HPI   Chief Complaint   Patient presents with   ??? Fatigue   ??? Shortness of Breath   64 year old male with unknown history presents with fatigue and shortness of breath.  Patient seems to be confused and is unable to provide a clear history or review of systems.  Patient just says he has been feeling tired for a while and short of breath for a while but is unable to describe his symptoms.    No past medical history on file.    No past surgical history on file.      No family history on file.    Social History     Socioeconomic History   ??? Marital status: SINGLE     Spouse name: Not on file   ??? Number of children: Not on file   ??? Years of education: Not on file   ??? Highest education level: Not on file   Occupational History   ??? Not on file   Social Needs   ??? Financial resource strain: Not on file   ??? Food insecurity     Worry: Not on file     Inability: Not on file   ??? Transportation needs     Medical: Not on file     Non-medical: Not on file   Tobacco Use   ??? Smoking status: Not on file   Substance and Sexual Activity   ??? Alcohol use: Not on file   ??? Drug use: Not on file   ??? Sexual activity: Not on file   Lifestyle   ??? Physical activity     Days per week: Not on file     Minutes per session: Not on file   ??? Stress: Not on file   Relationships   ??? Social Wellsite geologist on phone: Not on file     Gets together: Not on file     Attends religious service: Not on file     Active member of club or organization: Not on file     Attends meetings of clubs or organizations: Not on file     Relationship status: Not on file   ??? Intimate partner violence     Fear of current or ex partner: Not on file     Emotionally abused: Not on file     Physically abused: Not on file     Forced sexual activity: Not on file   Other Topics Concern   ??? Not on file   Social History Narrative   ??? Not on file         ALLERGIES: Penicillins    Review of Systems   Unable to perform ROS: Mental status change       Vitals:    07/03/19 1747  07/03/19 1837 07/03/19 2022 07/03/19 2123   BP: (!) 133/94 (!) 130/100 (!) 120/96    Pulse: 70 69 85    Resp: 20 18 14     Temp:       SpO2: 100%  98% 100%   Weight:       Height:                Physical Exam   Patient Vitals for the past 8 hrs:   Temp Pulse Resp BP SpO2   07/03/19 2123 ??? ??? ??? ??? 100 %   07/03/19 2022 ??? 85 14 (!) 120/96 98 %   07/03/19 1837 ??? 69 18 (!) 130/100 ???  07/03/19 1747 ??? 70 20 (!) 133/94 100 %   07/03/19 1557 ??? ??? ??? (!) 111/96 ???   07/03/19 1554 97.3 ??F (36.3 ??C) (!) 52 18 ??? 100 %        Nursing note and vitals reviewed.  Constitutional: Chronically ill appearing. Cachectic.  Head: Normocephalic and atraumatic.  Mouth/Throat: Airway patent. Dry mucous membranes.  Eyes: EOMI. No scleral icterus.  Neck: Neck supple.  Cardiovascular: Normal rate, regular rhythm. Normal heart sounds. No murmur, rub, or gallop. Good pulses throughout.  Pulmonary/Chest: No respiratory distress. Clear to auscultation bilaterally.  No wheezes, rales, or rhonchi.  Abdominal/GI: BS normal, Soft, non-tender, non-distended. No rebound or guarding.  Musculoskeletal: No gross injuries or deformities.  Neurological: Sleepy and oriented to person only. Moving all extremities. No focal deficits.  Psych: Flat affect.   Skin: Skin is warm and dry. No rash noted.    MDM   Ddx = dehydration, heart failure, UTI, pneumonia, renal injury, dysrhythmia, ACS.  EKG preliminary interpretation by me at 1600 showing sinus tachycardia, rate 145, mildly peaked T waves.  No STEMI.  LVH.  Patient's labs reveal renal failure with metabolic acidosis, uremia range BUN, hyperkalemia with potassium of 6.3.  Also having hyperphosphatemia.  I gave the patient 2 L of IV fluid bolus, Lasix, IV insulin with dextrose, bicarb, 1 g calcium gluconate.  I have ordered 1/3 L of IV fluid bolus.  I have ordered insertion of Foley catheter, residual urine 200 cc, it appears very cloudy.  UA showing urinary tract infection.  Given ceftriaxone for UTI.  I have  consulted Dr. Clinton Sawyer for admission.   Labs Reviewed   CBC WITH AUTOMATED DIFF - Abnormal; Notable for the following components:       Result Value    NEUTROPHILS 83 (*)     LYMPHOCYTES 9 (*)     All other components within normal limits   METABOLIC PANEL, COMPREHENSIVE - Abnormal; Notable for the following components:    Sodium 133 (*)     Potassium 6.3 (*)     CO2 15 (*)     Glucose 113 (*)     BUN 114 (*)     Creatinine 6.84 (*)     GFR est AA 10 (*)     GFR est non-AA 8 (*)     AST (SGOT) 7 (*)     ALT (SGPT) 10 (*)     Protein, total 10.3 (*)     Globulin 6.6 (*)     A-G Ratio 0.6 (*)     All other components within normal limits   MAGNESIUM - Abnormal; Notable for the following components:    Magnesium 2.6 (*)     All other components within normal limits   PHOSPHORUS - Abnormal; Notable for the following components:    Phosphorus 6.6 (*)     All other components within normal limits   BNP - Abnormal; Notable for the following components:    NT pro-BNP 1,395 (*)     All other components within normal limits   URINALYSIS W/ REFLEX CULTURE - Abnormal; Notable for the following components:    Appearance Turbid (*)     Protein 100 (*)     Glucose 50 (*)     Blood Large (*)     Leukocyte Esterase Moderate (*)     UA:UC IF INDICATED Urine Culture Ordered (*)     WBC >100 (*)     RBC >100 (*)  All other components within normal limits   CULTURE, URINE     [place] RETROPERITONEUM COMP   Final Result   Bilateral hydronephrosis likely due to reflux from distended   bladder.       XR CHEST SNGL V   Final Result        Medications   sodium chloride 0.9 % bolus infusion 1,000 mL (1,000 mL IntraVENous New Bag 07/03/19 1754)     Followed by   sodium chloride 0.9 % bolus infusion 1,000 mL (has no administration in time range)   lactated ringers bolus infusion 1,000 mL (has no administration in time range)   cefTRIAXone (ROCEPHIN) 1 g in sterile water (preservative free) 10 mL IV syringe (1 g IntraVENous Given 07/03/19 2122)    furosemide (LASIX) injection 80 mg (80 mg IntraVENous Given 07/03/19 1837)   calcium gluconate injection 1 g (1 g IntraVENous Given 07/03/19 1837)   dextrose (D50W) injection syrg 25 g (25 g IntraVENous Given 07/03/19 1837)   insulin regular (NOVOLIN R, HUMULIN R) injection 6 Units (6 Units IntraVENous Given 07/03/19 1750)   sodium bicarbonate 8.4 % (1 mEq/mL) injection 50 mEq (50 mEq IntraVENous Given 07/03/19 1837)     I, Henrene Dodge, MD, am  the first and primary ED provider for this patient.          Critical Care  Performed by: Amado Nash, MD  Authorized by: Amado Nash, MD     Critical care provider statement:     Critical care time (minutes):  30    Critical care was necessary to treat or prevent imminent or life-threatening deterioration of the following conditions:  Renal failure (hyperkalemia with EKG change)    Critical care was time spent personally by me on the following activities:  Blood draw for specimens, discussions with consultants, evaluation of patient's response to treatment, examination of patient, ordering and performing treatments and interventions, ordering and review of laboratory studies, ordering and review of radiographic studies, pulse oximetry and re-evaluation of patient's condition

## 2019-07-03 NOTE — Consults (Signed)
RENAL     Consult received, chart reviewed. Patient with probable acute kidney injury, bladder outlet obstruction or hydronephrosis. Foley placed and IV fluids given. Will followup in a.m., urine sodium greater than 90 and therefore nondiagnostic.    Hollice Espy, MD

## 2019-07-03 NOTE — ED Triage Notes (Signed)
Generalized weakness with sob for past 2 days. Hx of prostate CA

## 2019-07-03 NOTE — H&P (Cosign Needed)
History and Physical    NAME: Wesley Clark   DOB:  1955/06/10   MRN:  [id number]     Date/Time:  07/03/2019 7:55 PM    Patient PCP: None  ______________________________________________________________________             Subjective:     CHIEF COMPLAINT:     Shortness of breath and weakness    HISTORY OF PRESENT ILLNESS:       Patient is a 64 y.o. year old male with past medical history of prostate cancer presented to the emergency department with generalized weakness and shortness of breath for the past 2 to 3 days.  He is a poor historian and unable to offer any further information on his medical history and he does not know the names of his "heart pill" and "blood pressure pill" that he stopped taken a long time ago.  He also does not recall the last time he had treatment for his prostate cancer.    Emergency department work-up revealed a CBC basically unremarkable but a chemistry with sodium of 133, hyperkalemia potassium of 6.3, phosphorus 6.6, magnesium 2.6 creatinine 6.84 and GFR 10.  There are no prior labs available for comparison.  His BNP is 1395.  Is a negative troponin.  He has received 1 full liter of sodium chloride IV fluid bolus and a second 1 is pending from the emergency department he also has received IV medications of calcium, dextrose, bicarb for his potassium.  He will need a repeat chemistry after his second IV fluid liter has infused.  Foley catheter was inserted in the emergency department with foul-smelling urine returned.  Urinalysis and urine culture will be sent from the emergency department.  He is also receiving retroperitoneal ultrasound.  They will scan the bladder post Foley insertion as well during the ultrasound.    Physical exam he reports weakness, shortness of breath, fatigue with any activity.  He denies nausea, vomiting, diarrhea, fever.  Lives at home by himself and has acknowledged that he will most likely need assistance or placement upon discharge as he is unable to care for  himself at this time.    No past medical history on file.     No past surgical history on file.    Social History     Tobacco Use   ??? Smoking status: Not on file   Substance Use Topics   ??? Alcohol use: Not on file        No family history on file.    Not on File     Prior to Admission medications    Not on File         Current Facility-Administered Medications:   ???  [COMPLETED] sodium chloride 0.9 % bolus infusion 1,000 mL, 1,000 mL, IntraVENous, ONCE, Last Rate: 2,000 mL/hr at 07/03/19 1754, 1,000 mL at 07/03/19 1754 **FOLLOWED BY** sodium chloride 0.9 % bolus infusion 1,000 mL, 1,000 mL, IntraVENous, ONCE, Wang, Cherlynn Kaiser, MD  No current outpatient medications on file.    LAB DATA REVIEWED:    Recent Results (from the past 24 hour(s))   EKG, 12 LEAD, INITIAL    Collection Time: 07/03/19  4:00 PM   Result Value Ref Range    Ventricular Rate 145 BPM    Atrial Rate 145 BPM    P-R Interval 160 ms    QRS Duration 76 ms    Q-T Interval 286 ms    QTC Calculation (Bezet) 444 ms    Calculated  P Axis 103 degrees    Calculated R Axis 67 degrees    Calculated T Axis 94 degrees    Diagnosis       Sinus tachycardia  Left ventricular hypertrophy with repolarization abnormality  Abnormal ECG  No previous ECGs available  Confirmed by [facility], Lacey ([id number]) on 07/03/2019 4:32:09 PM     CBC WITH AUTOMATED DIFF    Collection Time: 07/03/19  4:38 PM   Result Value Ref Range    WBC 9.4 4.1 - 11.1 K/uL    RBC 4.64 4.10 - 5.70 M/uL    HGB 14.4 12.1 - 17.0 g/dL    HCT 41.8 36.6 - 50.3 %    MCV 90.1 80.0 - 99.0 FL    MCH 31.0 26.0 - 34.0 PG    MCHC 34.4 30.0 - 36.5 g/dL    RDW 13.9 11.5 - 14.5 %    PLATELET 266 150 - 400 K/uL    MPV 9.8 8.9 - 12.9 FL    NEUTROPHILS 83 (H) 32 - 75 %    LYMPHOCYTES 9 (L) 12 - 49 %    MONOCYTES 6 5 - 13 %    EOSINOPHILS 2 0 - 7 %    BASOPHILS 0 0 - 1 %    IMMATURE GRANULOCYTES 0 0.0 - 0.5 %    ABS. NEUTROPHILS 7.9 1.8 - 8.0 K/UL    ABS. LYMPHOCYTES 0.8 0.8 - 3.5 K/UL    ABS. MONOCYTES 0.5 0.0 - 1.0 K/UL    ABS.  EOSINOPHILS 0.1 0.0 - 0.4 K/UL    ABS. BASOPHILS 0.0 0.0 - 0.1 K/UL    ABS. IMM. GRANS. 0.0 0.00 - 0.04 K/UL    DF AUTOMATED     METABOLIC PANEL, COMPREHENSIVE    Collection Time: 07/03/19  4:38 PM   Result Value Ref Range    Sodium 133 (L) 136 - 145 mmol/L    Potassium 6.3 (H) 3.5 - 5.1 mmol/L    Chloride 107 97 - 108 mmol/L    CO2 15 (LL) 21 - 32 mmol/L    Anion gap 11 5 - 15 mmol/L    Glucose 113 (H) 65 - 100 mg/dL    BUN 114 (H) 6 - 20 mg/dL    Creatinine 6.84 (H) 0.70 - 1.30 mg/dL    BUN/Creatinine ratio 17 12 - 20      GFR est AA 10 (L) >60 ml/min/1.59m    GFR est non-AA 8 (L) >60 ml/min/1.726m   Calcium 9.1 8.5 - 10.1 mg/dL    Bilirubin, total 0.6 0.2 - 1.0 mg/dL    AST (SGOT) 7 (L) 15 - 37 U/L    ALT (SGPT) 10 (L) 12 - 78 U/L    Alk. phosphatase 90 45 - 117 U/L    Protein, total 10.3 (H) 6.4 - 8.2 g/dL    Albumin 3.7 3.5 - 5.0 g/dL    Globulin 6.6 (H) 2.0 - 4.0 g/dL    A-G Ratio 0.6 (L) 1.1 - 2.2     MAGNESIUM    Collection Time: 07/03/19  4:38 PM   Result Value Ref Range    Magnesium 2.6 (H) 1.6 - 2.4 mg/dL   PHOSPHORUS    Collection Time: 07/03/19  4:38 PM   Result Value Ref Range    Phosphorus 6.6 (H) 2.6 - 4.7 mg/dL   TROPONIN I    Collection Time: 07/03/19  4:38 PM   Result Value Ref Range    Troponin-I, Qt. <0.05 <  0.05 ng/mL   BNP    Collection Time: 07/03/19  4:38 PM   Result Value Ref Range    NT pro-BNP 1,395 (H) <125 pg/mL       XR Results (most recent):  Results from [facility] encounter on 07/03/19   XR CHEST SNGL V    Narrative Chest single view.    Emphysema/bullous cysts. Small volume linear reticular markings left lung base.  No gross interstitial or alveolar pulmonary edema. Left side cardiac device with  leads overlying the heart. No pneumothorax or sizable pleural effusion.          XR CHEST SNGL V   Final Result      [place] RETROPERITONEUM COMP    (Results Pending)        Review of Systems:  Constitutional: Fatigue, generalized weakness  HENT: Negative.    Eyes: Negative.     Respiratory: Shortness of breath on minimal exertion  Cardiovascular: Negative.    Gastrointestinal: Negative for abdominal pain and nausea.   Skin: Negative.    Neurological: Negative.      Objective:   VITALS:    Visit Vitals  BP (!) 130/100   Pulse 69   Temp 97.3 ??F (36.3 ??C)   Resp 18   Ht 5' 8"  (1.727 m)   Wt 54.4 kg (120 lb)   SpO2 100%   BMI 18.25 kg/m??       Physical Exam:   Constitutional: pt is oriented to person, place, and time.  Somewhat confused to situation.  Unable to care for himself.  Requires maximum assistance for moving in the bed due to his generalized weakness  HENT:   Head: Normocephalic and atraumatic.   Eyes: Pupils are equal, round, and reactive to light. EOM are normal.   Cardiovascular: Tachycardia while being turned and changed in the bed, normal rate otherwise, regular rhythm and normal heart sounds.  No peripheral edema.  Left-sided pacemaker  Pulmonary/Chest: Breath sounds normal. No wheezes. No rales.   Exhibits no tenderness.   Abdominal: Soft. Bowel sounds are normal. There is no abdominal tenderness. There is no rebound and no guarding.   Musculoskeletal: Normal range of motion.   Skin: Vitiligo on all extremities  Neurological: pt is alert and oriented to person, place, and time. Alert. Normal strength. No cranial nerve deficit or sensory deficit.        ASSESSMENT & PLAN:  AKI versus undiagnosed CKD  Urinary retention  Electrolyte disturbances -hyponatremia, hyperkalemia, hyperphosphatemia, hypermagnesemia  Decreased mobility  History of cardiac disease and pacemaker  History of high blood pressure  History of prostate cancer  Abnormal chest x-ray suggestive of emphysema -patient does not currently smoke  Cachexia    Admit patient to telemetry  Nephrology consult  Repeat BMP after IV fluid infusion???order placed  Repeat labs in the morning  Monitor intake and output  Await cultures and urinalysis  Repeat EKG as necessary  PT OT consult  Nutrition consult  Case management  consult for discharge planning and potential for placement in rehab or skilled nursing facility    Patient is full code    DVT prophylaxis with heparin subcu    GI prophylaxis with Pepcid    Discussed with Dr. Clinton Sawyer      ________________________________________________________________________    Signed: Demetrios Loll, NP

## 2019-07-04 ENCOUNTER — Inpatient Hospital Stay: Admit: 2019-07-04 | Payer: MEDICARE | Primary: Internal Medicine

## 2019-07-04 LAB — METABOLIC PANEL, COMPREHENSIVE
A-G Ratio: 0.5 — ABNORMAL LOW (ref 1.1–2.2)
ALT (SGPT): 9 U/L — ABNORMAL LOW (ref 12–78)
AST (SGOT): 5 U/L — ABNORMAL LOW (ref 15–37)
Albumin: 2.9 g/dL — ABNORMAL LOW (ref 3.5–5.0)
Alk. phosphatase: 72 U/L (ref 45–117)
Anion gap: 9 mmol/L (ref 5–15)
BUN/Creatinine ratio: 20 (ref 12–20)
BUN: 103 mg/dL — ABNORMAL HIGH (ref 6–20)
Bilirubin, total: 0.5 mg/dL (ref 0.2–1.0)
CO2: 20 mmol/L — ABNORMAL LOW (ref 21–32)
Calcium: 8.1 mg/dL — ABNORMAL LOW (ref 8.5–10.1)
Chloride: 105 mmol/L (ref 97–108)
Creatinine: 5.28 mg/dL — ABNORMAL HIGH (ref 0.70–1.30)
GFR est AA: 13 mL/min/{1.73_m2} — ABNORMAL LOW (ref >60)
GFR est non-AA: 11 mL/min/{1.73_m2} — ABNORMAL LOW (ref >60)
Globulin: 5.4 g/dL — ABNORMAL HIGH (ref 2.0–4.0)
Glucose: 90 mg/dL (ref 65–100)
Potassium: 4.6 mmol/L (ref 3.5–5.1)
Protein, total: 8.3 g/dL — ABNORMAL HIGH (ref 6.4–8.2)
Sodium: 134 mmol/L — ABNORMAL LOW (ref 136–145)

## 2019-07-04 LAB — URINALYSIS W/ REFLEX CULTURE
Bacteria: NEGATIVE /hpf
Bilirubin: NEGATIVE
Glucose: 50 mg/dL — AB
Ketone: NEGATIVE mg/dL
Nitrites: NEGATIVE
Protein: 100 mg/dL — AB
RBC: >100 /hpf — ABNORMAL HIGH (ref 0–5)
Specific gravity: 1.008 (ref 1.003–1.030)
Urobilinogen: 0.1 EU/dL (ref 0.1–1.0)
WBC: >100 /hpf — ABNORMAL HIGH (ref 0–4)
pH (UA): 6 (ref 5.0–8.0)

## 2019-07-04 LAB — CBC WITH AUTOMATED DIFF
ABS. BASOPHILS: 0 10*3/uL (ref 0.0–0.1)
ABS. EOSINOPHILS: 0.4 10*3/uL (ref 0.0–0.4)
ABS. IMM. GRANS.: 0 10*3/uL (ref 0.00–0.04)
ABS. LYMPHOCYTES: 1 10*3/uL (ref 0.8–3.5)
ABS. MONOCYTES: 0.6 10*3/uL (ref 0.0–1.0)
ABS. NEUTROPHILS: 5.9 10*3/uL (ref 1.8–8.0)
BASOPHILS: 0 % (ref 0–1)
EOSINOPHILS: 5 % (ref 0–7)
HCT: 30.2 % — ABNORMAL LOW (ref 36.6–50.3)
HGB: 10.2 g/dL — ABNORMAL LOW (ref 12.1–17.0)
IMMATURE GRANULOCYTES: 0 % (ref 0.0–0.5)
LYMPHOCYTES: 13 % (ref 12–49)
MCH: 30.2 PG (ref 26.0–34.0)
MCHC: 33.8 g/dL (ref 30.0–36.5)
MCV: 89.3 FL (ref 80.0–99.0)
MONOCYTES: 7 % (ref 5–13)
MPV: 9.9 FL (ref 8.9–12.9)
NEUTROPHILS: 75 % (ref 32–75)
PLATELET: 243 10*3/uL (ref 150–400)
RBC: 3.38 M/uL — ABNORMAL LOW (ref 4.10–5.70)
RDW: 13.6 % (ref 11.5–14.5)
WBC: 7.9 10*3/uL (ref 4.1–11.1)

## 2019-07-04 LAB — METABOLIC PANEL, BASIC
Anion gap: 10 mmol/L (ref 5–15)
BUN/Creatinine ratio: 18 (ref 12–20)
BUN: 109 mg/dL — ABNORMAL HIGH (ref 6–20)
CO2: 19 mmol/L — ABNORMAL LOW (ref 21–32)
Calcium: 9 mg/dL (ref 8.5–10.1)
Chloride: 104 mmol/L (ref 97–108)
Creatinine: 5.94 mg/dL — ABNORMAL HIGH (ref 0.70–1.30)
GFR est AA: 12 mL/min/{1.73_m2} — ABNORMAL LOW (ref >60)
GFR est non-AA: 10 mL/min/{1.73_m2} — ABNORMAL LOW (ref >60)
Glucose: 115 mg/dL — ABNORMAL HIGH (ref 65–100)
Potassium: 5 mmol/L (ref 3.5–5.1)
Sodium: 133 mmol/L — ABNORMAL LOW (ref 136–145)

## 2019-07-04 LAB — GLUCOSE, POC
Glucose (POC): 155 mg/dL — ABNORMAL HIGH (ref 65–100)
Glucose (POC): 112 mg/dL — ABNORMAL HIGH (ref 65–100)

## 2019-07-04 LAB — CREATININE, UR, RANDOM: Creatinine, urine random: 29 mg/dL

## 2019-07-04 LAB — MAGNESIUM: Magnesium: 2 mg/dL (ref 1.6–2.4)

## 2019-07-04 LAB — COVID-19 RAPID TEST: COVID-19 rapid test: NOT DETECTED

## 2019-07-04 LAB — PROTEIN URINE, RANDOM: Protein, urine random: 48 mg/dL — ABNORMAL HIGH (ref 0.0–11.9)

## 2019-07-04 LAB — SODIUM, UR, RANDOM: Sodium,urine random: 90 mmol/L

## 2019-07-04 LAB — HEMOGLOBIN A1C WITH EAG
Est. average glucose: 134 mg/dL
Hemoglobin A1c: 6.3 % — ABNORMAL HIGH (ref 4.0–5.6)

## 2019-07-04 LAB — POTASSIUM, UR, RANDOM: Potassium urine, random: 21 mmol/L

## 2019-07-04 LAB — SARS-COV-2

## 2019-07-04 MED ORDER — NOREPINEPHRINE 8 MG/250 ML (32 MCG/ML) D5W INFUSION
8 mg/250 mL (32 mcg/mL) | INTRAVENOUS | Status: DC
Start: 2019-07-04 — End: 2019-07-08
  Administered 2019-07-04 – 2019-07-05 (×6): via INTRAVENOUS

## 2019-07-04 MED ORDER — SODIUM CHLORIDE 0.9 % IV
INTRAVENOUS | Status: DC
Start: 2019-07-04 — End: 2019-07-06
  Administered 2019-07-04 – 2019-07-06 (×5): via INTRAVENOUS

## 2019-07-04 MED ORDER — ACETAMINOPHEN 650 MG RECTAL SUPPOSITORY
650 mg | Freq: Four times a day (QID) | RECTAL | Status: DC | PRN
Start: 2019-07-04 — End: 2019-07-12

## 2019-07-04 MED ORDER — INSULIN LISPRO 100 UNIT/ML INJECTION
100 unit/mL | Freq: Two times a day (BID) | SUBCUTANEOUS | Status: DC
Start: 2019-07-04 — End: 2019-07-12
  Administered 2019-07-09 – 2019-07-12 (×2): via SUBCUTANEOUS

## 2019-07-04 MED ORDER — SODIUM BICARBONATE 650 MG TAB
650 mg | Freq: Two times a day (BID) | ORAL | Status: DC
Start: 2019-07-04 — End: 2019-07-04

## 2019-07-04 MED ORDER — SODIUM CHLORIDE 0.9% BOLUS IV
0.9 % | Freq: Once | INTRAVENOUS | Status: AC
Start: 2019-07-04 — End: 2019-07-04
  Administered 2019-07-04: 16:00:00 via INTRAVENOUS

## 2019-07-04 MED ORDER — CEFTRIAXONE 1 GRAM SOLUTION FOR INJECTION
1 gram | INTRAMUSCULAR | Status: DC
Start: 2019-07-04 — End: 2019-07-04
  Administered 2019-07-04: 01:00:00 via INTRAVENOUS

## 2019-07-04 MED ORDER — TAMSULOSIN SR 0.4 MG 24 HR CAP
0.4 mg | Freq: Every evening | ORAL | Status: DC
Start: 2019-07-04 — End: 2019-07-12
  Administered 2019-07-05 – 2019-07-12 (×8): via ORAL

## 2019-07-04 MED ORDER — SODIUM CHLORIDE 0.9 % IJ SYRG
INTRAMUSCULAR | Status: DC | PRN
Start: 2019-07-04 — End: 2019-07-12

## 2019-07-04 MED ORDER — SODIUM CHLORIDE 0.9 % IJ SYRG
Freq: Three times a day (TID) | INTRAMUSCULAR | Status: DC
Start: 2019-07-04 — End: 2019-07-12
  Administered 2019-07-04 – 2019-07-12 (×23): via INTRAVENOUS

## 2019-07-04 MED ORDER — POLYETHYLENE GLYCOL 3350 17 GRAM (100 %) ORAL POWDER PACKET
17 gram | Freq: Every day | ORAL | Status: DC | PRN
Start: 2019-07-04 — End: 2019-07-12

## 2019-07-04 MED ORDER — SODIUM BICARBONATE 650 MG TAB
650 mg | Freq: Two times a day (BID) | ORAL | Status: DC
Start: 2019-07-04 — End: 2019-07-12
  Administered 2019-07-04 – 2019-07-12 (×17): via ORAL

## 2019-07-04 MED ORDER — LACTATED RINGERS BOLUS IV
Freq: Once | INTRAVENOUS | Status: AC
Start: 2019-07-04 — End: 2019-07-03
  Administered 2019-07-04: 03:00:00 via INTRAVENOUS

## 2019-07-04 MED ORDER — GLUCAGON 1 MG INJECTION
1 mg | INTRAMUSCULAR | Status: DC | PRN
Start: 2019-07-04 — End: 2019-07-12

## 2019-07-04 MED ORDER — HEPARIN (PORCINE) 5,000 UNIT/ML IJ SOLN
5000 unit/mL | Freq: Three times a day (TID) | INTRAMUSCULAR | Status: DC
Start: 2019-07-04 — End: 2019-07-12
  Administered 2019-07-04 – 2019-07-12 (×24): via SUBCUTANEOUS

## 2019-07-04 MED ORDER — SODIUM CHLORIDE 0.9% BOLUS IV
0.9 % | Freq: Once | INTRAVENOUS | Status: AC
Start: 2019-07-04 — End: 2019-07-04
  Administered 2019-07-04: 13:00:00 via INTRAVENOUS

## 2019-07-04 MED ORDER — ONDANSETRON (PF) 4 MG/2 ML INJECTION
4 mg/2 mL | Freq: Four times a day (QID) | INTRAMUSCULAR | Status: DC | PRN
Start: 2019-07-04 — End: 2019-07-12

## 2019-07-04 MED ORDER — DEXTROSE 50% IN WATER (D50W) IV SYRG
INTRAVENOUS | Status: DC | PRN
Start: 2019-07-04 — End: 2019-07-12

## 2019-07-04 MED ORDER — PROMETHAZINE 25 MG TAB
25 mg | Freq: Four times a day (QID) | ORAL | Status: DC | PRN
Start: 2019-07-04 — End: 2019-07-12

## 2019-07-04 MED ORDER — GLUCOSE 4 GRAM CHEWABLE TAB
4 gram | ORAL | Status: DC | PRN
Start: 2019-07-04 — End: 2019-07-12

## 2019-07-04 MED ORDER — ACETAMINOPHEN 325 MG TABLET
325 mg | Freq: Four times a day (QID) | ORAL | Status: DC | PRN
Start: 2019-07-04 — End: 2019-07-12

## 2019-07-04 MED FILL — SODIUM CHLORIDE 0.9 % IV: INTRAVENOUS | Qty: 1000

## 2019-07-04 MED FILL — HEPARIN (PORCINE) 5,000 UNIT/ML IJ SOLN: 5000 unit/mL | INTRAMUSCULAR | Qty: 1

## 2019-07-04 MED FILL — SODIUM BICARBONATE 650 MG TAB: 650 mg | ORAL | Qty: 1

## 2019-07-04 MED FILL — SODIUM CHLORIDE 0.9 % IV: INTRAVENOUS | Qty: 500

## 2019-07-04 MED FILL — SODIUM CHLORIDE 0.9 % IJ SYRG: INTRAMUSCULAR | Qty: 40

## 2019-07-04 MED FILL — CEFTRIAXONE 1 GRAM SOLUTION FOR INJECTION: 1 gram | INTRAMUSCULAR | Qty: 1

## 2019-07-04 MED FILL — NOREPINEPHRINE 8 MG/250 ML (32 MCG/ML) D5W INFUSION: 8 mg/250 mL (32 mcg/mL) | INTRAVENOUS | Qty: 250

## 2019-07-04 MED FILL — LACTATED RINGERS IV: INTRAVENOUS | Qty: 1000

## 2019-07-04 MED FILL — WATER FOR INJECTION, STERILE INJECTION: INTRAMUSCULAR | Qty: 10

## 2019-07-04 NOTE — Progress Notes (Signed)
Four eyes skin assessment done on admission with Leonette Monarch, RN. There two small open areas to the sacrum. Z-paste applied. Skin is hypopigmented. Patient is cachectic with high risk for skin breakdown. Preventative measures including frequent turning, floating heels and specialty bed implemented. Will continue to monitor.

## 2019-07-04 NOTE — Progress Notes (Signed)
Bedside shift report completed with Terrilee Croak, RN.

## 2019-07-04 NOTE — Consults (Addendum)
[facility] Renal Consult Note      NAME:  Wesley Clark   DOB:   [birth year]   MRN:  [id number]     Requesting Physician Mohiuddin, Glade Lloyd, MD   Reason for Consult:  Acute kidney injury     PCP:  None     Date/Time:  07/04/2019 11:24 AM          Subjective:     CHIEF COMPLAINT: Weakness and confusion    HISTORY OF PRESENT ILLNESS:     Mr. Wesley Clark is a 64 y.o.  African American male who is admitted to the medical service with shortness of breath, weakness.  We are asked to evaluate for acute kidney injury.      Patient brought to the emergency room because of weakness and shortness of breath.  Found to have an elevated serum creatinine of 6.84 associated with a distended bladder and bilateral hydronephrosis.  Probably also had a urinary tract infection.  Foley catheter was inserted with a marked urine output.  Serum creatinine is start to decline.  Patient is pleasant and conversant but a very poor historian.  Does not have any previous history of kidney disease.  There is a history of prostate cancer but he cannot recall the treatments.  He does know he has a pacemaker but does not know any details of his heart disease other than the pacemaker.    He  is no longer short of breath.  There is no chest pain.  He is starting to eat without nausea, vomiting or abdominal discomfort.  He does note that he has had difficulty in initiating a urinary stream, urinary stream has been weak and there has been terminal dribbling.  He denies lower extremity edema.  There has been a significant weight loss which he cannot quantify.  No seizures, no chills or fevers, no syncopal episodes  Past Medical History:   Diagnosis Date   ??? Prostate cancer [facility])         History reviewed. No pertinent surgical history.    Social History     Tobacco Use   ??? Smoking status: Not on file   Substance Use Topics   ??? Alcohol use: Not on file        History reviewed. No pertinent family history.     Allergies   Allergen Reactions   ???  Penicillins Rash        Prior to Admission medications    Not on File         Current Facility-Administered Medications:   ???  0.9% sodium chloride infusion, 100 mL/hr, IntraVENous, CONTINUOUS, Mohiuddin, Abdul, MD, Last Rate: 100 mL/hr at 07/04/19 0949, 100 mL/hr at 07/04/19 0949  ???  insulin lispro (HUMALOG) injection, , SubCUTAneous, ACB&D, Willeen Cass, NP, Stopped at 07/04/19 0730  ???  glucose chewable tablet 16 g, 4 Tab, Oral, PRN, Willeen Cass, NP  ???  dextrose (D50W) injection syrg 12.5-25 g, 25-50 mL, IntraVENous, PRN, Willeen Cass, NP  ???  glucagon (GLUCAGEN) injection 1 mg, 1 mg, IntraMUSCular, PRN, Willeen Cass, NP  ???  sodium chloride (NS) flush 5-40 mL, 5-40 mL, IntraVENous, Q8H, Willeen Cass, NP, 10 mL at 07/04/19 0545  ???  sodium chloride (NS) flush 5-40 mL, 5-40 mL, IntraVENous, PRN, Willeen Cass, NP  ???  acetaminophen (TYLENOL) tablet 650 mg, 650 mg, Oral, Q6H PRN **OR** acetaminophen (TYLENOL) suppository 650 mg, 650 mg, Rectal, Q6H PRN, Willeen Cass, NP  ???  polyethylene glycol (MIRALAX) packet 17  g, 17 g, Oral, DAILY PRN, Willeen Cass, NP  ???  promethazine (PHENERGAN) tablet 12.5 mg, 12.5 mg, Oral, Q6H PRN **OR** ondansetron (ZOFRAN) injection 4 mg, 4 mg, IntraVENous, Q6H PRN, Willeen Cass, NP  ???  heparin (porcine) injection 5,000 Units, 5,000 Units, SubCUTAneous, Q8H, Willeen Cass, NP, 5,000 Units at 07/04/19 7341  ???  cefTRIAXone (ROCEPHIN) 1 g in sterile water (preservative free) 10 mL IV syringe, 1 g, IntraVENous, Q24H, Willeen Cass, NP, 1 g at 07/03/19 2122  No current outpatient medications on file.      Review of Systems:  A comprehensive review of systems was negative except for that written in the HPI.       Objective:      VITALS:    Vital signs reviewed; most recent are:    Visit Vitals  BP 99/70 (BP 1 Location: Left upper arm, BP Patient Position: At rest)   Pulse 78   Temp 97.5 ??F (36.4 ??C)   Resp 14   Ht 5\' 8"  (1.727 m)   Wt 54.4 kg (120 lb)   SpO2  100%   BMI 18.25 kg/m??     SpO2 Readings from Last 6 Encounters:   07/04/19 100%            Intake/Output Summary (Last 24 hours) at 07/04/2019 1124  Last data filed at 07/04/2019 1119  Gross per 24 hour   Intake 1600 ml   Output 2100 ml   Net -500 ml            Exam:   Physical Exam:  General appearance: alert, cooperative, no distress, appears older than stated age, cachectic  Head: Normocephalic, without obvious abnormality, atraumatic, temporal wasting  Eyes: negative findings: anicteric sclera and conjunctivae and sclerae normal  Neck: supple, symmetrical, trachea midline, no adenopathy, thyroid: not enlarged, symmetric, no tenderness/mass/nodules,  no JVD  Lungs: clear to auscultation bilaterally  Chest wall: Left upper chest pacemaker readily apparent due to lack of adipose tissue  Heart: regular rate and rhythm, no S3 or S4, no rub  Abdomen: soft, non-tender. Bowel sounds normal. No masses,  no organomegaly  Extremities: no edema  Skin: hypopigmentation - hand(s) bilateral with vitiligo  Neurologic: Grossly normal, alert and oriented, conversant, no focal motor defects     LABS:  Recent Labs     07/04/19  0345 07/04/19  0005 07/03/19  1638   NA 134* 133* 133*   K 4.6 5.0 6.3*   CL 105 104 107   CO2 20* 19* 15*   BUN 103* 109* 114*   CREA 5.28* 5.94* 6.84*   CA 8.1* 9.0 9.1   ALB 2.9*  --  3.7   PHOS  --   --  6.6*   MG 2.0  --  2.6*     Recent Labs     07/04/19  0345 07/03/19  1638   WBC 7.9 9.4   HGB 10.2* 14.4   HCT 30.2* 41.8   PLT 243 266     Lab Results   Component Value Date/Time    Glucose (POC) 155 (H) 07/04/2019 08:19 AM     No results for input(s): PH, PCO2, PO2, HCO3, FIO2 in the last 72 hours.  No results for input(s): INR, INREXT in the last 72 hours.  Recent Labs     07/03/19  2015   NAU 90   KU 21     Retroperitoneal ultrasound Jul 03, 2019:  History: Renal failure  ??  Retroperitoneal  ultrasound was performed. Aorta and IVC appear within normal  limits. Right kidney measures 9.4 x 4.8 x 5.5 cm.  There is hydronephrosis. Left  kidney measures 9.3 x 5.1 x 5.0 cm. There is hydronephrosis. The bladder is  distended with a sludge fluid level within it. It measures 8.7 x 5.2 x 5.3 cm.  Foley catheter was placed within the bladder during the examination. There is  some decompression of the bladder. Reevaluation of the kidneys demonstrated  hydronephrosis. It is not indicated how long before reevaluation post Foley.  ??  IMPRESSION  Bilateral hydronephrosis likely due to reflux from distended  bladder.     CXR 07-03-19:  Chest single view.  ??  Emphysema/bullous cysts. Small volume linear reticular markings left lung base.  No gross interstitial or alveolar pulmonary edema. Left side cardiac device with  leads overlying the heart. No pneumothorax or sizable pleural effusion.    Assessment:   Renal Specific Problems  Probable mild chronic kidney disease at baseline  Acute kidney injury related to obstructive uropathy  Bladder outlet obstruction with secondary hydronephrosis  Hyperkalemia related to AKI  Anemia probably related to renal insufficiency, rule out iron deficiency  Metabolic acidosis  Hyperphosphatemia  Hypoalbuminemia  Unexplained weight loss  History of prostate cancer???treatment protocol not clear  Hypotension        Plan:     Obtain/ Order: labs/cultures/radiology/procedures:  renal panel in a.m.  Iron sat, intact pth, vit d2  PSA    Therapeutic:    Continue IV fluids  Oral sodium bicarbonate  Watch for resolution of hyperphosphatemia, if does not occur then will give binders  Flomax  Repeat renal ultrasound in 2 to 3 days for resolution of hydronephrosis    Risk of deterioration: low      Total time spent with patient: 30 Minutes                   ___________________________________________________    Attending Physician: Gerhard Perches, MD

## 2019-07-04 NOTE — ED Notes (Signed)
TRANSFER - OUT REPORT:    Verbal report given to Josie RN(name) on Daxen Lanum  being transferred to ICU (unit) for routine progression of care       Report consisted of patient???s Situation, Background, Assessment and   Recommendations(SBAR).     Information from the following report(s) SBAR, Kardex, ED Summary, MAR, Recent Results and Cardiac Rhythm NSR was reviewed with the receiving nurse.    Lines:   Peripheral IV 07/03/19 Anterior;Distal;Left Forearm (Active)   Site Assessment Clean, dry, & intact 07/03/19 1748   Dressing Status Clean, dry, & intact 07/03/19 1748   Dressing Type Transparent 07/03/19 1748       Peripheral IV 07/04/19 Right Forearm (Active)   Site Assessment Clean, dry, & intact 07/04/19 0012   Phlebitis Assessment 0 07/04/19 0012   Infiltration Assessment 0 07/04/19 0012   Dressing Status Clean, dry, & intact 07/04/19 0012       Peripheral IV 07/04/19 Right;Upper Arm (Active)   Site Assessment Clean, dry, & intact 07/04/19 0350   Phlebitis Assessment 0 07/04/19 0350   Infiltration Assessment 0 07/04/19 0350   Dressing Status Clean, dry, & intact 07/04/19 0350        Opportunity for questions and clarification was provided.      Patient transported with:   Monitor  Registered Nurse   Levophed drip    Next heparin SQ due at 1630 and accucheck due at 1630.  Oncoming RN notified.

## 2019-07-04 NOTE — ED Notes (Signed)
PICC team states unable to place midline due to patient having pacemaker and kidney failure.      Dr. Gabriel Earing aware and order for interventional radiology.    Interventional Radiology notified of need for central line for potential vasopressors.

## 2019-07-04 NOTE — Progress Notes (Signed)
Spoke with Rericha RN stating that picc line was not an optimal access for a 64 year old in kidney failure and a pacemaker on one side. She stated IR was consulted.

## 2019-07-04 NOTE — H&P (Signed)
General Daily Progress Note          Patient Name:   Wesley Clark       Date of Birth:   [birth year]       Age:  64 y.o.      Admit Date: 07/03/2019      Subjective:       Patient resting in bed hypotensive is of normal saline multiple times but no much improvement ordered start Levophed and transferred to ICU           Objective:     Visit Vitals  BP (!) 88/68   Pulse 72   Temp 97.9 ??F (36.6 ??C)   Resp 16   Ht 5' 8"  (1.727 m)   Wt 54.4 kg (120 lb)   SpO2 100%   BMI 18.25 kg/m??        Recent Results (from the past 24 hour(s))   URINALYSIS W/ REFLEX CULTURE    Collection Time: 07/03/19  4:15 PM    Specimen: Urine   Result Value Ref Range    Color Yellow/Straw      Appearance Turbid (A) Clear      Specific gravity 1.008 1.003 - 1.030      pH (UA) 6.0 5.0 - 8.0      Protein 100 (A) Negative mg/dL    Glucose 50 (A) Negative mg/dL    Ketone Negative Negative mg/dL    Bilirubin Negative Negative      Blood Large (A) Negative      Urobilinogen 0.1 0.1 - 1.0 EU/dL    Nitrites Negative Negative      Leukocyte Esterase Moderate (A) Negative      UA:UC IF INDICATED Urine Culture Ordered (A) Culture not indicated by UA result      WBC >100 (H) 0 - 4 /hpf    RBC >100 (H) 0 - 5 /hpf    Bacteria Negative Negative /hpf    Other Urine Micro Present     CBC WITH AUTOMATED DIFF    Collection Time: 07/03/19  4:38 PM   Result Value Ref Range    WBC 9.4 4.1 - 11.1 K/uL    RBC 4.64 4.10 - 5.70 M/uL    HGB 14.4 12.1 - 17.0 g/dL    HCT 41.8 36.6 - 50.3 %    MCV 90.1 80.0 - 99.0 FL    MCH 31.0 26.0 - 34.0 PG    MCHC 34.4 30.0 - 36.5 g/dL    RDW 13.9 11.5 - 14.5 %    PLATELET 266 150 - 400 K/uL    MPV 9.8 8.9 - 12.9 FL    NEUTROPHILS 83 (H) 32 - 75 %    LYMPHOCYTES 9 (L) 12 - 49 %    MONOCYTES 6 5 - 13 %    EOSINOPHILS 2 0 - 7 %    BASOPHILS 0 0 - 1 %    IMMATURE GRANULOCYTES 0 0.0 - 0.5 %    ABS. NEUTROPHILS 7.9 1.8 - 8.0 K/UL    ABS. LYMPHOCYTES 0.8 0.8 - 3.5 K/UL    ABS. MONOCYTES 0.5 0.0 - 1.0 K/UL    ABS. EOSINOPHILS 0.1 0.0 - 0.4 K/UL    ABS.  BASOPHILS 0.0 0.0 - 0.1 K/UL    ABS. IMM. GRANS. 0.0 0.00 - 0.04 K/UL    DF AUTOMATED     METABOLIC PANEL, COMPREHENSIVE    Collection Time: 07/03/19  4:38 PM   Result Value Ref Range  Sodium 133 (L) 136 - 145 mmol/L    Potassium 6.3 (H) 3.5 - 5.1 mmol/L    Chloride 107 97 - 108 mmol/L    CO2 15 (LL) 21 - 32 mmol/L    Anion gap 11 5 - 15 mmol/L    Glucose 113 (H) 65 - 100 mg/dL    BUN 114 (H) 6 - 20 mg/dL    Creatinine 6.84 (H) 0.70 - 1.30 mg/dL    BUN/Creatinine ratio 17 12 - 20      GFR est AA 10 (L) >60 ml/min/1.57m    GFR est non-AA 8 (L) >60 ml/min/1.757m   Calcium 9.1 8.5 - 10.1 mg/dL    Bilirubin, total 0.6 0.2 - 1.0 mg/dL    AST (SGOT) 7 (L) 15 - 37 U/L    ALT (SGPT) 10 (L) 12 - 78 U/L    Alk. phosphatase 90 45 - 117 U/L    Protein, total 10.3 (H) 6.4 - 8.2 g/dL    Albumin 3.7 3.5 - 5.0 g/dL    Globulin 6.6 (H) 2.0 - 4.0 g/dL    A-G Ratio 0.6 (L) 1.1 - 2.2     MAGNESIUM    Collection Time: 07/03/19  4:38 PM   Result Value Ref Range    Magnesium 2.6 (H) 1.6 - 2.4 mg/dL   PHOSPHORUS    Collection Time: 07/03/19  4:38 PM   Result Value Ref Range    Phosphorus 6.6 (H) 2.6 - 4.7 mg/dL   TROPONIN I    Collection Time: 07/03/19  4:38 PM   Result Value Ref Range    Troponin-I, Qt. <0.05 <0.05 ng/mL   BNP    Collection Time: 07/03/19  4:38 PM   Result Value Ref Range    NT pro-BNP 1,395 (H) <125 pg/mL   POTASSIUM, UR, RANDOM    Collection Time: 07/03/19  8:15 PM   Result Value Ref Range    Potassium urine, random 21 mmol/L   SODIUM, UR, RANDOM    Collection Time: 07/03/19  8:15 PM   Result Value Ref Range    Sodium,urine random 90 mmol/L   METABOLIC PANEL, BASIC    Collection Time: 07/04/19 12:05 AM   Result Value Ref Range    Sodium 133 (L) 136 - 145 mmol/L    Potassium 5.0 3.5 - 5.1 mmol/L    Chloride 104 97 - 108 mmol/L    CO2 19 (L) 21 - 32 mmol/L    Anion gap 10 5 - 15 mmol/L    Glucose 115 (H) 65 - 100 mg/dL    BUN 109 (H) 6 - 20 mg/dL    Creatinine 5.94 (H) 0.70 - 1.30 mg/dL    BUN/Creatinine ratio 18 12 -  20      GFR est AA 12 (L) >60 ml/min/1.7347m  GFR est non-AA 10 (L) >60 ml/min/1.40m17m Calcium 9.0 8.5 - 10.104.5dL   METABOLIC PANEL, COMPREHENSIVE    Collection Time: 07/04/19  3:45 AM   Result Value Ref Range    Sodium 134 (L) 136 - 145 mmol/L    Potassium 4.6 3.5 - 5.1 mmol/L    Chloride 105 97 - 108 mmol/L    CO2 20 (L) 21 - 32 mmol/L    Anion gap 9 5 - 15 mmol/L    Glucose 90 65 - 100 mg/dL    BUN 103 (H) 6 - 20 mg/dL    Creatinine 5.28 (H) 0.70 - 1.30 mg/dL  BUN/Creatinine ratio 20 12 - 20      GFR est AA 13 (L) >60 ml/min/1.31m    GFR est non-AA 11 (L) >60 ml/min/1.764m   Calcium 8.1 (L) 8.5 - 10.1 mg/dL    Bilirubin, total 0.5 0.2 - 1.0 mg/dL    AST (SGOT) 5 (L) 15 - 37 U/L    ALT (SGPT) 9 (L) 12 - 78 U/L    Alk. phosphatase 72 45 - 117 U/L    Protein, total 8.3 (H) 6.4 - 8.2 g/dL    Albumin 2.9 (L) 3.5 - 5.0 g/dL    Globulin 5.4 (H) 2.0 - 4.0 g/dL    A-G Ratio 0.5 (L) 1.1 - 2.2     MAGNESIUM    Collection Time: 07/04/19  3:45 AM   Result Value Ref Range    Magnesium 2.0 1.6 - 2.4 mg/dL   CBC WITH AUTOMATED DIFF    Collection Time: 07/04/19  3:45 AM   Result Value Ref Range    WBC 7.9 4.1 - 11.1 K/uL    RBC 3.38 (L) 4.10 - 5.70 M/uL    HGB 10.2 (L) 12.1 - 17.0 g/dL    HCT 30.2 (L) 36.6 - 50.3 %    MCV 89.3 80.0 - 99.0 FL    MCH 30.2 26.0 - 34.0 PG    MCHC 33.8 30.0 - 36.5 g/dL    RDW 13.6 11.5 - 14.5 %    PLATELET 243 150 - 400 K/uL    MPV 9.9 8.9 - 12.9 FL    NEUTROPHILS 75 32 - 75 %    LYMPHOCYTES 13 12 - 49 %    MONOCYTES 7 5 - 13 %    EOSINOPHILS 5 0 - 7 %    BASOPHILS 0 0 - 1 %    IMMATURE GRANULOCYTES 0 0.0 - 0.5 %    ABS. NEUTROPHILS 5.9 1.8 - 8.0 K/UL    ABS. LYMPHOCYTES 1.0 0.8 - 3.5 K/UL    ABS. MONOCYTES 0.6 0.0 - 1.0 K/UL    ABS. EOSINOPHILS 0.4 0.0 - 0.4 K/UL    ABS. BASOPHILS 0.0 0.0 - 0.1 K/UL    ABS. IMM. GRANS. 0.0 0.00 - 0.04 K/UL    DF AUTOMATED     SARS-COV-2    Collection Time: 07/04/19  6:30 AM   Result Value Ref Range    SARS-CoV-2 Please find results under separate order      COVID-19 RAPID TEST    Collection Time: 07/04/19  6:30 AM   Result Value Ref Range    Specimen source Nasopharyngeal      COVID-19 rapid test Not Detected Not Detected     GLUCOSE, POC    Collection Time: 07/04/19  8:19 AM   Result Value Ref Range    Glucose (POC) 155 (H) 65 - 100 mg/dL    Performed by [employer]    Collection Time: 07/04/19 12:50 PM   Result Value Ref Range    Protein, urine random 48 (H) 0.0 - 11.9 mg/dL   CREATININE, UR, RANDOM    Collection Time: 07/04/19 12:50 PM   Result Value Ref Range    Creatinine, urine 29.00 mg/dL   GLUCOSE, POC    Collection Time: 07/04/19  4:02 PM   Result Value Ref Range    Glucose (POC) 112 (H) 65 - 100 mg/dL    Performed by Stump Josie      @[email or web address] @  Review of Systems    Constitutional: Negative for chills and fever.   HENT: Negative.    Eyes: Negative.    Respiratory: Negative.    Cardiovascular: Negative.    Gastrointestinal: Negative for abdominal pain and nausea.   Skin: Negative.    Neurological: Negative.        Physical Exam:      Constitutional: pt is oriented to person, place, and time.   HENT:   Head: Normocephalic and atraumatic.   Eyes: Pupils are equal, round, and reactive to light. EOM are normal.   Cardiovascular: Normal rate, regular rhythm and normal heart sounds.   Pulmonary/Chest: Breath sounds normal. No wheezes. No rales.   Exhibits no tenderness.   Abdominal: Soft. Bowel sounds are normal. There is no abdominal tenderness. There is no rebound and no guarding.   Musculoskeletal: Normal range of motion.   Neurological: pt is alert and oriented to person, place, and time.     [place] RETROPERITONEUM COMP   Final Result   Bilateral hydronephrosis likely due to reflux from distended   bladder.       XR CHEST SNGL V   Final Result           Recent Results (from the past 24 hour(s))   URINALYSIS W/ REFLEX CULTURE    Collection Time: 07/03/19  4:15 PM    Specimen: Urine   Result Value Ref Range    Color Yellow/Straw       Appearance Turbid (A) Clear      Specific gravity 1.008 1.003 - 1.030      pH (UA) 6.0 5.0 - 8.0      Protein 100 (A) Negative mg/dL    Glucose 50 (A) Negative mg/dL    Ketone Negative Negative mg/dL    Bilirubin Negative Negative      Blood Large (A) Negative      Urobilinogen 0.1 0.1 - 1.0 EU/dL    Nitrites Negative Negative      Leukocyte Esterase Moderate (A) Negative      UA:UC IF INDICATED Urine Culture Ordered (A) Culture not indicated by UA result      WBC >100 (H) 0 - 4 /hpf    RBC >100 (H) 0 - 5 /hpf    Bacteria Negative Negative /hpf    Other Urine Micro Present     CBC WITH AUTOMATED DIFF    Collection Time: 07/03/19  4:38 PM   Result Value Ref Range    WBC 9.4 4.1 - 11.1 K/uL    RBC 4.64 4.10 - 5.70 M/uL    HGB 14.4 12.1 - 17.0 g/dL    HCT 41.8 36.6 - 50.3 %    MCV 90.1 80.0 - 99.0 FL    MCH 31.0 26.0 - 34.0 PG    MCHC 34.4 30.0 - 36.5 g/dL    RDW 13.9 11.5 - 14.5 %    PLATELET 266 150 - 400 K/uL    MPV 9.8 8.9 - 12.9 FL    NEUTROPHILS 83 (H) 32 - 75 %    LYMPHOCYTES 9 (L) 12 - 49 %    MONOCYTES 6 5 - 13 %    EOSINOPHILS 2 0 - 7 %    BASOPHILS 0 0 - 1 %    IMMATURE GRANULOCYTES 0 0.0 - 0.5 %    ABS. NEUTROPHILS 7.9 1.8 - 8.0 K/UL    ABS. LYMPHOCYTES 0.8 0.8 - 3.5 K/UL    ABS. MONOCYTES 0.5 0.0 - 1.0  K/UL    ABS. EOSINOPHILS 0.1 0.0 - 0.4 K/UL    ABS. BASOPHILS 0.0 0.0 - 0.1 K/UL    ABS. IMM. GRANS. 0.0 0.00 - 0.04 K/UL    DF AUTOMATED     METABOLIC PANEL, COMPREHENSIVE    Collection Time: 07/03/19  4:38 PM   Result Value Ref Range    Sodium 133 (L) 136 - 145 mmol/L    Potassium 6.3 (H) 3.5 - 5.1 mmol/L    Chloride 107 97 - 108 mmol/L    CO2 15 (LL) 21 - 32 mmol/L    Anion gap 11 5 - 15 mmol/L    Glucose 113 (H) 65 - 100 mg/dL    BUN 114 (H) 6 - 20 mg/dL    Creatinine 6.84 (H) 0.70 - 1.30 mg/dL    BUN/Creatinine ratio 17 12 - 20      GFR est AA 10 (L) >60 ml/min/1.60m    GFR est non-AA 8 (L) >60 ml/min/1.753m   Calcium 9.1 8.5 - 10.1 mg/dL    Bilirubin, total 0.6 0.2 - 1.0 mg/dL    AST (SGOT) 7 (L)  15 - 37 U/L    ALT (SGPT) 10 (L) 12 - 78 U/L    Alk. phosphatase 90 45 - 117 U/L    Protein, total 10.3 (H) 6.4 - 8.2 g/dL    Albumin 3.7 3.5 - 5.0 g/dL    Globulin 6.6 (H) 2.0 - 4.0 g/dL    A-G Ratio 0.6 (L) 1.1 - 2.2     MAGNESIUM    Collection Time: 07/03/19  4:38 PM   Result Value Ref Range    Magnesium 2.6 (H) 1.6 - 2.4 mg/dL   PHOSPHORUS    Collection Time: 07/03/19  4:38 PM   Result Value Ref Range    Phosphorus 6.6 (H) 2.6 - 4.7 mg/dL   TROPONIN I    Collection Time: 07/03/19  4:38 PM   Result Value Ref Range    Troponin-I, Qt. <0.05 <0.05 ng/mL   BNP    Collection Time: 07/03/19  4:38 PM   Result Value Ref Range    NT pro-BNP 1,395 (H) <125 pg/mL   POTASSIUM, UR, RANDOM    Collection Time: 07/03/19  8:15 PM   Result Value Ref Range    Potassium urine, random 21 mmol/L   SODIUM, UR, RANDOM    Collection Time: 07/03/19  8:15 PM   Result Value Ref Range    Sodium,urine random 90 mmol/L   METABOLIC PANEL, BASIC    Collection Time: 07/04/19 12:05 AM   Result Value Ref Range    Sodium 133 (L) 136 - 145 mmol/L    Potassium 5.0 3.5 - 5.1 mmol/L    Chloride 104 97 - 108 mmol/L    CO2 19 (L) 21 - 32 mmol/L    Anion gap 10 5 - 15 mmol/L    Glucose 115 (H) 65 - 100 mg/dL    BUN 109 (H) 6 - 20 mg/dL    Creatinine 5.94 (H) 0.70 - 1.30 mg/dL    BUN/Creatinine ratio 18 12 - 20      GFR est AA 12 (L) >60 ml/min/1.734m  GFR est non-AA 10 (L) >60 ml/min/1.44m66m Calcium 9.0 8.5 - 10.195.1dL   METABOLIC PANEL, COMPREHENSIVE    Collection Time: 07/04/19  3:45 AM   Result Value Ref Range    Sodium 134 (L) 136 - 145 mmol/L    Potassium 4.6  3.5 - 5.1 mmol/L    Chloride 105 97 - 108 mmol/L    CO2 20 (L) 21 - 32 mmol/L    Anion gap 9 5 - 15 mmol/L    Glucose 90 65 - 100 mg/dL    BUN 103 (H) 6 - 20 mg/dL    Creatinine 5.28 (H) 0.70 - 1.30 mg/dL    BUN/Creatinine ratio 20 12 - 20      GFR est AA 13 (L) >60 ml/min/1.62m    GFR est non-AA 11 (L) >60 ml/min/1.749m   Calcium 8.1 (L) 8.5 - 10.1 mg/dL    Bilirubin, total 0.5 0.2 - 1.0  mg/dL    AST (SGOT) 5 (L) 15 - 37 U/L    ALT (SGPT) 9 (L) 12 - 78 U/L    Alk. phosphatase 72 45 - 117 U/L    Protein, total 8.3 (H) 6.4 - 8.2 g/dL    Albumin 2.9 (L) 3.5 - 5.0 g/dL    Globulin 5.4 (H) 2.0 - 4.0 g/dL    A-G Ratio 0.5 (L) 1.1 - 2.2     MAGNESIUM    Collection Time: 07/04/19  3:45 AM   Result Value Ref Range    Magnesium 2.0 1.6 - 2.4 mg/dL   CBC WITH AUTOMATED DIFF    Collection Time: 07/04/19  3:45 AM   Result Value Ref Range    WBC 7.9 4.1 - 11.1 K/uL    RBC 3.38 (L) 4.10 - 5.70 M/uL    HGB 10.2 (L) 12.1 - 17.0 g/dL    HCT 30.2 (L) 36.6 - 50.3 %    MCV 89.3 80.0 - 99.0 FL    MCH 30.2 26.0 - 34.0 PG    MCHC 33.8 30.0 - 36.5 g/dL    RDW 13.6 11.5 - 14.5 %    PLATELET 243 150 - 400 K/uL    MPV 9.9 8.9 - 12.9 FL    NEUTROPHILS 75 32 - 75 %    LYMPHOCYTES 13 12 - 49 %    MONOCYTES 7 5 - 13 %    EOSINOPHILS 5 0 - 7 %    BASOPHILS 0 0 - 1 %    IMMATURE GRANULOCYTES 0 0.0 - 0.5 %    ABS. NEUTROPHILS 5.9 1.8 - 8.0 K/UL    ABS. LYMPHOCYTES 1.0 0.8 - 3.5 K/UL    ABS. MONOCYTES 0.6 0.0 - 1.0 K/UL    ABS. EOSINOPHILS 0.4 0.0 - 0.4 K/UL    ABS. BASOPHILS 0.0 0.0 - 0.1 K/UL    ABS. IMM. GRANS. 0.0 0.00 - 0.04 K/UL    DF AUTOMATED     SARS-COV-2    Collection Time: 07/04/19  6:30 AM   Result Value Ref Range    SARS-CoV-2 Please find results under separate order     COVID-19 RAPID TEST    Collection Time: 07/04/19  6:30 AM   Result Value Ref Range    Specimen source Nasopharyngeal      COVID-19 rapid test Not Detected Not Detected     GLUCOSE, POC    Collection Time: 07/04/19  8:19 AM   Result Value Ref Range    Glucose (POC) 155 (H) 65 - 100 mg/dL    Performed by [employer]    Collection Time: 07/04/19 12:50 PM   Result Value Ref Range    Protein, urine random 48 (H) 0.0 - 11.9 mg/dL   CREATININE, UR, RANDOM  Collection Time: 07/04/19 12:50 PM   Result Value Ref Range    Creatinine, urine 29.00 mg/dL   GLUCOSE, POC    Collection Time: 07/04/19  4:02 PM   Result Value Ref Range     Glucose (POC) 112 (H) 65 - 100 mg/dL    Performed by Stump Josie        Results     Procedure Component Value Units Date/Time    MRSA SCREEN - PCR (NASAL) [[id number]] Collected: 07/04/19 1543    Order Status: Completed Specimen: Swab Updated: 07/04/19 1611    COVID-19 RAPID TEST [[id number]] Collected: 07/04/19 0630    Order Status: Completed Specimen: Nasopharyngeal Updated: 07/04/19 0738     Specimen source Nasopharyngeal        COVID-19 rapid test Not Detected        Comment: Rapid Abbott ID Now   Rapid NAAT:  The specimen is NEGATIVE for SARS-CoV-2, the novel coronavirus associated with COVID-19.   Negative results should be treated as presumptive and, if inconsistent with clinical signs and symptoms or necessary for patient management, should be tested with an alternative molecular assay. Negative results do not preclude SARS-CoV-2 infection and should not be used as the sole basis for patient management decisions.   This test has been authorized by the FDA under   an Emergency Use Authorization (EUA) for use by authorized laboratories. Fact sheet for Healthcare Providers: LittleDVDs.dk Fact sheet for Patients: SatelliteRebate.it   Methodology: Isothermal Nucleic Acid Amplification         CULTURE, URINE [[id number]] Collected: 07/03/19 1615    Order Status: Completed Specimen: Urine Updated: 07/03/19 2109           Labs:     Recent Labs     07/04/19  0345 07/03/19  1638   WBC 7.9 9.4   HGB 10.2* 14.4   HCT 30.2* 41.8   PLT 243 266     Recent Labs     07/04/19  0345 07/04/19  0005 07/03/19  1638   NA 134* 133* 133*   K 4.6 5.0 6.3*   CL 105 104 107   CO2 20* 19* 15*   BUN 103* 109* 114*   CREA 5.28* 5.94* 6.84*   GLU 90 115* 113*   CA 8.1* 9.0 9.1   MG 2.0  --  2.6*   PHOS  --   --  6.6*     Recent Labs     07/04/19  0345 07/03/19  1638   ALT 9* 10*   AP 72 90   TBILI 0.5 0.6   TP 8.3* 10.3*   ALB 2.9* 3.7   GLOB 5.4* 6.6*     No results for input(s): INR, PTP,  APTT, INREXT in the last 72 hours.   No results for input(s): FE, TIBC, PSAT, FERR in the last 72 hours.   No results found for: FOL, RBCF   No results for input(s): PH, PCO2, PO2 in the last 72 hours.  Recent Labs     07/03/19  1638   TROIQ <0.05     No results found for: CHOL, CHOLX, CHLST, CHOLV, HDL, HDLP, LDL, LDLC, DLDLP, TGLX, TRIGL, TRIGP, CHHD, CHHDX  Lab Results   Component Value Date/Time    Glucose (POC) 112 (H) 07/04/2019 04:02 PM    Glucose (POC) 155 (H) 07/04/2019 08:19 AM     Lab Results   Component Value Date/Time    Color Yellow/Straw 07/03/2019 04:15 PM  Appearance Turbid (A) 07/03/2019 04:15 PM    Specific gravity 1.008 07/03/2019 04:15 PM    pH (UA) 6.0 07/03/2019 04:15 PM    Protein 100 (A) 07/03/2019 04:15 PM    Glucose 50 (A) 07/03/2019 04:15 PM    Ketone Negative 07/03/2019 04:15 PM    Bilirubin Negative 07/03/2019 04:15 PM    Urobilinogen 0.1 07/03/2019 04:15 PM    Nitrites Negative 07/03/2019 04:15 PM    Leukocyte Esterase Moderate (A) 07/03/2019 04:15 PM    Bacteria Negative 07/03/2019 04:15 PM    WBC >100 (H) 07/03/2019 04:15 PM    RBC >100 (H) 07/03/2019 04:15 PM         Assessment:     Acute kidney injury  Bilateral hydronephrosis  Hypotension  Urinary retention  Protein calorie malnutrition moderate  Electrolyte disturbances -hyponatremia, hyperkalemia, hyperphosphatemia, hypermagnesemia  Decreased mobility  History of cardiac disease and pacemaker  History of high blood pressure  History of prostate cancer  Abnormal chest x-ray suggestive of emphysema -patient does not currently smoke  Cachexia    Plan:     Continue IV fluids  Levophed for hypotension  Blood cultures urine cultures start on IV Rocephin  Repeat ultrasound of the kidney will monitor hydronephrosis  Repeat the labs in the morning  Follow-up with infectious disease and nephrologist        Current Facility-Administered Medications:   ???  0.9% sodium chloride infusion, 100 mL/hr, IntraVENous, CONTINUOUS, Quisha Mabie,  MD, Last Rate: 100 mL/hr at 07/04/19 1400, 100 mL/hr at 07/04/19 1400  ???  tamsulosin (FLOMAX) capsule 0.4 mg, 0.4 mg, Oral, QHS, Gerhard Perches, MD  ???  sodium bicarbonate tablet 650 mg, 650 mg, Oral, BID, Gerhard Perches, MD, 650 mg at 07/04/19 1445  ???  NOREPINephrine (LEVOPHED) 8 mg in 5% dextrose 262m (32 mcg/mL) infusion, 0.5-30 mcg/min, IntraVENous, TITRATE, Munachimso Palin, MD, Last Rate: 5.6 mL/hr at 07/04/19 1541, 3 mcg/min at 07/04/19 1541  ???  insulin lispro (HUMALOG) injection, , SubCUTAneous, ACB&D, RDemetrios Loll NP, Stopped at 07/04/19 0730  ???  glucose chewable tablet 16 g, 4 Tab, Oral, PRN, RDemetrios Loll NP  ???  dextrose (D50W) injection syrg 12.5-25 g, 25-50 mL, IntraVENous, PRN, RDemetrios Loll NP  ???  glucagon (GLUCAGEN) injection 1 mg, 1 mg, IntraMUSCular, PRN, RDemetrios Loll NP  ???  sodium chloride (NS) flush 5-40 mL, 5-40 mL, IntraVENous, Q8H, RDemetrios Loll NP, 10 mL at 07/04/19 1400  ???  sodium chloride (NS) flush 5-40 mL, 5-40 mL, IntraVENous, PRN, RDemetrios Loll NP  ???  acetaminophen (TYLENOL) tablet 650 mg, 650 mg, Oral, Q6H PRN **OR** acetaminophen (TYLENOL) suppository 650 mg, 650 mg, Rectal, Q6H PRN, RDemetrios Loll NP  ???  polyethylene glycol (MIRALAX) packet 17 g, 17 g, Oral, DAILY PRN, RDemetrios Loll NP  ???  promethazine (PHENERGAN) tablet 12.5 mg, 12.5 mg, Oral, Q6H PRN **OR** ondansetron (ZOFRAN) injection 4 mg, 4 mg, IntraVENous, Q6H PRN, RDemetrios Loll NP  ???  heparin (porcine) injection 5,000 Units, 5,000 Units, SubCUTAneous, Q8H, RDemetrios Loll NP, 5,000 Units at 07/04/19 1603  ???  cefTRIAXone (ROCEPHIN) 1 g in sterile water (preservative free) 10 mL IV syringe, 1 g, IntraVENous, Q24H, RDemetrios Loll NP, 1 g at 07/03/19 2122

## 2019-07-04 NOTE — Consults (Signed)
Consult Date: 07/04/2019    Consults:  Sepsis    Subjective   This is a 64 year old male who presented to the ED with 2 day history of fatigue, generalized weakness and SOB.  He was afebrile with normal WBC.  Urinalysis showed marked pyuria but no bacteria.  Covid-19 not detected. CXR showed emphysema/bullous cysts, small volume linear reticular markings left lung base.  Renal ultrasound showed bilateral hydronephrosis with distended bladder.  Urine culture was sent and patient started on Ceftriaxone. Patient transferred to ICU because of hypotension.  ID consulted for this reason.     Patient seen in the ICU where he awake and responsive but confused and a poor historian.  He did affirm problems with urination with cloudy urine.  Also affirmed SOB but no cough or sputum production.      Review of the EMR reveals that patient has been followed at [facility] with diagnosis of HIV infection on Dolutegravir/Emtriva/Tenofovir (TAF) , as well as Dapsone which implies that his CD4 is <200.  When confronted, patient denies any knowledge of HIV infection.  Past Medical History:   Diagnosis Date   ??? Prostate cancer [facility])       History reviewed. No pertinent surgical history.  History reviewed. No pertinent family history.   Social History     Tobacco Use   ??? Smoking status: Not on file   Substance Use Topics   ??? Alcohol use: Not on file       Current Facility-Administered Medications   Medication Dose Route Frequency Provider Last Rate Last Admin   ??? 0.9% sodium chloride infusion  100 mL/hr IntraVENous CONTINUOUS Mohiuddin, Abdul, MD 100 mL/hr at 07/04/19 1400 100 mL/hr at 07/04/19 1400   ??? tamsulosin (FLOMAX) capsule 0.4 mg  0.4 mg Oral QHS Gerhard Perches, MD       ??? sodium bicarbonate tablet 650 mg  650 mg Oral BID Gerhard Perches, MD   650 mg at 07/04/19 1445   ??? NOREPINephrine (LEVOPHED) 8 mg in 5% dextrose 247m (32 mcg/mL) infusion  0.5-30 mcg/min IntraVENous TITRATE Mohiuddin, AMarcy Siren MD   Stopped at 07/04/19 1744   ???  insulin lispro (HUMALOG) injection   SubCUTAneous ACB&D RDemetrios Loll NP   Stopped at 07/04/19 0730   ??? glucose chewable tablet 16 g  4 Tab Oral PRN RDemetrios Loll NP       ??? dextrose (D50W) injection syrg 12.5-25 g  25-50 mL IntraVENous PRN RDemetrios Loll NP       ??? glucagon (GLUCAGEN) injection 1 mg  1 mg IntraMUSCular PRN RDemetrios Loll NP       ??? sodium chloride (NS) flush 5-40 mL  5-40 mL IntraVENous Q8H RDemetrios Loll NP   10 mL at 07/04/19 1400   ??? sodium chloride (NS) flush 5-40 mL  5-40 mL IntraVENous PRN RDemetrios Loll NP       ??? acetaminophen (TYLENOL) tablet 650 mg  650 mg Oral Q6H PRN RDemetrios Loll NP        Or   ??? acetaminophen (TYLENOL) suppository 650 mg  650 mg Rectal Q6H PRN RDemetrios Loll NP       ??? polyethylene glycol (MIRALAX) packet 17 g  17 g Oral DAILY PRN RDemetrios Loll NP       ??? promethazine (PHENERGAN) tablet 12.5 mg  12.5 mg Oral Q6H PRN RDemetrios Loll NP        Or   ??? ondansetron (ZOFRAN) injection 4 mg  4 mg  IntraVENous Q6H PRN Demetrios Loll, NP       ??? heparin (porcine) injection 5,000 Units  5,000 Units SubCUTAneous Q8H Demetrios Loll, NP   5,000 Units at 07/04/19 1603   ??? cefTRIAXone (ROCEPHIN) 1 g in sterile water (preservative free) 10 mL IV syringe  1 g IntraVENous Q24H Demetrios Loll, NP   1 g at 07/03/19 2122        Review of Systems   Constitutional: Positive for fatigue. Negative for chills and fever.   HENT: Negative.    Eyes: Negative.    Respiratory: Positive for shortness of breath. Negative for cough.    Cardiovascular: Negative.    Gastrointestinal: Negative.    Endocrine: Negative.    Genitourinary: Positive for dysuria. Negative for frequency and hematuria.   Musculoskeletal: Negative.    Skin: Negative.    Allergic/Immunologic: Positive for immunocompromised state.   Neurological: Positive for weakness.   Hematological: Negative.    Psychiatric/Behavioral: Negative.        Objective     Vital signs for last 24 hours:  Visit  Vitals  BP 119/87   Pulse 75   Temp 97.9 ??F (36.6 ??C)   Resp 15   Ht 5' 8"  (1.727 m)   Wt 120 lb (54.4 kg)   SpO2 100%   BMI 18.25 kg/m??       Intake/Output this shift:  Current Shift: 05/04 0701 - 05/04 1900  In: 500 [I.V.:500]  Out: 1900 [Urine:1900]  Last 3 Shifts: 05/02 1901 - 05/04 0700  In: 1100 [I.V.:1100]  Out: 200 [Urine:200]    Data Review:   Recent Results (from the past 24 hour(s))   POTASSIUM, UR, RANDOM    Collection Time: 07/03/19  8:15 PM   Result Value Ref Range    Potassium urine, random 21 mmol/L   SODIUM, UR, RANDOM    Collection Time: 07/03/19  8:15 PM   Result Value Ref Range    Sodium,urine random 90 mmol/L   METABOLIC PANEL, BASIC    Collection Time: 07/04/19 12:05 AM   Result Value Ref Range    Sodium 133 (L) 136 - 145 mmol/L    Potassium 5.0 3.5 - 5.1 mmol/L    Chloride 104 97 - 108 mmol/L    CO2 19 (L) 21 - 32 mmol/L    Anion gap 10 5 - 15 mmol/L    Glucose 115 (H) 65 - 100 mg/dL    BUN 109 (H) 6 - 20 mg/dL    Creatinine 5.94 (H) 0.70 - 1.30 mg/dL    BUN/Creatinine ratio 18 12 - 20      GFR est AA 12 (L) >60 ml/min/1.40m    GFR est non-AA 10 (L) >60 ml/min/1.739m   Calcium 9.0 8.5 - 1016.1g/dL   METABOLIC PANEL, COMPREHENSIVE    Collection Time: 07/04/19  3:45 AM   Result Value Ref Range    Sodium 134 (L) 136 - 145 mmol/L    Potassium 4.6 3.5 - 5.1 mmol/L    Chloride 105 97 - 108 mmol/L    CO2 20 (L) 21 - 32 mmol/L    Anion gap 9 5 - 15 mmol/L    Glucose 90 65 - 100 mg/dL    BUN 103 (H) 6 - 20 mg/dL    Creatinine 5.28 (H) 0.70 - 1.30 mg/dL    BUN/Creatinine ratio 20 12 - 20      GFR est AA 13 (L) >60 ml/min/1.7369m  GFR est non-AA  11 (L) >60 ml/min/1.25m    Calcium 8.1 (L) 8.5 - 10.1 mg/dL    Bilirubin, total 0.5 0.2 - 1.0 mg/dL    AST (SGOT) 5 (L) 15 - 37 U/L    ALT (SGPT) 9 (L) 12 - 78 U/L    Alk. phosphatase 72 45 - 117 U/L    Protein, total 8.3 (H) 6.4 - 8.2 g/dL    Albumin 2.9 (L) 3.5 - 5.0 g/dL    Globulin 5.4 (H) 2.0 - 4.0 g/dL    A-G Ratio 0.5 (L) 1.1 - 2.2     MAGNESIUM     Collection Time: 07/04/19  3:45 AM   Result Value Ref Range    Magnesium 2.0 1.6 - 2.4 mg/dL   CBC WITH AUTOMATED DIFF    Collection Time: 07/04/19  3:45 AM   Result Value Ref Range    WBC 7.9 4.1 - 11.1 K/uL    RBC 3.38 (L) 4.10 - 5.70 M/uL    HGB 10.2 (L) 12.1 - 17.0 g/dL    HCT 30.2 (L) 36.6 - 50.3 %    MCV 89.3 80.0 - 99.0 FL    MCH 30.2 26.0 - 34.0 PG    MCHC 33.8 30.0 - 36.5 g/dL    RDW 13.6 11.5 - 14.5 %    PLATELET 243 150 - 400 K/uL    MPV 9.9 8.9 - 12.9 FL    NEUTROPHILS 75 32 - 75 %    LYMPHOCYTES 13 12 - 49 %    MONOCYTES 7 5 - 13 %    EOSINOPHILS 5 0 - 7 %    BASOPHILS 0 0 - 1 %    IMMATURE GRANULOCYTES 0 0.0 - 0.5 %    ABS. NEUTROPHILS 5.9 1.8 - 8.0 K/UL    ABS. LYMPHOCYTES 1.0 0.8 - 3.5 K/UL    ABS. MONOCYTES 0.6 0.0 - 1.0 K/UL    ABS. EOSINOPHILS 0.4 0.0 - 0.4 K/UL    ABS. BASOPHILS 0.0 0.0 - 0.1 K/UL    ABS. IMM. GRANS. 0.0 0.00 - 0.04 K/UL    DF AUTOMATED     SARS-COV-2    Collection Time: 07/04/19  6:30 AM   Result Value Ref Range    SARS-CoV-2 Please find results under separate order     COVID-19 RAPID TEST    Collection Time: 07/04/19  6:30 AM   Result Value Ref Range    Specimen source Nasopharyngeal      COVID-19 rapid test Not Detected Not Detected     GLUCOSE, POC    Collection Time: 07/04/19  8:19 AM   Result Value Ref Range    Glucose (POC) 155 (H) 65 - 100 mg/dL    Performed by RAckerly RANDOM    Collection Time: 07/04/19 12:50 PM   Result Value Ref Range    Protein, urine random 48 (H) 0.0 - 11.9 mg/dL   CREATININE, UR, RANDOM    Collection Time: 07/04/19 12:50 PM   Result Value Ref Range    Creatinine, urine 29.00 mg/dL   GLUCOSE, POC    Collection Time: 07/04/19  4:02 PM   Result Value Ref Range    Glucose (POC) 112 (H) 65 - 100 mg/dL    Performed by Stump Josie        Physical Exam  Vitals signs (Extremely hypertensive) and nursing note reviewed.   Constitutional:       General: He is not in acute distress.  Appearance: Normal appearance. He is  ill-appearing.   HENT:      Head: Normocephalic and atraumatic.      Right Ear: External ear normal.      Left Ear: External ear normal.      Nose: Nose normal.      Mouth/Throat:      Pharynx: Oropharynx is clear.      Comments: Multiple missing teeth  Eyes:      Pupils: Pupils are equal, round, and reactive to light.   Neck:      Musculoskeletal: Neck supple.   Cardiovascular:      Rate and Rhythm: Regular rhythm. Tachycardia present.      Heart sounds: Normal heart sounds. No murmur.   Pulmonary:      Breath sounds: No wheezing, rhonchi or rales.   Abdominal:      General: Abdomen is flat. Bowel sounds are normal.      Palpations: Abdomen is soft.      Tenderness: There is no abdominal tenderness.   Genitourinary:     Comments: Foley catheter  Musculoskeletal:      Right lower leg: No edema.      Left lower leg: No edema.   Skin:     Findings: No lesion or rash.   Neurological:      General: No focal deficit present.      Mental Status: He is alert. He is disoriented.   Psychiatric:         Mood and Affect: Mood normal.         Behavior: Behavior normal.         Thought Content: Thought content normal.       ASSESSMENT/PLAN    1. Possible complicated UTI (bilateral hydronephrosis/distended bladder), with marked pyuria, urine culture pending  2. HIV-1 infection per medical records  3. Hypotension, etiology unclear, ?sepsis  4. Renal failure  5. Penicillin allergy    Comment:  Strong case for UTI, no history of resistant organisms in our data base, but would broaden Gram negative coverage pending culture results.  Need to confirm history of HIV infection.    1. Discontinue Ceftriaxone  2. Start IV Meropenem pending urine culture results  3. In am, check procal and CRP, HIV-1 Ab, HIV-1 RNA and CD4 count  4. Contact [facility] regarding HIV diagnosis; send for medical records    Grant A. Trudie Reed, MD

## 2019-07-04 NOTE — ED Notes (Signed)
Dr. Gabriel Earing paged regarding patient's blood pressure 88/65 at rest.    Order received for 500 mL bolus and then 100 mL/hour

## 2019-07-04 NOTE — ED Notes (Signed)
Dr. Gabriel Earing in ED for re-evaluation of patient due to patient dropping BP to 70s systolic despite 211 mL bolus then maintenance fluids at 100 mL/hour.      2nd 500 mL bolus initiated and BP up to 86 systolic.  Patient to be transferred to the ICU order placed and nursing supervisor notified of the bed request change.

## 2019-07-04 NOTE — ED Notes (Signed)
Nephrologist at bedside at this time.

## 2019-07-05 LAB — CBC WITH AUTOMATED DIFF
ABS. BASOPHILS: 0 10*3/uL (ref 0.0–0.1)
ABS. EOSINOPHILS: 0.4 10*3/uL (ref 0.0–0.4)
ABS. IMM. GRANS.: 0 10*3/uL (ref 0.00–0.04)
ABS. LYMPHOCYTES: 1 10*3/uL (ref 0.8–3.5)
ABS. MONOCYTES: 0.4 10*3/uL (ref 0.0–1.0)
ABS. NEUTROPHILS: 3.6 10*3/uL (ref 1.8–8.0)
ABSOLUTE NRBC: 0 10*3/uL (ref 0.00–0.01)
BASOPHILS: 1 % (ref 0–1)
EOSINOPHILS: 7 % (ref 0–7)
HCT: 30.3 % — ABNORMAL LOW (ref 36.6–50.3)
HGB: 9.8 g/dL — ABNORMAL LOW (ref 12.1–17.0)
IMMATURE GRANULOCYTES: 0 % (ref 0–0.5)
LYMPHOCYTES: 18 % (ref 12–49)
MCH: 30 PG (ref 26.0–34.0)
MCHC: 32.3 g/dL (ref 30.0–36.5)
MCV: 92.7 FL (ref 80.0–99.0)
MONOCYTES: 8 % (ref 5–13)
MPV: 10.2 FL (ref 8.9–12.9)
NEUTROPHILS: 66 % (ref 32–75)
NRBC: 0 PER 100 WBC
PLATELET: 209 10*3/uL (ref 150–400)
RBC: 3.27 M/uL — ABNORMAL LOW (ref 4.10–5.70)
RDW: 13.2 % (ref 11.5–14.5)
WBC: 5.4 10*3/uL (ref 4.1–11.1)

## 2019-07-05 LAB — METABOLIC PANEL, COMPREHENSIVE
A-G Ratio: 0.5 — ABNORMAL LOW (ref 1.1–2.2)
ALT (SGPT): 8 U/L — ABNORMAL LOW (ref 12–78)
AST (SGOT): 5 U/L — ABNORMAL LOW (ref 15–37)
Albumin: 2.4 g/dL — ABNORMAL LOW (ref 3.5–5.0)
Alk. phosphatase: 58 U/L (ref 45–117)
Anion gap: 7 mmol/L (ref 5–15)
BUN/Creatinine ratio: 23 — ABNORMAL HIGH (ref 12–20)
BUN: 81 mg/dL — ABNORMAL HIGH (ref 6–20)
Bilirubin, total: 0.2 mg/dL (ref 0.2–1.0)
CO2: 20 mmol/L — ABNORMAL LOW (ref 21–32)
Calcium: 7 mg/dL — ABNORMAL LOW (ref 8.5–10.1)
Chloride: 114 mmol/L — ABNORMAL HIGH (ref 97–108)
Creatinine: 3.56 mg/dL — ABNORMAL HIGH (ref 0.70–1.30)
GFR est AA: 21 mL/min/{1.73_m2} — ABNORMAL LOW (ref >60)
GFR est non-AA: 17 mL/min/{1.73_m2} — ABNORMAL LOW (ref >60)
Globulin: 4.6 g/dL — ABNORMAL HIGH (ref 2.0–4.0)
Glucose: 103 mg/dL — ABNORMAL HIGH (ref 65–100)
Potassium: 4.2 mmol/L (ref 3.5–5.1)
Protein, total: 7 g/dL (ref 6.4–8.2)
Sodium: 141 mmol/L (ref 136–145)

## 2019-07-05 LAB — C REACTIVE PROTEIN, QT: C-Reactive protein: 2.01 mg/dL — ABNORMAL HIGH (ref 0.00–0.60)

## 2019-07-05 LAB — HIV 1/2 AG/AB, 4TH GENERATION,W RFLX CONFIRM: HIV 1/2 Interpretation: REACTIVE — AB

## 2019-07-05 LAB — CULTURE, URINE: Culture result:: NO GROWTH

## 2019-07-05 LAB — RENAL FUNCTION PANEL
Albumin: 2.4 g/dL — ABNORMAL LOW (ref 3.5–5.0)
Anion gap: 5 mmol/L (ref 5–15)
BUN/Creatinine ratio: 23 — ABNORMAL HIGH (ref 12–20)
BUN: 81 mg/dL — ABNORMAL HIGH (ref 6–20)
CO2: 21 mmol/L (ref 21–32)
Calcium: 7.1 mg/dL — ABNORMAL LOW (ref 8.5–10.1)
Chloride: 114 mmol/L — ABNORMAL HIGH (ref 97–108)
Creatinine: 3.55 mg/dL — ABNORMAL HIGH (ref 0.70–1.30)
GFR est AA: 21 mL/min/{1.73_m2} — ABNORMAL LOW (ref >60)
GFR est non-AA: 17 mL/min/{1.73_m2} — ABNORMAL LOW (ref >60)
Glucose: 103 mg/dL — ABNORMAL HIGH (ref 65–100)
Phosphorus: 3.4 mg/dL (ref 2.6–4.7)
Potassium: 4.2 mmol/L (ref 3.5–5.1)
Sodium: 140 mmol/L (ref 136–145)

## 2019-07-05 LAB — PTH INTACT
Calcium: 7.1 mg/dL — ABNORMAL LOW (ref 8.5–10.1)
PTH, Intact: 243.1 pg/mL — ABNORMAL HIGH (ref 18.4–88.0)

## 2019-07-05 LAB — IRON PROFILE
Iron % saturation: 62 % — ABNORMAL HIGH (ref 20–50)
Iron: 111 ug/dL (ref 35–150)
TIBC: 178 ug/dL — ABNORMAL LOW (ref 250–450)

## 2019-07-05 LAB — GLUCOSE, POC
Glucose (POC): 139 mg/dL — ABNORMAL HIGH (ref 65–100)
Glucose (POC): 130 mg/dL — ABNORMAL HIGH (ref 65–100)
Glucose (POC): 118 mg/dL — ABNORMAL HIGH (ref 65–100)

## 2019-07-05 LAB — OSMOLALITY, UR: Osmolality,urine: 357 MOSM/kg H2O

## 2019-07-05 LAB — PROCALCITONIN: Procalcitonin: 0.34 ng/mL — ABNORMAL HIGH

## 2019-07-05 LAB — MRSA SCREEN - PCR (NASAL): MRSA by PCR, Nasal: NOT DETECTED

## 2019-07-05 MED ORDER — MEROPENEM 500 MG IV SOLR
500 mg | Freq: Two times a day (BID) | INTRAVENOUS | Status: DC
Start: 2019-07-05 — End: 2019-07-12
  Administered 2019-07-05 – 2019-07-12 (×15): via INTRAVENOUS

## 2019-07-05 MED FILL — SODIUM BICARBONATE 650 MG TAB: 650 mg | ORAL | Qty: 1

## 2019-07-05 MED FILL — MEROPENEM 500 MG IV SOLR: 500 mg | INTRAVENOUS | Qty: 500

## 2019-07-05 MED FILL — HEPARIN (PORCINE) 5,000 UNIT/ML IJ SOLN: 5000 unit/mL | INTRAMUSCULAR | Qty: 1

## 2019-07-05 MED FILL — CEFTRIAXONE 1 GRAM SOLUTION FOR INJECTION: 1 gram | INTRAMUSCULAR | Qty: 1

## 2019-07-05 MED FILL — TAMSULOSIN SR 0.4 MG 24 HR CAP: 0.4 mg | ORAL | Qty: 1

## 2019-07-05 NOTE — Progress Notes (Signed)
REPORT GIVEN TO K STARGART; RN.

## 2019-07-05 NOTE — Other (Signed)
Bedside shift report given to Di'Anna Cooper, RN.

## 2019-07-05 NOTE — Consults (Signed)
UROLOGY CONSULT    Kaan Tosh L. [place], [place] [facility]    Patient: Wesley Clark MRN: [id number]  SSN: [id number]    Date of Birth: Jul 25, 1955  Age: 65 y.o.  Sex: male          Date of Encounter:  Jul 06, 2019  ADMITTED: 07/03/2019 to Mohiuddin, Marcy Siren, MD by Glynn Octave, MD for Renal failure [N19];AKI (acute kidney injury) ([place]) [N17.9];Hyperkalemia [E87.5];Electrolyte imbalance [E87.8]  Chief Complaint:    Reason for consult: Hydronephrosis           History of Present Illness:  Patient is a 64 y.o. male admitted 07/03/2019 to the hospital for Renal failure [N19];AKI (acute kidney injury) ([place]) [N17.9];Hyperkalemia [E87.5];Electrolyte imbalance [E87.8].  He presented on 07/03/2019 with fatigue and shortness of breath.  He was confused.  Creatinine was 6.84 with potassium of 6.3.  He had greater than 100 white blood cells and greater than 100 red blood cells per hpf.  Urine culture was sent 5/3 and had no growth at 2 days.  Ultrasound revealed bilateral hydronephrosis and a distended bladder.   The bladder appeared to have sludge. Foley inserted (during exam).  He denies urinary difficulty. He denies flank pain.   Repeat US on 07/04/19 continued to show bilateral hydronephrosis and bladder decompression s/p foley placement.   ??  Creatinine on admit was 6.84, now 3.56.  ??  He is on Merem via Dr. Trudie Reed.  He is diagnosed with HIV.  Social determinants of health factor in as he has missed appointments for this.     H/o prostate cancer treated at the [place].  He think he had a medication.  He does not recall radiation therapy.    Past Medical History:  Allergies   Allergen Reactions   ??? Penicillins Rash      Prior to Admission medications    Not on File      PMHx:  has a past medical history of Prostate cancer ([place]). Pacemaker  PSurgHx:  has no past surgical history on file.  PSocHx:     ROS:  Admission ROS by Glynn Octave, MD from 07/03/2019 were reviewed with the patient and changes (other than per HPI)  include: none.  All 12 systems were reviewed and were otherwise negative.    Physical Exam:            Vitals::    Temp (24hrs), Avg:97.6 ??F (36.4 ??C), Min:97.5 ??F (36.4 ??C), Max:97.9 ??F (36.6 ??C)   Blood pressure 110/67, pulse 77, temperature 97.5 ??F (36.4 ??C), resp. rate 12, height 5\' 8"  (1.727 m), weight 105 lb 9.6 oz (47.9 kg), SpO2 100 %.   Estimated body mass index is 16.06 kg/m?? as calculated from the following:    Height as of this encounter: 5\' 8"  (1.727 m).    Weight as of this encounter: 105 lb 9.6 oz (47.9 kg).             I&O's:    No intake/output data recorded.   General Well developed, in NAD   Conjunctiva/Lids Normal without gross defects   Pupil/Iris Pupils equal, round, reactive, anicteric   External Ears/Nose No lesions or deformities   Hearing  Grossly intact   Neck Supple without obvious, masses   Lymphatic No obvious supraclavicular or cervical adenopathy   Respiratory Effort Breathing easily, no audible wheezing, rhonchi, stridor   CV RRR   Abdomen / Flank Soft, non tender without guarding or rebound, without obvious masses, no CVA tenderness  Digits/ Nails No clubbing, cyanosis, petechiae   GU   Foley with clear urine   Skin Inspection Warm and dry, no obvious rashes   Neurologic Grossly normal without focal deficits   Judgement / Insight intact   Mood / Affect normal       Lab Results   Component Value Date/Time    WBC 5.4 07/05/2019 04:25 AM    HCT 30.3 (L) 07/05/2019 04:25 AM    PLATELET 209 07/05/2019 04:25 AM    Sodium 140 07/05/2019 04:25 AM    Sodium 141 07/05/2019 04:25 AM    Potassium 4.2 07/05/2019 04:25 AM    Potassium 4.2 07/05/2019 04:25 AM    Chloride 114 (H) 07/05/2019 04:25 AM    Chloride 114 (H) 07/05/2019 04:25 AM    CO2 21 07/05/2019 04:25 AM    CO2 20 (L) 07/05/2019 04:25 AM    BUN 81 (H) 07/05/2019 04:25 AM    BUN 81 (H) 07/05/2019 04:25 AM    Creatinine 3.55 (H) 07/05/2019 04:25 AM    Creatinine 3.56 (H) 07/05/2019 04:25 AM    Glucose 103 (H) 07/05/2019 04:25 AM     Glucose 103 (H) 07/05/2019 04:25 AM    Calcium 7.1 (L) 07/05/2019 04:25 AM    Calcium 7.1 (L) 07/05/2019 04:25 AM    Calcium 7.0 (L) 07/05/2019 04:25 AM    Magnesium 2.0 07/04/2019 03:45 AM       UA:   Lab Results   Component Value Date/Time    Color Yellow/Straw 07/03/2019 04:15 PM    Appearance Turbid (A) 07/03/2019 04:15 PM    Specific gravity 1.008 07/03/2019 04:15 PM    pH (UA) 6.0 07/03/2019 04:15 PM    Protein 100 (A) 07/03/2019 04:15 PM    Glucose 50 (A) 07/03/2019 04:15 PM    Ketone Negative 07/03/2019 04:15 PM    Bilirubin Negative 07/03/2019 04:15 PM    Urobilinogen 0.1 07/03/2019 04:15 PM    Nitrites Negative 07/03/2019 04:15 PM    Leukocyte Esterase Moderate (A) 07/03/2019 04:15 PM    Bacteria Negative 07/03/2019 04:15 PM    WBC >100 (H) 07/03/2019 04:15 PM    RBC >100 (H) 07/03/2019 04:15 PM       Xrays:       Diagnoses and all orders for this visit:    1. Acute renal failure, unspecified acute renal failure type (HCC)    2. Acute hyperkalemia    Other orders  -     CBC WITH AUTOMATED DIFF; Standing  -     METABOLIC PANEL, COMPREHENSIVE; Standing  -     MAGNESIUM; Standing  -     PHOSPHORUS; Standing  -     TROPONIN I; Standing  -     BNP; Standing  -     EKG, 12 LEAD, INITIAL; Standing  -     URINALYSIS W/ REFLEX CULTURE; Standing  -     XR CHEST SNGL V; Standing  -     sodium chloride 0.9 % bolus infusion 1,000 mL  -     sodium chloride 0.9 % bolus infusion 1,000 mL  -     furosemide (LASIX) injection 80 mg  -     calcium gluconate injection 1 g  -     dextrose (D50W) injection syrg 25 g  -     insulin regular (NOVOLIN R, HUMULIN R) injection 6 Units  -     sodium bicarbonate 8.4 % (1 mEq/mL) injection 50 mEq  -  FOLEY CATHETER, INSERTION; Standing  -     [place] RETROPERITONEUM COMP; Standing  -     INITIAL PHYSICIAN ORDER: INPATIENT; Standing  -     CARDIAC MONITORING; Standing  -     lactated ringers bolus infusion 1,000 mL  -     NURSING-MISCELLANEOUS:; Standing  -     NURSING-MISCELLANEOUS:;  Standing  -     NURSING-MISCELLANEOUS:; Standing  -     NURSING-MISCELLANEOUS:; Standing  -     NURSING-MISCELLANEOUS:; Standing  -     NURSING-MISCELLANEOUS:; Standing  -     REASON FOR NOT SELECTING BASAL INSULIN; Standing  -     insulin lispro (HUMALOG) injection  -     glucose chewable tablet 16 g  -     dextrose (D50W) injection syrg 12.5-25 g  -     glucagon (GLUCAGEN) injection 1 mg  -     NURSING-MISCELLANEOUS:; Standing  -     HEMOGLOBIN A1C WITH EAG; Standing  -     VITAL SIGNS; Standing  -     sodium chloride (NS) flush 5-40 mL  -     sodium chloride (NS) flush 5-40 mL  -     acetaminophen (TYLENOL) tablet 650 mg  -     acetaminophen (TYLENOL) suppository 650 mg  -     polyethylene glycol (MIRALAX) packet 17 g  -     promethazine (PHENERGAN) tablet 12.5 mg  -     ondansetron (ZOFRAN) injection 4 mg  -     heparin (porcine) injection 5,000 Units  -     MECHANICAL PROPHYLAXIS IS CONTRAINDICATED; Standing  -     INITIAL PHYSICIAN ORDER: INPATIENT; Standing  -     FULL CODE; Standing  -     NOTIFY PROVIDER: SPECIFY; Standing  -     ACTIVITY AS TOLERATED W/ASSIST; Standing  -     DIET CARDIAC; Standing  -     INTAKE AND OUTPUT; Standing  -     IP CONSULT TO NEPHROLOGY; Standing  -     OT--EVAL, DEVISE PLAN OF CARE AND TREAT; Standing  -     PT--EVAL, DEVISE PLAN OF CARE AND TREAT; Standing  -     IP CONSULT TO CASE MANAGEMENT; Standing  -     METABOLIC PANEL, COMPREHENSIVE; Standing  -     MAGNESIUM; Standing  -     CBC WITH AUTOMATED DIFF; Standing  -     POTASSIUM, UR, RANDOM; Standing  -     OSMOLALITY, UR; Standing  -     SODIUM, UR, RANDOM; Standing  -     METABOLIC PANEL, BASIC; Standing  -     CULTURE, URINE; Standing  -     CRITICAL CARE (ASAP ONLY); Standing  -     SARS-COV-2; Standing  -     COVID-19 RAPID TEST; Standing  -     GLUCOSE, POC; Standing  -     sodium chloride 0.9 % bolus infusion 500 mL  -     0.9% sodium chloride infusion  -     PROTEIN URINE, RANDOM; Standing  -     CREATININE, UR,  RANDOM; Standing  -     tamsulosin (FLOMAX) capsule 0.4 mg  -     sodium bicarbonate tablet 650 mg  -     RENAL FUNCTION PANEL; Standing  -     IRON PROFILE; Standing  -  VITAMIN D, 25 HYDROXY; Standing  -     PTH INTACT; Standing  -     PROSTATE-SPECIFIC AG; Standing  -     sodium chloride 0.9 % bolus infusion 500 mL  -     PICC INSERTION VASCULAR ACCESS TEAM; Standing  -     IP CONSULT TO INTERVENTIONAL RADIOLOGY; Standing  -     TRANSFER PATIENT; Standing  -     NOREPINephrine (LEVOPHED) 8 mg in 5% dextrose (32 mcg/mL) infusion  -     MRSA SCREEN - PCR (NASAL); Standing  -     GLUCOSE, POC; Standing  -     CBC WITH AUTOMATED DIFF; Standing  -     METABOLIC PANEL, COMPREHENSIVE; Standing  -     [place] RETROPERITONEUM COMP; Standing  -     meropenem (MERREM) 500 mg in sterile water (preservative free) 10 mL IV syringe  -     C REACTIVE PROTEIN, QT; Standing  -     PROCALCITONIN; Standing  -     HIV-1 RNA BY PCR, QT; Standing  -     HIV 1/2 AG/AB, 4TH GENERATION,W RFLX CONFIRM; Standing  -     LYMPHOCYTES, CD4 PERCENT AND ABSOLUTE; Standing  -     GLUCOSE, POC; Standing  -     IP CONSULT TO CASE MANAGEMENT; Standing  -     GLUCOSE, POC; Standing  -     RPR; Standing  -     C REACTIVE PROTEIN, QT; Standing  -     PROCALCITONIN; Standing  -     CBC WITH AUTOMATED DIFF; Standing  -     METABOLIC PANEL, COMPREHENSIVE; Standing  -     IP CONSULT TO UROLOGY; Standing  -     IP CONSULT TO INFECTIOUS DISEASES; Standing  -     DIET NPO; Standing  -     CT ABD PELV WO CONT; Standing  -     IRON PROFILE; Standing  -     DIET NUTRITIONAL SUPPLEMENTS; Standing             Assessment/Plan:  HIV+ : ID evaluating, CD4 count    BILATERAL HYDRONEPHROSIS:  [place] continued to demonstrate bilateral hydronephrosis despite catheterization.  Renal function is slowly improving.  Plan for cystoscopic evaluation, bilateral RPG and bilateral ureteral stent placement if necessary.  NPO   ??  AKI: admit creatinine was 6.84.  No knowledge of  baseline renal function.  Now 3.56 following foley catheterization and bladder decompression.  Plan for further evaluation via cysto, bilateral RPG and possible stent placement if necessary.  ??  UTI: marked pyuria on UA without bacteria.  Sludge noted on [place].  Culture pending, on Merem per ID.  Continue to follow.  ??  HX OF PROSTATE CANCER: unknown disease status.  PSA pending.  Patient does not recall last treatment or intervention.     Surgery planned: Cystoscopy, retrograde pyelograms  BILATERAL  ureteral stent placement    Signed By: Woodroe Chen, MD  - Jul 06, 2019

## 2019-07-05 NOTE — Progress Notes (Signed)
OCCUPATIONAL THERAPY EVALUATION  Patient: Wesley Clark (64 y.o. male)  Date: 07/05/2019  Primary Diagnosis: Renal failure [N19]  AKI (acute kidney injury) ([place]) [N17.9]  Hyperkalemia [E87.5]  Electrolyte imbalance [E87.8]  Procedure(s) (LRB):  CYSTOSCOPY BILATERAL RETROGRADE, BILATERAL URETERAL STENT PLACEMENT (Bilateral)     Precautions: fall risk       ASSESSMENT  Pt is a 64 y/o M with PMH of prostate cancer presented to [facility] ED with c/o generalized weakness and SOB for the past 2 to 3 days. Per medical chart, pt is poor historian and unable to offer any further information on his medical history and he does not know the names of his "heart pill" and "blood pressure pill" that he stopped taken a long time ago.  He also does not recall the last time he had treatment for his prostate cancer. ED work-up revealed a CBC basically unremarkable but a chemistry with sodium of 133, hyperkalemia potassium of 6.3, phosphorus 6.6, magnesium 2.6 creatinine 6.84 and GFR 10.  Foley catheter was inserted in the ED with foul-smelling urine returned. Pt transferred from ED to ICU, admitted 07/03/19 and being treated for renal failure, AKI, hyperkalemia, electrolyte imbalance.    Pt received semi-supine in bed upon arrival, AXO to person place and time, and agreeable to OT/PT evaluations at this time. Per pt report, pt lives alone in a one-story apartment home on the 5th floor with elevator access. Pt states he was IND with ADLs and ambulatory without DME at baseline, does not drive, able to cook TV dinners on his own.     Based on current observations, pt presents with deficits in generalized strength/AROM, static/dynamic sitting balance, static/dynamic standing balance, functional activity tolerance, and coordination impacting overall performance of ADLs and functional transfers/mobility.  Pt currently requires min A for bed mobility with increased time, fair sitting balance EOB with CGA-close SBA for static sitting balance and noted UE  shakiness (per pt, present at baseline). Pt requires total A to don socks 2' to impaired sitting balance, decreased strength. STS completed to/from EOB with RW and mod A x2, able to take 2-3 side steps toward [facility] with continued mod A x2 for balance safety before returning to bedside. Sit>supine mod A x2 back into bed. Overall, pt tolerates session fair today, is limited by generalized weakness and decreased activity tolerance. Pt would benefit from continued skilled OT services to address current impairments and improve overall IND and safety with self cares and functional transfers/mobility. Recommend discharge to SNF once medically appropriate.     Other factors to consider for discharge: lives alone, time since onset, severity of deficits, decline from baseline     Patient will benefit from skilled therapy intervention to address the above noted impairments.       PLAN :  Recommendations and Planned Interventions: self care training, functional mobility training, therapeutic exercise, balance training, therapeutic activities, cognitive retraining, endurance activities, neuromuscular re-education, patient education, and family training/education    Frequency/Duration: Patient will be followed by occupational therapy 5 times a week to address goals.    Recommendation for discharge: (in order for the patient to meet his/her long term goals)  Therapy up to 5 days/week in SNF setting    This discharge recommendation:  Has been made in collaboration with the attending provider and/or case management       SUBJECTIVE:   Patient stated ???I feel like i've had a whole days work??? at end of OT evaluation.     OBJECTIVE DATA  SUMMARY:   HISTORY:   Past Medical History:   Diagnosis Date    Prostate cancer ([place])    History reviewed. No pertinent surgical history.    Expanded or extensive additional review of patient history:     Home Situation  Home Environment: Apartment(fifth floor, elevator access)  One/Two Story Residence:  One story  Living Alone: Yes  Support Systems: Friends \\ neighbors  Patient Expects to be Discharged to:: Skilled nursing facility  Current DME Used/Available at Home: None    Hand dominance: Right    EXAMINATION OF PERFORMANCE DEFICITS:  Cognitive/Behavioral Status:  Neurologic State: Alert  Orientation Level: Oriented to person;Oriented to place;Oriented to time  Cognition: Follows commands;Poor safety awareness    Hearing:  Auditory  Auditory Impairment: Hard of hearing, bilateral    Vision/Perceptual:     WFL      Range of Motion:  AROM: Generally decreased, functional  PROM: Generally decreased, functional    Strength:  Strength: Generally decreased, functional(Grossly 2+/5)    Coordination:  Fine Motor Skills-Upper: Comment(Grossly decreased, UE shakiness noted; per pt present at baseline).    Balance:  Sitting: Impaired;With support;High guard  Sitting - Static: Fair (occasional)  Sitting - Dynamic: Fair (occasional)  Standing: Impaired;Pull to stand;With support  Standing - Static: Fair;Constant support  Standing - Dynamic : Fair;Constant support    Functional Mobility and Transfers for ADLs:  Bed Mobility:  Rolling: Minimum assistance  Supine to Sit: Minimum assistance  Sit to Supine: Minimum assistance  Scooting: Minimum assistance    Transfers:  Sit to Stand: Moderate assistance;Assist x2  Stand to Sit: Moderate assistance;Assist x2    ADL Intervention and task modifications:  Lower Body Dressing Assistance  Socks: Total assistance (dependent)  Position Performed: Long sitting on bed    Therapeutic Exercise:  Pt educated on UE HEP to complete throughout the day to improve ROM and strength with good understanding verbalized and demonstrated.      Functional Measure:    KB Home	[place] AM-PACTM "6 Clicks"                                                       Daily Activity Inpatient Short Form  How much help from another person does the patient currently need... Total; A Lot A Little None   1.  Putting on  and taking off regular lower body clothing? [x]   1 []   2 []   3 []   4   2.  Bathing (including washing, rinsing, drying)? []   1 [x]   2 []   3 []   4   3.  Toileting, which includes using toilet, bedpan or urinal? [x]  1 []   2 []   3 []   4   4.  Putting on and taking off regular upper body clothing? []   1 [x]   2 []   3 []   4   5.  Taking care of personal grooming such as brushing teeth? []   1 []   2 [x]   3 []   4   6.  Eating meals? []   1 []   2 [x]   3 []   4   ?? 2007, [employer], under license to [employer]. All rights reserved     Score: 12/24     Interpretation of Tool:  Represents clinically-significant functional categories (i.e. Activities of daily  living).  Percentage of Impairment CH    0%   CI    1-19% CJ    20-39% CK    40-59% CL    60-79% CM    80-99% CN     100%   AMPAC  Score 6-24 24 23  20-22 15-19 10-14 7-9 6        Occupational Therapy Evaluation Charge Determination   History Examination Decision-Making   LOW Complexity : Brief history review  LOW Complexity : 1-3 performance deficits relating to physical, cognitive , or psychosocial skils that result in activity limitations and / or participation restrictions  MEDIUM Complexity : Patient may present with comorbidities that affect occupational performnce. Miniml to moderate modification of tasks or assistance (eg, physical or verbal ) with assesment(s) is necessary to enable patient to complete evaluation       Based on the above components, the patient evaluation is determined to be of the following complexity level: LOW   Pain Rating:  0/10    Activity Tolerance:   Fair and requires rest breaks  Please refer to the flowsheet for vital signs taken during this treatment.    After treatment patient left in no apparent distress:    Supine in bed, Heels elevated for pressure relief, Call bell within reach, and Side rails x 3    COMMUNICATION/EDUCATION:   The patient???s plan of care was discussed with: Physical therapist and Registered nurse.      Patient/family have participated as able in goal setting and plan of care. and Patient/family agree to work toward stated goals and plan of care.    This patient???s plan of care is appropriate for delegation to OTA.    OT/PT sessions occurred together for increased patient and clinician safety as pt with poor activity tolerance and requires A of 2 for mobility at this time    Thank you for this referral.  Lucrezia Europe  Time Calculation: 17 mins   Problem: Self Care Deficits Care Plan (Adult)  Goal: *Acute Goals and Plan of Care (Insert Text)  Description: Pt will be SBA sup <> sit in prep for EOB ADLs  Pt will be SBA grooming sitting EOB  Pt will be SBA LE dressing sitting EOB/long sit  Pt will be SBA sit <> stand in prep for toileting LRAD  Pt will be SBA toileting/toilet transfer/cloth mgmt LRAD  Pt will be IND following UE HEP in prep for self care tasks  Outcome: Not Met

## 2019-07-05 NOTE — Anesthesia Pre-Procedure Evaluation (Addendum)
Relevant Problems   RENAL FAILURE   (+) AKI (acute kidney injury) (HCC)   (+) Renal failure       Anesthetic History   No history of anesthetic complications            Review of Systems / Medical History  Patient summary reviewed, nursing notes reviewed and pertinent labs reviewed    Pulmonary    COPD      Smoker         Neuro/Psych             Comments: Patient possible syncopal episode, may have fallen with low blood pressure on admission Cardiovascular    Hypertension          Pacemaker      Comments: Patient is a very poor historian and his notes appear to be incomplete   GI/Hepatic/Renal         Renal disease: ARF       Endo/Other        Anemia     Other Findings          Past Medical History:   Diagnosis Date   ??? Prostate cancer [facility])        History reviewed. No pertinent surgical history.    No current outpatient medications    Current Facility-Administered Medications   Medication Dose Route Frequency   ??? 0.9% sodium chloride infusion  100 mL/hr IntraVENous CONTINUOUS   ??? tamsulosin (FLOMAX) capsule 0.4 mg  0.4 mg Oral QHS   ??? sodium bicarbonate tablet 650 mg  650 mg Oral BID   ??? NOREPINephrine (LEVOPHED) 8 mg in 5% dextrose (32 mcg/mL) infusion  0.5-30 mcg/min IntraVENous TITRATE   ??? meropenem (MERREM) 500 mg in sterile water (preservative free) 10 mL IV syringe  0.5 g IntraVENous Q12H   ??? insulin lispro (HUMALOG) injection   SubCUTAneous ACB&D   ??? glucose chewable tablet 16 g  4 Tab Oral PRN   ??? dextrose (D50W) injection syrg 12.5-25 g  25-50 mL IntraVENous PRN   ??? glucagon (GLUCAGEN) injection 1 mg  1 mg IntraMUSCular PRN   ??? sodium chloride (NS) flush 5-40 mL  5-40 mL IntraVENous Q8H   ??? sodium chloride (NS) flush 5-40 mL  5-40 mL IntraVENous PRN   ??? acetaminophen (TYLENOL) tablet 650 mg  650 mg Oral Q6H PRN    Or   ??? acetaminophen (TYLENOL) suppository 650 mg  650 mg Rectal Q6H PRN   ??? polyethylene glycol (MIRALAX) packet 17 g  17 g Oral DAILY PRN   ??? promethazine (PHENERGAN) tablet 12.5 mg  12.5  mg Oral Q6H PRN    Or   ??? ondansetron (ZOFRAN) injection 4 mg  4 mg IntraVENous Q6H PRN   ??? heparin (porcine) injection 5,000 Units  5,000 Units SubCUTAneous Q8H       Patient Vitals for the past 24 hrs:   Temp Pulse Resp BP SpO2   07/06/19 0600 ??? 71 17 93/72 100 %   07/06/19 0500 ??? 72 14 106/88 100 %   07/06/19 0400 ??? 71 14 (!) 85/69 100 %   07/06/19 0300 ??? 79 15 91/65 100 %   07/06/19 0200 ??? 86 18 106/76 100 %   07/06/19 0100 ??? 78 15 (!) 88/55 100 %   07/06/19 0013 ??? 79 14 97/65 100 %   07/05/19 2328 36.4 ??C (97.5 ??F) 78 13 100/63 100 %   07/05/19 2200 ??? 77 14 108/68 100 %  07/05/19 2132 ??? 77 13 95/78 100 %   07/05/19 2000 ??? 77 14 97/63 100 %   07/05/19 1935 36.3 ??C (97.4 ??F) 77 20 94/65 100 %   07/05/19 1802 ??? ??? 14 93/74 99 %   07/05/19 1700 ??? ??? 14 96/72 100 %   07/05/19 1600 ??? ??? 15 (!) 109/58 100 %   07/05/19 1559 ??? 93 ??? ??? ???   07/05/19 1500 36.4 ??C (97.5 ??F) ??? 16 (!) 88/60 100 %   07/05/19 1400 ??? ??? 18 100/72 100 %   07/05/19 1300 ??? ??? 15 (!) 90/47 100 %   07/05/19 1200 ??? ??? 15 100/66 100 %   07/05/19 1158 ??? 77 ??? ??? ???   07/05/19 1100 36.4 ??C (97.5 ??F) ??? 13 98/63 100 %   07/05/19 1000 ??? ??? 13 98/63 100 %   07/05/19 0900 ??? ??? 13 (!) 121/109 100 %   07/05/19 0800 ??? 76 15 105/76 100 %       Lab Results   Component Value Date/Time    WBC 5.4 07/05/2019 04:25 AM    WBC 5.2 07/05/2019 04:25 AM    HGB 9.8 (L) 07/05/2019 04:25 AM    HGB 9.8 (L) 07/05/2019 04:25 AM    HCT 30.3 (L) 07/05/2019 04:25 AM    HCT 29.7 (L) 07/05/2019 04:25 AM    PLATELET 209 07/05/2019 04:25 AM    PLATELET 202 07/05/2019 04:25 AM    MCV 92.7 07/05/2019 04:25 AM    MCV 91 07/05/2019 04:25 AM     Lab Results   Component Value Date/Time    Sodium 140 07/05/2019 04:25 AM    Sodium 141 07/05/2019 04:25 AM    Potassium 4.2 07/05/2019 04:25 AM    Potassium 4.2 07/05/2019 04:25 AM    Chloride 114 (H) 07/05/2019 04:25 AM    Chloride 114 (H) 07/05/2019 04:25 AM    CO2 21 07/05/2019 04:25 AM    CO2 20 (L) 07/05/2019 04:25 AM    Anion gap 5 07/05/2019 04:25 AM     Anion gap 7 07/05/2019 04:25 AM    Glucose 103 (H) 07/05/2019 04:25 AM    Glucose 103 (H) 07/05/2019 04:25 AM    BUN 81 (H) 07/05/2019 04:25 AM    BUN 81 (H) 07/05/2019 04:25 AM    Creatinine 3.55 (H) 07/05/2019 04:25 AM    Creatinine 3.56 (H) 07/05/2019 04:25 AM    BUN/Creatinine ratio 23 (H) 07/05/2019 04:25 AM    BUN/Creatinine ratio 23 (H) 07/05/2019 04:25 AM    GFR est AA 21 (L) 07/05/2019 04:25 AM    GFR est AA 21 (L) 07/05/2019 04:25 AM    GFR est non-AA 17 (L) 07/05/2019 04:25 AM    GFR est non-AA 17 (L) 07/05/2019 04:25 AM    Calcium 7.1 (L) 07/05/2019 04:25 AM    Calcium 7.1 (L) 07/05/2019 04:25 AM    Calcium 7.0 (L) 07/05/2019 04:25 AM     No results found for: APTT, PTP, INR, INREXT  Lab Results   Component Value Date/Time    Glucose 103 (H) 07/05/2019 04:25 AM    Glucose 103 (H) 07/05/2019 04:25 AM    Glucose (POC) 118 (H) 07/05/2019 03:18 PM     Physical Exam    Airway  Mallampati: I  TM Distance: 4 - 6 cm  Neck ROM: normal range of motion   Mouth opening: Normal     Cardiovascular  Regular rate and rhythm,  S1  and S2 normal,  no murmur, click, rub, or gallop            Comments: Patient is extremely cachectic severely malnourished very frail.  Initially on Levophed for extremely low blood pressure Dental    Dentition: Edentulous     Pulmonary  Breath sounds clear to auscultation               Abdominal  GI exam deferred       Other Findings            Anesthetic Plan    ASA: 4  Anesthesia type: general          Induction: Intravenous  Anesthetic plan and risks discussed with: Patient

## 2019-07-05 NOTE — Progress Notes (Signed)
General Daily Progress Note          Patient Name:   Wesley Clark       Date of Birth:   1955-12-17       Age:  64 y.o.      Admit Date: 07/03/2019      Subjective:       Patient alert awake denies any chest pain or shortness of breath still hypotensive on Levophed drip           Objective:     Visit Vitals  BP 110/67 (BP 1 Location: Left upper arm, BP Patient Position: At rest)   Pulse 76   Temp 97.6 ??F (36.4 ??C)   Resp 12   Ht 5' 8"  (1.727 m)   Wt 47.9 kg (105 lb 9.6 oz)   SpO2 100%   BMI 16.06 kg/m??        Recent Results (from the past 24 hour(s))   PROTEIN URINE, RANDOM    Collection Time: 07/04/19 12:50 PM   Result Value Ref Range    Protein, urine random 48 (H) 0.0 - 11.9 mg/dL   CREATININE, UR, RANDOM    Collection Time: 07/04/19 12:50 PM   Result Value Ref Range    Creatinine, urine 29.00 mg/dL   MRSA SCREEN - PCR (NASAL)    Collection Time: 07/04/19  3:43 PM   Result Value Ref Range    MRSA by PCR, Nasal Not Detected Not Detected     GLUCOSE, POC    Collection Time: 07/04/19  4:02 PM   Result Value Ref Range    Glucose (POC) 112 (H) 65 - 100 mg/dL    Performed by Stump Josie    GLUCOSE, POC    Collection Time: 07/04/19  9:33 PM   Result Value Ref Range    Glucose (POC) 139 (H) 65 - 100 mg/dL    Performed by Benn Moulder    RENAL FUNCTION PANEL    Collection Time: 07/05/19  4:25 AM   Result Value Ref Range    Sodium 140 136 - 145 mmol/L    Potassium 4.2 3.5 - 5.1 mmol/L    Chloride 114 (H) 97 - 108 mmol/L    CO2 21 21 - 32 mmol/L    Anion gap 5 5 - 15 mmol/L    Glucose 103 (H) 65 - 100 mg/dL    BUN 81 (H) 6 - 20 mg/dL    Creatinine 3.55 (H) 0.70 - 1.30 mg/dL    BUN/Creatinine ratio 23 (H) 12 - 20      GFR est AA 21 (L) >60 ml/min/1.60m    GFR est non-AA 17 (L) >60 ml/min/1.753m   Calcium 7.1 (L) 8.5 - 10.1 mg/dL    Phosphorus 3.4 2.6 - 4.7 mg/dL    Albumin 2.4 (L) 3.5 - 5.0 g/dL   CBC WITH AUTOMATED DIFF    Collection Time: 07/05/19  4:25 AM   Result Value Ref Range    WBC 5.4 4.1 - 11.1 K/uL    RBC 3.27 (L)  4.10 - 5.70 M/uL    HGB 9.8 (L) 12.1 - 17.0 g/dL    HCT 30.3 (L) 36.6 - 50.3 %    MCV 92.7 80.0 - 99.0 FL    MCH 30.0 26.0 - 34.0 PG    MCHC 32.3 30.0 - 36.5 g/dL    RDW 13.2 11.5 - 14.5 %    PLATELET 209 150 - 400 K/uL    MPV 10.2 8.9 -  12.9 FL    NRBC 0.0 0.0 PER 100 WBC    ABSOLUTE NRBC 0.00 0.00 - 0.01 K/uL    NEUTROPHILS 66 32 - 75 %    LYMPHOCYTES 18 12 - 49 %    MONOCYTES 8 5 - 13 %    EOSINOPHILS 7 0 - 7 %    BASOPHILS 1 0 - 1 %    IMMATURE GRANULOCYTES 0 0 - 0.5 %    ABS. NEUTROPHILS 3.6 1.8 - 8.0 K/UL    ABS. LYMPHOCYTES 1.0 0.8 - 3.5 K/UL    ABS. MONOCYTES 0.4 0.0 - 1.0 K/UL    ABS. EOSINOPHILS 0.4 0.0 - 0.4 K/UL    ABS. BASOPHILS 0.0 0.0 - 0.1 K/UL    ABS. IMM. GRANS. 0.0 0.00 - 0.04 K/UL    DF AUTOMATED     METABOLIC PANEL, COMPREHENSIVE    Collection Time: 07/05/19  4:25 AM   Result Value Ref Range    Sodium 141 136 - 145 mmol/L    Potassium 4.2 3.5 - 5.1 mmol/L    Chloride 114 (H) 97 - 108 mmol/L    CO2 20 (L) 21 - 32 mmol/L    Anion gap 7 5 - 15 mmol/L    Glucose 103 (H) 65 - 100 mg/dL    BUN 81 (H) 6 - 20 mg/dL    Creatinine 3.56 (H) 0.70 - 1.30 mg/dL    BUN/Creatinine ratio 23 (H) 12 - 20      GFR est AA 21 (L) >60 ml/min/1.15m    GFR est non-AA 17 (L) >60 ml/min/1.729m   Calcium 7.0 (L) 8.5 - 10.1 mg/dL    Bilirubin, total 0.2 0.2 - 1.0 mg/dL    AST (SGOT) 5 (L) 15 - 37 U/L    ALT (SGPT) 8 (L) 12 - 78 U/L    Alk. phosphatase 58 45 - 117 U/L    Protein, total 7.0 6.4 - 8.2 g/dL    Albumin 2.4 (L) 3.5 - 5.0 g/dL    Globulin 4.6 (H) 2.0 - 4.0 g/dL    A-G Ratio 0.5 (L) 1.1 - 2.2     C REACTIVE PROTEIN, QT    Collection Time: 07/05/19  4:25 AM   Result Value Ref Range    C-Reactive protein 2.01 (H) 0.00 - 0.60 mg/dL   PROCALCITONIN    Collection Time: 07/05/19  4:25 AM   Result Value Ref Range    Procalcitonin 0.34 (H) 0 ng/mL   GLUCOSE, POC    Collection Time: 07/05/19 11:14 AM   Result Value Ref Range    Glucose (POC) 130 (H) 65 - 100 mg/dL    Performed by CHTania Ade    @LABCOMPINFO @       Review of Systems    Constitutional: Negative for chills and fever.   HENT: Negative.    Eyes: Negative.    Respiratory: Negative.    Cardiovascular: Negative.    Gastrointestinal: Negative for abdominal pain and nausea.   Skin: Negative.    Neurological: Negative.        Physical Exam:      Constitutional: pt is oriented to person, place, and time.   HENT:   Head: Normocephalic and atraumatic.   Eyes: Pupils are equal, round, and reactive to light. EOM are normal.   Cardiovascular: Normal rate, regular rhythm and normal heart sounds.   Pulmonary/Chest: Breath sounds normal. No wheezes. No rales.   Exhibits no tenderness.   Abdominal: Soft.  Bowel sounds are normal. There is no abdominal tenderness. There is no rebound and no guarding.   Musculoskeletal: Normal range of motion.   Neurological: pt is alert and oriented to person, place, and time.     [place] RETROPERITONEUM COMP   Final Result   1. Bilateral moderate hydronephrosis, similar to previous.   2. Increased left renal echotexture from medical renal disease.      [place] RETROPERITONEUM COMP   Final Result   Bilateral hydronephrosis likely due to reflux from distended   bladder.       XR CHEST SNGL V   Final Result           Recent Results (from the past 24 hour(s))   PROTEIN URINE, RANDOM    Collection Time: 07/04/19 12:50 PM   Result Value Ref Range    Protein, urine random 48 (H) 0.0 - 11.9 mg/dL   CREATININE, UR, RANDOM    Collection Time: 07/04/19 12:50 PM   Result Value Ref Range    Creatinine, urine 29.00 mg/dL   MRSA SCREEN - PCR (NASAL)    Collection Time: 07/04/19  3:43 PM   Result Value Ref Range    MRSA by PCR, Nasal Not Detected Not Detected     GLUCOSE, POC    Collection Time: 07/04/19  4:02 PM   Result Value Ref Range    Glucose (POC) 112 (H) 65 - 100 mg/dL    Performed by Stump Josie    GLUCOSE, POC    Collection Time: 07/04/19  9:33 PM   Result Value Ref Range    Glucose (POC) 139 (H) 65 - 100 mg/dL    Performed by Benn Moulder    RENAL FUNCTION  PANEL    Collection Time: 07/05/19  4:25 AM   Result Value Ref Range    Sodium 140 136 - 145 mmol/L    Potassium 4.2 3.5 - 5.1 mmol/L    Chloride 114 (H) 97 - 108 mmol/L    CO2 21 21 - 32 mmol/L    Anion gap 5 5 - 15 mmol/L    Glucose 103 (H) 65 - 100 mg/dL    BUN 81 (H) 6 - 20 mg/dL    Creatinine 3.55 (H) 0.70 - 1.30 mg/dL    BUN/Creatinine ratio 23 (H) 12 - 20      GFR est AA 21 (L) >60 ml/min/1.59m    GFR est non-AA 17 (L) >60 ml/min/1.747m   Calcium 7.1 (L) 8.5 - 10.1 mg/dL    Phosphorus 3.4 2.6 - 4.7 mg/dL    Albumin 2.4 (L) 3.5 - 5.0 g/dL   CBC WITH AUTOMATED DIFF    Collection Time: 07/05/19  4:25 AM   Result Value Ref Range    WBC 5.4 4.1 - 11.1 K/uL    RBC 3.27 (L) 4.10 - 5.70 M/uL    HGB 9.8 (L) 12.1 - 17.0 g/dL    HCT 30.3 (L) 36.6 - 50.3 %    MCV 92.7 80.0 - 99.0 FL    MCH 30.0 26.0 - 34.0 PG    MCHC 32.3 30.0 - 36.5 g/dL    RDW 13.2 11.5 - 14.5 %    PLATELET 209 150 - 400 K/uL    MPV 10.2 8.9 - 12.9 FL    NRBC 0.0 0.0 PER 100 WBC    ABSOLUTE NRBC 0.00 0.00 - 0.01 K/uL    NEUTROPHILS 66 32 - 75 %    LYMPHOCYTES 18 12 - 49 %  MONOCYTES 8 5 - 13 %    EOSINOPHILS 7 0 - 7 %    BASOPHILS 1 0 - 1 %    IMMATURE GRANULOCYTES 0 0 - 0.5 %    ABS. NEUTROPHILS 3.6 1.8 - 8.0 K/UL    ABS. LYMPHOCYTES 1.0 0.8 - 3.5 K/UL    ABS. MONOCYTES 0.4 0.0 - 1.0 K/UL    ABS. EOSINOPHILS 0.4 0.0 - 0.4 K/UL    ABS. BASOPHILS 0.0 0.0 - 0.1 K/UL    ABS. IMM. GRANS. 0.0 0.00 - 0.04 K/UL    DF AUTOMATED     METABOLIC PANEL, COMPREHENSIVE    Collection Time: 07/05/19  4:25 AM   Result Value Ref Range    Sodium 141 136 - 145 mmol/L    Potassium 4.2 3.5 - 5.1 mmol/L    Chloride 114 (H) 97 - 108 mmol/L    CO2 20 (L) 21 - 32 mmol/L    Anion gap 7 5 - 15 mmol/L    Glucose 103 (H) 65 - 100 mg/dL    BUN 81 (H) 6 - 20 mg/dL    Creatinine 3.56 (H) 0.70 - 1.30 mg/dL    BUN/Creatinine ratio 23 (H) 12 - 20      GFR est AA 21 (L) >60 ml/min/1.40m    GFR est non-AA 17 (L) >60 ml/min/1.756m   Calcium 7.0 (L) 8.5 - 10.1 mg/dL    Bilirubin, total  0.2 0.2 - 1.0 mg/dL    AST (SGOT) 5 (L) 15 - 37 U/L    ALT (SGPT) 8 (L) 12 - 78 U/L    Alk. phosphatase 58 45 - 117 U/L    Protein, total 7.0 6.4 - 8.2 g/dL    Albumin 2.4 (L) 3.5 - 5.0 g/dL    Globulin 4.6 (H) 2.0 - 4.0 g/dL    A-G Ratio 0.5 (L) 1.1 - 2.2     C REACTIVE PROTEIN, QT    Collection Time: 07/05/19  4:25 AM   Result Value Ref Range    C-Reactive protein 2.01 (H) 0.00 - 0.60 mg/dL   PROCALCITONIN    Collection Time: 07/05/19  4:25 AM   Result Value Ref Range    Procalcitonin 0.34 (H) 0 ng/mL   GLUCOSE, POC    Collection Time: 07/05/19 11:14 AM   Result Value Ref Range    Glucose (POC) 130 (H) 65 - 100 mg/dL    Performed by CHTania Ade      Results     Procedure Component Value Units Date/Time    MRSA SCREEN - PCR (NASAL) [[id number]]ollected: 07/04/19 1543    Order Status: Completed Specimen: Swab Updated: 07/04/19 2203     MRSA by PCR, Nasal Not Detected       COVID-19 RAPID TEST [[id number]]ollected: 07/04/19 0630    Order Status: Completed Specimen: Nasopharyngeal Updated: 07/04/19 0738     Specimen source Nasopharyngeal        COVID-19 rapid test Not Detected        Comment: Rapid Abbott ID Now   Rapid NAAT:  The specimen is NEGATIVE for SARS-CoV-2, the novel coronavirus associated with COVID-19.   Negative results should be treated as presumptive and, if inconsistent with clinical signs and symptoms or necessary for patient management, should be tested with an alternative molecular assay. Negative results do not preclude SARS-CoV-2 infection and should not be used as the sole basis for patient management decisions.   This test has been authorized  by the FDA under   an Emergency Use Authorization (EUA) for use by authorized laboratories. Fact sheet for Healthcare Providers: LittleDVDs.dk Fact sheet for Patients: SatelliteRebate.it   Methodology: Isothermal Nucleic Acid Amplification         CULTURE, URINE [[id number]] Collected: 07/03/19  1615    Order Status: Completed Specimen: Urine Updated: 07/05/19 0838     Special Requests: --        No Special Requests  Reflexed from [id number]       Culture result: No Growth (<1000 cfu/mL)              Labs:     Recent Labs     07/05/19  0425 07/04/19  0345   WBC 5.4 7.9   HGB 9.8* 10.2*   HCT 30.3* 30.2*   PLT 209 243     Recent Labs     07/05/19  0425 07/04/19  0345 07/04/19  0005 07/03/19  1638   NA 141   140 134* 133* 133*   K 4.2   4.2 4.6 5.0 6.3*   CL 114*   114* 105 104 107   CO2 20*   21 20* 19* 15*   BUN 81*   81* 103* 109* 114*   CREA 3.56*   3.55* 5.28* 5.94* 6.84*   GLU 103*   103* 90 115* 113*   CA 7.0*   7.1* 8.1* 9.0 9.1   MG  --  2.0  --  2.6*   PHOS 3.4  --   --  6.6*     Recent Labs     07/05/19  0425 07/04/19  0345 07/03/19  1638   ALT 8* 9* 10*   AP 58 72 90   TBILI 0.2 0.5 0.6   TP 7.0 8.3* 10.3*   ALB 2.4*   2.4* 2.9* 3.7   GLOB 4.6* 5.4* 6.6*     No results for input(s): INR, PTP, APTT, INREXT in the last 72 hours.   No results for input(s): FE, TIBC, PSAT, FERR in the last 72 hours.   No results found for: FOL, RBCF   No results for input(s): PH, PCO2, PO2 in the last 72 hours.  Recent Labs     07/03/19  1638   TROIQ <0.05     No results found for: CHOL, CHOLX, CHLST, CHOLV, HDL, HDLP, LDL, LDLC, DLDLP, TGLX, TRIGL, TRIGP, CHHD, CHHDX  Lab Results   Component Value Date/Time    Glucose (POC) 130 (H) 07/05/2019 11:14 AM    Glucose (POC) 139 (H) 07/04/2019 09:33 PM    Glucose (POC) 112 (H) 07/04/2019 04:02 PM    Glucose (POC) 155 (H) 07/04/2019 08:19 AM     Lab Results   Component Value Date/Time    Color Yellow/Straw 07/03/2019 04:15 PM    Appearance Turbid (A) 07/03/2019 04:15 PM    Specific gravity 1.008 07/03/2019 04:15 PM    pH (UA) 6.0 07/03/2019 04:15 PM    Protein 100 (A) 07/03/2019 04:15 PM    Glucose 50 (A) 07/03/2019 04:15 PM    Ketone Negative 07/03/2019 04:15 PM    Bilirubin Negative 07/03/2019 04:15 PM    Urobilinogen 0.1 07/03/2019 04:15 PM    Nitrites Negative 07/03/2019  04:15 PM    Leukocyte Esterase Moderate (A) 07/03/2019 04:15 PM    Bacteria Negative 07/03/2019 04:15 PM    WBC >100 (H) 07/03/2019 04:15 PM    RBC >100 (H) 07/03/2019 04:15 PM  Assessment:     Acute kidney injury  Bilateral hydronephrosis  Hypotension  Urinary retention  Protein calorie malnutrition moderate  Electrolyte disturbances??-hyponatremia, hyperkalemia, hyperphosphatemia, hypermagnesemia  Decreased mobility  History of cardiac disease and pacemaker  History of high blood pressure  History of prostate cancer  Abnormal chest x-ray suggestive of emphysema??-patient does not currently smoke  Cachexia      Plan:     On IV meropenem  Subcu heparin 5000 every 8 hours  Flomax 0.4 daily  On Levophed drip    ID and renal consult  Will do urology consult        Current Facility-Administered Medications:   ???  0.9% sodium chloride infusion, 100 mL/hr, IntraVENous, CONTINUOUS, Brax Walen, MD, Last Rate: 100 mL/hr at 07/05/19 0044, 100 mL/hr at 07/05/19 0044  ???  tamsulosin (FLOMAX) capsule 0.4 mg, 0.4 mg, Oral, QHS, Gerhard Perches, MD, 0.4 mg at 07/04/19 2034  ???  sodium bicarbonate tablet 650 mg, 650 mg, Oral, BID, Gerhard Perches, MD, 650 mg at 07/05/19 0825  ???  NOREPINephrine (LEVOPHED) 8 mg in 5% dextrose 220m (32 mcg/mL) infusion, 0.5-30 mcg/min, IntraVENous, TITRATE, Aayan Haskew, MD, Last Rate: 3.8 mL/hr at 07/04/19 1849, 2 mcg/min at 07/04/19 1849  ???  meropenem (MERREM) 500 mg in sterile water (preservative free) 10 mL IV syringe, 0.5 g, IntraVENous, Q12H, HVallarie Mare MD, 500 mg at 07/05/19 0825  ???  insulin lispro (HUMALOG) injection, , SubCUTAneous, ACB&D, RDemetrios Loll NP, Stopped at 07/04/19 0730  ???  glucose chewable tablet 16 g, 4 Tab, Oral, PRN, RDemetrios Loll NP  ???  dextrose (D50W) injection syrg 12.5-25 g, 25-50 mL, IntraVENous, PRN, RDemetrios Loll NP  ???  glucagon (GLUCAGEN) injection 1 mg, 1 mg, IntraMUSCular, PRN, RDemetrios Loll NP  ???  sodium chloride (NS) flush 5-40  mL, 5-40 mL, IntraVENous, Q8H, RDemetrios Loll NP, 10 mL at 07/05/19 0600  ???  sodium chloride (NS) flush 5-40 mL, 5-40 mL, IntraVENous, PRN, RDemetrios Loll NP  ???  acetaminophen (TYLENOL) tablet 650 mg, 650 mg, Oral, Q6H PRN **OR** acetaminophen (TYLENOL) suppository 650 mg, 650 mg, Rectal, Q6H PRN, RDemetrios Loll NP  ???  polyethylene glycol (MIRALAX) packet 17 g, 17 g, Oral, DAILY PRN, RDemetrios Loll NP  ???  promethazine (PHENERGAN) tablet 12.5 mg, 12.5 mg, Oral, Q6H PRN **OR** ondansetron (ZOFRAN) injection 4 mg, 4 mg, IntraVENous, Q6H PRN, RDemetrios Loll NP  ???  heparin (porcine) injection 5,000 Units, 5,000 Units, SubCUTAneous, Q8H, RDemetrios Loll NP, 5,000 Units at 07/05/19 [phone]

## 2019-07-05 NOTE — Progress Notes (Signed)
Comprehensive Nutrition Assessment    Type and Reason for Visit: Initial(low BMI)    Nutrition Recommendations/Plan:     Continue Cardiac diet        Monitor need for Soft/Chopped restriction  Add Ensure Enlive TID  Add Ensure pdg daily   Allow additional snacks as desired    Update measured wts frequently  Document %meal and supplement intakes, BMs in I/Os    Nutrition Assessment:  Admitted for generalized weakness, SOB x 2-3 days pta. COVID 19 negative. CXR finding empysema/bullous cysts. US renal finding bilat hydronephrosis and distended bladder. Admitted to ICU d/t hypotension, requiring x1 pressor. Ordered Cardiac diet, intakes 50-75% per EMR. RD visualized 50% lunch intake (pork chop, vegetables, pudding). Pt reports not being ???picky??? at all, agreeable to all ONS. Denied specific preferences. RD spoke with CM on unit d/t pt verbalizing concerns for food insecurity. Labs: H/H: 9.8/30.3, BUN 81, Cr 3.55, BG 103, POC BG 130, Ca 7.1, AST 5, ALT 8. Meds: Norepi IV, IVF, heparin, SSI, meropenem, miralax PRN.    Malnutrition Assessment:  Malnutrition Status:  Severe malnutrition    Context:  Chronic illness     Findings of the 6 clinical characteristics of malnutrition:   Energy Intake:  7 - 75% or less est energy requirements for 1 month or longer(suspected d/t pt reporting limited intakes pta d/t difficulty obtaining food)  Weight Loss:  Unable to assess     Body Fat Loss:  1 - Mild body fat loss, Orbital, Fat overlying ribs, Triceps   Muscle Mass Loss:  7 - Severe muscle mass loss, Scapula (trapezius), Thigh (quadraceps), Temples (temporalis)  Fluid Accumulation:  No significant fluid accumulation,        Estimated Daily Nutrient Needs:  Energy (kcal): 1530kcal (32kcal/kg); Weight Used for Energy Requirements: Current  Protein (g): 58g (1.2g/kg); Weight Used for Protein Requirements: Current  Fluid (ml/day): ; Method Used for Fluid Requirements: 1 ml/kcal      Nutrition Related Findings:  NFPE +severe  muscle and fat wasting. ?Vitiligo. Poor dentition observed, however pt denied c/s difficulty. Denied n/v or c/d. BM documented 5/5, loose. No edema.      Wounds:    None       Current Nutrition Therapies:  DIET CARDIAC Regular  DIET NPO Except Meds  DIET NUTRITIONAL SUPPLEMENTS Breakfast, Lunch, Dinner; Ensure [employer]    Anthropometric Measures:  ?? Height:  5\' 8"  (172.7 cm)  ?? Current Body Wt:  47.9 kg (105 lb 9.6 oz)(5/5)   ?? Usual Body Wt:  49.9 kg (110 lb)(per pt - unsure when last weighed)     ?? Ideal Body Wt:  154 lbs:  68.6 %   ?? BMI Category:  Underweight (BMI less than 18.5)     No wt hx available per chart review.     Nutrition Diagnosis:   ?? Underweight related to inadequate protein-energy intake as evidenced by BMI, severe muscle loss      Nutrition Interventions:   Food and/or Nutrient Delivery: Continue current diet, Start oral nutrition supplement, Snacks (specify)  Nutrition Education and Counseling: No recommendations at this time  Coordination of Nutrition Care: Continue to monitor while inpatient, Coordination of community care(CM provided resources for Meals on Wheels)    Goals:  Meet >75% est needs via PO x 5-7 days, Wt gain 0.5kg/week, Lytes WNL, Maintain skin integrity       Nutrition Monitoring and Evaluation:   Behavioral-Environmental Outcomes: None identified  Food/Nutrient Intake Outcomes: Food and  nutrient intake, Supplement intake  Physical Signs/Symptoms Outcomes: Chewing or swallowing, Biochemical data, GI status, Weight, Nutrition focused physical findings    Discharge Planning:    Continue oral nutrition supplement, Assist with food insecurity     Electronically signed by Emmaline Life on 07/05/2019 at 3:05 PM    Contact: [phone] or via PerfectServe

## 2019-07-05 NOTE — Progress Notes (Addendum)
rcvd report from Nelva Bush, RN     19:35 Pt was resting in bed, watching tv.  Assessment completed. Pt has slight tremors of  Upper and lower extremities Pt stated he has been doing that for a "long time".  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                                                                                                                                              Pt was AOX4. VS are stable presently.      20:15 discussed plan of care and NPO after midnight.  Pt understood.  VS are stable no acute changes during rounding.      22:10n Anesthesia at bedside to get consent for cysto tomorrow AM.     23:00 pt watching tv, VS are stable no acute changes during rounding.    07/06/99    00:00 pt is now incontinent of stool. Loose with some form. Pt got frustrated and stated he wanted to die because he couldn't control his bowels any longer.  VS are stable no acute changes during rounding.    03:30 pt is resting comfortably.  He has had several loose stool episodes.  Pt does know when he needs to go, but is not able to hold it.      05:15 pt is sleeping in bed. VS are stable no acute changes during rounding.     Report given to Roxine Caddy, RN

## 2019-07-05 NOTE — Consults (Signed)
UROLOGY CONSULT NOTE    Dr. Vevelyn Royals, MD  Graceann Congress, APRN, FNP-C  [facility]  [phone]        Patient: Wesley Clark MRN: [id number]  SSN: [id number]    Date of Birth: 06-Nov-1955  Age: 65 y.o.  Sex: male      Subjective:      Wesley Clark is a 64 y.o. male who is being seen in urologic consultation secondary to bilateral hydronephrosis, hx of prostate cancer and UTI.    He was admitted through the ER on 07/04/19 with complaints of generalized weakness and fatigue with SOB x 2-3 days.    BNP was elevated.  UA showed pyuria without bacteria.    WBC was WNL.  Afebrile on admission, though severely hypotensive.    Initial [place] on 07/03/19 demonstrated bilateral hydronephrosis likely due to reflux from a distended bladder.  The bladder appeared to have sludge. Foley inserted (during exam).  Repeat US on 07/04/19 continued to show bilateral hydronephrosis and bladder decompression s/p foley placement.     Creatinine on admit was 6.84, now 3.56.    He is on Merem via Dr. Trudie Reed with a urine culture pending.      Past Medical History:   Diagnosis Date   ??? Prostate cancer [facility])      History reviewed. No pertinent surgical history.   History reviewed. No pertinent family history.  Social History     Tobacco Use   ??? Smoking status: Not on file   Substance Use Topics   ??? Alcohol use: Not on file      Current Facility-Administered Medications   Medication Dose Route Frequency Provider Last Rate Last Admin   ??? 0.9% sodium chloride infusion  100 mL/hr IntraVENous CONTINUOUS Mohiuddin, Abdul, MD 100 mL/hr at 07/05/19 1123 100 mL/hr at 07/05/19 1123   ??? tamsulosin (FLOMAX) capsule 0.4 mg  0.4 mg Oral QHS Gerhard Perches, MD   0.4 mg at 07/04/19 2034   ??? sodium bicarbonate tablet 650 mg  650 mg Oral BID Gerhard Perches, MD   650 mg at 07/05/19 0825   ??? NOREPINephrine (LEVOPHED) 8 mg in 5% dextrose 227m (32 mcg/mL) infusion  0.5-30 mcg/min IntraVENous TITRATE Mohiuddin, Abdul, MD 3.8 mL/hr at 07/04/19 1849 2 mcg/min at 07/04/19  1849   ??? meropenem (MERREM) 500 mg in sterile water (preservative free) 10 mL IV syringe  0.5 g IntraVENous Q12H HVallarie Mare MD   500 mg at 07/05/19 0825   ??? insulin lispro (HUMALOG) injection   SubCUTAneous ACB&D RDemetrios Loll NP   Stopped at 07/04/19 0730   ??? glucose chewable tablet 16 g  4 Tab Oral PRN RDemetrios Loll NP       ??? dextrose (D50W) injection syrg 12.5-25 g  25-50 mL IntraVENous PRN RDemetrios Loll NP       ??? glucagon (GLUCAGEN) injection 1 mg  1 mg IntraMUSCular PRN RDemetrios Loll NP       ??? sodium chloride (NS) flush 5-40 mL  5-40 mL IntraVENous Q8H RDemetrios Loll NP   10 mL at 07/05/19 0600   ??? sodium chloride (NS) flush 5-40 mL  5-40 mL IntraVENous PRN RDemetrios Loll NP       ??? acetaminophen (TYLENOL) tablet 650 mg  650 mg Oral Q6H PRN RDemetrios Loll NP        Or   ??? acetaminophen (TYLENOL) suppository 650 mg  650 mg Rectal Q6H PRN RDemetrios Loll NP       ???  polyethylene glycol (MIRALAX) packet 17 g  17 g Oral DAILY PRN Demetrios Loll, NP       ??? promethazine (PHENERGAN) tablet 12.5 mg  12.5 mg Oral Q6H PRN Demetrios Loll, NP        Or   ??? ondansetron [facility]) injection 4 mg  4 mg IntraVENous Q6H PRN Demetrios Loll, NP       ??? heparin (porcine) injection 5,000 Units  5,000 Units SubCUTAneous Q8H Demetrios Loll, NP   5,000 Units at 07/05/19 0550        Allergies   Allergen Reactions   ??? Penicillins Rash       Review of Systems:  ROS    Objective:     Vitals:    07/05/19 0700 07/05/19 0800 07/05/19 1100 07/05/19 1158   BP:       Pulse:  76  77   Resp:       Temp: 97.6 ??F (36.4 ??C)  97.5 ??F (36.4 ??C)    SpO2:       Weight:       Height:            Recent Labs     07/05/19  0425 07/04/19  0345 07/03/19  1638   WBC 5.4 7.9 9.4   HGB 9.8* 10.2* 14.4   HCT 30.3* 30.2* 41.8   PLT 209 243 266     Recent Labs     07/05/19  0425 07/04/19  0345 07/04/19  0005 07/03/19  1638   NA 141   140 134* 133* 133*   K 4.2   4.2 4.6 5.0 6.3*   CL 114*   114* 105 104 107   CO2 20*   21  20* 19* 15*   GLU 103*   103* 90 115* 113*   BUN 81*   81* 103* 109* 114*   CREA 3.56*   3.55* 5.28* 5.94* 6.84*   CA 7.1*   7.0*   7.1* 8.1* 9.0 9.1   MG  --  2.0  --  2.6*   PHOS 3.4  --   --  6.6*   ALB 2.4*   2.4* 2.9*  --  3.7   TBILI 0.2 0.5  --  0.6   ALT 8* 9*  --  10*     No results for input(s): PH, PCO2, PO2, HCO3, FIO2 in the last 72 hours.    24 Hour Results:  Recent Results (from the past 24 hour(s))   MRSA SCREEN - PCR (NASAL)    Collection Time: 07/04/19  3:43 PM   Result Value Ref Range    MRSA by PCR, Nasal Not Detected Not Detected     GLUCOSE, POC    Collection Time: 07/04/19  4:02 PM   Result Value Ref Range    Glucose (POC) 112 (H) 65 - 100 mg/dL    Performed by Stump Josie    GLUCOSE, POC    Collection Time: 07/04/19  9:33 PM   Result Value Ref Range    Glucose (POC) 139 (H) 65 - 100 mg/dL    Performed by Benn Moulder    RENAL FUNCTION PANEL    Collection Time: 07/05/19  4:25 AM   Result Value Ref Range    Sodium 140 136 - 145 mmol/L    Potassium 4.2 3.5 - 5.1 mmol/L    Chloride 114 (H) 97 - 108 mmol/L    CO2 21 21 - 32 mmol/L    Anion gap  5 5 - 15 mmol/L    Glucose 103 (H) 65 - 100 mg/dL    BUN 81 (H) 6 - 20 mg/dL    Creatinine 3.55 (H) 0.70 - 1.30 mg/dL    BUN/Creatinine ratio 23 (H) 12 - 20      GFR est AA 21 (L) >60 ml/min/1.74m    GFR est non-AA 17 (L) >60 ml/min/1.759m   Calcium 7.1 (L) 8.5 - 10.1 mg/dL    Phosphorus 3.4 2.6 - 4.7 mg/dL    Albumin 2.4 (L) 3.5 - 5.0 g/dL   PTH INTACT    Collection Time: 07/05/19  4:25 AM   Result Value Ref Range    Calcium 7.1 (L) 8.5 - 10.1 mg/dL    PTH, Intact PENDING pg/mL   CBC WITH AUTOMATED DIFF    Collection Time: 07/05/19  4:25 AM   Result Value Ref Range    WBC 5.4 4.1 - 11.1 K/uL    RBC 3.27 (L) 4.10 - 5.70 M/uL    HGB 9.8 (L) 12.1 - 17.0 g/dL    HCT 30.3 (L) 36.6 - 50.3 %    MCV 92.7 80.0 - 99.0 FL    MCH 30.0 26.0 - 34.0 PG    MCHC 32.3 30.0 - 36.5 g/dL    RDW 13.2 11.5 - 14.5 %    PLATELET 209 150 - 400 K/uL    MPV 10.2 8.9 - 12.9 FL     NRBC 0.0 0.0 PER 100 WBC    ABSOLUTE NRBC 0.00 0.00 - 0.01 K/uL    NEUTROPHILS 66 32 - 75 %    LYMPHOCYTES 18 12 - 49 %    MONOCYTES 8 5 - 13 %    EOSINOPHILS 7 0 - 7 %    BASOPHILS 1 0 - 1 %    IMMATURE GRANULOCYTES 0 0 - 0.5 %    ABS. NEUTROPHILS 3.6 1.8 - 8.0 K/UL    ABS. LYMPHOCYTES 1.0 0.8 - 3.5 K/UL    ABS. MONOCYTES 0.4 0.0 - 1.0 K/UL    ABS. EOSINOPHILS 0.4 0.0 - 0.4 K/UL    ABS. BASOPHILS 0.0 0.0 - 0.1 K/UL    ABS. IMM. GRANS. 0.0 0.00 - 0.04 K/UL    DF AUTOMATED     METABOLIC PANEL, COMPREHENSIVE    Collection Time: 07/05/19  4:25 AM   Result Value Ref Range    Sodium 141 136 - 145 mmol/L    Potassium 4.2 3.5 - 5.1 mmol/L    Chloride 114 (H) 97 - 108 mmol/L    CO2 20 (L) 21 - 32 mmol/L    Anion gap 7 5 - 15 mmol/L    Glucose 103 (H) 65 - 100 mg/dL    BUN 81 (H) 6 - 20 mg/dL    Creatinine 3.56 (H) 0.70 - 1.30 mg/dL    BUN/Creatinine ratio 23 (H) 12 - 20      GFR est AA 21 (L) >60 ml/min/1.7346m  GFR est non-AA 17 (L) >60 ml/min/1.56m72m Calcium 7.0 (L) 8.5 - 10.1 mg/dL    Bilirubin, total 0.2 0.2 - 1.0 mg/dL    AST (SGOT) 5 (L) 15 - 37 U/L    ALT (SGPT) 8 (L) 12 - 78 U/L    Alk. phosphatase 58 45 - 117 U/L    Protein, total 7.0 6.4 - 8.2 g/dL    Albumin 2.4 (L) 3.5 - 5.0 g/dL    Globulin 4.6 (H) 2.0 - 4.0  g/dL    A-G Ratio 0.5 (L) 1.1 - 2.2     C REACTIVE PROTEIN, QT    Collection Time: 07/05/19  4:25 AM   Result Value Ref Range    C-Reactive protein 2.01 (H) 0.00 - 0.60 mg/dL   PROCALCITONIN    Collection Time: 07/05/19  4:25 AM   Result Value Ref Range    Procalcitonin 0.34 (H) 0 ng/mL   HIV 1/2 AG/AB, 4TH GENERATION,W RFLX CONFIRM    Collection Time: 07/05/19  4:25 AM   Result Value Ref Range    HIV 1/2 Interpretation REACTIVE (A) NONREACTIVE    HIV 1/2 result comment SEE NOTE     GLUCOSE, POC    Collection Time: 07/05/19 11:14 AM   Result Value Ref Range    Glucose (POC) 130 (H) 65 - 100 mg/dL    Performed by Tania Ade        Physical Exam:  Physical Exam      Assessment:     Hospital  Problems  Never Reviewed          Codes Class Noted POA    Renal failure ICD-10-CM: N19  ICD-9-CM: 017  07/03/2019 Unknown        Hyperkalemia ICD-10-CM: E87.5  ICD-9-CM: 276.7  07/03/2019 Unknown        Electrolyte imbalance ICD-10-CM: E87.8  ICD-9-CM: 276.9  07/03/2019 Unknown        AKI (acute kidney injury) ([place]) ICD-10-CM: N17.9  ICD-9-CM: 584.9  07/03/2019 Unknown              Plan:     Hospital Problems  Never Reviewed          Codes Class Noted POA    Renal failure ICD-10-CM: N19  ICD-9-CM: 510  07/03/2019 Unknown        Hyperkalemia ICD-10-CM: E87.5  ICD-9-CM: 276.7  07/03/2019 Unknown        Electrolyte imbalance ICD-10-CM: E87.8  ICD-9-CM: 276.9  07/03/2019 Unknown        AKI (acute kidney injury) ([place]) ICD-10-CM: N17.9  ICD-9-CM: 584.9  07/03/2019 Unknown            BILATERAL HYDRONEPHROSIS:  [place] continued to demonstrate bilateral hydronephrosis despite catheterization.  Renal function is slowly improving.  Plan for cystoscopic evaluation, bilateral RPG and bilateral ureteral stent placement if necessary.  NPO at midnight.    AKI: admit creatinine was 6.84.  No knowledge of baseline renal function.  Now 3.56 following foley catheterization and bladder decompression.  Plan for further evaluation via cysto, bilateral RPG and possible stent placement if necessary.    UTI: marked pyuria on UA without bacteria.  Sludge noted on [place].  Culture pending, on Merem per ID.  Continue to follow.    HX OF PROSTATE CANCER: unknown disease status.  PSA pending.  Patient does not recall last treatment or intervention.    HIV INFECTION: possible HIV infection (see Dr. Trudie Reed' note).  Records being obtained.    Discussed with Dr. Vevelyn Royals, MD, Attending Physician    Signed By: Graceann Congress, NP     Jul 05, 2019

## 2019-07-05 NOTE — Progress Notes (Signed)
Reason for Admission:  weakness                     RUR Score:          17%           Plan for utilizing home health:      SNF    PCP: First and Last name:  None   Name of Practice:    Are you a current patient: Yes/No:    Approximate date of last visit:    Can you participate in a virtual visit with your PCP:                     Current Advanced Directive/Advance Care Plan: Full Code  Patient is not married, states he has no children. Patient states he has a brother and sister in [place], [place] but he does not know their number. Sister's name is Freda Munro. Brother's name is Legrand Como.                    Transition of Care Plan:                    CM met with patient at the bedside to discuss DCP assessment. Patient lives alone in an apartment. States he does not use any DME and is independent but "slow" when completing tasks. Patient is agreeable to SNF placement. No preference for facility. Patient state he will "take all the help he can get." CM also provided patient with an application for Meals on Wheels. Patient concerned about transportation as well, states he had Medicaid in [place]. Patient would like to be screened for Medicaid here. CM will contact ESS.

## 2019-07-05 NOTE — Other (Addendum)
Four eyes skin assessment:    Patient has skin breakdown over the sacrum area (poss pressure ulcer), Zinc paste applied to area. 3 peripheral IVs (20 LFA, 20 RFA, & 20 R upper arm) all patent, no complications. Several areas of hypopigmentation noted over patient's body. Patient's skin seems to be otherwise intact.

## 2019-07-05 NOTE — Progress Notes (Signed)
Problem: Mobility Impaired (Adult and Pediatric)  Goal: *Acute Goals and Plan of Care (Insert Text)  Description: Patient will move from supine to sit and sit to supine , scoot up and down, and roll side to side in bed with supervision/set-up within 7 day(s).    Patient will transfer from bed to chair and chair to bed with supervision/set-up using the least restrictive device within 7 day(s).    Patient will improve static standing balance to minimal assistance within 1 week(s).  Patient will ambulate 50 feet with minimal assistance with least restrictive device within 1 weeks.       Outcome: Not Met   PHYSICAL THERAPY EVALUATION  Patient: Wesley Clark (64 y.o. male)  Date: 07/05/2019  Primary Diagnosis: Renal failure [N19]  AKI (acute kidney injury) ([place]) [N17.9]  Hyperkalemia [E87.5]  Electrolyte imbalance [E87.8]  Procedure(s) (LRB):  CYSTOSCOPY BILATERAL RETROGRADE, BILATERAL URETERAL STENT PLACEMENT (Bilateral)     Precautions: fall  ASSESSMENT  As per ED note:Patient is a 64 y.o. year old male with past medical history of prostate cancer presented to the emergency department with generalized weakness and shortness of breath for the past 2 to 3 days.  He is a poor historian and unable to offer any further information on his medical history and he does not know the names of his "heart pill" and "blood pressure pill" that he stopped taken a long time ago.  He also does not recall the last time he had treatment for his prostate cancer.     Emergency department work-up revealed a CBC basically unremarkable but a chemistry with sodium of 133, hyperkalemia potassium of 6.3, phosphorus 6.6, magnesium 2.6 creatinine 6.84 and GFR 10.  There are no prior labs available for comparison.  His BNP is 1395.  Is a negative troponin.  He has received 1 full liter of sodium chloride IV fluid bolus and a second 1 is pending from the emergency department he also has received IV medications of calcium, dextrose, bicarb for his  potassium.  He will need a repeat chemistry after his second IV fluid liter has infused.  Foley catheter was inserted in the emergency department with foul-smelling urine returned.  Urinalysis and urine culture will be sent from the emergency department.  He is also receiving retroperitoneal ultrasound.  They will scan the bladder post Foley insertion as well during the ultrasound.     Patient found in bed supine, RN okayed to see the patient for eval.Patient agreeable to work with PT/OT eval/treat.  Lives at home by himself. Patient required mod assist for supine to sit ,Patient very shaky sitting at eob.mod assist X 2  for sit to stand. Able to take few side steps to the [facility] with RW and gait belt. Fatigued quickly.return to supine with min to mod assist.patient with decreased gen strength,impaired safety and inability to walk at this time.he will most likely need assistance 24 x 7 or placement upon discharge as he is unable to care for himself at this time.          PLAN :  Recommendations and Planned Interventions: bed mobility training, transfer training, gait training, therapeutic exercises, patient and family training/education and therapeutic activities      Frequency/Duration: Patient will be followed by physical therapy:  5 times a week to address goals.    Recommendation for discharge: (in order for the patient to meet his/her long term goals)  Therapy up to 5 days/week in SNF setting    This  discharge recommendation:  Has been made in collaboration with the attending provider and/or case management    IF patient discharges home will need the following DME: to be determined (TBD)         SUBJECTIVE:   Patient stated ???i am better since I came here.Marland Kitchen???    OBJECTIVE DATA SUMMARY:   HISTORY:    Past Medical History:   Diagnosis Date   ??? Prostate cancer [facility])    History reviewed. No pertinent surgical history.    Personal factors and/or comorbidities impacting plan of care:   Home Situation  Home Environment:  Apartment  One/Two Story Residence: Other (Comment)(lives on 5th floor with elevator)  Living Alone: Yes  Support Systems: Friends \\ neighbors  Patient Expects to be Discharged to:: Skilled nursing facility  Current DME Used/Available at Home: Other (comment)(unknown)    EXAMINATION/PRESENTATION/DECISION MAKING:   Critical Behavior:  Neurologic State: Alert  Orientation Level: Oriented to person, Oriented to place, Oriented to situation  Cognition: Poor safety awareness     Hearing:  Auditory  Auditory Impairment: Hard of hearing, bilateral  Range Of Motion:  AROM: Generally decreased, functional           PROM: Generally decreased, functional           Strength:    Strength: Generally decreased, functional                    Tone & Sensation:          apparent wasting bil thigh ms                             Functional Mobility:  Bed Mobility:  Rolling: Minimum assistance  Supine to Sit: Minimum assistance  Sit to Supine: Minimum assistance  Scooting: Minimum assistance  Transfers:  Sit to Stand: Moderate assistance;Maximum assistance;Assist x2  Stand to Sit: Moderate assistance;Assist x2                       Balance:   Sitting: Impaired;With support;High guard  Sitting - Static: Fair (occasional)  Sitting - Dynamic: Fair (occasional)  Standing: Impaired;Pull to stand;With support  Standing - Static: Fair;Constant support  Standing - Dynamic : Fair;Constant support  Ambulation/Gait Training:              Gait Description (WDL): Exceptions to WDL                                             Functional Measure:  [employer]??? ???6 Clicks???         Basic Mobility Inpatient Short Form  How much difficulty does the patient currently have... Unable A Lot A Little None   1.  Turning over in bed (including adjusting bedclothes, sheets and blankets)?   []  1   []  2   [x]  3   []  4   2.  Sitting down on and standing up from a chair with arms ( e.g., wheelchair, bedside commode, etc.)   []  1   [x]  2   []  3   []  4   3.   Moving from lying on back to sitting on the side of the bed?   []  1   [x]  2   []  3   []  4  How much help from another person does the patient currently need... Total A Lot A Little None   4.  Moving to and from a bed to a chair (including a wheelchair)?   []  1   [x]  2   []  3   []  4   5.  Need to walk in hospital room?   []  1   [x]  2   []  3   []  4   6.  Climbing 3-5 steps with a railing?   []  1   [x]  2   []  3   []  4   ?? 2007, Trustees of [place], under license to [place]. All rights reserved     Score:  Initial: 13/24 Most Recent: X (Date: 07/05/2019 )   Interpretation of Tool:  Represents activities that are increasingly more difficult (i.e. Bed mobility, Transfers, Gait).  Score 24 23 22-20 19-15 14-10 9-7 6   Modifier CH CI CJ CK CL CM CN            Physical Therapy Evaluation Charge Determination   History Examination Presentation Decision-Making   LOW Complexity : Zero comorbidities / personal factors that will impact the outcome / POC MEDIUM Complexity : 3 Standardized tests and measures addressing body structure, function, activity limitation and / or participation in recreation  MEDIUM Complexity : Evolving with changing characteristics  MEDIUM Complexity : FOTO score of 26-74      Based on the above components, the patient evaluation is determined to be of the following complexity level: MEDIUM    Pain Rating:  0/10    Activity Tolerance:   Fair  Please refer to the flowsheet for vital signs taken during this treatment.    After treatment patient left in no apparent distress:   Supine in bed, Call bell within reach and Bed / chair alarm activated    COMMUNICATION/EDUCATION:   The patient???s plan of care was discussed with: Occupational therapist and Registered nurse.     Fall prevention education was provided and the patient/caregiver indicated understanding., Patient/family have participated as able in goal setting and plan of care. and Patient/family agree to work toward stated goals  and plan of care.    Thank you for this referral.  Alonna Buckler PT   Time Calculation: 20 mins

## 2019-07-05 NOTE — Progress Notes (Signed)
[facility]     Renal Daily Progress Note:     Admission Date: 07/03/2019     Subjective: feels better, BP remains low but he is asymptomatic, eating. No N/V or abd pain, foley in, urine output 1 liter      Review of Systems  Pertinent items are noted in HPI.    Objective:     Visit Vitals  BP 110/67 (BP 1 Location: Left upper arm, BP Patient Position: At rest)   Pulse 76   Temp 97.5 ??F (36.4 ??C)   Resp 12   Ht 5\' 8"  (1.727 m)   Wt 47.9 kg (105 lb 9.6 oz)   SpO2 100%   BMI 16.06 kg/m??     Temp (24hrs), Avg:97.6 ??F (36.4 ??C), Min:97.5 ??F (36.4 ??C), Max:97.9 ??F (36.6 ??C)        Intake/Output Summary (Last 24 hours) at 07/05/2019 1152  Last data filed at 07/05/2019 0540  Gross per 24 hour   Intake 2264.49 ml   Output 1000 ml   Net 1264.49 ml     Current Facility-Administered Medications   Medication Dose Route Frequency   ??? 0.9% sodium chloride infusion  100 mL/hr IntraVENous CONTINUOUS   ??? tamsulosin (FLOMAX) capsule 0.4 mg  0.4 mg Oral QHS   ??? sodium bicarbonate tablet 650 mg  650 mg Oral BID   ??? NOREPINephrine (LEVOPHED) 8 mg in 5% dextrose 09/04/2019 (32 mcg/mL) infusion  0.5-30 mcg/min IntraVENous TITRATE   ??? meropenem (MERREM) 500 mg in sterile water (preservative free) 10 mL IV syringe  0.5 g IntraVENous Q12H   ??? insulin lispro (HUMALOG) injection   SubCUTAneous ACB&D   ??? glucose chewable tablet 16 g  4 Tab Oral PRN   ??? dextrose (D50W) injection syrg 12.5-25 g  25-50 mL IntraVENous PRN   ??? glucagon (GLUCAGEN) injection 1 mg  1 mg IntraMUSCular PRN   ??? sodium chloride (NS) flush 5-40 mL  5-40 mL IntraVENous Q8H   ??? sodium chloride (NS) flush 5-40 mL  5-40 mL IntraVENous PRN   ??? acetaminophen (TYLENOL) tablet 650 mg  650 mg Oral Q6H PRN    Or   ??? acetaminophen (TYLENOL) suppository 650 mg  650 mg Rectal Q6H PRN   ??? polyethylene glycol (MIRALAX) packet 17 g  17 g Oral DAILY PRN   ??? promethazine (PHENERGAN) tablet 12.5 mg  12.5 mg Oral Q6H PRN    Or   ??? ondansetron (ZOFRAN) injection 4 mg  4 mg IntraVENous Q6H PRN   ???  heparin (porcine) injection 5,000 Units  5,000 Units SubCUTAneous Q8H       Physical Exam:General appearance: alert, cooperative, no distress, appears older than stated age  Lungs: clear to auscultation bilaterally  Heart: regular rate and rhythm, no S3 or S4  Abdomen: soft, non-tender. Bowel sounds normal. No masses,  no organomegaly  Extremities: no edema  Neurologic: Grossly normal    Data Review:     LABS:  Recent Labs     07/05/19  0425 07/04/19  0345 07/04/19  0005 07/03/19  1638   NA 141   140 134* 133* 133*   K 4.2   4.2 4.6 5.0 6.3*   CL 114*   114* 105 104 107   CO2 20*   21 20* 19* 15*   BUN 81*   81* 103* 109* 114*   CREA 3.56*   3.55* 5.28* 5.94* 6.84*   CA 7.1*   7.0*   7.1* 8.1* 9.0  9.1   ALB 2.4*   2.4* 2.9*  --  3.7   PHOS 3.4  --   --  6.6*   MG  --  2.0  --  2.6*     Recent Labs     07/05/19  0425 07/04/19  0345 07/03/19  1638   WBC 5.4 7.9 9.4   HGB 9.8* 10.2* 14.4   HCT 30.3* 30.2* 41.8   PLT 209 243 266     Recent Labs     07/04/19  1250 07/03/19  2015   NAU  --  79   KU  --  21   CREAU 29.00  --      Retroperitoneal ultrasound Jul 03, 2019:  History: Renal failure  ??  Retroperitoneal ultrasound was performed. Aorta and IVC appear within normal  limits. Right kidney measures 9.4 x 4.8 x 5.5 cm. There is hydronephrosis. Left  kidney measures 9.3 x 5.1 x 5.0 cm. There is hydronephrosis. The bladder is  distended with a sludge fluid level within it. It measures 8.7 x 5.2 x 5.3 cm.  Foley catheter was placed within the bladder during the examination. There is  some decompression of the bladder. Reevaluation of the kidneys demonstrated  hydronephrosis. It is not indicated how long before reevaluation post Foley.  ??  IMPRESSION  Bilateral hydronephrosis likely due to reflux from distended  bladder.   ??  CXR 07-03-19:  Chest single view.  ??  Emphysema/bullous cysts. Small volume linear reticular markings left lung base.  No gross interstitial or alveolar pulmonary edema. Left side cardiac device with   leads overlying the heart. No pneumothorax or sizable pleural effusion.  Assessment:   Renal Specific Problems  Probable mild chronic kidney disease at baseline  Acute kidney injury related to obstructive uropathy  Bladder outlet obstruction with secondary hydronephrosis  Hyperkalemia related to AKI- resolved  Anemia probably related to renal insufficiency, rule out iron deficiency  Metabolic acidosis  Hyperphosphatemia  Hypoalbuminemia  Unexplained weight loss  History of prostate cancer???treatment protocol not clear  Hypotension  ??        Plan:     Obtain/ Order: labs/cultures/radiology/procedures:  daiily Renal fxn, awaiting PSA    Iron sat    Therapeutic:    Cont IVF but change to 0.45NS in AM  Oral NaHCO3        Gardiner Coins, MD    [phone]

## 2019-07-05 NOTE — Progress Notes (Signed)
Spiritual Care Assessment/Progress Note  [facility]      NAME: Wesley Clark      MRN: [id number]  AGE: 64 y.o. SEX: male  Religious Affiliation: No preference   Language: English     07/05/2019     Total Time (in minutes): 20     Spiritual Assessment begun in [facility] CCU through conversation with:         [x] Patient        []  Family    []  Friend(s)        Reason for Consult: Request by staff, Initial/Spiritual assessment, critical care     Spiritual beliefs: (Please include comment if needed)     []  Identifies with a faith tradition:         []  Supported by a faith community:            []  Claims no spiritual orientation:           []  Seeking spiritual identity:                []  Adheres to an individual form of spirituality:           [x]  Not able to assess:                           Identified resources for coping:      [x]  Prayer                               []  Music                  []  Guided Imagery     []  Family/friends                 []  Pet visits     []  Devotional reading                         []  Unknown     []  Other:                                              Interventions offered during this visit: (See comments for more details)    Patient Interventions: Affirmation of faith, Affirmation of emotions/emotional suffering, Coping skills reviewed/reinforced, Religious beliefs/image of God discussed, Prayer (actual), Normalization of emotional/spiritual concerns           Plan of Care:     []  Support spiritual and/or cultural needs    []  Support AMD and/or advance care planning process      []  Support grieving process   []  Coordinate Rites and/or Rituals    []  Coordination with community clergy   []  No spiritual needs identified at this time   []  Detailed Plan of Care below (See Comments)  []  Make referral to Music Therapy  []  Make referral to Pet Therapy     []  Make referral to Addiction services  []  Make referral to [place]  []  Make referral to Spiritual Care Partner  []  No  future visits requested        [x]  Follow up upon further referrals     Comments: Chaplain visited patient in ICU 282 in response to referral from case management. Chaplain  and patient engaged in reflective conversation regarding patient's change of life choices due to faith and conversion. Chaplain and patient explored coping skills (patient's coping is one day at a time and based on assurance of his faith), hopefulness, and patient's indication that he is on the stairway to heaven. Chaplain and patient also explored the idea of discussing an Designer, fashion/clothing. Patient currently lists a friend from church as a contact person however there are no AMD documents. Patient expressed interest in AMD consult. Chaplain provided prayer as requested, advised patient and staff of chaplains availability for follow up visits upon further referrals.       Visited by Marvis Moeller Spreacker, BCC, MACM   Chaplain can be contacted by calling operator and requesting chaplain on call

## 2019-07-05 NOTE — Progress Notes (Signed)
Progress Note    Patient: Wesley Clark MRN: [id number]  SSN: [id number]    Date of Birth: 1955-09-18  Age: 64 y.o.  Sex: male      Admit Date: 07/03/2019    LOS: 2 days     Subjective:   Patient followed for suspected complicated UTI and HIV-1 infection but urine culture is negative.  He remains afebrile with normal WBC but elevated procal and CRP.  He is currently on IV Meropenem.  Also seen by Urology.  Patient awake and responsive but again denies any knowledge of having HIV infection.  Objective:     Vitals:    07/05/19 0534 07/05/19 0600 07/05/19 0700 07/05/19 0800   BP: (!) 106/58 110/67     Pulse: 76 76  76   Resp: 12 12     Temp:   97.6 ??F (36.4 ??C)    SpO2: 100% 100%     Weight:       Height:            Intake and Output:  Current Shift: No intake/output data recorded.  Last three shifts: 05/03 1901 - 05/05 0700  In: 2864.5 [P.O.:1080; I.V.:1784.5]  Out: 3100 [Urine:3100]    Physical Exam:   Vitals signs (Extremely hypertensive) and nursing note reviewed.   Constitutional:       General: He is not in acute distress.     Appearance: Normal appearance. He is ill-appearing.   HENT:      Head: Normocephalic and atraumatic.      Right Ear: External ear normal.      Left Ear: External ear normal.      Nose: Nose normal.      Mouth/Throat:      Pharynx: Oropharynx is clear.      Comments: Multiple missing teeth  Eyes:      Pupils: Pupils are equal, round, and reactive to light.   Neck:      Musculoskeletal: Neck supple.   Cardiovascular:      Rate and Rhythm: Regular rhythm. Tachycardia present.      Heart sounds: Normal heart sounds. No murmur.   Pulmonary:      Breath sounds: No wheezing, rhonchi or rales.   Abdominal:      General: Abdomen is flat. Bowel sounds are normal.      Palpations: Abdomen is soft.      Tenderness: There is no abdominal tenderness.   Genitourinary:     Comments: Foley catheter  Musculoskeletal:      Right lower leg: No edema.      Left lower leg: No edema.   Skin:     Findings: No  lesion or rash.   Neurological:      General: No focal deficit present.      Mental Status: He is alert. He is disoriented.   Psychiatric:         Mood and Affect: Mood normal.         Behavior: Behavior normal.         Thought Content: Thought content normal.      Lab/Data Review:     WBC 5,400    Procal 0.34  CRP 2.01    Urine culture (5/3) No growth    Assessment:     Active Problems:    Renal failure (07/03/2019)      Hyperkalemia (07/03/2019)      Electrolyte imbalance (07/03/2019)      AKI (acute kidney injury) (HCC) (07/03/2019)  1. Possible complicated UTI (bilateral hydronephrosis/distended bladder), with marked pyuria, urine culture negative.  2. HIV-1 infection, confirmed, staging labs ordered  3. Hypotension, etiology unclear, ?sepsis  4. Renal failure  5. Penicillin allergy  ??  Comment:  Strong case for UTI, but urine culture negative.  False negative culture is possible.   Plan:   1. Continue IV Meropenem  2. In am, repeat procal and CRP  3. Repeat urinalysis in 72 hours  4. Follow-up HIV-1 Ab, HIV-1 RNA and CD4 count  5. Spoke to Dr. Richardson Landry, his PCP at [facility] and confirmed that he has HIV and is followed by Dr. Ricarda Frame, [facility]; apparently patient has had social challenges and was no show for Feb 2021 appointment.  Will try to reach out Dr. Rayburn Ma       Signed By: Lorina Rabon, MD     Jul 05, 2019

## 2019-07-06 ENCOUNTER — Inpatient Hospital Stay: Admit: 2019-07-06 | Payer: MEDICARE | Primary: Internal Medicine

## 2019-07-06 ENCOUNTER — Inpatient Hospital Stay: Payer: MEDICARE | Primary: Internal Medicine

## 2019-07-06 LAB — LYMPHOCYTES, CD4 PERCENT AND ABSOLUTE
% CD 4 Pos Lymph: 16.8 % — ABNORMAL LOW (ref 30.8–58.5)
ABS. BASOPHILS: 0 10*3/uL (ref 0.0–0.2)
ABS. EOSINOPHILS: 0.4 10*3/uL (ref 0.0–0.4)
ABS. IMM. GRANS.: 0 10*3/uL (ref 0.0–0.1)
ABS. MONOCYTES: 0.4 10*3/uL (ref 0.1–0.9)
ABS. NEUTROPHILS: 3.5 10*3/uL (ref 1.4–7.0)
Abs CD4 Helper: 151 /uL — ABNORMAL LOW (ref 359–1519)
Abs Lymphocytes: 0.9 10*3/uL (ref 0.7–3.1)
BASOPHILS: 1 %
EOSINOPHILS: 7 %
HCT: 29.7 % — ABNORMAL LOW (ref 37.5–51.0)
HGB: 9.8 g/dL — ABNORMAL LOW (ref 13.0–17.7)
IMMATURE GRANULOCYTES: 1 %
Lymphocytes: 18 %
MCH: 30.2 pg (ref 26.6–33.0)
MCHC: 33 g/dL (ref 31.5–35.7)
MCV: 91 fL (ref 79–97)
MONOCYTES: 7 %
NEUTROPHILS: 66 %
PLATELET: 202 10*3/uL (ref 150–450)
RBC: 3.25 x10E6/uL — ABNORMAL LOW (ref 4.14–5.80)
RDW: 14.2 % (ref 11.6–15.4)
WBC: 5.2 10*3/uL (ref 3.4–10.8)

## 2019-07-06 LAB — HIV-1 RNA BY PCR, QT
HIV-1 RNA by PCR: 90000 copies/mL
HIV-1 RNA log10 Copies/mL: 4.954 log10copy/mL

## 2019-07-06 LAB — CBC WITH AUTOMATED DIFF
ABS. BASOPHILS: 0 10*3/uL (ref 0.0–0.1)
ABS. EOSINOPHILS: 0.5 10*3/uL — ABNORMAL HIGH (ref 0.0–0.4)
ABS. IMM. GRANS.: 0 10*3/uL (ref 0.00–0.04)
ABS. LYMPHOCYTES: 1.1 10*3/uL (ref 0.8–3.5)
ABS. MONOCYTES: 0.6 10*3/uL (ref 0.0–1.0)
ABS. NEUTROPHILS: 3.7 10*3/uL (ref 1.8–8.0)
ABSOLUTE NRBC: 0 10*3/uL (ref 0.00–0.01)
BASOPHILS: 1 % (ref 0–1)
EOSINOPHILS: 9 % — ABNORMAL HIGH (ref 0–7)
HCT: 30.5 % — ABNORMAL LOW (ref 36.6–50.3)
HGB: 9.7 g/dL — ABNORMAL LOW (ref 12.1–17.0)
IMMATURE GRANULOCYTES: 0 % (ref 0–0.5)
LYMPHOCYTES: 18 % (ref 12–49)
MCH: 29.8 PG (ref 26.0–34.0)
MCHC: 31.8 g/dL (ref 30.0–36.5)
MCV: 93.8 FL (ref 80.0–99.0)
MONOCYTES: 9 % (ref 5–13)
MPV: 10.3 FL (ref 8.9–12.9)
NEUTROPHILS: 63 % (ref 32–75)
NRBC: 0 PER 100 WBC
PLATELET: 224 10*3/uL (ref 150–400)
RBC: 3.25 M/uL — ABNORMAL LOW (ref 4.10–5.70)
RDW: 13.5 % (ref 11.5–14.5)
WBC: 5.9 10*3/uL (ref 4.1–11.1)

## 2019-07-06 LAB — METABOLIC PANEL, COMPREHENSIVE
A-G Ratio: 0.5 — ABNORMAL LOW (ref 1.1–2.2)
ALT (SGPT): 20 U/L (ref 12–78)
AST (SGOT): 17 U/L (ref 15–37)
Albumin: 2.4 g/dL — ABNORMAL LOW (ref 3.5–5.0)
Alk. phosphatase: 69 U/L (ref 45–117)
Anion gap: 3 mmol/L — ABNORMAL LOW (ref 5–15)
BUN/Creatinine ratio: 25 — ABNORMAL HIGH (ref 12–20)
BUN: 59 mg/dL — ABNORMAL HIGH (ref 6–20)
Bilirubin, total: 0.3 mg/dL (ref 0.2–1.0)
CO2: 25 mmol/L (ref 21–32)
Calcium: 7.6 mg/dL — ABNORMAL LOW (ref 8.5–10.1)
Chloride: 115 mmol/L — ABNORMAL HIGH (ref 97–108)
Creatinine: 2.39 mg/dL — ABNORMAL HIGH (ref 0.70–1.30)
GFR est AA: 33 mL/min/{1.73_m2} — ABNORMAL LOW (ref >60)
GFR est non-AA: 28 mL/min/{1.73_m2} — ABNORMAL LOW (ref >60)
Globulin: 4.5 g/dL — ABNORMAL HIGH (ref 2.0–4.0)
Glucose: 84 mg/dL (ref 65–100)
Potassium: 5.7 mmol/L — ABNORMAL HIGH (ref 3.5–5.1)
Protein, total: 6.9 g/dL (ref 6.4–8.2)
Sodium: 143 mmol/L (ref 136–145)

## 2019-07-06 LAB — GLUCOSE, POC: Glucose (POC): 167 mg/dL — ABNORMAL HIGH (ref 65–100)

## 2019-07-06 LAB — PROSTATE-SPECIFIC AG: Prostate Specific Ag: 2.2 ng/mL (ref 0.01–4.0)

## 2019-07-06 LAB — PROCALCITONIN: Procalcitonin: 0.3 ng/mL — ABNORMAL HIGH

## 2019-07-06 LAB — C REACTIVE PROTEIN, QT: C-Reactive protein: 1.57 mg/dL — ABNORMAL HIGH (ref 0.00–0.60)

## 2019-07-06 MED ORDER — IOPAMIDOL 76 % IV SOLN
76 % | INTRAVENOUS | Status: AC
Start: 2019-07-06 — End: ?

## 2019-07-06 MED ORDER — DEXAMETHASONE SODIUM PHOSPHATE 4 MG/ML IJ SOLN
4 mg/mL | INTRAMUSCULAR | Status: AC
Start: 2019-07-06 — End: ?

## 2019-07-06 MED ORDER — SODIUM ZIRCONIUM CYCLOSILICATE 10 GRAM ORAL POWDER PACKET
10 gram | ORAL | Status: AC
Start: 2019-07-06 — End: 2019-07-06
  Administered 2019-07-06: 18:00:00 via ORAL

## 2019-07-06 MED ORDER — SODIUM CHLORIDE 0.45 % IV
0.45 % | INTRAVENOUS | Status: DC
Start: 2019-07-06 — End: 2019-07-10
  Administered 2019-07-06 – 2019-07-09 (×6): via INTRAVENOUS

## 2019-07-06 MED ORDER — ONDANSETRON (PF) 4 MG/2 ML INJECTION
4 mg/2 mL | INTRAMUSCULAR | Status: AC
Start: 2019-07-06 — End: ?

## 2019-07-06 MED ORDER — DEXAMETHASONE SODIUM PHOSPHATE 4 MG/ML IJ SOLN
4 mg/mL | INTRAMUSCULAR | Status: DC | PRN
Start: 2019-07-06 — End: 2019-07-06
  Administered 2019-07-06 (×2): via INTRAVENOUS

## 2019-07-06 MED ORDER — PROPOFOL 10 MG/ML IV EMUL
10 mg/mL | INTRAVENOUS | Status: AC
Start: 2019-07-06 — End: ?

## 2019-07-06 MED ORDER — ONDANSETRON (PF) 4 MG/2 ML INJECTION
4 mg/2 mL | INTRAMUSCULAR | Status: DC | PRN
Start: 2019-07-06 — End: 2019-07-06
  Administered 2019-07-06: 13:00:00 via INTRAVENOUS

## 2019-07-06 MED ORDER — PROPOFOL 10 MG/ML IV EMUL
10 mg/mL | INTRAVENOUS | Status: DC | PRN
Start: 2019-07-06 — End: 2019-07-06
  Administered 2019-07-06 (×2): via INTRAVENOUS

## 2019-07-06 MED ORDER — FENTANYL CITRATE (PF) 50 MCG/ML IJ SOLN
50 mcg/mL | INTRAMUSCULAR | Status: AC
Start: 2019-07-06 — End: ?

## 2019-07-06 MED ORDER — FENTANYL CITRATE (PF) 50 MCG/ML IJ SOLN
50 mcg/mL | INTRAMUSCULAR | Status: DC | PRN
Start: 2019-07-06 — End: 2019-07-06
  Administered 2019-07-06: 13:00:00 via INTRAVENOUS

## 2019-07-06 MED ORDER — IOPAMIDOL 76 % IV SOLN
76 % | INTRAVENOUS | Status: DC | PRN
Start: 2019-07-06 — End: 2019-07-06
  Administered 2019-07-06: 13:00:00

## 2019-07-06 MED FILL — MEROPENEM 500 MG IV SOLR: 500 mg | INTRAVENOUS | Qty: 500

## 2019-07-06 MED FILL — LOKELMA 10 GRAM ORAL POWDER PACKET: 10 gram | ORAL | Qty: 1

## 2019-07-06 MED FILL — SODIUM CHLORIDE 0.45 % IV: 0.45 % | INTRAVENOUS | Qty: 1000

## 2019-07-06 MED FILL — SODIUM BICARBONATE 650 MG TAB: 650 mg | ORAL | Qty: 1

## 2019-07-06 MED FILL — ISOVUE-370  76 % INTRAVENOUS SOLUTION: 370 mg iodine /mL (76 %) | INTRAVENOUS | Qty: 50

## 2019-07-06 MED FILL — DEXAMETHASONE SODIUM PHOSPHATE 4 MG/ML IJ SOLN: 4 mg/mL | INTRAMUSCULAR | Qty: 2

## 2019-07-06 MED FILL — ONDANSETRON (PF) 4 MG/2 ML INJECTION: 4 mg/2 mL | INTRAMUSCULAR | Qty: 2

## 2019-07-06 MED FILL — HEPARIN (PORCINE) 5,000 UNIT/ML IJ SOLN: 5000 unit/mL | INTRAMUSCULAR | Qty: 1

## 2019-07-06 MED FILL — TAMSULOSIN SR 0.4 MG 24 HR CAP: 0.4 mg | ORAL | Qty: 1

## 2019-07-06 MED FILL — NOREPINEPHRINE 8 MG/250 ML (32 MCG/ML) D5W INFUSION: 8 mg/250 mL (32 mcg/mL) | INTRAVENOUS | Qty: 250

## 2019-07-06 MED FILL — FENTANYL CITRATE (PF) 50 MCG/ML IJ SOLN: 50 mcg/mL | INTRAMUSCULAR | Qty: 2

## 2019-07-06 MED FILL — PROPOFOL 10 MG/ML IV EMUL: 10 mg/mL | INTRAVENOUS | Qty: 20

## 2019-07-06 NOTE — Progress Notes (Addendum)
General Daily Progress Note          Patient Name:   Wesley Clark       Date of Birth:   09-Mar-1955       Age:  64 y.o.      Admit Date: 07/03/2019      Subjective:       Patient alert awake denies any chest pain or shortness of breath     Patient have cystoscopy done and bilateral ureteral stent placed      Objective:     Visit Vitals  BP 96/61 (BP 1 Location: Right upper arm, BP Patient Position: At rest)   Pulse 67   Temp 97 ??F (36.1 ??C)   Resp 13   Ht 5' 8"  (1.727 m)   Wt 47.9 kg (105 lb 9.6 oz)   SpO2 100%   BMI 16.06 kg/m??        Recent Results (from the past 24 hour(s))   GLUCOSE, POC    Collection Time: 07/05/19  3:18 PM   Result Value Ref Range    Glucose (POC) 118 (H) 65 - 100 mg/dL    Performed by Heath, QT    Collection Time: 07/06/19  8:30 AM   Result Value Ref Range    C-Reactive protein 1.57 (H) 0.00 - 0.60 mg/dL   PROCALCITONIN    Collection Time: 07/06/19  8:30 AM   Result Value Ref Range    Procalcitonin 0.30 (H) 0 ng/mL   CBC WITH AUTOMATED DIFF    Collection Time: 07/06/19  8:30 AM   Result Value Ref Range    WBC 5.9 4.1 - 11.1 K/uL    RBC 3.25 (L) 4.10 - 5.70 M/uL    HGB 9.7 (L) 12.1 - 17.0 g/dL    HCT 30.5 (L) 36.6 - 50.3 %    MCV 93.8 80.0 - 99.0 FL    MCH 29.8 26.0 - 34.0 PG    MCHC 31.8 30.0 - 36.5 g/dL    RDW 13.5 11.5 - 14.5 %    PLATELET 224 150 - 400 K/uL    MPV 10.3 8.9 - 12.9 FL    NRBC 0.0 0.0 PER 100 WBC    ABSOLUTE NRBC 0.00 0.00 - 0.01 K/uL    NEUTROPHILS 63 32 - 75 %    LYMPHOCYTES 18 12 - 49 %    MONOCYTES 9 5 - 13 %    EOSINOPHILS 9 (H) 0 - 7 %    BASOPHILS 1 0 - 1 %    IMMATURE GRANULOCYTES 0 0 - 0.5 %    ABS. NEUTROPHILS 3.7 1.8 - 8.0 K/UL    ABS. LYMPHOCYTES 1.1 0.8 - 3.5 K/UL    ABS. MONOCYTES 0.6 0.0 - 1.0 K/UL    ABS. EOSINOPHILS 0.5 (H) 0.0 - 0.4 K/UL    ABS. BASOPHILS 0.0 0.0 - 0.1 K/UL    ABS. IMM. GRANS. 0.0 0.00 - 0.04 K/UL    DF AUTOMATED     METABOLIC PANEL, COMPREHENSIVE    Collection Time: 07/06/19  8:30 AM   Result Value Ref Range     Sodium 143 136 - 145 mmol/L    Potassium 5.7 (H) 3.5 - 5.1 mmol/L    Chloride 115 (H) 97 - 108 mmol/L    CO2 25 21 - 32 mmol/L    Anion gap 3 (L) 5 - 15 mmol/L    Glucose 84 65 - 100 mg/dL  BUN 59 (H) 6 - 20 mg/dL    Creatinine 2.39 (H) 0.70 - 1.30 mg/dL    BUN/Creatinine ratio 25 (H) 12 - 20      GFR est AA 33 (L) >60 ml/min/1.50m    GFR est non-AA 28 (L) >60 ml/min/1.747m   Calcium 7.6 (L) 8.5 - 10.1 mg/dL    Bilirubin, total 0.3 0.2 - 1.0 mg/dL    AST (SGOT) 17 15 - 37 U/L    ALT (SGPT) 20 12 - 78 U/L    Alk. phosphatase 69 45 - 117 U/L    Protein, total 6.9 6.4 - 8.2 g/dL    Albumin 2.4 (L) 3.5 - 5.0 g/dL    Globulin 4.5 (H) 2.0 - 4.0 g/dL    A-G Ratio 0.5 (L) 1.1 - 2.2       @LABCOMPINFO @      Review of Systems    Constitutional: Negative for chills and fever.   HENT: Negative.    Eyes: Negative.    Respiratory: Negative.    Cardiovascular: Negative.    Gastrointestinal: Negative for abdominal pain and nausea.   Skin: Negative.    Neurological: Negative.        Physical Exam:      Constitutional: pt is oriented to person, place, and time.   HENT:   Head: Normocephalic and atraumatic.   Eyes: Pupils are equal, round, and reactive to light. EOM are normal.   Cardiovascular: Normal rate, regular rhythm and normal heart sounds.   Pulmonary/Chest: Breath sounds normal. No wheezes. No rales.   Exhibits no tenderness.   Abdominal: Soft. Bowel sounds are normal. There is no abdominal tenderness. There is no rebound and no guarding.   Musculoskeletal: Normal range of motion.   Neurological: pt is alert and oriented to person, place, and time.     XR FLUOROSCOPY UNDER 60 MINUTES   Final Result      [place]ETROPERITONEUM COMP   Final Result   1. Bilateral moderate hydronephrosis, similar to previous.   2. Increased left renal echotexture from medical renal disease.      [place]OPERITONEUM COMP   Final Result   Bilateral hydronephrosis likely due to reflux from distended   bladder.       XR CHEST SNGL V   Final Result       CT ABD PELV WO CONT    (Results Pending)        Recent Results (from the past 24 hour(s))   GLUCOSE, POC    Collection Time: 07/05/19  3:18 PM   Result Value Ref Range    Glucose (POC) 118 (H) 65 - 100 mg/dL    Performed by [facility]    Collection Time: 07/06/19  8:30 AM   Result Value Ref Range    C-Reactive protein 1.57 (H) 0.00 - 0.60 mg/dL   PROCALCITONIN    Collection Time: 07/06/19  8:30 AM   Result Value Ref Range    Procalcitonin 0.30 (H) 0 ng/mL   CBC WITH AUTOMATED DIFF    Collection Time: 07/06/19  8:30 AM   Result Value Ref Range    WBC 5.9 4.1 - 11.1 K/uL    RBC 3.25 (L) 4.10 - 5.70 M/uL    HGB 9.7 (L) 12.1 - 17.0 g/dL    HCT 30.5 (L) 36.6 - 50.3 %    MCV 93.8 80.0 - 99.0 FL    MCH 29.8 26.0 - 34.0  PG    MCHC 31.8 30.0 - 36.5 g/dL    RDW 13.5 11.5 - 14.5 %    PLATELET 224 150 - 400 K/uL    MPV 10.3 8.9 - 12.9 FL    NRBC 0.0 0.0 PER 100 WBC    ABSOLUTE NRBC 0.00 0.00 - 0.01 K/uL    NEUTROPHILS 63 32 - 75 %    LYMPHOCYTES 18 12 - 49 %    MONOCYTES 9 5 - 13 %    EOSINOPHILS 9 (H) 0 - 7 %    BASOPHILS 1 0 - 1 %    IMMATURE GRANULOCYTES 0 0 - 0.5 %    ABS. NEUTROPHILS 3.7 1.8 - 8.0 K/UL    ABS. LYMPHOCYTES 1.1 0.8 - 3.5 K/UL    ABS. MONOCYTES 0.6 0.0 - 1.0 K/UL    ABS. EOSINOPHILS 0.5 (H) 0.0 - 0.4 K/UL    ABS. BASOPHILS 0.0 0.0 - 0.1 K/UL    ABS. IMM. GRANS. 0.0 0.00 - 0.04 K/UL    DF AUTOMATED     METABOLIC PANEL, COMPREHENSIVE    Collection Time: 07/06/19  8:30 AM   Result Value Ref Range    Sodium 143 136 - 145 mmol/L    Potassium 5.7 (H) 3.5 - 5.1 mmol/L    Chloride 115 (H) 97 - 108 mmol/L    CO2 25 21 - 32 mmol/L    Anion gap 3 (L) 5 - 15 mmol/L    Glucose 84 65 - 100 mg/dL    BUN 59 (H) 6 - 20 mg/dL    Creatinine 2.39 (H) 0.70 - 1.30 mg/dL    BUN/Creatinine ratio 25 (H) 12 - 20      GFR est AA 33 (L) >60 ml/min/1.39m    GFR est non-AA 28 (L) >60 ml/min/1.771m   Calcium 7.6 (L) 8.5 - 10.1 mg/dL    Bilirubin, total 0.3 0.2 - 1.0 mg/dL    AST (SGOT) 17 15 - 37 U/L     ALT (SGPT) 20 12 - 78 U/L    Alk. phosphatase 69 45 - 117 U/L    Protein, total 6.9 6.4 - 8.2 g/dL    Albumin 2.4 (L) 3.5 - 5.0 g/dL    Globulin 4.5 (H) 2.0 - 4.0 g/dL    A-G Ratio 0.5 (L) 1.1 - 2.2         Results     Procedure Component Value Units Date/Time    MRSA SCREEN - PCR (NASAL) [[id number]]ollected: 07/04/19 1543    Order Status: Completed Specimen: Swab Updated: 07/04/19 2203     MRSA by PCR, Nasal Not Detected       COVID-19 RAPID TEST [[id number]]ollected: 07/04/19 0630    Order Status: Completed Specimen: Nasopharyngeal Updated: 07/04/19 0738     Specimen source Nasopharyngeal        COVID-19 rapid test Not Detected        Comment: Rapid Abbott ID Now   Rapid NAAT:  The specimen is NEGATIVE for SARS-CoV-2, the novel coronavirus associated with COVID-19.   Negative results should be treated as presumptive and, if inconsistent with clinical signs and symptoms or necessary for patient management, should be tested with an alternative molecular assay. Negative results do not preclude SARS-CoV-2 infection and should not be used as the sole basis for patient management decisions.   This test has been authorized by the FDA under   an Emergency Use Authorization (EUA) for use by authorized laboratories. Fact sheet  for Healthcare Providers: LittleDVDs.dk Fact sheet for Patients: SatelliteRebate.it   Methodology: Isothermal Nucleic Acid Amplification         CULTURE, URINE [[id number]] Collected: 07/03/19 1615    Order Status: Completed Specimen: Urine Updated: 07/05/19 0838     Special Requests: --        No Special Requests  Reflexed from [id number]       Culture result: No Growth (<1000 cfu/mL)              Labs:     Recent Labs     07/06/19  0830 07/05/19  0425   WBC 5.9 5.2   5.4   HGB 9.7* 9.8*   9.8*   HCT 30.5* 29.7*   30.3*   PLT 224 202   209     Recent Labs     07/06/19  0830 07/05/19  0425 07/04/19  0345 07/03/19  1638 07/03/19  1638   NA 143 141   140  134*   < > 133*   K 5.7* 4.2   4.2 4.6   < > 6.3*   CL 115* 114*   114* 105   < > 107   CO2 25 20*   21 20*   < > 15*   BUN 59* 81*   81* 103*   < > 114*   CREA 2.39* 3.56*   3.55* 5.28*   < > 6.84*   GLU 84 103*   103* 90   < > 113*   CA 7.6* 7.1*   7.0*   7.1* 8.1*   < > 9.1   MG  --   --  2.0  --  2.6*   PHOS  --  3.4  --   --  6.6*    < > = values in this interval not displayed.     Recent Labs     07/06/19  0830 07/05/19  0425 07/04/19  0345   ALT 20 8* 9*   AP 69 58 72   TBILI 0.3 0.2 0.5   TP 6.9 7.0 8.3*   ALB 2.4* 2.4*   2.4* 2.9*   GLOB 4.5* 4.6* 5.4*     No results for input(s): INR, PTP, APTT, INREXT, INREXT in the last 72 hours.   Recent Labs     07/05/19  0425   TIBC 178*   PSAT 62*      No results found for: FOL, RBCF   No results for input(s): PH, PCO2, PO2 in the last 72 hours.  Recent Labs     07/03/19  1638   TROIQ <0.05     No results found for: CHOL, CHOLX, CHLST, CHOLV, HDL, HDLP, LDL, LDLC, DLDLP, TGLX, TRIGL, TRIGP, CHHD, CHHDX  Lab Results   Component Value Date/Time    Glucose (POC) 118 (H) 07/05/2019 03:18 PM    Glucose (POC) 130 (H) 07/05/2019 11:14 AM    Glucose (POC) 139 (H) 07/04/2019 09:33 PM    Glucose (POC) 112 (H) 07/04/2019 04:02 PM    Glucose (POC) 155 (H) 07/04/2019 08:19 AM     Lab Results   Component Value Date/Time    Color Yellow/Straw 07/03/2019 04:15 PM    Appearance Turbid (A) 07/03/2019 04:15 PM    Specific gravity 1.008 07/03/2019 04:15 PM    pH (UA) 6.0 07/03/2019 04:15 PM    Protein 100 (A) 07/03/2019 04:15 PM    Glucose 50 (A) 07/03/2019 04:15 PM    Ketone Negative  07/03/2019 04:15 PM    Bilirubin Negative 07/03/2019 04:15 PM    Urobilinogen 0.1 07/03/2019 04:15 PM    Nitrites Negative 07/03/2019 04:15 PM    Leukocyte Esterase Moderate (A) 07/03/2019 04:15 PM    Bacteria Negative 07/03/2019 04:15 PM    WBC >100 (H) 07/03/2019 04:15 PM    RBC >100 (H) 07/03/2019 04:15 PM         Assessment:     Acute kidney injury  Bilateral hydronephrosis s/p cystoscopy bilateral  ureteral stent placement  Hypotension  Urinary retention  Protein calorie malnutrition moderate  Electrolyte disturbances??-hyponatremia, hyperkalemia, hyperphosphatemia, hypermagnesemia  Decreased mobility  History of cardiac disease and pacemaker  History of high blood pressure  History of prostate cancer  Abnormal chest x-ray suggestive of emphysema??-patient does not currently smoke  Cachexia  Hyperkalemia      Plan:     On IV meropenem  Subcu heparin 5000 every 8 hours  Flomax 0.4 daily  Off Levophed drip  Lokelma 10 g 1 dose    ID and renal consult    Repeat the labs in the morning and transferred to the medical telemetry floor        Current Facility-Administered Medications:   ???  0.45% sodium chloride infusion, 100 mL/hr, IntraVENous, CONTINUOUS, Gerhard Perches, MD, Last Rate: 100 mL/hr at 07/06/19 1237, 100 mL/hr at 07/06/19 1237  ???  sodium zirconium cyclosilicate (LOKELMA) powder packet 10 g, 10 g, Oral, NOW, Gerhard Perches, MD  ???  tamsulosin [facility]) capsule 0.4 mg, 0.4 mg, Oral, QHS, Gerhard Perches, MD, 0.4 mg at 07/05/19 2134  ???  sodium bicarbonate tablet 650 mg, 650 mg, Oral, BID, Gerhard Perches, MD, 650 mg at 07/06/19 1024  ???  NOREPINephrine (LEVOPHED) 8 mg in 5% dextrose 25m (32 mcg/mL) infusion, 0.5-30 mcg/min, IntraVENous, TITRATE, Jabbar Palmero, AMarcy Siren MD, Stopped at 07/05/19 1642  ???  meropenem (MERREM) 500 mg in sterile water (preservative free) 10 mL IV syringe, 0.5 g, IntraVENous, Q12H, HVallarie Mare MD, 500 mg at 07/06/19 0900  ???  insulin lispro (HUMALOG) injection, , SubCUTAneous, ACB&D, RDemetrios Loll NP, Stopped at 07/04/19 0730  ???  glucose chewable tablet 16 g, 4 Tab, Oral, PRN, RDemetrios Loll NP  ???  dextrose (D50W) injection syrg 12.5-25 g, 25-50 mL, IntraVENous, PRN, RDemetrios Loll NP  ???  glucagon (GLUCAGEN) injection 1 mg, 1 mg, IntraMUSCular, PRN, RDemetrios Loll NP  ???  sodium chloride (NS) flush 5-40 mL, 5-40 mL, IntraVENous, Q8H, RDemetrios Loll NP, 20 mL at 07/06/19  0600  ???  sodium chloride (NS) flush 5-40 mL, 5-40 mL, IntraVENous, PRN, RDemetrios Loll NP  ???  acetaminophen (TYLENOL) tablet 650 mg, 650 mg, Oral, Q6H PRN **OR** acetaminophen (TYLENOL) suppository 650 mg, 650 mg, Rectal, Q6H PRN, RDemetrios Loll NP  ???  polyethylene glycol (MIRALAX) packet 17 g, 17 g, Oral, DAILY PRN, RDemetrios Loll NP  ???  promethazine (PHENERGAN) tablet 12.5 mg, 12.5 mg, Oral, Q6H PRN **OR** ondansetron (ZOFRAN) injection 4 mg, 4 mg, IntraVENous, Q6H PRN, RDemetrios Loll NP  ???  heparin (porcine) injection 5,000 Units, 5,000 Units, SubCUTAneous, Q8H, RDemetrios Loll NP, 5,000 Units at 07/05/19 2134

## 2019-07-06 NOTE — Op Note (Signed)
UROLOGY OPERATIVE NOTE    Patient: Wesley Clark MRN: [id number]  SSN: [id number]    Date of Birth: May 21, 1955  Age: 64 y.o.  Sex: male          Pre-operative Diagnosis: HYDRONEPHROSIS  Post-operative Diagnosis: HYDRONEPHROSIS  Procedure: Cystourethroscopy  Cystoscopy, retrograde pyelograms  BILATERAL  ureteral stent placement    Surgeon: Woodroe Chen, MD    Anesthesia:  General  Findings:    Bladder with inflammatory appearance.    Interpretation of left retrograde pyelogram:  Markedly dilated tortuous appearing left ureter and renal pelvis.  The ureter appeared obstruced at the level of the ureteral orifices and bladder hiatus.  The calyces were blunted and the renal pelvis dilated.  Severe hydronephrosis and hydroureter.     Interpretation of right retrograde pyelogram: Markedly dilated tortuous appearing left ureter and renal pelvis.  The ureter appeared obstruced at the level of the ureteral orifices and bladder hiatus.  The calyces were blunted and the renal pelvis dilated.  Severe hydronephrosis and hydroureter.     Estimated Blood Loss:       none  Drains: JJ ureteral stents, 52F x 24cm on the right, 52F x 26cm on the left, 152F foley  Specimens: none  Implants:   Implant Name Type Inv. Item Serial No. Manufacturer Lot No. LRB No. Used Action   STENT URET 52FR L26CM DBL [employer] TAPR TIP - SNA  STENT URET 52FR L26CM DBL PIGTAIL[employer] LUBR[employer]AT TAPR TIP NA [employer] [id number]  1 Wasted   STENT URET 52FR L26CM DBL [employer] TAPR TIP - SNA  STENT URET 52FR L26CM DBL [employer] T[employer]R TIP NA [employer] [id number] Right 1 Implanted   STENT URET 52FR L24CM DBL [employer] T[employer] TIP - SNA  STENT URET 52FR L24CM DBL [employer] TIP NA [employer] [id number] Left 1 Implanted     Complications: none           Procedure Details:   The patient was seen in the pre-operative area. The risks, benefits, complications, alternative treatment  options, and expected outcomes were again discussed with the patient. The possibilities of reaction to medication, pain, infection, bleeding, major cardiovascular event, death, damage to surrounding structures were specifically addressed. Informed consent was obtained.      Upon arrival to the operative suite, the patient, procedure, and side were confirmed via a pre-operative "time-out". All were in agreement. The patient was carefully positioned and anesthesia was undertaken. Sterile prep and drape was accomplished.    Using a 50F cystoscope, cystourethroscopy was performed.  The pendulous urethra was unremarkable.  The prostatic urethra was atrophic and widely patent.  The bladder was inflamed appearing with trabeculation and mucosal edema and erythema.      Using a 5 [place] open-ended catheter and a 0.035 guidewire, the right ureter orifice was cannulated.  The wire was removed and a gentle retrograde pyelograms performed.  There appeared to be taking his stricturing at the ureterovesical junction.  The ureter was markedly dilated and tortuous.  The wire was reinserted and the catheter was navigated up to the proximal ureter.  The wire was removed and a retrograde pyelogram of the upper tract was performed.  The renal pelvis markedly dilated with largely dilated calyces.  The wires were placed and the catheter was removed.  Over the wire a 6 [place] by 24 cm double-J stent was passed with a curl in a upper mid  calyx and in the bladder on fluoroscopy and direct vision.    Similarly the left ureteral orifice was cannulated with a wire and catheter.  The wire was removed and a gentle retrograde pyelograms performed.  The findings were similar on the left side with marked tortuosity and dilation of the ureter with probable obstruction at the ureterovesical junction.  The upper tracts were similarly markedly dilated.  The wire was left in the renal pelvis and the catheter was removed.  A 6 French by 26 cm double-J  stent was passed with a curl in the renal pelvis and in the bladder on fluoroscopy and direct visualization.    The scope was removed.  A 16 French Foley catheter is placed and bladder was drained.  The patient tolerated the procedure well was transported in stable condition to recovery.    Woodroe Chen, MD

## 2019-07-06 NOTE — Progress Notes (Signed)
Progress Note    Patient: Wesley Clark MRN: [id number]  SSN: [id number]    Date of Birth: Nov 17, 1955  Age: 64 y.o.  Sex: male      Admit Date: 07/03/2019    LOS: 3 days     Subjective:   Patient followed for suspected complicated UTI and HIV-1 infection but urine culture was negative.  He remains afebrile with normal WBC but elevated procal and CRP.  He is currently on IV Meropenem.  Also seen by Urology and left ureteral stent placed.  Bladder was inflammed during cystoscopy.  Patient awake and responsive, sitting up eating lunch.  No complaints.  Objective:     Vitals:    07/06/19 0924 07/06/19 0926 07/06/19 0931 07/06/19 0939   BP: (!) 84/55 (!) 88/63 (!) 78/57 110/60   Pulse: 60  61 64   Resp: 12  15 16    Temp:       SpO2: 100%      Weight:       Height:            Intake and Output:  Current Shift: 05/06 0701 - 05/06 1900  In: -   Out: 100 [Urine:100]  Last three shifts: 05/04 1901 - 05/06 0700  In: 600 [P.O.:600]  Out: 4650 [Urine:4650]    Physical Exam:   Vitals signs  and nursing note reviewed.   Constitutional:       General: He is not in acute distress.     Appearance: Normal appearance. He is ill-appearing.   HENT:      Head: Normocephalic and atraumatic.      Right Ear: External ear normal.      Left Ear: External ear normal.      Nose: Nose normal.      Mouth/Throat:      Pharynx: Oropharynx is clear.      Comments: Multiple missing teeth  Eyes:      Pupils: Pupils are equal, round, and reactive to light.   Neck:      Musculoskeletal: Neck supple.   Cardiovascular:      Rate and Rhythm: Regular rhythm. Tachycardia present.      Heart sounds: Normal heart sounds. No murmur.   Pulmonary:      Breath sounds: No wheezing, rhonchi or rales.   Abdominal:      General: Abdomen is flat. Bowel sounds are normal.      Palpations: Abdomen is soft.      Tenderness: There is no abdominal tenderness.   Genitourinary:     Comments: Foley catheter  Musculoskeletal:      Right lower leg: No edema.      Left lower leg:  No edema.   Skin:     Findings: No lesion or rash.   Neurological:      General: No focal deficit present.      Mental Status: He is alert. He is disoriented.   Psychiatric:         Mood and Affect: Mood normal.         Behavior: Behavior normal.         Thought Content: Thought content normal.      Lab/Data Review:     WBC 5,200    Procal 0.34  CRP 2.01    HIV-1 antibody Positive  CD4 count Pending  HIV-1 RNA viral load Pending    Urine culture (5/3) No growth    Assessment:     Active Problems:  Renal failure (07/03/2019)      Hyperkalemia (07/03/2019)      Electrolyte imbalance (07/03/2019)      AKI (acute kidney injury) (HCC) (07/03/2019)      1. Complicated UTI (bilateral hydronephrosis/distended bladder), with marked pyuria, urine culture negative, Day #3 IV Meropenem.  2. HIV-1 infection, confirmed, staging labs pending.  3. Hypotension, etiology unclear, ?sepsis  4. Renal failure  5. Penicillin allergy  ??  Comment:  Strong case for UTI, further supported by inflammed bladder described at cystoscopy, with likely false negative urine culture.    Plan:   1. Continue IV Meropenem for complicated UTI  2. In am, repeat procal and CRP  3. Repeat urinalysis in 48 hours  4. Follow-up HIV-1 RNA and CD4 count  5. Will contact Dr. Ricarda Frame, [facility] once CD4 and viral load back.      Signed By: Lorina Rabon, MD     Jul 06, 2019

## 2019-07-06 NOTE — Progress Notes (Addendum)
[facility]     Renal Daily Progress Note:     Admission Date: 07/03/2019     Subjective: feels better, BP remains low but he is asymptomatic, eating. No N/V or abd pain, foley in, urine output markedly increased after bilateral ureteral stenting.    Review of Systems  Pertinent items are noted in HPI.    Objective:     Visit Vitals  BP 96/61 (BP 1 Location: Right upper arm, BP Patient Position: At rest)   Pulse 67   Temp 97 ??F (36.1 ??C)   Resp 13   Ht 5\' 8"  (1.727 m)   Wt 47.9 kg (105 lb 9.6 oz)   SpO2 100%   BMI 16.06 kg/m??     Temp (24hrs), Avg:97.2 ??F (36.2 ??C), Min:96.8 ??F (36 ??C), Max:97.5 ??F (36.4 ??C)        Intake/Output Summary (Last 24 hours) at 07/06/2019 1210  Last data filed at 07/06/2019 0940  Gross per 24 hour   Intake ???   Output 4050 ml   Net -4050 ml     Current Facility-Administered Medications   Medication Dose Route Frequency   ??? 0.9% sodium chloride infusion  100 mL/hr IntraVENous CONTINUOUS   ??? tamsulosin (FLOMAX) capsule 0.4 mg  0.4 mg Oral QHS   ??? sodium bicarbonate tablet 650 mg  650 mg Oral BID   ??? NOREPINephrine (LEVOPHED) 8 mg in 5% dextrose 285mL (32 mcg/mL) infusion  0.5-30 mcg/min IntraVENous TITRATE   ??? meropenem (MERREM) 500 mg in sterile water (preservative free) 10 mL IV syringe  0.5 g IntraVENous Q12H   ??? insulin lispro (HUMALOG) injection   SubCUTAneous ACB&D   ??? glucose chewable tablet 16 g  4 Tab Oral PRN   ??? dextrose (D50W) injection syrg 12.5-25 g  25-50 mL IntraVENous PRN   ??? glucagon (GLUCAGEN) injection 1 mg  1 mg IntraMUSCular PRN   ??? sodium chloride (NS) flush 5-40 mL  5-40 mL IntraVENous Q8H   ??? sodium chloride (NS) flush 5-40 mL  5-40 mL IntraVENous PRN   ??? acetaminophen (TYLENOL) tablet 650 mg  650 mg Oral Q6H PRN    Or   ??? acetaminophen (TYLENOL) suppository 650 mg  650 mg Rectal Q6H PRN   ??? polyethylene glycol (MIRALAX) packet 17 g  17 g Oral DAILY PRN   ??? promethazine (PHENERGAN) tablet 12.5 mg  12.5 mg Oral Q6H PRN    Or   ??? ondansetron (ZOFRAN) injection 4 mg   4 mg IntraVENous Q6H PRN   ??? heparin (porcine) injection 5,000 Units  5,000 Units SubCUTAneous Q8H       Physical Exam:General appearance: alert, cooperative, no distress, appears older than stated age  Lungs: clear to auscultation bilaterally  Heart: regular rate and rhythm, no S3 or S4  Abdomen: soft, non-tender. Bowel sounds normal. No masses,  no organomegaly  Extremities: no edema  Neurologic: Grossly normal    Data Review:     LABS:  Recent Labs     07/06/19  0830 07/05/19  0425 07/04/19  0345 07/03/19  1638 07/03/19  1638   NA 143 141   140 134*   < > 133*   K 5.7* 4.2   4.2 4.6   < > 6.3*   CL 115* 114*   114* 105   < > 107   CO2 25 20*   21 20*   < > 15*   BUN 59* 81*   81* 103*   < >  114*   CREA 2.39* 3.56*   3.55* 5.28*   < > 6.84*   CA 7.6* 7.1*   7.0*   7.1* 8.1*   < > 9.1   ALB 2.4* 2.4*   2.4* 2.9*  --  3.7   PHOS  --  3.4  --   --  6.6*   MG  --   --  2.0  --  2.6*    < > = values in this interval not displayed.   Fe sat 62%  Recent Labs     07/06/19  0830 07/05/19  0425 07/04/19  0345   WBC 5.9 5.2   5.4 7.9   HGB 9.7* 9.8*   9.8* 10.2*   HCT 30.5* 29.7*   30.3* 30.2*   PLT 224 202   209 243     Recent Labs     07/04/19  1250 07/03/19  2015   NAU  --  90   KU  --  21   CREAU 29.00  --      Retroperitoneal ultrasound Jul 03, 2019:  History: Renal failure  ??  Retroperitoneal ultrasound was performed. Aorta and IVC appear within normal  limits. Right kidney measures 9.4 x 4.8 x 5.5 cm. There is hydronephrosis. Left  kidney measures 9.3 x 5.1 x 5.0 cm. There is hydronephrosis. The bladder is  distended with a sludge fluid level within it. It measures 8.7 x 5.2 x 5.3 cm.  Foley catheter was placed within the bladder during the examination. There is  some decompression of the bladder. Reevaluation of the kidneys demonstrated  hydronephrosis. It is not indicated how long before reevaluation post Foley.  ??  IMPRESSION  Bilateral hydronephrosis likely due to reflux from distended  bladder.   ??  CXR 07-03-19:   Chest single view.  ??  Emphysema/bullous cysts. Small volume linear reticular markings left lung base.  No gross interstitial or alveolar pulmonary edema. Left side cardiac device with  leads overlying the heart. No pneumothorax or sizable pleural effusion.  Assessment:   Renal Specific Problems  Probable mild chronic kidney disease at baseline  Acute kidney injury related to obstructive uropathy  Bladder outlet obstruction with secondary hydronephrosis  bilateral UVJ obstruction  Hyperkalemia related to AKI-   Anemia probably related to renal insufficiency, rule out iron deficiency  Metabolic acidosis  Hyperphosphatemia  Hypoalbuminemia  Unexplained weight loss  History of prostate cancer???treatment protocol not clear  Hypotension  ??      Plan:     Obtain/ Order: labs/cultures/radiology/procedures:  daiily Renal fxn, awaiting PSA    Iron sat    Therapeutic:    Cont IVF but change to 0.45NS   Oral NaHCO3  Ensure  lokelma 1 dose for hyperkalemia; NO Kayexalate    Dala Dock, MD    [phone]

## 2019-07-06 NOTE — Progress Notes (Signed)
4 eyed skin assessment completed.  hypopigmentation present in multiple areas.  Pt has stage 2 sacrum ulcer, top of gluteal cleft, upper left buttock, and upper right buttock zinc paste applied otherwise skin warm, dry and intact

## 2019-07-06 NOTE — Progress Notes (Signed)
Patient complete meals [employer] wheels application. CM faxed application to [employer] [phone].

## 2019-07-06 NOTE — Progress Notes (Signed)
Bedside report received from Lauren RN PACU nurse, pt returned from surgery, VSS, A&Ox4

## 2019-07-06 NOTE — Progress Notes (Signed)
Discharge plan: SNF. Patient is accepted to both [facility] and [facility]. CM will continue to follow for any other DC needs.

## 2019-07-06 NOTE — Progress Notes (Signed)
Spoke to Family friend [place].  Updated her on patient's condition and procedure went well.  Pt not having any pain, VSS, and will transfer to regular floor.

## 2019-07-07 LAB — OCCULT BLOOD, STOOL
Day 1 date:: 2
Occult Blood,day 1: NEGATIVE

## 2019-07-07 LAB — METABOLIC PANEL, COMPREHENSIVE
A-G Ratio: 0.5 — ABNORMAL LOW (ref 1.1–2.2)
ALT (SGPT): 16 U/L (ref 12–78)
AST (SGOT): 7 U/L — ABNORMAL LOW (ref 15–37)
Albumin: 2 g/dL — ABNORMAL LOW (ref 3.5–5.0)
Alk. phosphatase: 58 U/L (ref 45–117)
Anion gap: 4 mmol/L — ABNORMAL LOW (ref 5–15)
BUN/Creatinine ratio: 30 — ABNORMAL HIGH (ref 12–20)
BUN: 59 mg/dL — ABNORMAL HIGH (ref 6–20)
Bilirubin, total: 0.2 mg/dL (ref 0.2–1.0)
CO2: 23 mmol/L (ref 21–32)
Calcium: 7.5 mg/dL — ABNORMAL LOW (ref 8.5–10.1)
Chloride: 111 mmol/L — ABNORMAL HIGH (ref 97–108)
Creatinine: 1.97 mg/dL — ABNORMAL HIGH (ref 0.70–1.30)
GFR est AA: 42 mL/min/{1.73_m2} — ABNORMAL LOW (ref >60)
GFR est non-AA: 34 mL/min/{1.73_m2} — ABNORMAL LOW (ref >60)
Globulin: 4 g/dL (ref 2.0–4.0)
Glucose: 117 mg/dL — ABNORMAL HIGH (ref 65–100)
Potassium: 4.8 mmol/L (ref 3.5–5.1)
Protein, total: 6 g/dL — ABNORMAL LOW (ref 6.4–8.2)
Sodium: 138 mmol/L (ref 136–145)

## 2019-07-07 LAB — CBC WITH AUTOMATED DIFF
ABS. BASOPHILS: 0 10*3/uL (ref 0.0–0.1)
ABS. EOSINOPHILS: 0 10*3/uL (ref 0.0–0.4)
ABS. IMM. GRANS.: 0 10*3/uL (ref 0.00–0.04)
ABS. LYMPHOCYTES: 1 10*3/uL (ref 0.8–3.5)
ABS. MONOCYTES: 0.4 10*3/uL (ref 0.0–1.0)
ABS. NEUTROPHILS: 6.5 10*3/uL (ref 1.8–8.0)
ABSOLUTE NRBC: 0 10*3/uL (ref 0.00–0.01)
BASOPHILS: 0 % (ref 0–1)
EOSINOPHILS: 0 % (ref 0–7)
HCT: 24.8 % — ABNORMAL LOW (ref 36.6–50.3)
HGB: 8 g/dL — ABNORMAL LOW (ref 12.1–17.0)
IMMATURE GRANULOCYTES: 1 % — ABNORMAL HIGH (ref 0–0.5)
LYMPHOCYTES: 13 % (ref 12–49)
MCH: 29.6 PG (ref 26.0–34.0)
MCHC: 32.3 g/dL (ref 30.0–36.5)
MCV: 91.9 FL (ref 80.0–99.0)
MONOCYTES: 5 % (ref 5–13)
MPV: 10.6 FL (ref 8.9–12.9)
NEUTROPHILS: 81 % — ABNORMAL HIGH (ref 32–75)
NRBC: 0 PER 100 WBC
PLATELET: 206 10*3/uL (ref 150–400)
RBC: 2.7 M/uL — ABNORMAL LOW (ref 4.10–5.70)
RDW: 13.8 % (ref 11.5–14.5)
WBC: 7.9 10*3/uL (ref 4.1–11.1)

## 2019-07-07 LAB — IRON PROFILE
Iron % saturation: 46 % (ref 20–50)
Iron: 82 ug/dL (ref 35–150)
TIBC: 178 ug/dL — ABNORMAL LOW (ref 250–450)

## 2019-07-07 LAB — VITAMIN D, 25 HYDROXY: Vitamin D 25-Hydroxy: 10.7 ng/mL — ABNORMAL LOW (ref 30–100)

## 2019-07-07 LAB — RPR: RPR: REACTIVE — AB

## 2019-07-07 LAB — GLUCOSE, POC
Glucose (POC): 123 mg/dL — ABNORMAL HIGH (ref 65–100)
Glucose (POC): 105 mg/dL — ABNORMAL HIGH (ref 65–100)

## 2019-07-07 LAB — RPR TITER: RPR titer: 1:64 {titer} — AB

## 2019-07-07 MED ORDER — DOLUTEGRAVIR 50 MG TABLET
50 mg | Freq: Every day | ORAL | Status: DC
Start: 2019-07-07 — End: 2019-07-12
  Administered 2019-07-07 – 2019-07-12 (×6): via ORAL

## 2019-07-07 MED ORDER — DAPSONE 25 MG TAB
25 mg | Freq: Every day | ORAL | Status: DC
Start: 2019-07-07 — End: 2019-07-12
  Administered 2019-07-07 – 2019-07-12 (×6): via ORAL

## 2019-07-07 MED ORDER — EMTRICITABINE 200 MG-TENOFOVIR ALAFENAMIDE FUMARATE 25 MG TABLET
200-25 mg | Freq: Every day | ORAL | Status: DC
Start: 2019-07-07 — End: 2019-07-12
  Administered 2019-07-07 – 2019-07-12 (×6): via ORAL

## 2019-07-07 MED FILL — DAPSONE 25 MG TAB: 25 mg | ORAL | Qty: 4

## 2019-07-07 MED FILL — MEROPENEM 500 MG IV SOLR: 500 mg | INTRAVENOUS | Qty: 500

## 2019-07-07 MED FILL — SODIUM BICARBONATE 650 MG TAB: 650 mg | ORAL | Qty: 1

## 2019-07-07 MED FILL — HEPARIN (PORCINE) 5,000 UNIT/ML IJ SOLN: 5000 unit/mL | INTRAMUSCULAR | Qty: 1

## 2019-07-07 MED FILL — DESCOVY 200 MG-25 MG TABLET: 200-25 mg | ORAL | Qty: 1

## 2019-07-07 MED FILL — TAMSULOSIN SR 0.4 MG 24 HR CAP: 0.4 mg | ORAL | Qty: 1

## 2019-07-07 MED FILL — TIVICAY 50 MG TABLET: 50 mg | ORAL | Qty: 1

## 2019-07-07 NOTE — Progress Notes (Signed)
Progress Note    Patient: Wesley Clark MRN: [id number]  SSN: [id number]    Date of Birth: [birth year]  Age: 64 y.o.  Sex: male      Admit Date: 07/03/2019    LOS: 4 days     Subjective:   Patient followed for suspected complicated UTI and HIV-1 infection but urine culture was negative.  He remains afebrile with normal WBC but elevated procal and CRP.  He is currently on IV Meropenem.  Also seen by Urology and left ureteral stent placed.  Bladder was inflammed during cystoscopy.  Patient awake and responsive, again sitting up eating lunch.  No complaints except being cold.  Objective:     Vitals:    07/07/19 0000 07/07/19 0300 07/07/19 0800 07/07/19 0904   BP:  (!) 90/53 101/65 111/82   Pulse: 80 88 (!) 102 85   Resp:  15 18 14    Temp:  98.2 ??F (36.8 ??C) 97.3 ??F (36.3 ??C) 97.2 ??F (36.2 ??C)   SpO2:  100% 100% 100%   Weight:       Height:            Intake and Output:  Current Shift: 05/07 0701 - 05/07 1900  In: 946 [P.O.:946]  Out: -   Last three shifts: 05/05 1901 - 05/07 0700  In: -   Out: 4725 [Urine:4725]    Physical Exam:   Vitals signs  and nursing note reviewed.   Constitutional:       General: He is not in acute distress.     Appearance: Normal appearance. He is ill-appearing.   HENT:      Head: Normocephalic and atraumatic.      Right Ear: External ear normal.      Left Ear: External ear normal.      Nose: Nose normal.      Mouth/Throat:      Pharynx: Oropharynx is clear.      Comments: Multiple missing teeth  Eyes:      Pupils: Pupils are equal, round, and reactive to light.   Neck:      Musculoskeletal: Neck supple.   Cardiovascular:      Rate and Rhythm: Regular rhythm. Tachycardia present.      Heart sounds: Normal heart sounds. No murmur.   Pulmonary:      Breath sounds: No wheezing, rhonchi or rales.   Abdominal:      General: Abdomen is flat. Bowel sounds are normal.      Palpations: Abdomen is soft.      Tenderness: There is no abdominal tenderness.   Genitourinary:     Comments: Foley catheter   Musculoskeletal:      Right lower leg: No edema.      Left lower leg: No edema.   Skin:     Findings: No lesion or rash.   Neurological:      General: No focal deficit present.      Mental Status: He is alert. He is disoriented.   Psychiatric:         Mood and Affect: Mood normal.         Behavior: Behavior normal.         Thought Content: Thought content normal.      Lab/Data Review:     WBC 7,900    Procal 0.30 <0.34  CRP 1.57 <2.01    HIV-1 antibody Positive  CD4 count 151 (16.8%)  HIV-1 RNA viral load 90,000 copies/ml    Urine culture (  5/3) No growth    Assessment:     Active Problems:    Renal failure (07/03/2019)      Hyperkalemia (07/03/2019)      Electrolyte imbalance (07/03/2019)      AKI (acute kidney injury) (HCC) (07/03/2019)      1. Complicated UTI (bilateral hydronephrosis/distended bladder), with marked pyuria, urine culture negative, Day #4 IV Meropenem.  2. HIV-1 infection, CDC Class A2 with CD4 <200 and viral load of 90,000 copies/ml.  3. Hypotension, etiology unclear, ?sepsis  4. Renal failure  5. Penicillin allergy  ??  Comment:  Patient has high viral load and CD4 <200 which indicates need for anti-retroviral therapy and pneumocystis prophylaxis.    Plan:   1. Continue IV Meropenem for complicated UTI  2. In am, repeat procal and CRP  3. Repeat urinalysis in 24 hours  4. Start (resume) Tivicay/Truvada and Dapsone  5. Will contact Dr. Ricarda Frame, [facility]      Signed By: Lorina Rabon, MD     Jul 07, 2019

## 2019-07-07 NOTE — Progress Notes (Signed)
UROLOGYPROGRESS NOTE    Dr. Vevelyn Royals, MD  Graceann Congress, APRN, FNP-C  [facility]  [phone]        Patient: Wesley Clark MRN: [id number]  SSN: [id number]    Date of Birth: [birth year]  Age: 64 y.o.  Sex: male      Subjective:      Wesley Clark is a 64 y.o. male who is being seen in urologic follow up secondary to bilateral hydronephrosis, hx of prostate cancer and UTI.    He was admitted through the ER on 07/04/19 with complaints of generalized weakness and fatigue with SOB x 2-3 days.    BNP was elevated.  UA showed pyuria without bacteria.    WBC was WNL.  Afebrile on admission, though severely hypotensive.    Initial [place] on 07/03/19 demonstrated bilateral hydronephrosis likely due to reflux from a distended bladder.  The bladder appeared to have sludge. Foley inserted (during exam).  Repeat US on 07/04/19 continued to show bilateral hydronephrosis and bladder decompression s/p foley placement.     Creatinine on admit was 6.84.    He is s/p cystourethroscopy, retrograde pyelograms, and BILATERAL  ureteral stent placement on 07/06/19.    Operative findings: both ureters appeared markedly dilated and tortuous.  Obstructed at the level of the the ureteral orifices.  Severe hydronephrosis noted bilaterally.    Past Medical History:   Diagnosis Date   ??? Prostate cancer [facility])      History reviewed. No pertinent surgical history.   History reviewed. No pertinent family history.  Social History     Tobacco Use   ??? Smoking status: Not on file   Substance Use Topics   ??? Alcohol use: Not on file      Current Facility-Administered Medications   Medication Dose Route Frequency Provider Last Rate Last Admin   ??? 0.45% sodium chloride infusion  100 mL/hr IntraVENous CONTINUOUS Gerhard Perches, MD 100 mL/hr at 07/06/19 2346 100 mL/hr at 07/06/19 2346   ??? tamsulosin (FLOMAX) capsule 0.4 mg  0.4 mg Oral QHS Gerhard Perches, MD   0.4 mg at 07/06/19 2125   ??? sodium bicarbonate tablet 650 mg  650 mg Oral BID Gerhard Perches, MD   650 mg at  07/06/19 2125   ??? NOREPINephrine (LEVOPHED) 8 mg in 5% dextrose 274m (32 mcg/mL) infusion  0.5-30 mcg/min IntraVENous TITRATE Mohiuddin, AMarcy Siren MD   Stopped at 07/05/19 1642   ??? meropenem (MERREM) 500 mg in sterile water (preservative free) 10 mL IV syringe  0.5 g IntraVENous Q12H HVallarie Mare MD   500 mg at 07/06/19 2125   ??? insulin lispro (HUMALOG) injection   SubCUTAneous ACB&D RDemetrios Loll NP   Stopped at 07/04/19 0730   ??? glucose chewable tablet 16 g  4 Tab Oral PRN RDemetrios Loll NP       ??? dextrose (D50W) injection syrg 12.5-25 g  25-50 mL IntraVENous PRN RDemetrios Loll NP       ??? glucagon (GLUCAGEN) injection 1 mg  1 mg IntraMUSCular PRN RDemetrios Loll NP       ??? sodium chloride (NS) flush 5-40 mL  5-40 mL IntraVENous Q8H RDemetrios Loll NP   10 mL at 07/07/19 0650   ??? sodium chloride (NS) flush 5-40 mL  5-40 mL IntraVENous PRN RDemetrios Loll NP       ??? acetaminophen (TYLENOL) tablet 650 mg  650 mg Oral Q6H PRN RDemetrios Loll NP        Or   ???  acetaminophen (TYLENOL) suppository 650 mg  650 mg Rectal Q6H PRN Demetrios Loll, NP       ??? polyethylene glycol (MIRALAX) packet 17 g  17 g Oral DAILY PRN Demetrios Loll, NP       ??? promethazine (PHENERGAN) tablet 12.5 mg  12.5 mg Oral Q6H PRN Demetrios Loll, NP        Or   ??? ondansetron [facility]) injection 4 mg  4 mg IntraVENous Q6H PRN Demetrios Loll, NP       ??? heparin (porcine) injection 5,000 Units  5,000 Units SubCUTAneous Q8H Demetrios Loll, NP   5,000 Units at 07/07/19 1610        Allergies   Allergen Reactions   ??? Penicillins Rash       Review of Systems:  Review of Systems   Constitutional: Negative for chills and fever.   HENT: Negative for sinus pain and sore throat.    Respiratory: Negative for cough, sputum production and shortness of breath.    Gastrointestinal: Negative for diarrhea, nausea and vomiting.   Musculoskeletal: Negative for myalgias.   Neurological: Negative for headaches.       Objective:     Vitals:     07/06/19 2117 07/06/19 2300 07/07/19 0000 07/07/19 0300   BP:  (!) 86/65  (!) 90/53   Pulse:  89 80 88   Resp:  16  15   Temp:  98.6 ??F (37 ??C)  98.2 ??F (36.8 ??C)   SpO2: 100% 100%  100%   Weight:       Height:            Recent Labs     07/07/19  0550 07/06/19  0830 07/05/19  0425   WBC 7.9 5.9 5.2   5.4   HGB 8.0* 9.7* 9.8*   9.8*   HCT 24.8* 30.5* 29.7*   30.3*   PLT 206 224 202   209     Recent Labs     07/07/19  0550 07/06/19  0830 07/05/19  0425   NA 138 143 141   140   K 4.8 5.7* 4.2   4.2   CL 111* 115* 114*   114*   CO2 23 25 20*   21   GLU 117* 84 103*   103*   BUN 59* 59* 81*   81*   CREA 1.97* 2.39* 3.56*   3.55*   CA 7.5* 7.6* 7.1*   7.0*   7.1*   PHOS  --   --  3.4   ALB 2.0* 2.4* 2.4*   2.4*   TBILI 0.2 0.3 0.2   ALT 16 20 8*     No results for input(s): PH, PCO2, PO2, HCO3, FIO2 in the last 72 hours.    24 Hour Results:  Recent Results (from the past 24 hour(s))   C REACTIVE PROTEIN, QT    Collection Time: 07/06/19  8:30 AM   Result Value Ref Range    C-Reactive protein 1.57 (H) 0.00 - 0.60 mg/dL   PROCALCITONIN    Collection Time: 07/06/19  8:30 AM   Result Value Ref Range    Procalcitonin 0.30 (H) 0 ng/mL   CBC WITH AUTOMATED DIFF    Collection Time: 07/06/19  8:30 AM   Result Value Ref Range    WBC 5.9 4.1 - 11.1 K/uL    RBC 3.25 (L) 4.10 - 5.70 M/uL    HGB 9.7 (L) 12.1 - 17.0 g/dL    HCT  30.5 (L) 36.6 - 50.3 %    MCV 93.8 80.0 - 99.0 FL    MCH 29.8 26.0 - 34.0 PG    MCHC 31.8 30.0 - 36.5 g/dL    RDW 13.5 11.5 - 14.5 %    PLATELET 224 150 - 400 K/uL    MPV 10.3 8.9 - 12.9 FL    NRBC 0.0 0.0 PER 100 WBC    ABSOLUTE NRBC 0.00 0.00 - 0.01 K/uL    NEUTROPHILS 63 32 - 75 %    LYMPHOCYTES 18 12 - 49 %    MONOCYTES 9 5 - 13 %    EOSINOPHILS 9 (H) 0 - 7 %    BASOPHILS 1 0 - 1 %    IMMATURE GRANULOCYTES 0 0 - 0.5 %    ABS. NEUTROPHILS 3.7 1.8 - 8.0 K/UL    ABS. LYMPHOCYTES 1.1 0.8 - 3.5 K/UL    ABS. MONOCYTES 0.6 0.0 - 1.0 K/UL    ABS. EOSINOPHILS 0.5 (H) 0.0 - 0.4 K/UL    ABS. BASOPHILS 0.0 0.0 - 0.1 K/UL     ABS. IMM. GRANS. 0.0 0.00 - 0.04 K/UL    DF AUTOMATED     METABOLIC PANEL, COMPREHENSIVE    Collection Time: 07/06/19  8:30 AM   Result Value Ref Range    Sodium 143 136 - 145 mmol/L    Potassium 5.7 (H) 3.5 - 5.1 mmol/L    Chloride 115 (H) 97 - 108 mmol/L    CO2 25 21 - 32 mmol/L    Anion gap 3 (L) 5 - 15 mmol/L    Glucose 84 65 - 100 mg/dL    BUN 59 (H) 6 - 20 mg/dL    Creatinine 2.39 (H) 0.70 - 1.30 mg/dL    BUN/Creatinine ratio 25 (H) 12 - 20      GFR est AA 33 (L) >60 ml/min/1.58m    GFR est non-AA 28 (L) >60 ml/min/1.732m   Calcium 7.6 (L) 8.5 - 10.1 mg/dL    Bilirubin, total 0.3 0.2 - 1.0 mg/dL    AST (SGOT) 17 15 - 37 U/L    ALT (SGPT) 20 12 - 78 U/L    Alk. phosphatase 69 45 - 117 U/L    Protein, total 6.9 6.4 - 8.2 g/dL    Albumin 2.4 (L) 3.5 - 5.0 g/dL    Globulin 4.5 (H) 2.0 - 4.0 g/dL    A-G Ratio 0.5 (L) 1.1 - 2.2     IRON PROFILE    Collection Time: 07/06/19  8:30 AM   Result Value Ref Range    Iron 82 35 - 150 ug/dL    TIBC 178 (L) 250 - 450 ug/dL    Iron % saturation 46 20 - 50 %   GLUCOSE, POC    Collection Time: 07/06/19  3:54 PM   Result Value Ref Range    Glucose (POC) 167 (H) 65 - 100 mg/dL    Performed by [facility]    CBC WITH AUTOMATED DIFF    Collection Time: 07/07/19  5:50 AM   Result Value Ref Range    WBC 7.9 4.1 - 11.1 K/uL    RBC 2.70 (L) 4.10 - 5.70 M/uL    HGB 8.0 (L) 12.1 - 17.0 g/dL    HCT 24.8 (L) 36.6 - 50.3 %    MCV 91.9 80.0 - 99.0 FL    MCH 29.6 26.0 - 34.0 PG    MCHC 32.3 30.0 - 36.5  g/dL    RDW 13.8 11.5 - 14.5 %    PLATELET 206 150 - 400 K/uL    MPV 10.6 8.9 - 12.9 FL    NRBC 0.0 0.0 PER 100 WBC    ABSOLUTE NRBC 0.00 0.00 - 0.01 K/uL    NEUTROPHILS 81 (H) 32 - 75 %    LYMPHOCYTES 13 12 - 49 %    MONOCYTES 5 5 - 13 %    EOSINOPHILS 0 0 - 7 %    BASOPHILS 0 0 - 1 %    IMMATURE GRANULOCYTES 1 (H) 0 - 0.5 %    ABS. NEUTROPHILS 6.5 1.8 - 8.0 K/UL    ABS. LYMPHOCYTES 1.0 0.8 - 3.5 K/UL    ABS. MONOCYTES 0.4 0.0 - 1.0 K/UL    ABS. EOSINOPHILS 0.0 0.0 - 0.4 K/UL    ABS.  BASOPHILS 0.0 0.0 - 0.1 K/UL    ABS. IMM. GRANS. 0.0 0.00 - 0.04 K/UL    DF AUTOMATED     METABOLIC PANEL, COMPREHENSIVE    Collection Time: 07/07/19  5:50 AM   Result Value Ref Range    Sodium 138 136 - 145 mmol/L    Potassium 4.8 3.5 - 5.1 mmol/L    Chloride 111 (H) 97 - 108 mmol/L    CO2 23 21 - 32 mmol/L    Anion gap 4 (L) 5 - 15 mmol/L    Glucose 117 (H) 65 - 100 mg/dL    BUN 59 (H) 6 - 20 mg/dL    Creatinine 1.97 (H) 0.70 - 1.30 mg/dL    BUN/Creatinine ratio 30 (H) 12 - 20      GFR est AA 42 (L) >60 ml/min/1.44m    GFR est non-AA 34 (L) >60 ml/min/1.772m   Calcium 7.5 (L) 8.5 - 10.1 mg/dL    Bilirubin, total 0.2 0.2 - 1.0 mg/dL    AST (SGOT) 7 (L) 15 - 37 U/L    ALT (SGPT) 16 12 - 78 U/L    Alk. phosphatase 58 45 - 117 U/L    Protein, total 6.0 (L) 6.4 - 8.2 g/dL    Albumin 2.0 (L) 3.5 - 5.0 g/dL    Globulin 4.0 2.0 - 4.0 g/dL    A-G Ratio 0.5 (L) 1.1 - 2.2         Physical Exam:  Physical Exam  Vitals signs and nursing note reviewed.   Constitutional:       Appearance: Normal appearance.      Comments: Very thin   HENT:      Head: Normocephalic and atraumatic.      Right Ear: External ear normal.      Left Ear: External ear normal.      Mouth/Throat:      Comments: Missing teeth  Eyes:      Extraocular Movements: Extraocular movements intact.      Conjunctiva/sclera: Conjunctivae normal.   Neck:      Musculoskeletal: Normal range of motion.   Cardiovascular:      Rate and Rhythm: Normal rate.      Heart sounds: Normal heart sounds.      Comments: Visible implanted device  Pulmonary:      Effort: Pulmonary effort is normal. No respiratory distress.   Abdominal:      General: Abdomen is flat.   Genitourinary:     Penis: Normal.       Testes: Normal.      Comments: Foley  Musculoskeletal: Normal range  of motion.   Skin:     General: Skin is warm.   Neurological:      General: No focal deficit present.      Mental Status: He is alert and oriented to person, place, and time.           Assessment:     Hospital  Problems  Never Reviewed          Codes Class Noted POA    Renal failure ICD-10-CM: N19  ICD-9-CM: 875  07/03/2019 Unknown        Hyperkalemia ICD-10-CM: E87.5  ICD-9-CM: 276.7  07/03/2019 Unknown        Electrolyte imbalance ICD-10-CM: E87.8  ICD-9-CM: 276.9  07/03/2019 Unknown        AKI (acute kidney injury) [facility]) ICD-10-CM: N17.9  ICD-9-CM: 584.9  07/03/2019 Unknown              Plan:     Hospital Problems  Never Reviewed          Codes Class Noted POA    Renal failure ICD-10-CM: N19  ICD-9-CM: 643  07/03/2019 Unknown        Hyperkalemia ICD-10-CM: E87.5  ICD-9-CM: 276.7  07/03/2019 Unknown        Electrolyte imbalance ICD-10-CM: E87.8  ICD-9-CM: 276.9  07/03/2019 Unknown        AKI (acute kidney injury) ([place]) ICD-10-CM: N17.9  ICD-9-CM: 584.9  07/03/2019 Unknown            BILATERAL HYDRONEPHROSIS:  Now s/p bilateral ureteral stent placement.  Tortuous, dilated ureters with obstruction.  See operative note or HPI for additional op findings.    AKI: admit creatinine was 6.84.  No knowledge of baseline renal function.  Creatinine now 1.97 following bilateral ureteral stent placement.      UTI: marked pyuria on UA without bacteria.  Sludge noted on [place]. Noted bladder inflammation on cystoscopy. Culture with no growth; thought to be false negative.  On meropenem per ID.      HX OF PROSTATE CANCER: unknown disease status. PSA 2.2 on 07/05/19.  We can attempt to obtain records for review.  Can f/u outpatient.    HIV INFECTION: Confirmed diagnosis.  Followed inpatient by ID.    Discussed with Dr. Vevelyn Royals, MD, Attending Physician    Signed By: Graceann Congress, NP     Jul 07, 2019

## 2019-07-07 NOTE — Progress Notes (Signed)
[facility] has started authorization on this patient. We will need updated PT/OT notes.

## 2019-07-07 NOTE — Progress Notes (Addendum)
[facility]     Renal Daily Progress Note:     Admission Date: 07/03/2019     Subjective: feels better, BP remains low but he is asymptomatic, eating. No N/V or abd pain, foley in. Creatinine has dropped nicely after bilateral ureteral stenting.    Review of Systems  Pertinent items are noted in HPI.    Objective:     Visit Vitals  BP 103/67 (BP 1 Location: Right upper arm, BP Patient Position: At rest)   Pulse 77   Temp 97 ??F (36.1 ??C)   Resp 13   Ht 5\' 8"  (1.727 m)   Wt 47.9 kg (105 lb 9.6 oz)   SpO2 100%   BMI 16.06 kg/m??     Temp (24hrs), Avg:97.5 ??F (36.4 ??C), Min:97 ??F (36.1 ??C), Max:98.6 ??F (37 ??C)        Intake/Output Summary (Last 24 hours) at 07/07/2019 2250  Last data filed at 07/07/2019 1830  Gross per 24 hour   Intake 1359 ml   Output 500 ml   Net 859 ml     Current Facility-Administered Medications   Medication Dose Route Frequency   ??? dapsone tablet 100 mg  100 mg Oral DAILY   ??? dolutegravir (TIVICAY) tablet 50 mg  50 mg Oral DAILY   ??? emtricitabine-tenofovir alafen (DESCOVY) 200-25 mg tablet 1 Tab  1 Tab Oral DAILY   ??? 0.45% sodium chloride infusion  100 mL/hr IntraVENous CONTINUOUS   ??? tamsulosin (FLOMAX) capsule 0.4 mg  0.4 mg Oral QHS   ??? sodium bicarbonate tablet 650 mg  650 mg Oral BID   ??? NOREPINephrine (LEVOPHED) 8 mg in 5% dextrose 09/06/2019 (32 mcg/mL) infusion  0.5-30 mcg/min IntraVENous TITRATE   ??? meropenem (MERREM) 500 mg in sterile water (preservative free) 10 mL IV syringe  0.5 g IntraVENous Q12H   ??? insulin lispro (HUMALOG) injection   SubCUTAneous ACB&D   ??? glucose chewable tablet 16 g  4 Tab Oral PRN   ??? dextrose (D50W) injection syrg 12.5-25 g  25-50 mL IntraVENous PRN   ??? glucagon (GLUCAGEN) injection 1 mg  1 mg IntraMUSCular PRN   ??? sodium chloride (NS) flush 5-40 mL  5-40 mL IntraVENous Q8H   ??? sodium chloride (NS) flush 5-40 mL  5-40 mL IntraVENous PRN   ??? acetaminophen (TYLENOL) tablet 650 mg  650 mg Oral Q6H PRN    Or   ??? acetaminophen (TYLENOL) suppository 650 mg  650 mg  Rectal Q6H PRN   ??? polyethylene glycol (MIRALAX) packet 17 g  17 g Oral DAILY PRN   ??? promethazine (PHENERGAN) tablet 12.5 mg  12.5 mg Oral Q6H PRN    Or   ??? ondansetron (ZOFRAN) injection 4 mg  4 mg IntraVENous Q6H PRN   ??? heparin (porcine) injection 5,000 Units  5,000 Units SubCUTAneous Q8H       Physical Exam:General appearance: alert, cooperative, no distress, appears older than stated age  Lungs: clear to auscultation bilaterally  Heart: regular rate and rhythm, no S3 or S4  Abdomen: soft, non-tender. Bowel sounds normal. No masses,  no organomegaly  Extremities: no edema  Neurologic: Grossly normal    Data Review:     LABS:  Recent Labs     07/07/19  0550 07/06/19  0830 07/05/19  0425   NA 138 143 141   140   K 4.8 5.7* 4.2   4.2   CL 111* 115* 114*   114*   CO2 23 25  20*   21   BUN 59* 59* 81*   81*   CREA 1.97* 2.39* 3.56*   3.55*   CA 7.5* 7.6* 7.1*   7.0*   7.1*   ALB 2.0* 2.4* 2.4*   2.4*   PHOS  --   --  3.4   Fe sat 62%  PTH 243,  Vit D2 10.7  PSA 2.2  RPR titer 1:64       Recent Labs     07/07/19  0550 07/06/19  0830 07/05/19  0425   WBC 7.9 5.9 5.2   5.4   HGB 8.0* 9.7* 9.8*   9.8*   HCT 24.8* 30.5* 29.7*   30.3*   PLT 206 224 202   209     No results for input(s): NAU, KU, CLU, CREAU in the last 72 hours.    No lab exists for component: PROU  Retroperitoneal ultrasound Jul 03, 2019:  History: Renal failure  ??  Retroperitoneal ultrasound was performed. Aorta and IVC appear within normal  limits. Right kidney measures 9.4 x 4.8 x 5.5 cm. There is hydronephrosis. Left  kidney measures 9.3 x 5.1 x 5.0 cm. There is hydronephrosis. The bladder is  distended with a sludge fluid level within it. It measures 8.7 x 5.2 x 5.3 cm.  Foley catheter was placed within the bladder during the examination. There is  some decompression of the bladder. Reevaluation of the kidneys demonstrated  hydronephrosis. It is not indicated how long before reevaluation post Foley.  ??  IMPRESSION  Bilateral hydronephrosis likely due  to reflux from distended  bladder.   ??  CXR 07-03-19:  Chest single view.  ??  Emphysema/bullous cysts. Small volume linear reticular markings left lung base.  No gross interstitial or alveolar pulmonary edema. Left side cardiac device with  leads overlying the heart. No pneumothorax or sizable pleural effusion.  Assessment:   Renal Specific Problems  Probable mild chronic kidney disease at baseline  Acute kidney injury related to obstructive uropathy  Bladder outlet obstruction with secondary hydronephrosis  bilateral UVJ obstruction  Hyperkalemia related to AKI- resolved  Anemia probably related to renal insufficiency, rule out iron deficiency  Metabolic acidosis  Hyperphosphatemia  Hypoalbuminemia  Unexplained weight loss  History of prostate cancer???treatment protocol not clear  Hypotension  HIV  ? Latent syphilis      Plan:     Obtain/ Order: labs/cultures/radiology/procedures:  daiily Renal fxn, awaiting PSA    Iron sat    Therapeutic:    decrease IVF to 50 cc/hrOral NaHCO3  Ensure  Start epogen  Start Vit D2 replacement (ergocalciferol)    Gardiner Coins, MD    [phone]

## 2019-07-07 NOTE — Progress Notes (Signed)
General Daily Progress Note          Patient Name:   Wesley Clark       Date of Birth:   [birth year]       Age:  64 y.o.      Admit Date: 07/03/2019      Subjective:       Patient alert awake denies any chest pain or shortness of breath     Patient have cystoscopy done and bilateral ureteral stent placed    Hypotension on Levophed      Objective:     Visit Vitals  BP 111/82 (BP 1 Location: Right upper arm, BP Patient Position: At rest)   Pulse 85   Temp 97.2 ??F (36.2 ??C)   Resp 14   Ht 5' 8"  (1.727 m)   Wt 47.9 kg (105 lb 9.6 oz)   SpO2 100%   BMI 16.06 kg/m??        Recent Results (from the past 24 hour(s))   GLUCOSE, POC    Collection Time: 07/06/19  3:54 PM   Result Value Ref Range    Glucose (POC) 167 (H) 65 - 100 mg/dL    Performed by LONG SARAH    CBC WITH AUTOMATED DIFF    Collection Time: 07/07/19  5:50 AM   Result Value Ref Range    WBC 7.9 4.1 - 11.1 K/uL    RBC 2.70 (L) 4.10 - 5.70 M/uL    HGB 8.0 (L) 12.1 - 17.0 g/dL    HCT 24.8 (L) 36.6 - 50.3 %    MCV 91.9 80.0 - 99.0 FL    MCH 29.6 26.0 - 34.0 PG    MCHC 32.3 30.0 - 36.5 g/dL    RDW 13.8 11.5 - 14.5 %    PLATELET 206 150 - 400 K/uL    MPV 10.6 8.9 - 12.9 FL    NRBC 0.0 0.0 PER 100 WBC    ABSOLUTE NRBC 0.00 0.00 - 0.01 K/uL    NEUTROPHILS 81 (H) 32 - 75 %    LYMPHOCYTES 13 12 - 49 %    MONOCYTES 5 5 - 13 %    EOSINOPHILS 0 0 - 7 %    BASOPHILS 0 0 - 1 %    IMMATURE GRANULOCYTES 1 (H) 0 - 0.5 %    ABS. NEUTROPHILS 6.5 1.8 - 8.0 K/UL    ABS. LYMPHOCYTES 1.0 0.8 - 3.5 K/UL    ABS. MONOCYTES 0.4 0.0 - 1.0 K/UL    ABS. EOSINOPHILS 0.0 0.0 - 0.4 K/UL    ABS. BASOPHILS 0.0 0.0 - 0.1 K/UL    ABS. IMM. GRANS. 0.0 0.00 - 0.04 K/UL    DF AUTOMATED     METABOLIC PANEL, COMPREHENSIVE    Collection Time: 07/07/19  5:50 AM   Result Value Ref Range    Sodium 138 136 - 145 mmol/L    Potassium 4.8 3.5 - 5.1 mmol/L    Chloride 111 (H) 97 - 108 mmol/L    CO2 23 21 - 32 mmol/L    Anion gap 4 (L) 5 - 15 mmol/L    Glucose 117 (H) 65 - 100 mg/dL    BUN 59 (H) 6 - 20 mg/dL     Creatinine 1.97 (H) 0.70 - 1.30 mg/dL    BUN/Creatinine ratio 30 (H) 12 - 20      GFR est AA 42 (L) >60 ml/min/1.12m    GFR est non-AA 34 (L) >60 ml/min/1.759m  Calcium 7.5 (L) 8.5 - 10.1 mg/dL    Bilirubin, total 0.2 0.2 - 1.0 mg/dL    AST (SGOT) 7 (L) 15 - 37 U/L    ALT (SGPT) 16 12 - 78 U/L    Alk. phosphatase 58 45 - 117 U/L    Protein, total 6.0 (L) 6.4 - 8.2 g/dL    Albumin 2.0 (L) 3.5 - 5.0 g/dL    Globulin 4.0 2.0 - 4.0 g/dL    A-G Ratio 0.5 (L) 1.1 - 2.2     GLUCOSE, POC    Collection Time: 07/07/19 12:01 PM   Result Value Ref Range    Glucose (POC) 123 (H) 65 - 100 mg/dL    Performed by Michael Litter      @[email or web address] @      Review of Systems    Constitutional: Negative for chills and fever.   HENT: Negative.    Eyes: Negative.    Respiratory: Negative.    Cardiovascular: Negative.    Gastrointestinal: Negative for abdominal pain and nausea.   Skin: Negative.    Neurological: Negative.        Physical Exam:      Constitutional: pt is oriented to person, place, and time.   HENT:   Head: Normocephalic and atraumatic.   Eyes: Pupils are equal, round, and reactive to light. EOM are normal.   Cardiovascular: Normal rate, regular rhythm and normal heart sounds.   Pulmonary/Chest: Breath sounds normal. No wheezes. No rales.   Exhibits no tenderness.   Abdominal: Soft. Bowel sounds are normal. There is no abdominal tenderness. There is no rebound and no guarding.   Musculoskeletal: Normal range of motion.   Neurological: pt is alert and oriented to person, place, and time.     XR FLUOROSCOPY UNDER 60 MINUTES   Final Result      [place] RETROPERITONEUM COMP   Final Result   1. Bilateral moderate hydronephrosis, similar to previous.   2. Increased left renal echotexture from medical renal disease.      [place] RETROPERITONEUM COMP   Final Result   Bilateral hydronephrosis likely due to reflux from distended   bladder.       XR CHEST SNGL V   Final Result      CT ABD PELV WO CONT    (Results Pending)        Recent Results  (from the past 24 hour(s))   GLUCOSE, POC    Collection Time: 07/06/19  3:54 PM   Result Value Ref Range    Glucose (POC) 167 (H) 65 - 100 mg/dL    Performed by LONG SARAH    CBC WITH AUTOMATED DIFF    Collection Time: 07/07/19  5:50 AM   Result Value Ref Range    WBC 7.9 4.1 - 11.1 K/uL    RBC 2.70 (L) 4.10 - 5.70 M/uL    HGB 8.0 (L) 12.1 - 17.0 g/dL    HCT 24.8 (L) 36.6 - 50.3 %    MCV 91.9 80.0 - 99.0 FL    MCH 29.6 26.0 - 34.0 PG    MCHC 32.3 30.0 - 36.5 g/dL    RDW 13.8 11.5 - 14.5 %    PLATELET 206 150 - 400 K/uL    MPV 10.6 8.9 - 12.9 FL    NRBC 0.0 0.0 PER 100 WBC    ABSOLUTE NRBC 0.00 0.00 - 0.01 K/uL    NEUTROPHILS 81 (H) 32 - 75 %    LYMPHOCYTES 13 12 -  49 %    MONOCYTES 5 5 - 13 %    EOSINOPHILS 0 0 - 7 %    BASOPHILS 0 0 - 1 %    IMMATURE GRANULOCYTES 1 (H) 0 - 0.5 %    ABS. NEUTROPHILS 6.5 1.8 - 8.0 K/UL    ABS. LYMPHOCYTES 1.0 0.8 - 3.5 K/UL    ABS. MONOCYTES 0.4 0.0 - 1.0 K/UL    ABS. EOSINOPHILS 0.0 0.0 - 0.4 K/UL    ABS. BASOPHILS 0.0 0.0 - 0.1 K/UL    ABS. IMM. GRANS. 0.0 0.00 - 0.04 K/UL    DF AUTOMATED     METABOLIC PANEL, COMPREHENSIVE    Collection Time: 07/07/19  5:50 AM   Result Value Ref Range    Sodium 138 136 - 145 mmol/L    Potassium 4.8 3.5 - 5.1 mmol/L    Chloride 111 (H) 97 - 108 mmol/L    CO2 23 21 - 32 mmol/L    Anion gap 4 (L) 5 - 15 mmol/L    Glucose 117 (H) 65 - 100 mg/dL    BUN 59 (H) 6 - 20 mg/dL    Creatinine 1.97 (H) 0.70 - 1.30 mg/dL    BUN/Creatinine ratio 30 (H) 12 - 20      GFR est AA 42 (L) >60 ml/min/1.60m    GFR est non-AA 34 (L) >60 ml/min/1.734m   Calcium 7.5 (L) 8.5 - 10.1 mg/dL    Bilirubin, total 0.2 0.2 - 1.0 mg/dL    AST (SGOT) 7 (L) 15 - 37 U/L    ALT (SGPT) 16 12 - 78 U/L    Alk. phosphatase 58 45 - 117 U/L    Protein, total 6.0 (L) 6.4 - 8.2 g/dL    Albumin 2.0 (L) 3.5 - 5.0 g/dL    Globulin 4.0 2.0 - 4.0 g/dL    A-G Ratio 0.5 (L) 1.1 - 2.2     GLUCOSE, POC    Collection Time: 07/07/19 12:01 PM   Result Value Ref Range    Glucose (POC) 123 (H) 65 - 100  mg/dL    Performed by WAMichael Litter      Results     Procedure Component Value Units Date/Time    MRSA SCREEN - PCR (NASAL) [[id number]]ollected: 07/04/19 1543    Order Status: Completed Specimen: Swab Updated: 07/04/19 2203     MRSA by PCR, Nasal Not Detected       COVID-19 RAPID TEST [[id number]]ollected: 07/04/19 0630    Order Status: Completed Specimen: Nasopharyngeal Updated: 07/04/19 0738     Specimen source Nasopharyngeal        COVID-19 rapid test Not Detected        Comment: Rapid Abbott ID Now   Rapid NAAT:  The specimen is NEGATIVE for SARS-CoV-2, the novel coronavirus associated with COVID-19.   Negative results should be treated as presumptive and, if inconsistent with clinical signs and symptoms or necessary for patient management, should be tested with an alternative molecular assay. Negative results do not preclude SARS-CoV-2 infection and should not be used as the sole basis for patient management decisions.   This test has been authorized by the FDA under   an Emergency Use Authorization (EUA) for use by authorized laboratories. Fact sheet for Healthcare Providers: htLittleDVDs.dkact sheet for Patients: htSatelliteRebate.it Methodology: Isothermal Nucleic Acid Amplification         CULTURE, URINE [[id number]: 07/03/19 1615    Order Status:  Completed Specimen: Urine Updated: 07/05/19 0838     Special Requests: --        No Special Requests  Reflexed from [id number]       Culture result: No Growth (<1000 cfu/mL)              Labs:     Recent Labs     07/07/19  0550 07/06/19  0830   WBC 7.9 5.9   HGB 8.0* 9.7*   HCT 24.8* 30.5*   PLT 206 224     Recent Labs     07/07/19  0550 07/06/19  0830 07/05/19  0425   NA 138 143 141   140   K 4.8 5.7* 4.2   4.2   CL 111* 115* 114*   114*   CO2 23 25 20*   21   BUN 59* 59* 81*   81*   CREA 1.97* 2.39* 3.56*   3.55*   GLU 117* 84 103*   103*   CA 7.5* 7.6* 7.1*   7.0*   7.1*   PHOS  --   --  3.4      Recent Labs     07/07/19  0550 07/06/19  0830 07/05/19  0425   ALT 16 20 8*   AP 58 69 58   TBILI 0.2 0.3 0.2   TP 6.0* 6.9 7.0   ALB 2.0* 2.4* 2.4*   2.4*   GLOB 4.0 4.5* 4.6*     No results for input(s): INR, PTP, APTT, INREXT, INREXT in the last 72 hours.   Recent Labs     07/06/19  0830 07/05/19  0425   TIBC 178* 178*   PSAT 46 62*      No results found for: FOL, RBCF   No results for input(s): PH, PCO2, PO2 in the last 72 hours.  No results for input(s): CPK, CKNDX, TROIQ in the last 72 hours.    No lab exists for component: CPKMB  No results found for: CHOL, CHOLX, CHLST, CHOLV, HDL, HDLP, LDL, LDLC, DLDLP, TGLX, TRIGL, TRIGP, CHHD, CHHDX  Lab Results   Component Value Date/Time    Glucose (POC) 123 (H) 07/07/2019 12:01 PM    Glucose (POC) 167 (H) 07/06/2019 03:54 PM    Glucose (POC) 118 (H) 07/05/2019 03:18 PM    Glucose (POC) 130 (H) 07/05/2019 11:14 AM    Glucose (POC) 139 (H) 07/04/2019 09:33 PM     Lab Results   Component Value Date/Time    Color Yellow/Straw 07/03/2019 04:15 PM    Appearance Turbid (A) 07/03/2019 04:15 PM    Specific gravity 1.008 07/03/2019 04:15 PM    pH (UA) 6.0 07/03/2019 04:15 PM    Protein 100 (A) 07/03/2019 04:15 PM    Glucose 50 (A) 07/03/2019 04:15 PM    Ketone Negative 07/03/2019 04:15 PM    Bilirubin Negative 07/03/2019 04:15 PM    Urobilinogen 0.1 07/03/2019 04:15 PM    Nitrites Negative 07/03/2019 04:15 PM    Leukocyte Esterase Moderate (A) 07/03/2019 04:15 PM    Bacteria Negative 07/03/2019 04:15 PM    WBC >100 (H) 07/03/2019 04:15 PM    RBC >100 (H) 07/03/2019 04:15 PM         Assessment:     Acute kidney injury  Bilateral hydronephrosis s/p cystoscopy bilateral ureteral stent placement  Hypotension  Urinary retention  Protein calorie malnutrition moderate  Electrolyte disturbances??-hyponatremia, hyperkalemia, hyperphosphatemia, hypermagnesemia  Decreased mobility  History of cardiac disease and  pacemaker  History of high blood pressure  History of prostate cancer   Abnormal chest x-ray suggestive of emphysema??-patient does not currently smoke  Cachexia  Hyperkalemia  Hemoglobin dropped to 8.0      Plan:     On IV meropenem  Subcu heparin 5000 every 8 hours  Flomax 0.4 daily  On Levophed for hypotension  Lokelma 10 g 1 dose    Repeat H&H  Order stool for occult blood and anemia work-up    ID and renal consult    Repeat the labs in the morning and transferred to the medical telemetry floor        Current Facility-Administered Medications:   ???  dapsone tablet 100 mg, 100 mg, Oral, DAILY, Vallarie Mare, MD  ???  dolutegravir (TIVICAY) tablet 50 mg, 50 mg, Oral, DAILY, Vallarie Mare, MD  ???  emtricitabine-tenofovir alafen (DESCOVY) 200-25 mg tablet 1 Tab, 1 Tab, Oral, DAILY, Vallarie Mare, MD  ???  0.45% sodium chloride infusion, 100 mL/hr, IntraVENous, CONTINUOUS, Gerhard Perches, MD, Last Rate: 100 mL/hr at 07/06/19 2346, 100 mL/hr at 07/06/19 2346  ???  tamsulosin (FLOMAX) capsule 0.4 mg, 0.4 mg, Oral, QHS, Gerhard Perches, MD, 0.4 mg at 07/06/19 2125  ???  sodium bicarbonate tablet 650 mg, 650 mg, Oral, BID, Gerhard Perches, MD, 650 mg at 07/07/19 3086  ???  NOREPINephrine (LEVOPHED) 8 mg in 5% dextrose 237m (32 mcg/mL) infusion, 0.5-30 mcg/min, IntraVENous, TITRATE, Anguel Delapena, AMarcy Siren MD, Stopped at 07/05/19 1642  ???  meropenem (MERREM) 500 mg in sterile water (preservative free) 10 mL IV syringe, 0.5 g, IntraVENous, Q12H, HVallarie Mare MD, 500 mg at 07/07/19 [id number] ???  insulin lispro (HUMALOG) injection, , SubCUTAneous, ACB&D, RDemetrios Loll NP, Stopped at 07/04/19 0730  ???  glucose chewable tablet 16 g, 4 Tab, Oral, PRN, RDemetrios Loll NP  ???  dextrose (D50W) injection syrg 12.5-25 g, 25-50 mL, IntraVENous, PRN, RDemetrios Loll NP  ???  glucagon (GLUCAGEN) injection 1 mg, 1 mg, IntraMUSCular, PRN, RDemetrios Loll NP  ???  sodium chloride (NS) flush 5-40 mL, 5-40 mL, IntraVENous, Q8H, RDemetrios Loll NP, 10 mL at 07/07/19 0650  ???  sodium chloride (NS) flush 5-40 mL, 5-40  mL, IntraVENous, PRN, RDemetrios Loll NP  ???  acetaminophen (TYLENOL) tablet 650 mg, 650 mg, Oral, Q6H PRN **OR** acetaminophen (TYLENOL) suppository 650 mg, 650 mg, Rectal, Q6H PRN, RDemetrios Loll NP  ???  polyethylene glycol (MIRALAX) packet 17 g, 17 g, Oral, DAILY PRN, RDemetrios Loll NP  ???  promethazine (PHENERGAN) tablet 12.5 mg, 12.5 mg, Oral, Q6H PRN **OR** ondansetron (ZOFRAN) injection 4 mg, 4 mg, IntraVENous, Q6H PRN, RDemetrios Loll NP  ???  heparin (porcine) injection 5,000 Units, 5,000 Units, SubCUTAneous, Q8H, RDemetrios Loll NP, 5,000 Units at 07/07/19 [phone]

## 2019-07-07 NOTE — Progress Notes (Signed)
SBAR report given to Kim, RN.

## 2019-07-08 ENCOUNTER — Inpatient Hospital Stay: Admit: 2019-07-08 | Payer: MEDICARE | Primary: Internal Medicine

## 2019-07-08 LAB — METABOLIC PANEL, COMPREHENSIVE
A-G Ratio: 0.5 — ABNORMAL LOW (ref 1.1–2.2)
ALT (SGPT): 17 U/L (ref 12–78)
AST (SGOT): 12 U/L — ABNORMAL LOW (ref 15–37)
Albumin: 2 g/dL — ABNORMAL LOW (ref 3.5–5.0)
Alk. phosphatase: 55 U/L (ref 45–117)
Anion gap: 7 mmol/L (ref 5–15)
BUN/Creatinine ratio: 28 — ABNORMAL HIGH (ref 12–20)
BUN: 50 mg/dL — ABNORMAL HIGH (ref 6–20)
Bilirubin, total: 0.2 mg/dL (ref 0.2–1.0)
CO2: 22 mmol/L (ref 21–32)
Calcium: 7.6 mg/dL — ABNORMAL LOW (ref 8.5–10.1)
Chloride: 112 mmol/L — ABNORMAL HIGH (ref 97–108)
Creatinine: 1.8 mg/dL — ABNORMAL HIGH (ref 0.70–1.30)
GFR est AA: 46 mL/min/{1.73_m2} — ABNORMAL LOW (ref >60)
GFR est non-AA: 38 mL/min/{1.73_m2} — ABNORMAL LOW (ref >60)
Globulin: 4 g/dL (ref 2.0–4.0)
Glucose: 129 mg/dL — ABNORMAL HIGH (ref 65–100)
Potassium: 3.9 mmol/L (ref 3.5–5.1)
Protein, total: 6 g/dL — ABNORMAL LOW (ref 6.4–8.2)
Sodium: 141 mmol/L (ref 136–145)

## 2019-07-08 LAB — CBC WITH AUTOMATED DIFF
ABS. BASOPHILS: 0 10*3/uL (ref 0.0–0.1)
ABS. EOSINOPHILS: 0.6 10*3/uL — ABNORMAL HIGH (ref 0.0–0.4)
ABS. IMM. GRANS.: 0 10*3/uL (ref 0.00–0.04)
ABS. LYMPHOCYTES: 1.1 10*3/uL (ref 0.8–3.5)
ABS. MONOCYTES: 0.5 10*3/uL (ref 0.0–1.0)
ABS. NEUTROPHILS: 6.5 10*3/uL (ref 1.8–8.0)
ABSOLUTE NRBC: 0 10*3/uL (ref 0.00–0.01)
BASOPHILS: 0 % (ref 0–1)
EOSINOPHILS: 7 % (ref 0–7)
HCT: 28.7 % — ABNORMAL LOW (ref 36.6–50.3)
HGB: 9.1 g/dL — ABNORMAL LOW (ref 12.1–17.0)
IMMATURE GRANULOCYTES: 1 % — ABNORMAL HIGH (ref 0–0.5)
LYMPHOCYTES: 12 % (ref 12–49)
MCH: 30.2 PG (ref 26.0–34.0)
MCHC: 31.7 g/dL (ref 30.0–36.5)
MCV: 95.3 FL (ref 80.0–99.0)
MONOCYTES: 6 % (ref 5–13)
MPV: 10.3 FL (ref 8.9–12.9)
NEUTROPHILS: 74 % (ref 32–75)
NRBC: 0 PER 100 WBC
PLATELET: 227 10*3/uL (ref 150–400)
RBC: 3.01 M/uL — ABNORMAL LOW (ref 4.10–5.70)
RDW: 14 % (ref 11.5–14.5)
WBC: 8.7 10*3/uL (ref 4.1–11.1)

## 2019-07-08 LAB — IRON PROFILE
Iron % saturation: 47 % (ref 20–50)
Iron: 81 ug/dL (ref 35–150)
TIBC: 174 ug/dL — ABNORMAL LOW (ref 250–450)

## 2019-07-08 LAB — C REACTIVE PROTEIN, QT: C-Reactive protein: 0.82 mg/dL — ABNORMAL HIGH (ref 0.00–0.60)

## 2019-07-08 LAB — PROCALCITONIN: Procalcitonin: 0.17 ng/mL — ABNORMAL HIGH

## 2019-07-08 MED ORDER — ERGOCALCIFEROL (VITAMIN D2) 50,000 UNIT CAP
1250 mcg (50,000 unit) | ORAL | Status: DC
Start: 2019-07-08 — End: 2019-07-12
  Administered 2019-07-08: 14:00:00 via ORAL

## 2019-07-08 MED ORDER — EPOETIN ALFA-EPBX 10,000 UNIT/ML INJECTION SOLUTION
10000 unit/mL | INTRAMUSCULAR | Status: DC
Start: 2019-07-08 — End: 2019-07-12
  Administered 2019-07-08: 16:00:00 via SUBCUTANEOUS

## 2019-07-08 MED FILL — DESCOVY 200 MG-25 MG TABLET: 200-25 mg | ORAL | Qty: 1

## 2019-07-08 MED FILL — SODIUM BICARBONATE 650 MG TAB: 650 mg | ORAL | Qty: 1

## 2019-07-08 MED FILL — DAPSONE 25 MG TAB: 25 mg | ORAL | Qty: 4

## 2019-07-08 MED FILL — MEROPENEM 500 MG IV SOLR: 500 mg | INTRAVENOUS | Qty: 500

## 2019-07-08 MED FILL — VITAMIN D2 1,250 MCG (50,000 UNIT) CAPSULE: 1250 mcg (50,000 unit) | ORAL | Qty: 1

## 2019-07-08 MED FILL — HEPARIN (PORCINE) 5,000 UNIT/ML IJ SOLN: 5000 unit/mL | INTRAMUSCULAR | Qty: 1

## 2019-07-08 MED FILL — TAMSULOSIN SR 0.4 MG 24 HR CAP: 0.4 mg | ORAL | Qty: 1

## 2019-07-08 MED FILL — RETACRIT 10,000 UNIT/ML INJECTION SOLUTION: 10000 unit/mL | INTRAMUSCULAR | Qty: 1

## 2019-07-08 MED FILL — TIVICAY 50 MG TABLET: 50 mg | ORAL | Qty: 1

## 2019-07-08 NOTE — Progress Notes (Signed)
General Daily Progress Note          Patient Name:   Wesley Clark       Date of Birth:   [birth year]       Age:  64 y.o.      Admit Date: 07/03/2019      Subjective:       Patient alert awake denies any chest pain or shortness of breath     Patient have cystoscopy done and bilateral ureteral stent placed    Hypotension off  Levophed      Objective:     Visit Vitals  BP 99/61   Pulse 77   Temp 97.5 ??F (36.4 ??C)   Resp 18   Ht 5' 8"  (1.727 m)   Wt 47.9 kg (105 lb 9.6 oz)   SpO2 97%   BMI 16.06 kg/m??        Recent Results (from the past 24 hour(s))   GLUCOSE, POC    Collection Time: 07/07/19  4:18 PM   Result Value Ref Range    Glucose (POC) 105 (H) 65 - 100 mg/dL    Performed by Michael Litter    PROCALCITONIN    Collection Time: 07/08/19 12:00 PM   Result Value Ref Range    Procalcitonin 0.17 (H) 0 ng/mL   C REACTIVE PROTEIN, QT    Collection Time: 07/08/19 12:00 PM   Result Value Ref Range    C-Reactive protein 0.82 (H) 0.00 - 0.60 mg/dL   CBC WITH AUTOMATED DIFF    Collection Time: 07/08/19 12:00 PM   Result Value Ref Range    WBC 8.7 4.1 - 11.1 K/uL    RBC 3.01 (L) 4.10 - 5.70 M/uL    HGB 9.1 (L) 12.1 - 17.0 g/dL    HCT 28.7 (L) 36.6 - 50.3 %    MCV 95.3 80.0 - 99.0 FL    MCH 30.2 26.0 - 34.0 PG    MCHC 31.7 30.0 - 36.5 g/dL    RDW 14.0 11.5 - 14.5 %    PLATELET 227 150 - 400 K/uL    MPV 10.3 8.9 - 12.9 FL    NRBC 0.0 0.0 PER 100 WBC    ABSOLUTE NRBC 0.00 0.00 - 0.01 K/uL    NEUTROPHILS 74 32 - 75 %    LYMPHOCYTES 12 12 - 49 %    MONOCYTES 6 5 - 13 %    EOSINOPHILS 7 0 - 7 %    BASOPHILS 0 0 - 1 %    IMMATURE GRANULOCYTES 1 (H) 0 - 0.5 %    ABS. NEUTROPHILS 6.5 1.8 - 8.0 K/UL    ABS. LYMPHOCYTES 1.1 0.8 - 3.5 K/UL    ABS. MONOCYTES 0.5 0.0 - 1.0 K/UL    ABS. EOSINOPHILS 0.6 (H) 0.0 - 0.4 K/UL    ABS. BASOPHILS 0.0 0.0 - 0.1 K/UL    ABS. IMM. GRANS. 0.0 0.00 - 0.04 K/UL    DF AUTOMATED     METABOLIC PANEL, COMPREHENSIVE    Collection Time: 07/08/19 12:00 PM   Result Value Ref Range    Sodium 141 136 - 145 mmol/L     Potassium 3.9 3.5 - 5.1 mmol/L    Chloride 112 (H) 97 - 108 mmol/L    CO2 22 21 - 32 mmol/L    Anion gap 7 5 - 15 mmol/L    Glucose 129 (H) 65 - 100 mg/dL    BUN 50 (H) 6 - 20 mg/dL  Creatinine 1.80 (H) 0.70 - 1.30 mg/dL    BUN/Creatinine ratio 28 (H) 12 - 20      GFR est AA 46 (L) >60 ml/min/1.57m    GFR est non-AA 38 (L) >60 ml/min/1.761m   Calcium 7.6 (L) 8.5 - 10.1 mg/dL    Bilirubin, total 0.2 0.2 - 1.0 mg/dL    AST (SGOT) 12 (L) 15 - 37 U/L    ALT (SGPT) 17 12 - 78 U/L    Alk. phosphatase 55 45 - 117 U/L    Protein, total 6.0 (L) 6.4 - 8.2 g/dL    Albumin 2.0 (L) 3.5 - 5.0 g/dL    Globulin 4.0 2.0 - 4.0 g/dL    A-G Ratio 0.5 (L) 1.1 - 2.2       @LABCOMPINFO @      Review of Systems    Constitutional: Negative for chills and fever.   HENT: Negative.    Eyes: Negative.    Respiratory: Negative.    Cardiovascular: Negative.    Gastrointestinal: Negative for abdominal pain and nausea.   Skin: Negative.    Neurological: Negative.        Physical Exam:      Constitutional: pt is oriented to person, place, and time.   HENT:   Head: Normocephalic and atraumatic.   Eyes: Pupils are equal, round, and reactive to light. EOM are normal.   Cardiovascular: Normal rate, regular rhythm and normal heart sounds.   Pulmonary/Chest: Breath sounds normal. No wheezes. No rales.   Exhibits no tenderness.   Abdominal: Soft. Bowel sounds are normal. There is no abdominal tenderness. There is no rebound and no guarding.   Musculoskeletal: Normal range of motion.   Neurological: pt is alert and oriented to person, place, and time.     CT ABD PELV WO CONT   Final Result      Bilateral ureteric stents. Bilateral hydronephrosis.      XR FLUOROSCOPY UNDER 60 MINUTES   Final Result      [place]ETROPERITONEUM COMP   Final Result   1. Bilateral moderate hydronephrosis, similar to previous.   2. Increased left renal echotexture from medical renal disease.      [place]OPERITONEUM COMP   Final Result   Bilateral hydronephrosis likely due to  reflux from distended   bladder.       XR CHEST SNGL V   Final Result           Recent Results (from the past 24 hour(s))   GLUCOSE, POC    Collection Time: 07/07/19  4:18 PM   Result Value Ref Range    Glucose (POC) 105 (H) 65 - 100 mg/dL    Performed by WAMichael Litter  PROCALCITONIN    Collection Time: 07/08/19 12:00 PM   Result Value Ref Range    Procalcitonin 0.17 (H) 0 ng/mL   C REACTIVE PROTEIN, QT    Collection Time: 07/08/19 12:00 PM   Result Value Ref Range    C-Reactive protein 0.82 (H) 0.00 - 0.60 mg/dL   CBC WITH AUTOMATED DIFF    Collection Time: 07/08/19 12:00 PM   Result Value Ref Range    WBC 8.7 4.1 - 11.1 K/uL    RBC 3.01 (L) 4.10 - 5.70 M/uL    HGB 9.1 (L) 12.1 - 17.0 g/dL    HCT 28.7 (L) 36.6 - 50.3 %    MCV 95.3 80.0 - 99.0 FL    MCH 30.2 26.0 - 34.0  PG    MCHC 31.7 30.0 - 36.5 g/dL    RDW 14.0 11.5 - 14.5 %    PLATELET 227 150 - 400 K/uL    MPV 10.3 8.9 - 12.9 FL    NRBC 0.0 0.0 PER 100 WBC    ABSOLUTE NRBC 0.00 0.00 - 0.01 K/uL    NEUTROPHILS 74 32 - 75 %    LYMPHOCYTES 12 12 - 49 %    MONOCYTES 6 5 - 13 %    EOSINOPHILS 7 0 - 7 %    BASOPHILS 0 0 - 1 %    IMMATURE GRANULOCYTES 1 (H) 0 - 0.5 %    ABS. NEUTROPHILS 6.5 1.8 - 8.0 K/UL    ABS. LYMPHOCYTES 1.1 0.8 - 3.5 K/UL    ABS. MONOCYTES 0.5 0.0 - 1.0 K/UL    ABS. EOSINOPHILS 0.6 (H) 0.0 - 0.4 K/UL    ABS. BASOPHILS 0.0 0.0 - 0.1 K/UL    ABS. IMM. GRANS. 0.0 0.00 - 0.04 K/UL    DF AUTOMATED     METABOLIC PANEL, COMPREHENSIVE    Collection Time: 07/08/19 12:00 PM   Result Value Ref Range    Sodium 141 136 - 145 mmol/L    Potassium 3.9 3.5 - 5.1 mmol/L    Chloride 112 (H) 97 - 108 mmol/L    CO2 22 21 - 32 mmol/L    Anion gap 7 5 - 15 mmol/L    Glucose 129 (H) 65 - 100 mg/dL    BUN 50 (H) 6 - 20 mg/dL    Creatinine 1.80 (H) 0.70 - 1.30 mg/dL    BUN/Creatinine ratio 28 (H) 12 - 20      GFR est AA 46 (L) >60 ml/min/1.35m    GFR est non-AA 38 (L) >60 ml/min/1.720m   Calcium 7.6 (L) 8.5 - 10.1 mg/dL    Bilirubin, total 0.2 0.2 - 1.0 mg/dL    AST  (SGOT) 12 (L) 15 - 37 U/L    ALT (SGPT) 17 12 - 78 U/L    Alk. phosphatase 55 45 - 117 U/L    Protein, total 6.0 (L) 6.4 - 8.2 g/dL    Albumin 2.0 (L) 3.5 - 5.0 g/dL    Globulin 4.0 2.0 - 4.0 g/dL    A-G Ratio 0.5 (L) 1.1 - 2.2         Results     Procedure Component Value Units Date/Time    MRSA SCREEN - PCR (NASAL) [[id number]]ollected: 07/04/19 1543    Order Status: Completed Specimen: Swab Updated: 07/04/19 2203     MRSA by PCR, Nasal Not Detected       COVID-19 RAPID TEST [[id number]]ollected: 07/04/19 0630    Order Status: Completed Specimen: Nasopharyngeal Updated: 07/04/19 0738     Specimen source Nasopharyngeal        COVID-19 rapid test Not Detected        Comment: Rapid Abbott ID Now   Rapid NAAT:  The specimen is NEGATIVE for SARS-CoV-2, the novel coronavirus associated with COVID-19.   Negative results should be treated as presumptive and, if inconsistent with clinical signs and symptoms or necessary for patient management, should be tested with an alternative molecular assay. Negative results do not preclude SARS-CoV-2 infection and should not be used as the sole basis for patient management decisions.   This test has been authorized by the FDA under   an Emergency Use Authorization (EUA) for use by authorized laboratories. Fact sheet for  Healthcare Providers: LittleDVDs.dk Fact sheet for Patients: SatelliteRebate.it   Methodology: Isothermal Nucleic Acid Amplification         CULTURE, URINE [[id number]] Collected: 07/03/19 1615    Order Status: Completed Specimen: Urine Updated: 07/05/19 0838     Special Requests: --        No Special Requests  Reflexed from [id number]       Culture result: No Growth (<1000 cfu/mL)              Labs:     Recent Labs     07/08/19  1200 07/07/19  0550   WBC 8.7 7.9   HGB 9.1* 8.0*   HCT 28.7* 24.8*   PLT 227 206     Recent Labs     07/08/19  1200 07/07/19  0550 07/06/19  0830   NA 141 138 143   K 3.9 4.8 5.7*   CL 112*  111* 115*   CO2 22 23 25    BUN 50* 59* 59*   CREA 1.80* 1.97* 2.39*   GLU 129* 117* 84   CA 7.6* 7.5* 7.6*     Recent Labs     07/08/19  1200 07/07/19  0550 07/06/19  0830   ALT 17 16 20    AP 55 58 69   TBILI 0.2 0.2 0.3   TP 6.0* 6.0* 6.9   ALB 2.0* 2.0* 2.4*   GLOB 4.0 4.0 4.5*     No results for input(s): INR, PTP, APTT, INREXT, INREXT in the last 72 hours.   Recent Labs     07/06/19  0830   TIBC 178*   PSAT 46      No results found for: FOL, RBCF   No results for input(s): PH, PCO2, PO2 in the last 72 hours.  No results for input(s): CPK, CKNDX, TROIQ in the last 72 hours.    No lab exists for component: CPKMB  No results found for: CHOL, CHOLX, CHLST, CHOLV, HDL, HDLP, LDL, LDLC, DLDLP, TGLX, TRIGL, TRIGP, CHHD, CHHDX  Lab Results   Component Value Date/Time    Glucose (POC) 105 (H) 07/07/2019 04:18 PM    Glucose (POC) 123 (H) 07/07/2019 12:01 PM    Glucose (POC) 167 (H) 07/06/2019 03:54 PM    Glucose (POC) 118 (H) 07/05/2019 03:18 PM    Glucose (POC) 130 (H) 07/05/2019 11:14 AM     Lab Results   Component Value Date/Time    Color Yellow/Straw 07/03/2019 04:15 PM    Appearance Turbid (A) 07/03/2019 04:15 PM    Specific gravity 1.008 07/03/2019 04:15 PM    pH (UA) 6.0 07/03/2019 04:15 PM    Protein 100 (A) 07/03/2019 04:15 PM    Glucose 50 (A) 07/03/2019 04:15 PM    Ketone Negative 07/03/2019 04:15 PM    Bilirubin Negative 07/03/2019 04:15 PM    Urobilinogen 0.1 07/03/2019 04:15 PM    Nitrites Negative 07/03/2019 04:15 PM    Leukocyte Esterase Moderate (A) 07/03/2019 04:15 PM    Bacteria Negative 07/03/2019 04:15 PM    WBC >100 (H) 07/03/2019 04:15 PM    RBC >100 (H) 07/03/2019 04:15 PM         Assessment:     Acute kidney injury  Bilateral hydronephrosis s/p cystoscopy bilateral ureteral stent placement  Hypotension  Urinary retention  Protein calorie malnutrition moderate  Electrolyte disturbances??-hyponatremia, hyperkalemia, hyperphosphatemia, hypermagnesemia  Decreased mobility  History of cardiac disease  and pacemaker  History of high blood pressure  History of  prostate cancer  Abnormal chest x-ray suggestive of emphysema??-patient does not currently smoke  Cachexia  Hyperkalemia  Anemia hemoglobin 9.1  HIV disease  Reactive RPR/follow with infectious disease    Plan:         On IV meropenem  Subcu heparin 5000 every 8 hours  Flomax 0.4 daily  Lokelma 10 g 1 dose    Repeat H&H      ID and renal consult    Repeat the labs in the morning and transferred to the medical telemetry floor        Current Facility-Administered Medications:   ???  dapsone tablet 100 mg, 100 mg, Oral, DAILY, Vallarie Mare, MD, 100 mg at 07/08/19 1213  ???  dolutegravir (TIVICAY) tablet 50 mg, 50 mg, Oral, DAILY, Vallarie Mare, MD, 50 mg at 07/08/19 1213  ???  emtricitabine-tenofovir alafen (DESCOVY) 200-25 mg tablet 1 Tab, 1 Tab, Oral, DAILY, Vallarie Mare, MD, 1 Tab at 07/08/19 0900  ???  epoetin alfa-epbx (RETACRIT) injection 10,000 Units, 10,000 Units, SubCUTAneous, Q7D, Gerhard Perches, MD, 10,000 Units at 07/08/19 1213  ???  ergocalciferol capsule 50,000 Units, 50,000 Units, Oral, Q7D, Gerhard Perches, MD, 50,000 Units at 07/08/19 0940  ???  0.45% sodium chloride infusion, 100 mL/hr, IntraVENous, CONTINUOUS, Gerhard Perches, MD, Last Rate: 100 mL/hr at 07/08/19 0800, 100 mL/hr at 07/08/19 0800  ???  tamsulosin (FLOMAX) capsule 0.4 mg, 0.4 mg, Oral, QHS, Gerhard Perches, MD, 0.4 mg at 07/07/19 2105  ???  sodium bicarbonate tablet 650 mg, 650 mg, Oral, BID, Gerhard Perches, MD, 650 mg at 07/08/19 0940  ???  NOREPINephrine (LEVOPHED) 8 mg in 5% dextrose 213m (32 mcg/mL) infusion, 0.5-30 mcg/min, IntraVENous, TITRATE, Sahithi Ordoyne, AMarcy Siren MD, Stopped at 07/05/19 1642  ???  meropenem (MERREM) 500 mg in sterile water (preservative free) 10 mL IV syringe, 0.5 g, IntraVENous, Q12H, HVallarie Mare MD, 500 mg at 07/08/19 01610 ???  insulin lispro (HUMALOG) injection, , SubCUTAneous, ACB&D, RDemetrios Loll NP, Stopped at 07/04/19 0730  ???  glucose chewable tablet  16 g, 4 Tab, Oral, PRN, RDemetrios Loll NP  ???  dextrose (D50W) injection syrg 12.5-25 g, 25-50 mL, IntraVENous, PRN, RDemetrios Loll NP  ???  glucagon (GLUCAGEN) injection 1 mg, 1 mg, IntraMUSCular, PRN, RDemetrios Loll NP  ???  sodium chloride (NS) flush 5-40 mL, 5-40 mL, IntraVENous, Q8H, RDemetrios Loll NP, 10 mL at 07/08/19 0947  ???  sodium chloride (NS) flush 5-40 mL, 5-40 mL, IntraVENous, PRN, RDemetrios Loll NP  ???  acetaminophen (TYLENOL) tablet 650 mg, 650 mg, Oral, Q6H PRN **OR** acetaminophen (TYLENOL) suppository 650 mg, 650 mg, Rectal, Q6H PRN, RDemetrios Loll NP  ???  polyethylene glycol (MIRALAX) packet 17 g, 17 g, Oral, DAILY PRN, RDemetrios Loll NP  ???  promethazine (PHENERGAN) tablet 12.5 mg, 12.5 mg, Oral, Q6H PRN **OR** ondansetron (ZOFRAN) injection 4 mg, 4 mg, IntraVENous, Q6H PRN, RDemetrios Loll NP  ???  heparin (porcine) injection 5,000 Units, 5,000 Units, SubCUTAneous, Q8H, RDemetrios Loll NP, 5,000 Units at 07/08/19 [phone]

## 2019-07-08 NOTE — Progress Notes (Signed)
[facility]     Renal Daily Progress Note:     Admission Date: 07/03/2019     Subjective: feels better, BP remains low but he is asymptomatic, eating and drinking without problems. No N/V or abd pain, foley in. Creatinine has dropped nicely after bilateral ureteral stenting.    Review of Systems  Pertinent items are noted in HPI.    Objective:     Visit Vitals  BP 91/61 (BP 1 Location: Right upper arm, BP Patient Position: At rest)   Pulse 71   Temp 97.3 ??F (36.3 ??C)   Resp 18   Ht 5\' 8"  (1.727 m)   Wt 47.9 kg (105 lb 9.6 oz)   SpO2 99%   BMI 16.06 kg/m??     Temp (24hrs), Avg:97.6 ??F (36.4 ??C), Min:97.3 ??F (36.3 ??C), Max:97.9 ??F (36.6 ??C)        Intake/Output Summary (Last 24 hours) at 07/08/2019 2324  Last data filed at 07/08/2019 1824  Gross per 24 hour   Intake ???   Output 1100 ml   Net -1100 ml     Current Facility-Administered Medications   Medication Dose Route Frequency   ??? dapsone tablet 100 mg  100 mg Oral DAILY   ??? dolutegravir (TIVICAY) tablet 50 mg  50 mg Oral DAILY   ??? emtricitabine-tenofovir alafen (DESCOVY) 200-25 mg tablet 1 Tab  1 Tab Oral DAILY   ??? epoetin alfa-epbx (RETACRIT) injection 10,000 Units  10,000 Units SubCUTAneous Q7D   ??? ergocalciferol capsule 50,000 Units  50,000 Units Oral Q7D   ??? 0.45% sodium chloride infusion  100 mL/hr IntraVENous CONTINUOUS   ??? tamsulosin (FLOMAX) capsule 0.4 mg  0.4 mg Oral QHS   ??? sodium bicarbonate tablet 650 mg  650 mg Oral BID   ??? meropenem (MERREM) 500 mg in sterile water (preservative free) 10 mL IV syringe  0.5 g IntraVENous Q12H   ??? insulin lispro (HUMALOG) injection   SubCUTAneous ACB&D   ??? glucose chewable tablet 16 g  4 Tab Oral PRN   ??? dextrose (D50W) injection syrg 12.5-25 g  25-50 mL IntraVENous PRN   ??? glucagon (GLUCAGEN) injection 1 mg  1 mg IntraMUSCular PRN   ??? sodium chloride (NS) flush 5-40 mL  5-40 mL IntraVENous Q8H   ??? sodium chloride (NS) flush 5-40 mL  5-40 mL IntraVENous PRN   ??? acetaminophen (TYLENOL) tablet 650 mg  650 mg Oral Q6H  PRN    Or   ??? acetaminophen (TYLENOL) suppository 650 mg  650 mg Rectal Q6H PRN   ??? polyethylene glycol (MIRALAX) packet 17 g  17 g Oral DAILY PRN   ??? promethazine (PHENERGAN) tablet 12.5 mg  12.5 mg Oral Q6H PRN    Or   ??? ondansetron (ZOFRAN) injection 4 mg  4 mg IntraVENous Q6H PRN   ??? heparin (porcine) injection 5,000 Units  5,000 Units SubCUTAneous Q8H       Physical Exam:General appearance: alert, cooperative, no distress, appears older than stated age  Lungs: clear to auscultation bilaterally  Heart: regular rate and rhythm, no S3 or S4  Abdomen: soft, non-tender. Bowel sounds normal. No masses,  no organomegaly  Extremities: no edema  Neurologic: Grossly normal    Data Review:     LABS:  Recent Labs     07/08/19  1200 07/07/19  0550 07/06/19  0830   NA 141 138 143   K 4.0   3.9 4.8 5.7*   CL 112* 111* 115*  CO2 22 23 25    BUN 50* 59* 59*   CREA 1.80* 1.97* 2.39*   CA 7.6* 7.5* 7.6*   ALB 2.0* 2.0* 2.4*   MG 1.4*  --   --    Fe sat 62%  PTH 243,  Vit D2 10.7  PSA 2.2  RPR titer 1:64       Recent Labs     07/08/19  1200 07/07/19  0550 07/06/19  0830   WBC 8.7 7.9 5.9   HGB 9.1* 8.0* 9.7*   HCT 28.7* 24.8* 30.5*   PLT 227 206 224     No results for input(s): NAU, KU, CLU, CREAU in the last 72 hours.    No lab exists for component: PROU  Retroperitoneal ultrasound Jul 03, 2019:  History: Renal failure  ??  Retroperitoneal ultrasound was performed. Aorta and IVC appear within normal  limits. Right kidney measures 9.4 x 4.8 x 5.5 cm. There is hydronephrosis. Left  kidney measures 9.3 x 5.1 x 5.0 cm. There is hydronephrosis. The bladder is  distended with a sludge fluid level within it. It measures 8.7 x 5.2 x 5.3 cm.  Foley catheter was placed within the bladder during the examination. There is  some decompression of the bladder. Reevaluation of the kidneys demonstrated  hydronephrosis. It is not indicated how long before reevaluation post Foley.  ??  IMPRESSION  Bilateral hydronephrosis likely due to reflux from  distended  bladder.   ??  CXR 07-03-19:  Chest single view.  ??  Emphysema/bullous cysts. Small volume linear reticular markings left lung base.  No gross interstitial or alveolar pulmonary edema. Left side cardiac device with  leads overlying the heart. No pneumothorax or sizable pleural effusion.  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

## 2019-07-08 NOTE — Progress Notes (Signed)
Physician Progress Note      PATIENTCHAZE, Wesley Clark  CSN #:                  [id number]  DOB:                       1955-10-09  ADMIT DATE:       07/03/2019 3:49 PM  DISCH DATE:  RESPONDING  PROVIDER #:        Sharlette Jansma MD Gabriel Earing MD          QUERY TEXT:    Pt admitted with renal failure.  Noted documentation of sepsis on 5/5 by ordered infectious disease consultant.  If possible, please document in progress notes and discharge summary:    The medical record reflects the following:  Risk Factors: 64 yo male with renal failure, UTI, HIV  Clinical Indicators: BP 88/60, 95/65,  HR 93, CRP 2.0, procalcitonin 0.3, creatinine 6.8  Treatment: IV meropenem, ceftriaxone, Levophed    Thank you,  Amy Gabbert, RN, CCDS, CPC  Options provided:  -- Sepsis confirmed present on admission  -- Sepsis confirmed not present on admission  -- Sepsis ruled out  -- Other - I will add my own diagnosis  -- Disagree - Not applicable / Not valid  -- Disagree - Clinically unable to determine / Unknown  -- Refer to Clinical Documentation Reviewer    PROVIDER RESPONSE TEXT:    The diagnosis of Sepsis was confirmed as present on admission.    Query created by: Lezlie Lye on 07/06/2019 11:10 AM      Electronically signed by:  Glade Lloyd MD Sadao Weyer MD 07/08/2019 9:17 AM

## 2019-07-08 NOTE — Progress Notes (Signed)
Lm for Dr Mohiuddin in ref to 10 beats of wide complex, also LM that I was dc'ing the order for Levophed that's still active on profile.

## 2019-07-08 NOTE — Progress Notes (Signed)
Problem: Falls - Risk of  Goal: *Absence of Falls  Description: Document Wesley Clark Fall Risk and appropriate interventions in the flowsheet.  Outcome: Progressing Towards Goal  Note: Fall Risk Interventions:  Mobility Interventions: Bed/chair exit alarm         Medication Interventions: Evaluate medications/consider consulting pharmacy, Bed/chair exit alarm    Elimination Interventions: Bed/chair exit alarm, Call light in reach, Toileting schedule/hourly rounds    History of Falls Interventions: Bed/chair exit alarm, Evaluate medications/consider consulting pharmacy

## 2019-07-08 NOTE — Progress Notes (Signed)
Progress Note    Patient: Wesley Clark MRN: [id number]  SSN: [id number]    Date of Birth: May 05, 1955  Age: 64 y.o.  Sex: male      Admit Date: 07/03/2019    LOS: 5 days     Subjective:   Patient followed for complicated UTI and HIV-1 infection but urine culture was negative.  He has been transferred out of the ICU.  He remains afebrile with normal WBC but elevated procal and CRP, which are decreasing. He is currently on IV Meropenem along with HIV medications.  Patient awake and responsive, again sitting up eating lunch.  No complaints.  Objective:     Vitals:    07/07/19 1500 07/07/19 1901 07/08/19 0501 07/08/19 0935   BP: (!) 80/46 103/67 114/74 (!) 92/53   Pulse: 71 77 73 82   Resp: 13 13 18 18    Temp: 97.2 ??F (36.2 ??C) 97 ??F (36.1 ??C) 97.5 ??F (36.4 ??C) 97.9 ??F (36.6 ??C)   SpO2: 99% 100% 99% 99%   Weight:       Height:            Intake and Output:  Current Shift: No intake/output data recorded.  Last three shifts: 05/06 1901 - 05/08 0700  In: 1359 [P.O.:1359]  Out: 750 [Urine:750]    Physical Exam:   Vitals signs  and nursing note reviewed.   Constitutional:       General: He is not in acute distress.     Appearance: Normal appearance. He is ill-appearing.   HENT: unremarkable   Neck:      Musculoskeletal: Neck supple.   Cardiovascular:      Rate and Rhythm: Regular rhythm.       Heart sounds: Normal heart sounds. No murmur.   Pulmonary:      Breath sounds: No wheezing, rhonchi or rales.   Abdominal:      General: Abdomen is flat. Bowel sounds are normal.      Palpations: Abdomen is soft.      Tenderness: There is no abdominal tenderness.   Genitourinary:     Comments: Foley catheter  Musculoskeletal:      Right lower leg: No edema.      Left lower leg: No edema.   Skin:     Findings: No lesion or rash.   Neurological:      General: No focal deficit present.      Mental Status: He is alert. He is disoriented.   Psychiatric:         Mood and Affect: Mood normal.         Behavior: Behavior normal.          Thought Content: Thought content normal.      Lab/Data Review:     WBC 7,900    Procal 0.30 <0.34  CRP 1.57 <2.01    HIV-1 antibody Positive  CD4 count 151 (16.8%)  HIV-1 RNA viral load 90,000 copies/ml    Urine culture (5/3) No growth    Assessment:     Active Problems:    Renal failure (07/03/2019)      Hyperkalemia (07/03/2019)      Electrolyte imbalance (07/03/2019)      AKI (acute kidney injury) (HCC) (07/03/2019)      1. Complicated UTI (bilateral hydronephrosis/distended bladder), with marked pyuria, urine culture negative, Day #5 IV Meropenem.  2. HIV-1 infection, CDC Class A2 with CD4 <200 and viral load of 90,000 copies/ml.  3. Hypotension, etiology unclear, ?sepsis  4. Renal failure  5. Penicillin allergy  ??  Comment:  Patient has high viral load and CD4 <200 which indicates need for anti-retroviral therapy and pneumocystis prophylaxis.    Plan:   1. Continue IV Meropenem for complicated UTI; may be reasonable to transition to oral Levaquin though no organism identified.  2. In am, repeat procal and CRP  3. Repeat urinalysis with reflex culture  4. Continue Tivicay/Truvada and Dapsone; check HIV Genosure given high viral load, ?virologic failure  5. Will contact Dr. Ricarda Frame, [facility]      Signed By: Lorina Rabon, MD     Jul 08, 2019

## 2019-07-09 LAB — GLUCOSE, POC
Glucose (POC): 77 mg/dL (ref 65–100)
Glucose (POC): 217 mg/dL — ABNORMAL HIGH (ref 65–100)

## 2019-07-09 LAB — C REACTIVE PROTEIN, QT: C-Reactive protein: 1.22 mg/dL — ABNORMAL HIGH (ref 0.00–0.60)

## 2019-07-09 LAB — POTASSIUM: Potassium: 4 mmol/L (ref 3.5–5.1)

## 2019-07-09 LAB — MAGNESIUM: Magnesium: 1.4 mg/dL — ABNORMAL LOW (ref 1.6–2.4)

## 2019-07-09 LAB — PROCALCITONIN: Procalcitonin: 0.15 ng/mL — ABNORMAL HIGH

## 2019-07-09 LAB — C. DIFFICILE (DNA): C. difficile (DNA): NEGATIVE

## 2019-07-09 MED FILL — MEROPENEM 500 MG IV SOLR: 500 mg | INTRAVENOUS | Qty: 500

## 2019-07-09 MED FILL — DAPSONE 25 MG TAB: 25 mg | ORAL | Qty: 4

## 2019-07-09 MED FILL — SODIUM BICARBONATE 650 MG TAB: 650 mg | ORAL | Qty: 1

## 2019-07-09 MED FILL — HEPARIN (PORCINE) 5,000 UNIT/ML IJ SOLN: 5000 unit/mL | INTRAMUSCULAR | Qty: 1

## 2019-07-09 MED FILL — TIVICAY 50 MG TABLET: 50 mg | ORAL | Qty: 1

## 2019-07-09 MED FILL — TAMSULOSIN SR 0.4 MG 24 HR CAP: 0.4 mg | ORAL | Qty: 1

## 2019-07-09 MED FILL — DESCOVY 200 MG-25 MG TABLET: 200-25 mg | ORAL | Qty: 1

## 2019-07-09 MED FILL — HUMALOG U-100 INSULIN 100 UNIT/ML SUBCUTANEOUS SOLUTION: 100 unit/mL | SUBCUTANEOUS | Qty: 2

## 2019-07-09 NOTE — Progress Notes (Signed)
General Daily Progress Note          Patient Name:   Wesley Clark       Date of Birth:   06-23-1955       Age:  64 y.o.      Admit Date: 07/03/2019      Subjective:       Patient alert awake denies any chest pain or shortness of breath     Patient have cystoscopy done and bilateral ureteral stent placed    Hypotension off  Levophed      Objective:     Visit Vitals  BP 122/68   Pulse 74   Temp 98.5 ??F (36.9 ??C)   Resp 18   Ht 5\' 8"  (1.727 m)   Wt 47.9 kg (105 lb 9.6 oz)   SpO2 98%   BMI 16.06 kg/m??        Recent Results (from the past 24 hour(s))   URINALYSIS W/ REFLEX CULTURE    Collection Time: 07/08/19  6:20 PM    Specimen: Urine   Result Value Ref Range    Color Yellow/Straw      Appearance Turbid (A) Clear      Specific gravity 1.011 1.003 - 1.030      pH (UA) 6.0 5.0 - 8.0      Protein 30 (A) Negative mg/dL    Glucose Negative Negative mg/dL    Ketone Negative Negative mg/dL    Bilirubin Negative Negative      Blood Large (A) Negative      Urobilinogen 0.1 0.1 - 1.0 EU/dL    Nitrites Negative Negative      Leukocyte Esterase Large (A) Negative      UA:UC IF INDICATED PENDING     WBC >100 (H) 0 - 4 /hpf    RBC >100 (H) 0 - 5 /hpf    Bacteria Negative Negative /hpf    Other Urine Micro Present     C. DIFFICILE (DNA)    Collection Time: 07/09/19  3:45 AM   Result Value Ref Range    C. difficile (DNA) Negative Negative     C REACTIVE PROTEIN, QT    Collection Time: 07/09/19  7:20 AM   Result Value Ref Range    C-Reactive protein 1.22 (H) 0.00 - 0.60 mg/dL   PROCALCITONIN    Collection Time: 07/09/19  7:20 AM   Result Value Ref Range    Procalcitonin 0.15 (H) 0 ng/mL   GLUCOSE, POC    Collection Time: 07/09/19  8:25 AM   Result Value Ref Range    Glucose (POC) 77 65 - 100 mg/dL    Performed by Isidore Moos      @LABCOMPINFO @      Review of Systems    Constitutional: Negative for chills and fever.   HENT: Negative.    Eyes: Negative.    Respiratory: Negative.    Cardiovascular: Negative.    Gastrointestinal: Negative for  abdominal pain and nausea.   Skin: Negative.    Neurological: Negative.        Physical Exam:      Constitutional: pt is oriented to person, place, and time.   HENT:   Head: Normocephalic and atraumatic.   Eyes: Pupils are equal, round, and reactive to light. EOM are normal.   Cardiovascular: Normal rate, regular rhythm and normal heart sounds.   Pulmonary/Chest: Breath sounds normal. No wheezes. No rales.   Exhibits no tenderness.   Abdominal: Soft. Bowel sounds are  normal. There is no abdominal tenderness. There is no rebound and no guarding.   Musculoskeletal: Normal range of motion.   Neurological: pt is alert and oriented to person, place, and time.     CT ABD PELV WO CONT   Final Result      Bilateral ureteric stents. Bilateral hydronephrosis.      XR FLUOROSCOPY UNDER 60 MINUTES   Final Result      [place] RETROPERITONEUM COMP   Final Result   1. Bilateral moderate hydronephrosis, similar to previous.   2. Increased left renal echotexture from medical renal disease.      [place] RETROPERITONEUM COMP   Final Result   Bilateral hydronephrosis likely due to reflux from distended   bladder.       XR CHEST SNGL V   Final Result           Recent Results (from the past 24 hour(s))   URINALYSIS W/ REFLEX CULTURE    Collection Time: 07/08/19  6:20 PM    Specimen: Urine   Result Value Ref Range    Color Yellow/Straw      Appearance Turbid (A) Clear      Specific gravity 1.011 1.003 - 1.030      pH (UA) 6.0 5.0 - 8.0      Protein 30 (A) Negative mg/dL    Glucose Negative Negative mg/dL    Ketone Negative Negative mg/dL    Bilirubin Negative Negative      Blood Large (A) Negative      Urobilinogen 0.1 0.1 - 1.0 EU/dL    Nitrites Negative Negative      Leukocyte Esterase Large (A) Negative      UA:UC IF INDICATED PENDING     WBC >100 (H) 0 - 4 /hpf    RBC >100 (H) 0 - 5 /hpf    Bacteria Negative Negative /hpf    Other Urine Micro Present     C. DIFFICILE (DNA)    Collection Time: 07/09/19  3:45 AM   Result Value Ref Range    C.  difficile (DNA) Negative Negative     C REACTIVE PROTEIN, QT    Collection Time: 07/09/19  7:20 AM   Result Value Ref Range    C-Reactive protein 1.22 (H) 0.00 - 0.60 mg/dL   PROCALCITONIN    Collection Time: 07/09/19  7:20 AM   Result Value Ref Range    Procalcitonin 0.15 (H) 0 ng/mL   GLUCOSE, POC    Collection Time: 07/09/19  8:25 AM   Result Value Ref Range    Glucose (POC) 77 65 - 100 mg/dL    Performed by Lenise Herald        Results     Procedure Component Value Units Date/Time    C. DIFFICILE (DNA) [[id number]] Collected: 07/09/19 0345    Order Status: Completed Specimen: Stool Updated: 07/09/19 0839     C. difficile (DNA) Negative       MRSA SCREEN - PCR (NASAL) [[id number]] Collected: 07/04/19 1543    Order Status: Completed Specimen: Swab Updated: 07/04/19 2203     MRSA by PCR, Nasal Not Detected       COVID-19 RAPID TEST [[id number]] Collected: 07/04/19 0630    Order Status: Completed Specimen: Nasopharyngeal Updated: 07/04/19 0738     Specimen source Nasopharyngeal        COVID-19 rapid test Not Detected        Comment: Rapid Abbott ID Now   Rapid NAAT:  The  specimen is NEGATIVE for SARS-CoV-2, the novel coronavirus associated with COVID-19.   Negative results should be treated as presumptive and, if inconsistent with clinical signs and symptoms or necessary for patient management, should be tested with an alternative molecular assay. Negative results do not preclude SARS-CoV-2 infection and should not be used as the sole basis for patient management decisions.   This test has been authorized by the FDA under   an Emergency Use Authorization (EUA) for use by authorized laboratories. Fact sheet for Healthcare Providers: Flick[employer].gl Fact sheet for Patients: [place].at   Methodology: Isothermal Nucleic Acid Amplification         CULTURE, URINE [[id number]] Collected: 07/03/19 1615    Order Status: Completed Specimen: Urine Updated: 07/05/19 0838      Special Requests: --        No Special Requests  Reflexed from [id number]       Culture result: No Growth (<1000 cfu/mL)              Labs:     Recent Labs     07/08/19  1200 07/07/19  0550   WBC 8.7 7.9   HGB 9.1* 8.0*   HCT 28.7* 24.8*   PLT 227 206     Recent Labs     07/08/19  1200 07/07/19  0550   NA 141 138   K 4.0   3.9 4.8   CL 112* 111*   CO2 22 23   BUN 50* 59*   CREA 1.80* 1.97*   GLU 129* 117*   CA 7.6* 7.5*   MG 1.4*  --      Recent Labs     07/08/19  1200 07/07/19  0550   ALT 17 16   AP 55 58   TBILI 0.2 0.2   TP 6.0* 6.0*   ALB 2.0* 2.0*   GLOB 4.0 4.0     No results for input(s): INR, PTP, APTT, INREXT, INREXT in the last 72 hours.   Recent Labs     07/07/19  0530   TIBC 174*   PSAT 47      No results found for: FOL, RBCF   No results for input(s): PH, PCO2, PO2 in the last 72 hours.  No results for input(s): CPK, CKNDX, TROIQ in the last 72 hours.    No lab exists for component: CPKMB  No results found for: CHOL, CHOLX, CHLST, CHOLV, HDL, HDLP, LDL, LDLC, DLDLP, TGLX, TRIGL, TRIGP, CHHD, CHHDX  Lab Results   Component Value Date/Time    Glucose (POC) 77 07/09/2019 08:25 AM    Glucose (POC) 105 (H) 07/07/2019 04:18 PM    Glucose (POC) 123 (H) 07/07/2019 12:01 PM    Glucose (POC) 167 (H) 07/06/2019 03:54 PM    Glucose (POC) 118 (H) 07/05/2019 03:18 PM     Lab Results   Component Value Date/Time    Color Yellow/Straw 07/08/2019 06:20 PM    Appearance Turbid (A) 07/08/2019 06:20 PM    Specific gravity 1.011 07/08/2019 06:20 PM    pH (UA) 6.0 07/08/2019 06:20 PM    Protein 30 (A) 07/08/2019 06:20 PM    Glucose Negative 07/08/2019 06:20 PM    Ketone Negative 07/08/2019 06:20 PM    Bilirubin Negative 07/08/2019 06:20 PM    Urobilinogen 0.1 07/08/2019 06:20 PM    Nitrites Negative 07/08/2019 06:20 PM    Leukocyte Esterase Large (A) 07/08/2019 06:20 PM    Bacteria Negative 07/08/2019 06:20 PM    WBC >  100 (H) 07/08/2019 06:20 PM    RBC >100 (H) 07/08/2019 06:20 PM         Assessment:     Acute kidney injury   Bilateral hydronephrosis s/p cystoscopy bilateral ureteral stent placement  Hypotension  Urinary retention  Protein calorie malnutrition moderate  Electrolyte disturbances??-hyponatremia, hyperkalemia, hyperphosphatemia, hypermagnesemia  Decreased mobility  History of cardiac disease and pacemaker  History of high blood pressure  History of prostate cancer  Abnormal chest x-ray suggestive of emphysema??-patient does not currently smoke  Cachexia  Hyperkalemia  Anemia hemoglobin 9.1  HIV disease  Reactive RPR/follow with infectious disease    Plan:         On IV meropenem  Subcu heparin 5000 every 8 hours  Flomax 0.4 daily  Lokelma 10 g 1 dose    Repeat H&H      ID and renal consult    Repeat the labs in the morning   PT OT consult discharge planning    Current Facility-Administered Medications:   ???  dapsone tablet 100 mg, 100 mg, Oral, DAILY, Lorina Rabon, MD, 100 mg at 07/09/19 0946  ???  dolutegravir (TIVICAY) tablet 50 mg, 50 mg, Oral, DAILY, Lorina Rabon, MD, 50 mg at 07/09/19 0947  ???  emtricitabine-tenofovir alafen (DESCOVY) 200-25 mg tablet 1 Tab, 1 Tab, Oral, DAILY, Lorina Rabon, MD, 1 Tab at 07/09/19 0900  ???  epoetin alfa-epbx (RETACRIT) injection 10,000 Units, 10,000 Units, SubCUTAneous, Q7D, Rudene Re, MD, 10,000 Units at 07/08/19 1213  ???  ergocalciferol capsule 50,000 Units, 50,000 Units, Oral, Q7D, Rudene Re, MD, 50,000 Units at 07/08/19 0940  ???  0.45% sodium chloride infusion, 50 mL/hr, IntraVENous, CONTINUOUS, Rudene Re, MD, Last Rate: 50 mL/hr at 07/09/19 0445, 50 mL/hr at 07/09/19 0445  ???  tamsulosin (FLOMAX) capsule 0.4 mg, 0.4 mg, Oral, QHS, Rudene Re, MD, 0.4 mg at 07/08/19 2207  ???  sodium bicarbonate tablet 650 mg, 650 mg, Oral, BID, Rudene Re, MD, 650 mg at 07/09/19 0946  ???  meropenem (MERREM) 500 mg in sterile water (preservative free) 10 mL IV syringe, 0.5 g, IntraVENous, Q12H, Lorina Rabon, MD, 500 mg at 07/09/19 0947  ???  insulin lispro (HUMALOG)  injection, , SubCUTAneous, ACB&D, Willeen Cass, NP, Stopped at 07/04/19 0730  ???  glucose chewable tablet 16 g, 4 Tab, Oral, PRN, Willeen Cass, NP  ???  dextrose (D50W) injection syrg 12.5-25 g, 25-50 mL, IntraVENous, PRN, Willeen Cass, NP  ???  glucagon (GLUCAGEN) injection 1 mg, 1 mg, IntraMUSCular, PRN, Willeen Cass, NP  ???  sodium chloride (NS) flush 5-40 mL, 5-40 mL, IntraVENous, Q8H, Willeen Cass, NP, 10 mL at 07/09/19 0700  ???  sodium chloride (NS) flush 5-40 mL, 5-40 mL, IntraVENous, PRN, Willeen Cass, NP  ???  acetaminophen (TYLENOL) tablet 650 mg, 650 mg, Oral, Q6H PRN **OR** acetaminophen (TYLENOL) suppository 650 mg, 650 mg, Rectal, Q6H PRN, Willeen Cass, NP  ???  polyethylene glycol (MIRALAX) packet 17 g, 17 g, Oral, DAILY PRN, Willeen Cass, NP  ???  promethazine (PHENERGAN) tablet 12.5 mg, 12.5 mg, Oral, Q6H PRN **OR** ondansetron (ZOFRAN) injection 4 mg, 4 mg, IntraVENous, Q6H PRN, Willeen Cass, NP  ???  heparin (porcine) injection 5,000 Units, 5,000 Units, SubCUTAneous, Q8H, Willeen Cass, NP, 5,000 Units at 07/09/19 0700

## 2019-07-09 NOTE — Progress Notes (Signed)
Progress Note    Patient: Wesley Clark MRN: [id number]  SSN: [id number]    Date of Birth: 11-05-1955  Age: 64 y.o.  Sex: male      Admit Date: 07/03/2019    LOS: 6 days     Subjective:   Patient followed for complicated UTI and HIV-1 infection but urine culture was negative. Repeat urinalysis showing persistent pyuria but again no bacteria.  He remains afebrile with normal WBC and decreasing procal and CRP. He is currently on IV Meropenem along with HIV medications.  Patient awake and responsive, again sitting up eating lunch.  No complaints.  Objective:     Vitals:    07/08/19 1607 07/08/19 2204 07/09/19 0445 07/09/19 0755   BP:  91/61 92/61 93/61    Pulse: 76 71 90 87   Resp:  18 18 18    Temp:  97.3 ??F (36.3 ??C) 98 ??F (36.7 ??C) 98.5 ??F (36.9 ??C)   SpO2:  99% 97% 99%   Weight:       Height:            Intake and Output:  Current Shift: No intake/output data recorded.  Last three shifts: 05/07 1901 - 05/09 0700  In: -   Out: 1100 [Urine:1100]    Physical Exam:   Vitals signs  and nursing note reviewed.   Constitutional:       General: He is not in acute distress.     Appearance: Normal appearance. He is ill-appearing.   HENT: unremarkable   Neck:      Musculoskeletal: Neck supple.   Cardiovascular:      Rate and Rhythm: Regular rhythm.       Heart sounds: Normal heart sounds. No murmur.   Pulmonary:      Breath sounds: No wheezing, rhonchi or rales.   Abdominal:      General: Abdomen is flat. Bowel sounds are normal.      Palpations: Abdomen is soft.      Tenderness: There is no abdominal tenderness.   Genitourinary:     Comments: Foley catheter  Musculoskeletal:      Right lower leg: No edema.      Left lower leg: No edema.   Skin:     Findings: No lesion or rash.   Neurological:      General: No focal deficit present.      Mental Status: He is alert. He is disoriented.   Psychiatric:         Mood and Affect: Mood normal.         Behavior: Behavior normal.         Thought Content: Thought content normal.       Lab/Data Review:     WBC 8,700    Procal 0.15 < 0.30 <0.34  CRP 1.22 <1.57 <2.01    Stool C. Diff toxin Negative    HIV-1 antibody Positive  CD4 count 151 (16.8%)  HIV-1 RNA viral load 90,000 copies/ml    Urine culture (5/3) No growth  Urine culture (5/9) ?Pending    Assessment:     Active Problems:    Renal failure (07/03/2019)      Hyperkalemia (07/03/2019)      Electrolyte imbalance (07/03/2019)      AKI (acute kidney injury) (HCC) (07/03/2019)      1. Complicated UTI (bilateral hydronephrosis/distended bladder), with PERSISTENT pyuria, repeat urine culture pending, Day #6 IV Meropenem.  2. 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 Hypotension, etiology unclear, ?sepsis  4. Renal failure  5. Penicillin allergy  ??  Comment:  Patient has persistent pyuria but again no bacteria. Waiting for repeat urine culture. Procal and CRP decreasing.    Plan:   1. Continue IV Meropenem for complicated UTI  2. In am, repeat procal and CRP  3. Follow-up repeat urine culture  4. Continue Tivicay/Descovy and Dapsone  5. Follow-up HIV Genosure resistance profile for possible virologic failure  6. Update Dr. Ricarda Frame, [facility]      Signed By: Lorina Rabon, MD     Jul 09, 2019

## 2019-07-10 LAB — CBC WITH AUTOMATED DIFF
ABS. BASOPHILS: 0 10*3/uL (ref 0.0–0.1)
ABS. EOSINOPHILS: 0.8 10*3/uL — ABNORMAL HIGH (ref 0.0–0.4)
ABS. IMM. GRANS.: 0.1 10*3/uL — ABNORMAL HIGH (ref 0.00–0.04)
ABS. LYMPHOCYTES: 1.3 10*3/uL (ref 0.8–3.5)
ABS. MONOCYTES: 0.4 10*3/uL (ref 0.0–1.0)
ABS. NEUTROPHILS: 3.6 10*3/uL (ref 1.8–8.0)
ABSOLUTE NRBC: 0 10*3/uL (ref 0.00–0.01)
BASOPHILS: 1 % (ref 0–1)
EOSINOPHILS: 13 % — ABNORMAL HIGH (ref 0–7)
HCT: 28.4 % — ABNORMAL LOW (ref 36.6–50.3)
HGB: 8.8 g/dL — ABNORMAL LOW (ref 12.1–17.0)
IMMATURE GRANULOCYTES: 1 % — ABNORMAL HIGH (ref 0–0.5)
LYMPHOCYTES: 21 % (ref 12–49)
MCH: 29.5 PG (ref 26.0–34.0)
MCHC: 31 g/dL (ref 30.0–36.5)
MCV: 95.3 FL (ref 80.0–99.0)
MONOCYTES: 7 % (ref 5–13)
MPV: 10.1 FL (ref 8.9–12.9)
NEUTROPHILS: 57 % (ref 32–75)
NRBC: 0 PER 100 WBC
PLATELET: 285 10*3/uL (ref 150–400)
RBC: 2.98 M/uL — ABNORMAL LOW (ref 4.10–5.70)
RDW: 13.8 % (ref 11.5–14.5)
WBC: 6.3 10*3/uL (ref 4.1–11.1)

## 2019-07-10 LAB — GLUCOSE, POC
Glucose (POC): 88 mg/dL (ref 65–100)
Glucose (POC): 114 mg/dL — ABNORMAL HIGH (ref 65–100)
Glucose (POC): 131 mg/dL — ABNORMAL HIGH (ref 65–100)

## 2019-07-10 LAB — METABOLIC PANEL, COMPREHENSIVE
A-G Ratio: 0.5 — ABNORMAL LOW (ref 1.1–2.2)
ALT (SGPT): 20 U/L (ref 12–78)
AST (SGOT): 12 U/L — ABNORMAL LOW (ref 15–37)
Albumin: 2.2 g/dL — ABNORMAL LOW (ref 3.5–5.0)
Alk. phosphatase: 60 U/L (ref 45–117)
Anion gap: 6 mmol/L (ref 5–15)
BUN/Creatinine ratio: 28 — ABNORMAL HIGH (ref 12–20)
BUN: 55 mg/dL — ABNORMAL HIGH (ref 6–20)
Bilirubin, total: 0.3 mg/dL (ref 0.2–1.0)
CO2: 23 mmol/L (ref 21–32)
Calcium: 8 mg/dL — ABNORMAL LOW (ref 8.5–10.1)
Chloride: 109 mmol/L — ABNORMAL HIGH (ref 97–108)
Creatinine: 1.97 mg/dL — ABNORMAL HIGH (ref 0.70–1.30)
GFR est AA: 42 mL/min/{1.73_m2} — ABNORMAL LOW (ref >60)
GFR est non-AA: 34 mL/min/{1.73_m2} — ABNORMAL LOW (ref >60)
Globulin: 4.2 g/dL — ABNORMAL HIGH (ref 2.0–4.0)
Glucose: 78 mg/dL (ref 65–100)
Potassium: 4.9 mmol/L (ref 3.5–5.1)
Protein, total: 6.4 g/dL (ref 6.4–8.2)
Sodium: 138 mmol/L (ref 136–145)

## 2019-07-10 LAB — PROCALCITONIN: Procalcitonin: 0.29 ng/mL — ABNORMAL HIGH

## 2019-07-10 LAB — C REACTIVE PROTEIN, QT: C-Reactive protein: 1.62 mg/dL — ABNORMAL HIGH (ref 0.00–0.60)

## 2019-07-10 MED ORDER — MIDODRINE 10 MG TAB
10 mg | ORAL_TABLET | Freq: Three times a day (TID) | ORAL | 0 refills | Status: AC
Start: 2019-07-10 — End: 2019-08-09

## 2019-07-10 MED ORDER — DOLUTEGRAVIR 50 MG TABLET
50 mg | ORAL_TABLET | Freq: Every day | ORAL | 0 refills | Status: DC
Start: 2019-07-10 — End: 2019-09-21

## 2019-07-10 MED ORDER — ERGOCALCIFEROL (VITAMIN D2) 50,000 UNIT CAP
1250 mcg (50,000 unit) | ORAL_CAPSULE | ORAL | 0 refills | Status: DC
Start: 2019-07-10 — End: 2020-01-02

## 2019-07-10 MED ORDER — MIDODRINE 5 MG TAB
5 mg | Freq: Three times a day (TID) | ORAL | Status: DC
Start: 2019-07-10 — End: 2019-07-12
  Administered 2019-07-10 – 2019-07-12 (×8): via ORAL

## 2019-07-10 MED ORDER — EMTRICITABINE 200 MG-TENOFOVIR ALAFENAMIDE FUMARATE 25 MG TABLET
200-25 mg | ORAL_TABLET | Freq: Every day | ORAL | 0 refills | Status: DC
Start: 2019-07-10 — End: 2019-09-21

## 2019-07-10 MED ORDER — LOPERAMIDE 2 MG CAP
2 mg | Freq: Four times a day (QID) | ORAL | Status: DC | PRN
Start: 2019-07-10 — End: 2019-07-12

## 2019-07-10 MED ORDER — DAPSONE 100 MG TAB
100 mg | ORAL_TABLET | Freq: Every day | ORAL | 0 refills | Status: DC
Start: 2019-07-10 — End: 2019-09-21

## 2019-07-10 MED ORDER — SODIUM BICARBONATE 650 MG TAB
650 mg | ORAL_TABLET | Freq: Two times a day (BID) | ORAL | 0 refills | Status: DC
Start: 2019-07-10 — End: 2020-01-02

## 2019-07-10 MED ORDER — TAMSULOSIN SR 0.4 MG 24 HR CAP
0.4 mg | ORAL_CAPSULE | Freq: Every evening | ORAL | 0 refills | Status: DC
Start: 2019-07-10 — End: 2020-01-02

## 2019-07-10 MED ORDER — LOPERAMIDE 2 MG CAP
2 mg | Freq: Once | ORAL | Status: AC
Start: 2019-07-10 — End: 2019-07-10
  Administered 2019-07-10: 05:00:00 via ORAL

## 2019-07-10 MED FILL — MIDODRINE 5 MG TAB: 5 mg | ORAL | Qty: 2

## 2019-07-10 MED FILL — SODIUM BICARBONATE 650 MG TAB: 650 mg | ORAL | Qty: 1

## 2019-07-10 MED FILL — MEROPENEM 500 MG IV SOLR: 500 mg | INTRAVENOUS | Qty: 500

## 2019-07-10 MED FILL — HEPARIN (PORCINE) 5,000 UNIT/ML IJ SOLN: 5000 unit/mL | INTRAMUSCULAR | Qty: 1

## 2019-07-10 MED FILL — DESCOVY 200 MG-25 MG TABLET: 200-25 mg | ORAL | Qty: 1

## 2019-07-10 MED FILL — TAMSULOSIN SR 0.4 MG 24 HR CAP: 0.4 mg | ORAL | Qty: 1

## 2019-07-10 MED FILL — DAPSONE 25 MG TAB: 25 mg | ORAL | Qty: 4

## 2019-07-10 MED FILL — LOPERAMIDE 2 MG CAP: 2 mg | ORAL | Qty: 2

## 2019-07-10 MED FILL — TIVICAY 50 MG TABLET: 50 mg | ORAL | Qty: 1

## 2019-07-10 NOTE — Progress Notes (Signed)
OCCUPATIONAL THERAPY TREATMENT  Patient: Wesley Clark (64 y.o. male)  Date: 07/10/2019  Diagnosis: Renal failure [N19]  AKI (acute kidney injury) ([place]) [N17.9]  Hyperkalemia [E87.5]  Electrolyte imbalance [E87.8] <principal problem not specified>  Procedure(s) (LRB):  CYSTOSCOPY BILATERAL RETROGRADE, BILATERAL URETERAL STENT PLACEMENT (Bilateral) 4 Days Post-Op  Precautions:    Chart, occupational therapy assessment, plan of care, and goals were reviewed.    ASSESSMENT  Patient continues with skilled OT services and is progressing towards goals.  Pt semi supine in bed upon arrival and agreeable to session. Performed sup<>sit with Min A for assist with BLE. Completed  sit<> stand with min Ax2 due to pt unsteady upon standing. Pt able to perform few forward step with RW and min A x 2, took seated rest break and then performed functional ambulation around bed. No LOB or knee buckling noted, however patient did become shaky towards end. Pt educated on up-right posterior and performing ROM exercises with neck to reduce flexion on neck area. Performed seated therex during seated rest break, see details below. Left semi supine in bed with call bell in reach and needs met. Recommending d/c to SNF once medically appropriate.      Current Level of Function Impacting Discharge (ADLs): general weakness, increased assistance with transfers and mobility           PLAN :  Patient continues to benefit from skilled intervention to address the above impairments.  Continue treatment per established plan of care.  to address goals.    Recommendation for discharge: (in order for the patient to meet his/her long term goals)  Therapy up to 5 days/week in SNF setting    This discharge recommendation:  Has been made in collaboration with the attending provider and/or case management    IF patient discharges home will need the following DME: TBD       SUBJECTIVE:   Patient stated ???i'm having side pain.???    OBJECTIVE DATA SUMMARY:    Cognitive/Behavioral Status:  Neurologic State: Alert  Orientation Level: Oriented X4  Cognition: Follows commands;Appropriate decision making;Appropriate safety awareness;Appropriate for age attention/concentration      Functional Mobility and Transfers for ADLs:  Bed Mobility:  Rolling: Minimum assistance  Supine to Sit: Minimum assistance  Sit to Supine: Minimum assistance  Scooting: Minimum assistance    Transfers:  Sit to Stand: Minimum assistance;Assist x2          Balance:  Sitting: Impaired  Sitting - Static: Good (unsupported)  Sitting - Dynamic: Fair (occasional)  Standing: Impaired  Standing - Static: Constant support;Fair  Standing - Dynamic : Constant support;Fair      Therapeutic Exercises:   Exercise Sets Reps AROM AAROM PROM Self PROM Comments   Shoulder flex/ext 1 10 _0  _1  _2  _3     Elbow flex/ext 1 10 _4  _5  _6  _7     Wrist flex/ext 1 10 _8  _9  _10  _11        _12  _13  _14  _15           Pain:   Side pain     Activity Tolerance:   Fair and Poor  Please refer to the flowsheet for vital signs taken during this treatment.    After treatment patient left in no apparent distress:   Supine in bed, Call bell within reach, Bed / chair alarm activated, and Side rails x 3    COMMUNICATION/COLLABORATION:   The patient???s plan of care was discussed with: Physical therapy assistant. Co-tx with PTA for increased assistance  with transfers and mobility.      Rolanda Jay  Time Calculation: 29 mins    Problem: Self Care Deficits Care Plan (Adult)  Goal: *Acute Goals and Plan of Care (Insert Text)  Description: Pt will be SBA sup <> sit in prep for EOB ADLs  Pt will be SBA grooming sitting EOB  Pt will be SBA LE dressing sitting EOB/long sit  Pt will be SBA sit <> stand in prep for toileting LRAD  Pt will be SBA toileting/toilet transfer/cloth mgmt LRAD  Pt will be IND following UE HEP in prep for self care tasks  Outcome: Progressing Towards Goal

## 2019-07-10 NOTE — Progress Notes (Signed)
Patient is currently pending insurance auth to d/c to [facility] for SNF, confirmed with Jade in admissions at [facility].  Updates sent via navi to facility.    CM continues to follow.

## 2019-07-10 NOTE — Progress Notes (Signed)
Comprehensive Nutrition Assessment    Type and Reason for Visit: Reassess(interim)    Nutrition Recommendations/Plan:   Continue Cardiac diet        Monitor need for Soft/Chopped restriction  Continue Ensure Enlive TID  Continue Ensure pdg daily   Allow additional snacks as desired  ??  Update measured wts frequently  Document %meal and supplement intakes, BMs in I/Os    Nutrition Assessment:  Admitted for generalized weakness, SOB x 2-3 days pta. COVID 19 negative. CXR finding empysema/bullous cysts. US renal finding bilat hydronephrosis and distended bladder. Admitted to ICU d/t hypotension, requiring x1 pressor now d/c. Ordered Cardiac diet, intakes 50-75% per EMR. RD visualized 50% lunch intake (pork chop, vegetables, pudding). Pt reports not being ???picky??? at all, agreeable to all ONS. Denied specific preferences. RD spoke with CM on unit d/t pt verbalizing concerns for food insecurity- per CM pt finished application for meals on wheels. Today, pt states he ate 100% of B, L, and has a great appetite. Labs: Glu POC 114, H/H 8.8/ 28.4, BUN 55, Creat 1.97, Mg 1.4. Labs: tivicay, Heparin, insulin, merrem, proamatine, sodium bicarbonate, IVF.     Malnutrition Assessment:  Malnutrition Status:  Severe malnutrition    Context:  Chronic illness     Findings of the 6 clinical characteristics of malnutrition:   Energy Intake:  7 - 75% or less est energy requirements for 1 month or longer(suspected d/t pt reporting limited intakes pta d/t difficulty obtaining food)  Weight Loss:  Unable to assess     Body Fat Loss:  1 - Mild body fat loss, Orbital, Fat overlying ribs, Triceps   Muscle Mass Loss:  7 - Severe muscle mass loss, Scapula (trapezius), Thigh (quadraceps), Temples (temporalis)  Fluid Accumulation:  No significant fluid accumulation,      Estimated Daily Nutrient Needs:  Energy (kcal): 1530kcal (32kcal/kg); Weight Used for Energy Requirements: Current  Protein (g): 58g (1.2g/kg); Weight Used for Protein  Requirements: Current  Fluid (ml/day): ; Method Used for Fluid Requirements: 1 ml/kcal    Nutrition Related Findings:  NFPE +severe muscle and fat wasting. ?Vitiligo. Poor dentition observed, however pt denied c/s difficulty. Denied n/v/c, but has had diarrhea since admission. BM 5/9, loose per pt. No edema.      Wounds:    None       Current Nutrition Therapies:  DIET NUTRITIONAL SUPPLEMENTS Lunch; Ensure Pudding (chocolate)  DIET NUTRITIONAL SUPPLEMENTS Breakfast, Lunch, Dinner; [employer] (alternate flavors)  DIET CARDIAC Regular    Anthropometric Measures:  ?? Height:  5\' 8"  (172.7 cm)  ?? Current Body Wt:  47.9 kg (105 lb 9.6 oz)(5/5)       ?? Usual Body Wt:  49.9 kg (110 lb)(per pt - unsure when last weighed)     ?? Ideal Body Wt:  154 lbs:  68.6 %       ?? BMI Category:  Underweight (BMI less than 18.5)       Nutrition Diagnosis:   ?? Underweight related to inadequate protein-energy intake as evidenced by BMI, severe muscle loss    Nutrition Interventions:   Food and/or Nutrient Delivery: Continue current diet, Start oral nutrition supplement, Snacks (specify)  Nutrition Education and Counseling: No recommendations at this time  Coordination of Nutrition Care: Continue to monitor while inpatient, Coordination of community care(CM provided resources for Meals on Wheels)    Goals:  Meet >75% est needs via PO x 5-7 days, Wt gain 0.5kg/week, Lytes WNL, Maintain skin integrity  Nutrition Monitoring and Evaluation:   Behavioral-Environmental Outcomes: None identified  Food/Nutrient Intake Outcomes: Food and nutrient intake, Supplement intake  Physical Signs/Symptoms Outcomes: Chewing or swallowing, Biochemical data, GI status, Weight, Nutrition focused physical findings    Discharge Planning:    Continue oral nutrition supplement, Assist with food insecurity     Electronically signed by Dewayne Hatch, RD on 07/10/2019 at 2:23 PM    Contact: [phone]

## 2019-07-10 NOTE — Progress Notes (Cosign Needed)
General Daily Progress Note          Patient Name:   Wesley Clark       Date of Birth:   1955/07/26       Age:  64 y.o.      Admit Date: 07/03/2019      Subjective:     Patient presents awake, alert, and in no acute distress. He states that he is feelin better post cystoscopy and bilateral ureteral stents. Pt is off levophed and IVFs decreased to 50cc and has BP 93/72.    He denies any chest pain, shortness of breath, nausea vomiting, fatigue, fever, chills, back pain, or abdomen pain.     ID consult:   Continue IV mero for complicated UTI (cx negative); consider switch to po levaquin   Waiting on HIV genosure test     Nephrology consult:   Cr improved post stents  Decr IVF to 50cc/hr  Cont po NaHC3, ensure, epogen  Start Vit D replacement     Significant labs:  Hgb 8.8  Eos 13 (7 before)   Procal 0.29 (0.15 before)  CRP 1.62 (1.22 before)   Ca 8.0 (7.6 before)  BUN 55 (50 before)  Cr 1.97 (1.8 before)       Objective:     Visit Vitals  BP 93/72 (BP 1 Location: Left upper arm, BP Patient Position: At rest;Lying)   Pulse 87   Temp 97.9 ??F (36.6 ??C)   Resp 20   Ht 5' 8"  (1.727 m)   Wt 105 lb 9.6 oz (47.9 kg)   SpO2 95%   BMI 16.06 kg/m??        Recent Results (from the past 24 hour(s))   GLUCOSE, POC    Collection Time: 07/09/19  3:55 PM   Result Value Ref Range    Glucose (POC) 217 (H) 65 - 100 mg/dL    Performed by FORBES ASHLEY    PROCALCITONIN    Collection Time: 07/10/19  7:07 AM   Result Value Ref Range    Procalcitonin 0.29 (H) 0 ng/mL   C REACTIVE PROTEIN, QT    Collection Time: 07/10/19  7:07 AM   Result Value Ref Range    C-Reactive protein 1.62 (H) 0.00 - 0.60 mg/dL   CBC WITH AUTOMATED DIFF    Collection Time: 07/10/19  7:07 AM   Result Value Ref Range    WBC 6.3 4.1 - 11.1 K/uL    RBC 2.98 (L) 4.10 - 5.70 M/uL    HGB 8.8 (L) 12.1 - 17.0 g/dL    HCT 28.4 (L) 36.6 - 50.3 %    MCV 95.3 80.0 - 99.0 FL    MCH 29.5 26.0 - 34.0 PG    MCHC 31.0 30.0 - 36.5 g/dL    RDW 13.8 11.5 - 14.5 %    PLATELET 285 150 - 400 K/uL     MPV 10.1 8.9 - 12.9 FL    NRBC 0.0 0.0 PER 100 WBC    ABSOLUTE NRBC 0.00 0.00 - 0.01 K/uL    NEUTROPHILS 57 32 - 75 %    LYMPHOCYTES 21 12 - 49 %    MONOCYTES 7 5 - 13 %    EOSINOPHILS 13 (H) 0 - 7 %    BASOPHILS 1 0 - 1 %    IMMATURE GRANULOCYTES 1 (H) 0 - 0.5 %    ABS. NEUTROPHILS 3.6 1.8 - 8.0 K/UL    ABS. LYMPHOCYTES 1.3 0.8 - 3.5 K/UL  ABS. MONOCYTES 0.4 0.0 - 1.0 K/UL    ABS. EOSINOPHILS 0.8 (H) 0.0 - 0.4 K/UL    ABS. BASOPHILS 0.0 0.0 - 0.1 K/UL    ABS. IMM. GRANS. 0.1 (H) 0.00 - 0.04 K/UL    DF AUTOMATED     METABOLIC PANEL, COMPREHENSIVE    Collection Time: 07/10/19  7:07 AM   Result Value Ref Range    Sodium 138 136 - 145 mmol/L    Potassium 4.9 3.5 - 5.1 mmol/L    Chloride 109 (H) 97 - 108 mmol/L    CO2 23 21 - 32 mmol/L    Anion gap 6 5 - 15 mmol/L    Glucose 78 65 - 100 mg/dL    BUN 55 (H) 6 - 20 mg/dL    Creatinine 1.97 (H) 0.70 - 1.30 mg/dL    BUN/Creatinine ratio 28 (H) 12 - 20      GFR est AA 42 (L) >60 ml/min/1.79m    GFR est non-AA 34 (L) >60 ml/min/1.770m   Calcium 8.0 (L) 8.5 - 10.1 mg/dL    Bilirubin, total 0.3 0.2 - 1.0 mg/dL    AST (SGOT) 12 (L) 15 - 37 U/L    ALT (SGPT) 20 12 - 78 U/L    Alk. phosphatase 60 45 - 117 U/L    Protein, total 6.4 6.4 - 8.2 g/dL    Albumin 2.2 (L) 3.5 - 5.0 g/dL    Globulin 4.2 (H) 2.0 - 4.0 g/dL    A-G Ratio 0.5 (L) 1.1 - 2.2     GLUCOSE, POC    Collection Time: 07/10/19  7:39 AM   Result Value Ref Range    Glucose (POC) 88 65 - 100 mg/dL    Performed by SwCorey Skains    @[email or web address] @      Review of Systems    Constitutional: Negative for chills and fever.   HENT: Negative.    Eyes: Negative.    Respiratory: Negative.    Cardiovascular: Negative.    Gastrointestinal: Negative for abdominal pain and nausea.   Skin: Negative.    Neurological: Negative.        Physical Exam:      Constitutional: pt is oriented to person, place, and time.   HENT:   Head: Normocephalic and atraumatic.   Eyes: Pupils are equal, round, and reactive to light. EOM are normal.    Cardiovascular: Normal rate, regular rhythm and normal heart sounds.   Pulmonary/Chest: Breath sounds normal. No wheezes. No rales.   Exhibits no tenderness.   Abdominal: Soft. Bowel sounds are normal. There is no abdominal tenderness. There is no rebound and no guarding.   Musculoskeletal: Normal range of motion.   Neurological: pt is alert and oriented to person, place, and time.     CT ABD PELV WO CONT   Final Result      Bilateral ureteric stents. Bilateral hydronephrosis.      XR FLUOROSCOPY UNDER 60 MINUTES   Final Result      [place]OPERIT[place] COMP   Final Result   1. Bilateral moderate hydronephrosis, similar to previous.   2. Increased left renal echotexture from medical renal disease.      [place] COMP   Final Result   Bilateral hydronephrosis likely due to reflux from distended   bladder.       XR CHEST SNGL V   Final Result           Recent Results (from the  past 24 hour(s))   GLUCOSE, POC    Collection Time: 07/09/19  3:55 PM   Result Value Ref Range    Glucose (POC) 217 (H) 65 - 100 mg/dL    Performed by FORBES ASHLEY    PROCALCITONIN    Collection Time: 07/10/19  7:07 AM   Result Value Ref Range    Procalcitonin 0.29 (H) 0 ng/mL   C REACTIVE PROTEIN, QT    Collection Time: 07/10/19  7:07 AM   Result Value Ref Range    C-Reactive protein 1.62 (H) 0.00 - 0.60 mg/dL   CBC WITH AUTOMATED DIFF    Collection Time: 07/10/19  7:07 AM   Result Value Ref Range    WBC 6.3 4.1 - 11.1 K/uL    RBC 2.98 (L) 4.10 - 5.70 M/uL    HGB 8.8 (L) 12.1 - 17.0 g/dL    HCT 28.4 (L) 36.6 - 50.3 %    MCV 95.3 80.0 - 99.0 FL    MCH 29.5 26.0 - 34.0 PG    MCHC 31.0 30.0 - 36.5 g/dL    RDW 13.8 11.5 - 14.5 %    PLATELET 285 150 - 400 K/uL    MPV 10.1 8.9 - 12.9 FL    NRBC 0.0 0.0 PER 100 WBC    ABSOLUTE NRBC 0.00 0.00 - 0.01 K/uL    NEUTROPHILS 57 32 - 75 %    LYMPHOCYTES 21 12 - 49 %    MONOCYTES 7 5 - 13 %    EOSINOPHILS 13 (H) 0 - 7 %    BASOPHILS 1 0 - 1 %    IMMATURE GRANULOCYTES 1 (H) 0 - 0.5 %    ABS.  NEUTROPHILS 3.6 1.8 - 8.0 K/UL    ABS. LYMPHOCYTES 1.3 0.8 - 3.5 K/UL    ABS. MONOCYTES 0.4 0.0 - 1.0 K/UL    ABS. EOSINOPHILS 0.8 (H) 0.0 - 0.4 K/UL    ABS. BASOPHILS 0.0 0.0 - 0.1 K/UL    ABS. IMM. GRANS. 0.1 (H) 0.00 - 0.04 K/UL    DF AUTOMATED     METABOLIC PANEL, COMPREHENSIVE    Collection Time: 07/10/19  7:07 AM   Result Value Ref Range    Sodium 138 136 - 145 mmol/L    Potassium 4.9 3.5 - 5.1 mmol/L    Chloride 109 (H) 97 - 108 mmol/L    CO2 23 21 - 32 mmol/L    Anion gap 6 5 - 15 mmol/L    Glucose 78 65 - 100 mg/dL    BUN 55 (H) 6 - 20 mg/dL    Creatinine 1.97 (H) 0.70 - 1.30 mg/dL    BUN/Creatinine ratio 28 (H) 12 - 20      GFR est AA 42 (L) >60 ml/min/1.46m    GFR est non-AA 34 (L) >60 ml/min/1.748m   Calcium 8.0 (L) 8.5 - 10.1 mg/dL    Bilirubin, total 0.3 0.2 - 1.0 mg/dL    AST (SGOT) 12 (L) 15 - 37 U/L    ALT (SGPT) 20 12 - 78 U/L    Alk. phosphatase 60 45 - 117 U/L    Protein, total 6.4 6.4 - 8.2 g/dL    Albumin 2.2 (L) 3.5 - 5.0 g/dL    Globulin 4.2 (H) 2.0 - 4.0 g/dL    A-G Ratio 0.5 (L) 1.1 - 2.2     GLUCOSE, POC    Collection Time: 07/10/19  7:39 AM   Result Value Ref Range  Glucose (POC) 88 65 - 100 mg/dL    Performed by Corey Skains        Results     Procedure Component Value Units Date/Time    C. DIFFICILE (DNA) [[id number]] Collected: 07/09/19 0345    Order Status: Completed Specimen: Stool Updated: 07/09/19 0839     C. difficile (DNA) Negative       MRSA SCREEN - PCR (NASAL) [[id number]] Collected: 07/04/19 1543    Order Status: Completed Specimen: Swab Updated: 07/04/19 2203     MRSA by PCR, Nasal Not Detected       COVID-19 RAPID TEST [[id number]] Collected: 07/04/19 0630    Order Status: Completed Specimen: Nasopharyngeal Updated: 07/04/19 0738     Specimen source Nasopharyngeal        COVID-19 rapid test Not Detected        Comment: Rapid Abbott ID Now   Rapid NAAT:  The specimen is NEGATIVE for SARS-CoV-2, the novel coronavirus associated with COVID-19.   Negative results should be  treated as presumptive and, if inconsistent with clinical signs and symptoms or necessary for patient management, should be tested with an alternative molecular assay. Negative results do not preclude SARS-CoV-2 infection and should not be used as the sole basis for patient management decisions.   This test has been authorized by the FDA under   an Emergency Use Authorization (EUA) for use by authorized laboratories. Fact sheet for Healthcare Providers: LittleDVDs.dk Fact sheet for Patients: SatelliteRebate.it   Methodology: Isothermal Nucleic Acid Amplification         CULTURE, URINE [[id number]] Collected: 07/03/19 1615    Order Status: Completed Specimen: Urine Updated: 07/05/19 0838     Special Requests: --        No Special Requests  Reflexed from [id number]       Culture result: No Growth (<1000 cfu/mL)              Labs:     Recent Labs     07/10/19  0707 07/08/19  1200   WBC 6.3 8.7   HGB 8.8* 9.1*   HCT 28.4* 28.7*   PLT 285 227     Recent Labs     07/10/19  0707 07/08/19  1200   NA 138 141   K 4.9 4.0   3.9   CL 109* 112*   CO2 23 22   BUN 55* 50*   CREA 1.97* 1.80*   GLU 78 129*   CA 8.0* 7.6*   MG  --  1.4*     Recent Labs     07/10/19  0707 07/08/19  1200   ALT 20 17   AP 60 55   TBILI 0.3 0.2   TP 6.4 6.0*   ALB 2.2* 2.0*   GLOB 4.2* 4.0     No results for input(s): INR, PTP, APTT, INREXT, INREXT in the last 72 hours.   No results for input(s): FE, TIBC, PSAT, FERR in the last 72 hours.   No results found for: FOL, RBCF   No results for input(s): PH, PCO2, PO2 in the last 72 hours.  No results for input(s): CPK, CKNDX, TROIQ in the last 72 hours.    No lab exists for component: CPKMB  No results found for: CHOL, CHOLX, CHLST, CHOLV, HDL, HDLP, LDL, LDLC, DLDLP, TGLX, TRIGL, TRIGP, CHHD, CHHDX  Lab Results   Component Value Date/Time    Glucose (POC) 88 07/10/2019 07:39 AM    Glucose (POC) 217 (H)  07/09/2019 03:55 PM    Glucose (POC) 77 07/09/2019 08:25 AM     Glucose (POC) 105 (H) 07/07/2019 04:18 PM    Glucose (POC) 123 (H) 07/07/2019 12:01 PM     Lab Results   Component Value Date/Time    Color Yellow/Straw 07/08/2019 06:20 PM    Appearance Turbid (A) 07/08/2019 06:20 PM    Specific gravity 1.011 07/08/2019 06:20 PM    pH (UA) 6.0 07/08/2019 06:20 PM    Protein 30 (A) 07/08/2019 06:20 PM    Glucose Negative 07/08/2019 06:20 PM    Ketone Negative 07/08/2019 06:20 PM    Bilirubin Negative 07/08/2019 06:20 PM    Urobilinogen 0.1 07/08/2019 06:20 PM    Nitrites Negative 07/08/2019 06:20 PM    Leukocyte Esterase Large (A) 07/08/2019 06:20 PM    Bacteria Negative 07/08/2019 06:20 PM    WBC >100 (H) 07/08/2019 06:20 PM    RBC >100 (H) 07/08/2019 06:20 PM         Assessment:     Acute kidney injury  Bilateral hydronephrosis s/p cystoscopy bilateral ureteral stent placement  Hypotension  Urinary retention  Protein calorie malnutrition moderate  Electrolyte disturbances??-hyponatremia, hyperkalemia, hyperphosphatemia, hypermagnesemia  Decreased mobility  History of cardiac disease and pacemaker  History of high blood pressure  History of prostate cancer  Abnormal chest x-ray suggestive of emphysema??-patient does not currently smoke  Cachexia  Hyperkalemia  Anemia hemoglobin 9.1  HIV disease  Reactive RPR/follow with infectious disease    Plan:     Continue medications:   Dapsone 172m po qday   Dolutegravir 564mpo qday   Emtricitaine-tenofovir alafen 1 tab po qday   Epo 10,000 units subq qweek  Ergocalciferol 50,000 units po qweek  Heparin 5000 units subq q8hr  Humalog bid subq  Meropenem 50057mV q12h  Sodium bicarb 650m80m bid   Tamsulosin 0.4mg 63mqhs     Follow with ID and nephrology   Waiting on HIV genosure  Consider increasing IVF or restarting levophed (BP 93/72)    Repeat the labs in the morning   PT OT consult discharge planning    Current Facility-Administered Medications:   ???  loperamide (IMODIUM) capsule 2 mg, 2 mg, Oral, Q6H PRN, Mohiuddin, Abdul, MD  ???  dapsone  tablet 100 mg, 100 mg, Oral, DAILY, HawkeVallarie Mare 100 mg at 07/09/19 0946  ???  dolutegravir (TIVICAY) tablet 50 mg, 50 mg, Oral, DAILY, HawkeVallarie Mare 50 mg at 07/09/19 0947  ???  emtricitabine-tenofovir alafen (DESCOVY) 200-25 mg tablet 1 Tab, 1 Tab, Oral, DAILY, HawkeVallarie Mare 1 Tab at 07/09/19 0900  ???  epoetin alfa-epbx (RETACRIT) injection 10,000 Units, 10,000 Units, SubCUTAneous, Q7D, GibsoGerhard Perches 10,000 Units at 07/08/19 1213  ???  ergocalciferol capsule 50,000 Units, 50,000 Units, Oral, Q7D, GibsoGerhard Perches 50,000 Units at 07/08/19 0940  ???  0.45% sodium chloride infusion, 50 mL/hr, IntraVENous, CONTINUOUS, GibsoGerhard Perches Last Rate: 50 mL/hr at 07/09/19 0445, 50 mL/hr at 07/09/19 0445  ???  tamsulosin (FLOMAX) capsule 0.4 mg, 0.4 mg, Oral, QHS, GibsoGerhard Perches 0.4 mg at 07/09/19 2109  ???  sodium bicarbonate tablet 650 mg, 650 mg, Oral, BID, GibsoGerhard Perches 650 mg at 07/09/19 2109  ???  meropenem (MERREM) 500 mg in sterile water (preservative free) 10 mL IV syringe, 0.5 g, IntraVENous, Q12H, HawkeVallarie Mare 500 mg at 07/09/19 2109  ???  insulin lispro (HUMALOG) injection, , SubCUTAneous, ACB&D, RoachAlbertina Parr  Belenda Cruise, NP, 2 Units at 07/09/19 1607  ???  glucose chewable tablet 16 g, 4 Tab, Oral, PRN, Demetrios Loll, NP  ???  dextrose (D50W) injection syrg 12.5-25 g, 25-50 mL, IntraVENous, PRN, Demetrios Loll, NP  ???  glucagon (GLUCAGEN) injection 1 mg, 1 mg, IntraMUSCular, PRN, Demetrios Loll, NP  ???  sodium chloride (NS) flush 5-40 mL, 5-40 mL, IntraVENous, Q8H, Demetrios Loll, NP, 10 mL at 07/09/19 2110  ???  sodium chloride (NS) flush 5-40 mL, 5-40 mL, IntraVENous, PRN, Demetrios Loll, NP  ???  acetaminophen (TYLENOL) tablet 650 mg, 650 mg, Oral, Q6H PRN **OR** acetaminophen (TYLENOL) suppository 650 mg, 650 mg, Rectal, Q6H PRN, Demetrios Loll, NP  ???  polyethylene glycol (MIRALAX) packet 17 g, 17 g, Oral, DAILY PRN, Demetrios Loll, NP  ???  promethazine (PHENERGAN) tablet  12.5 mg, 12.5 mg, Oral, Q6H PRN **OR** ondansetron (ZOFRAN) injection 4 mg, 4 mg, IntraVENous, Q6H PRN, Demetrios Loll, NP  ???  heparin (porcine) injection 5,000 Units, 5,000 Units, SubCUTAneous, Q8H, Demetrios Loll, NP, 5,000 Units at 07/09/19 2109

## 2019-07-10 NOTE — Discharge Summary (Signed)
General Daily Progress Note          Patient Name:   Wesley Clark       Date of Birth:   1955-09-24       Age:  64 y.o.      Admit Date: 07/03/2019      Subjective:     Patient presents awake, alert, and in no acute distress. He states that he is feelin better post cystoscopy and bilateral ureteral stents. Pt is off levophed and IVFs decreased to 50cc and has BP 93/72.    He denies any chest pain, shortness of breath, nausea vomiting, fatigue, fever, chills, back pain, or abdomen pain.     ID consult:   Continue IV mero for complicated UTI (cx negative); consider switch to po levaquin   Waiting on HIV genosure test     Nephrology consult:   Cr improved post stents  Decr IVF to 50cc/hr  Cont po NaHC3, ensure, epogen  Start Vit D replacement     Significant labs:  Hgb 8.8  Eos 13 (7 before)   Procal 0.29 (0.15 before)  CRP 1.62 (1.22 before)   Ca 8.0 (7.6 before)  BUN 55 (50 before)  Cr 1.97 (1.8 before)       Objective:     Visit Vitals  BP (!) 85/55 (BP 1 Location: Left upper arm, BP Patient Position: At rest;Lying)   Pulse 89   Temp 97.5 ??F (36.4 ??C)   Resp 20   Ht 5' 8"  (1.727 m)   Wt 47.9 kg (105 lb 9.6 oz)   SpO2 98%   BMI 16.06 kg/m??        Recent Results (from the past 24 hour(s))   GLUCOSE, POC    Collection Time: 07/09/19  3:55 PM   Result Value Ref Range    Glucose (POC) 217 (H) 65 - 100 mg/dL    Performed by FORBES ASHLEY    PROCALCITONIN    Collection Time: 07/10/19  7:07 AM   Result Value Ref Range    Procalcitonin 0.29 (H) 0 ng/mL   C REACTIVE PROTEIN, QT    Collection Time: 07/10/19  7:07 AM   Result Value Ref Range    C-Reactive protein 1.62 (H) 0.00 - 0.60 mg/dL   CBC WITH AUTOMATED DIFF    Collection Time: 07/10/19  7:07 AM   Result Value Ref Range    WBC 6.3 4.1 - 11.1 K/uL    RBC 2.98 (L) 4.10 - 5.70 M/uL    HGB 8.8 (L) 12.1 - 17.0 g/dL    HCT 28.4 (L) 36.6 - 50.3 %    MCV 95.3 80.0 - 99.0 FL    MCH 29.5 26.0 - 34.0 PG    MCHC 31.0 30.0 - 36.5 g/dL    RDW 13.8 11.5 - 14.5 %    PLATELET 285 150 - 400  K/uL    MPV 10.1 8.9 - 12.9 FL    NRBC 0.0 0.0 PER 100 WBC    ABSOLUTE NRBC 0.00 0.00 - 0.01 K/uL    NEUTROPHILS 57 32 - 75 %    LYMPHOCYTES 21 12 - 49 %    MONOCYTES 7 5 - 13 %    EOSINOPHILS 13 (H) 0 - 7 %    BASOPHILS 1 0 - 1 %    IMMATURE GRANULOCYTES 1 (H) 0 - 0.5 %    ABS. NEUTROPHILS 3.6 1.8 - 8.0 K/UL    ABS. LYMPHOCYTES 1.3 0.8 - 3.5  K/UL    ABS. MONOCYTES 0.4 0.0 - 1.0 K/UL    ABS. EOSINOPHILS 0.8 (H) 0.0 - 0.4 K/UL    ABS. BASOPHILS 0.0 0.0 - 0.1 K/UL    ABS. IMM. GRANS. 0.1 (H) 0.00 - 0.04 K/UL    DF AUTOMATED     METABOLIC PANEL, COMPREHENSIVE    Collection Time: 07/10/19  7:07 AM   Result Value Ref Range    Sodium 138 136 - 145 mmol/L    Potassium 4.9 3.5 - 5.1 mmol/L    Chloride 109 (H) 97 - 108 mmol/L    CO2 23 21 - 32 mmol/L    Anion gap 6 5 - 15 mmol/L    Glucose 78 65 - 100 mg/dL    BUN 55 (H) 6 - 20 mg/dL    Creatinine 1.97 (H) 0.70 - 1.30 mg/dL    BUN/Creatinine ratio 28 (H) 12 - 20      GFR est AA 42 (L) >60 ml/min/1.82m    GFR est non-AA 34 (L) >60 ml/min/1.75m   Calcium 8.0 (L) 8.5 - 10.1 mg/dL    Bilirubin, total 0.3 0.2 - 1.0 mg/dL    AST (SGOT) 12 (L) 15 - 37 U/L    ALT (SGPT) 20 12 - 78 U/L    Alk. phosphatase 60 45 - 117 U/L    Protein, total 6.4 6.4 - 8.2 g/dL    Albumin 2.2 (L) 3.5 - 5.0 g/dL    Globulin 4.2 (H) 2.0 - 4.0 g/dL    A-G Ratio 0.5 (L) 1.1 - 2.2     GLUCOSE, POC    Collection Time: 07/10/19  7:39 AM   Result Value Ref Range    Glucose (POC) 88 65 - 100 mg/dL    Performed by SwLovingPOC    Collection Time: 07/10/19 11:12 AM   Result Value Ref Range    Glucose (POC) 114 (H) 65 - 100 mg/dL    Performed by SwCorey Skains    @LABCOMPINFO @      Review of Systems    Constitutional: Negative for chills and fever.   HENT: Negative.    Eyes: Negative.    Respiratory: Negative.    Cardiovascular: Negative.    Gastrointestinal: Negative for abdominal pain and nausea.   Skin: Negative.    Neurological: Negative.        Physical Exam:      Constitutional: pt is  oriented to person, place, and time.   HENT:   Head: Normocephalic and atraumatic.   Eyes: Pupils are equal, round, and reactive to light. EOM are normal.   Cardiovascular: Normal rate, regular rhythm and normal heart sounds.   Pulmonary/Chest: Breath sounds normal. No wheezes. No rales.   Exhibits no tenderness.   Abdominal: Soft. Bowel sounds are normal. There is no abdominal tenderness. There is no rebound and no guarding.   Musculoskeletal: Normal range of motion.   Neurological: pt is alert and oriented to person, place, and time.     CT ABD PELV WO CONT   Final Result      Bilateral ureteric stents. Bilateral hydronephrosis.      XR FLUOROSCOPY UNDER 60 MINUTES   Final Result      [place]ETROPERITONEUM COMP   Final Result   1. Bilateral moderate hydronephrosis, similar to previous.   2. Increased left renal echotexture from medical renal disease.      [place]OPER[place]ONEUM COMP   Final Result  Bilateral hydronephrosis likely due to reflux from distended   bladder.       XR CHEST SNGL V   Final Result           Recent Results (from the past 24 hour(s))   GLUCOSE, POC    Collection Time: 07/09/19  3:55 PM   Result Value Ref Range    Glucose (POC) 217 (H) 65 - 100 mg/dL    Performed by FORBES ASHLEY    PROCALCITONIN    Collection Time: 07/10/19  7:07 AM   Result Value Ref Range    Procalcitonin 0.29 (H) 0 ng/mL   C REACTIVE PROTEIN, QT    Collection Time: 07/10/19  7:07 AM   Result Value Ref Range    C-Reactive protein 1.62 (H) 0.00 - 0.60 mg/dL   CBC WITH AUTOMATED DIFF    Collection Time: 07/10/19  7:07 AM   Result Value Ref Range    WBC 6.3 4.1 - 11.1 K/uL    RBC 2.98 (L) 4.10 - 5.70 M/uL    HGB 8.8 (L) 12.1 - 17.0 g/dL    HCT 28.4 (L) 36.6 - 50.3 %    MCV 95.3 80.0 - 99.0 FL    MCH 29.5 26.0 - 34.0 PG    MCHC 31.0 30.0 - 36.5 g/dL    RDW 13.8 11.5 - 14.5 %    PLATELET 285 150 - 400 K/uL    MPV 10.1 8.9 - 12.9 FL    NRBC 0.0 0.0 PER 100 WBC    ABSOLUTE NRBC 0.00 0.00 - 0.01 K/uL    NEUTROPHILS 57 32 - 75 %     LYMPHOCYTES 21 12 - 49 %    MONOCYTES 7 5 - 13 %    EOSINOPHILS 13 (H) 0 - 7 %    BASOPHILS 1 0 - 1 %    IMMATURE GRANULOCYTES 1 (H) 0 - 0.5 %    ABS. NEUTROPHILS 3.6 1.8 - 8.0 K/UL    ABS. LYMPHOCYTES 1.3 0.8 - 3.5 K/UL    ABS. MONOCYTES 0.4 0.0 - 1.0 K/UL    ABS. EOSINOPHILS 0.8 (H) 0.0 - 0.4 K/UL    ABS. BASOPHILS 0.0 0.0 - 0.1 K/UL    ABS. IMM. GRANS. 0.1 (H) 0.00 - 0.04 K/UL    DF AUTOMATED     METABOLIC PANEL, COMPREHENSIVE    Collection Time: 07/10/19  7:07 AM   Result Value Ref Range    Sodium 138 136 - 145 mmol/L    Potassium 4.9 3.5 - 5.1 mmol/L    Chloride 109 (H) 97 - 108 mmol/L    CO2 23 21 - 32 mmol/L    Anion gap 6 5 - 15 mmol/L    Glucose 78 65 - 100 mg/dL    BUN 55 (H) 6 - 20 mg/dL    Creatinine 1.97 (H) 0.70 - 1.30 mg/dL    BUN/Creatinine ratio 28 (H) 12 - 20      GFR est AA 42 (L) >60 ml/min/1.69m    GFR est non-AA 34 (L) >60 ml/min/1.77m   Calcium 8.0 (L) 8.5 - 10.1 mg/dL    Bilirubin, total 0.3 0.2 - 1.0 mg/dL    AST (SGOT) 12 (L) 15 - 37 U/L    ALT (SGPT) 20 12 - 78 U/L    Alk. phosphatase 60 45 - 117 U/L    Protein, total 6.4 6.4 - 8.2 g/dL    Albumin 2.2 (L) 3.5 - 5.0 g/dL  Globulin 4.2 (H) 2.0 - 4.0 g/dL    A-G Ratio 0.5 (L) 1.1 - 2.2     GLUCOSE, POC    Collection Time: 07/10/19  7:39 AM   Result Value Ref Range    Glucose (POC) 88 65 - 100 mg/dL    Performed by Lea, POC    Collection Time: 07/10/19 11:12 AM   Result Value Ref Range    Glucose (POC) 114 (H) 65 - 100 mg/dL    Performed by Corey Skains        Results     Procedure Component Value Units Date/Time    C. DIFFICILE (DNA) [[id number]] Collected: 07/09/19 0345    Order Status: Completed Specimen: Stool Updated: 07/09/19 0839     C. difficile (DNA) Negative       MRSA SCREEN - PCR (NASAL) [[id number]] Collected: 07/04/19 1543    Order Status: Completed Specimen: Swab Updated: 07/04/19 2203     MRSA by PCR, Nasal Not Detected       COVID-19 RAPID TEST [[id number]] Collected: 07/04/19 0630    Order Status:  Completed Specimen: Nasopharyngeal Updated: 07/04/19 0738     Specimen source Nasopharyngeal        COVID-19 rapid test Not Detected        Comment: Rapid Abbott ID Now   Rapid NAAT:  The specimen is NEGATIVE for SARS-CoV-2, the novel coronavirus associated with COVID-19.   Negative results should be treated as presumptive and, if inconsistent with clinical signs and symptoms or necessary for patient management, should be tested with an alternative molecular assay. Negative results do not preclude SARS-CoV-2 infection and should not be used as the sole basis for patient management decisions.   This test has been authorized by the FDA under   an Emergency Use Authorization (EUA) for use by authorized laboratories. Fact sheet for Healthcare Providers: LittleDVDs.dk Fact sheet for Patients: SatelliteRebate.it   Methodology: Isothermal Nucleic Acid Amplification         CULTURE, URINE [[id number]] Collected: 07/03/19 1615    Order Status: Completed Specimen: Urine Updated: 07/05/19 0838     Special Requests: --        No Special Requests  Reflexed from [id number]       Culture result: No Growth (<1000 cfu/mL)              Labs:     Recent Labs     07/10/19  0707 07/08/19  1200   WBC 6.3 8.7   HGB 8.8* 9.1*   HCT 28.4* 28.7*   PLT 285 227     Recent Labs     07/10/19  0707 07/08/19  1200   NA 138 141   K 4.9 4.0   3.9   CL 109* 112*   CO2 23 22   BUN 55* 50*   CREA 1.97* 1.80*   GLU 78 129*   CA 8.0* 7.6*   MG  --  1.4*     Recent Labs     07/10/19  0707 07/08/19  1200   ALT 20 17   AP 60 55   TBILI 0.3 0.2   TP 6.4 6.0*   ALB 2.2* 2.0*   GLOB 4.2* 4.0     No results for input(s): INR, PTP, APTT, INREXT, INREXT in the last 72 hours.   No results for input(s): FE, TIBC, PSAT, FERR in the last 72 hours.   No results found for: FOL, RBCF  No results for input(s): PH, PCO2, PO2 in the last 72 hours.  No results for input(s): CPK, CKNDX, TROIQ in the last 72 hours.    No lab  exists for component: CPKMB  No results found for: CHOL, CHOLX, CHLST, CHOLV, HDL, HDLP, LDL, LDLC, DLDLP, TGLX, TRIGL, TRIGP, CHHD, CHHDX  Lab Results   Component Value Date/Time    Glucose (POC) 114 (H) 07/10/2019 11:12 AM    Glucose (POC) 88 07/10/2019 07:39 AM    Glucose (POC) 217 (H) 07/09/2019 03:55 PM    Glucose (POC) 77 07/09/2019 08:25 AM    Glucose (POC) 105 (H) 07/07/2019 04:18 PM     Lab Results   Component Value Date/Time    Color Yellow/Straw 07/08/2019 06:20 PM    Appearance Turbid (A) 07/08/2019 06:20 PM    Specific gravity 1.011 07/08/2019 06:20 PM    pH (UA) 6.0 07/08/2019 06:20 PM    Protein 30 (A) 07/08/2019 06:20 PM    Glucose Negative 07/08/2019 06:20 PM    Ketone Negative 07/08/2019 06:20 PM    Bilirubin Negative 07/08/2019 06:20 PM    Urobilinogen 0.1 07/08/2019 06:20 PM    Nitrites Negative 07/08/2019 06:20 PM    Leukocyte Esterase Large (A) 07/08/2019 06:20 PM    Bacteria Negative 07/08/2019 06:20 PM    WBC >100 (H) 07/08/2019 06:20 PM    RBC >100 (H) 07/08/2019 06:20 PM         Assessment:     Acute kidney injury  Bilateral hydronephrosis s/p cystoscopy bilateral ureteral stent placement  Hypotension  Urinary retention  Protein calorie malnutrition moderate  Electrolyte disturbances??-hyponatremia, hyperkalemia, hyperphosphatemia, hypermagnesemia  Decreased mobility  History of cardiac disease and pacemaker  History of high blood pressure  History of prostate cancer  Abnormal chest x-ray suggestive of emphysema??-patient does not currently smoke  Cachexia  Hyperkalemia  Anemia hemoglobin 9.1  HIV disease  Reactive RPR/follow with infectious disease    Plan:     Continue medications:   Dapsone 150m po qday   Dolutegravir 552mpo qday   Emtricitaine-tenofovir alafen 1 tab po qday   Epo 10,000 units subq qweek  Ergocalciferol 50,000 units po qweek  Heparin 5000 units subq q8hr  Humalog bid subq  Sodium bicarb 65010mo bid   Tamsulosin 0.4mg41m qhs     Repeat the labs in the morning   PT OT  consult discharge planning    Start on midodrine    If okay with infectious disease patient can discharge home

## 2019-07-10 NOTE — Progress Notes (Signed)
PHYSICAL THERAPY TREATMENT  Patient: Wesley Clark (64 y.o. male)  Date: 07/10/2019  Diagnosis: Renal failure [N19]  AKI (acute kidney injury) ([place]) [N17.9]  Hyperkalemia [E87.5]  Electrolyte imbalance [E87.8] <principal problem not specified>  Procedure(s) (LRB):  CYSTOSCOPY BILATERAL RETROGRADE, BILATERAL URETERAL STENT PLACEMENT (Bilateral) 4 Days Post-Op  Precautions:    Chart, physical therapy assessment, plan of care and goals were reviewed.    ASSESSMENT  Patient continues with skilled PT services and is progressing towards goals. Pt semi supine in bed upon PT/OT arrival and agreeable to session. Performed sup<>sit with Min A for assist with BLE. Completed STS with min Ax2 due to pt unsteady upon standing. Pt ambulated ~5' with RW and min A x 2, took seated rest break and then ambulated another ~10'. No LOB or knee buckling noted, however patient did become shaky towards end of ambulation. Performed seated therex during seated rest break, see details below. Left semi supine in bed with call bell in reach and needs met. Recommending d/c to SNF once medically appropriate.     Current Level of Function Impacting Discharge (mobility/balance): assistance required for all mobility    Other factors to consider for discharge: PLOF, severity of deficits, time since onset, DME         PLAN :  Patient continues to benefit from skilled intervention to address the above impairments.  Continue treatment per established plan of care.  to address goals.    Recommendation for discharge: (in order for the patient to meet his/her long term goals)  Therapy up to 5 days/week in SNF setting    This discharge recommendation:  Has been made in collaboration with the attending provider and/or case management    IF patient discharges home will need the following DME: to be determined (TBD)       SUBJECTIVE:   Patient stated ???i'm having side pain???    OBJECTIVE DATA SUMMARY:   Critical Behavior:  Neurologic State: Alert  Orientation Level:  Oriented X4  Cognition: Follows commands     Functional Mobility Training:  Bed Mobility:  Rolling: Minimum assistance  Supine to Sit: Minimum assistance  Sit to Supine: Minimum assistance  Scooting: Minimum assistance    Transfers:  Sit to Stand: Minimum assistance;Assist x2  Stand to Sit: Minimum assistance;Assist x2    Balance:  Sitting: Impaired  Sitting - Static: Good (unsupported)  Sitting - Dynamic: Fair (occasional)  Standing: Impaired  Standing - Static: Constant support;Fair  Standing - Dynamic : Constant support;Fair    Ambulation/Gait Training:  Distance (ft): 15 Feet (ft)  Assistive Device: Gait belt;Walker, rolling  Ambulation - Level of Assistance: Minimal assistance;Assist x2  Base of Support: Widened  Speed/Cadence: Shuffled;Slow  Step Length: Right shortened;Left shortened      Therapeutic Exercises:   1 x 10 LAQ  1 x 10 Marches  1 x 10 AP    Pain Rating:  Reporting side pain, but no rating given. Pt noted during therex.    Activity Tolerance:   Fair and requires rest breaks  Please refer to the flowsheet for vital signs taken during this treatment.    After treatment patient left in no apparent distress:   Supine in bed, Call bell within reach, and Side rails x 3    COMMUNICATION/COLLABORATION:   The patient???s plan of care was discussed with: Occupational therapy assistant and Registered nurse.     OT/PT sessions occurred together for increased patient and clinician safety    Problem: Mobility Impaired (  Adult and Pediatric)  Goal: *Acute Goals and Plan of Care (Insert Text)  Description: Patient will move from supine to sit and sit to supine , scoot up and down, and roll side to side in bed with supervision/set-up within 7 day(s).    Patient will transfer from bed to chair and chair to bed with supervision/set-up using the least restrictive device within 7 day(s).    Patient will improve static standing balance to minimal assistance within 1 week(s).  Patient will ambulate 50 feet with minimal  assistance with least restrictive device within 1 weeks.    Outcome: Progressing Towards Goal       Lyndal Rainbow, PTA   Time Calculation: 29 mins

## 2019-07-10 NOTE — Progress Notes (Signed)
UROLOGY Progress Note         Graceann Congress, APRN, FNP-C  Urology, [facility]  Supervising Physician: Vevelyn Royals, MD  [phone]      Daily Progress Note: 07/10/2019      Subjective:   The patient is seen for UROLOGIC follow up secondary to bilateral hydronephrosis, hx of prostate cancer and UTI.  ??  He was admitted through the ER on 07/04/19 with complaints of generalized weakness and fatigue with SOB x 2-3 days.    BNP was elevated.  UA showed pyuria without bacteria.    WBC was WNL.  Afebrile on admission, though severely hypotensive.  ??  Initial [place] on 07/03/19 demonstrated bilateral hydronephrosis likely due to reflux from a distended bladder.  The bladder appeared to have sludge. Foley inserted (during exam).  Repeat US on 07/04/19 continued to show bilateral hydronephrosis and bladder decompression s/p foley placement.   ??  Creatinine on admit was 6.84.  ??  He is s/p cystourethroscopy, retrograde pyelograms, and BILATERAL  ureteral stent placement on 07/06/19.  ??  Operative findings: both ureters appeared markedly dilated and tortuous.  Obstructed at the level of the the ureteral orifices.  Severe hydronephrosis noted bilaterally.    Creatinine now 1.97, which appears to be around baseline for him. Foley with 1.5L out/24 hours.  Discharge planning for SNF.      Problem List:  Patient Active Problem List   Diagnosis Code   ??? Renal failure N19   ??? Hyperkalemia E87.5   ??? Electrolyte imbalance E87.8   ??? AKI (acute kidney injury) ([place]) N17.9         Medications reviewed  Current Facility-Administered Medications   Medication Dose Route Frequency   ??? loperamide (IMODIUM) capsule 2 mg  2 mg Oral Q6H PRN   ??? midodrine (PROAMATINE) tablet 10 mg  10 mg Oral TID WITH MEALS   ??? dapsone tablet 100 mg  100 mg Oral DAILY   ??? dolutegravir (TIVICAY) tablet 50 mg  50 mg Oral DAILY   ??? emtricitabine-tenofovir alafen (DESCOVY) 200-25 mg tablet 1 Tab  1 Tab Oral DAILY   ??? epoetin alfa-epbx (RETACRIT) injection 10,000 Units  10,000  Units SubCUTAneous Q7D   ??? ergocalciferol capsule 50,000 Units  50,000 Units Oral Q7D   ??? 0.45% sodium chloride infusion  50 mL/hr IntraVENous CONTINUOUS   ??? tamsulosin (FLOMAX) capsule 0.4 mg  0.4 mg Oral QHS   ??? sodium bicarbonate tablet 650 mg  650 mg Oral BID   ??? meropenem (MERREM) 500 mg in sterile water (preservative free) 10 mL IV syringe  0.5 g IntraVENous Q12H   ??? insulin lispro (HUMALOG) injection   SubCUTAneous ACB&D   ??? glucose chewable tablet 16 g  4 Tab Oral PRN   ??? dextrose (D50W) injection syrg 12.5-25 g  25-50 mL IntraVENous PRN   ??? glucagon (GLUCAGEN) injection 1 mg  1 mg IntraMUSCular PRN   ??? sodium chloride (NS) flush 5-40 mL  5-40 mL IntraVENous Q8H   ??? sodium chloride (NS) flush 5-40 mL  5-40 mL IntraVENous PRN   ??? acetaminophen (TYLENOL) tablet 650 mg  650 mg Oral Q6H PRN    Or   ??? acetaminophen (TYLENOL) suppository 650 mg  650 mg Rectal Q6H PRN   ??? polyethylene glycol (MIRALAX) packet 17 g  17 g Oral DAILY PRN   ??? promethazine (PHENERGAN) tablet 12.5 mg  12.5 mg Oral Q6H PRN    Or   ??? ondansetron (ZOFRAN) injection 4  mg  4 mg IntraVENous Q6H PRN   ??? heparin (porcine) injection 5,000 Units  5,000 Units SubCUTAneous Q8H       Review of Systems:   ROS  Negative unless otherwise mentioned in HPI.      Objective:   Physical Exam   Vitals signs and nursing note reviewed.   Constitutional:       Appearance: Normal appearance.      Comments: Very thin  Mouth/Throat:      Comments: Missing teeth  Eyes:      Extraocular Movements: Extraocular movements intact.      Conjunctiva/sclera: Conjunctivae normal.   Neck:      Musculoskeletal: Normal range of motion.   Cardiovascular:      Comments: Visible implanted device  Pulmonary:      Effort: Pulmonary effort is normal. No respiratory distress.   Abdominal:      General: Abdomen is flat.   Genitourinary:    Comments: Foley  Musculoskeletal: Normal range of motion.   Skin:General: Skin is warm.   Neurological:      General: No focal deficit present.       Mental Status: He is alert and oriented to person, place, and time.       Visit Vitals  BP (!) 85/55 (BP 1 Location: Left upper arm, BP Patient Position: At rest;Lying)   Pulse 89   Temp 97.5 ??F (36.4 ??C)   Resp 20   Ht 5\' 8"  (1.727 m)   Wt 105 lb 9.6 oz (47.9 kg)   SpO2 98%   BMI 16.06 kg/m??         Data Review:       Recent Days:  Recent Labs     07/10/19  0707 07/08/19  1200   WBC 6.3 8.7   HGB 8.8* 9.1*   HCT 28.4* 28.7*   PLT 285 227     Recent Labs     07/10/19  0707 07/08/19  1200   NA 138 141   K 4.9 4.0   3.9   CL 109* 112*   CO2 23 22   GLU 78 129*   BUN 55* 50*   CREA 1.97* 1.80*   CA 8.0* 7.6*   MG  --  1.4*   ALB 2.2* 2.0*   TBILI 0.3 0.2   ALT 20 17       Assessment/     Patient Active Problem List   Diagnosis Code   ??? Renal failure N19   ??? Hyperkalemia E87.5   ??? Electrolyte imbalance E87.8   ??? AKI (acute kidney injury) ([facility]) N17.9       Plan:  BILATERAL HYDRONEPHROSIS:  Now s/p bilateral ureteral stent placement.  Tortuous, dilated ureters with obstruction.  See operative note or HPI for additional op findings.  ??  AKI: admit creatinine was 6.84.  No knowledge of baseline renal function.  Creatinine now 1.97 following bilateral ureteral stent placement.    ??  UTI: marked pyuria on UA without bacteria.  Sludge noted on 09/07/19. Noted bladder inflammation on cystoscopy. Culture with no growth; thought to be false negative.  On meropenem per ID.    Repeat urine culture per ID.  ??  HX OF PROSTATE CANCER: unknown disease status. PSA 2.2 on 07/05/19.  We can attempt to obtain records for review.  Can f/u outpatient. No records to date.    ??  HIV INFECTION: Confirmed diagnosis.  Followed inpatient by ID.  ??  Care  Plan discussed with: Dr. Rozetta Nunnery, MD, Attending Physician      Darden Dates, NP

## 2019-07-10 NOTE — Progress Notes (Signed)
[facility]     Renal Daily Progress Note:     Admission Date: 07/03/2019     Subjective: feels better, BP remains low but he is asymptomatic, eating and drinking without problems. No N/V or abd pain, foley in. Creatinine has dropped nicely after bilateral ureteral stenting. Intake has matched output    Review of Systems  Pertinent items are noted in HPI.    Objective:     Visit Vitals  BP (!) 94/59 (BP 1 Location: Left lower arm, BP Patient Position: At rest;Lying) Comment: second time was 90/58   Pulse 89   Temp 98.4 ??F (36.9 ??C)   Resp 20   Ht 5\' 8"  (1.727 m)   Wt 47.9 kg (105 lb 9.6 oz)   SpO2 94%   BMI 16.06 kg/m??     Temp (24hrs), Avg:98.1 ??F (36.7 ??C), Min:97.5 ??F (36.4 ??C), Max:98.4 ??F (36.9 ??C)        Intake/Output Summary (Last 24 hours) at 07/10/2019 1950  Last data filed at 07/10/2019 1454  Gross per 24 hour   Intake 400 ml   Output 1500 ml   Net -1100 ml     Current Facility-Administered Medications   Medication Dose Route Frequency   ??? loperamide (IMODIUM) capsule 2 mg  2 mg Oral Q6H PRN   ??? midodrine (PROAMATINE) tablet 10 mg  10 mg Oral TID WITH MEALS   ??? dapsone tablet 100 mg  100 mg Oral DAILY   ??? dolutegravir (TIVICAY) tablet 50 mg  50 mg Oral DAILY   ??? emtricitabine-tenofovir alafen (DESCOVY) 200-25 mg tablet 1 Tab  1 Tab Oral DAILY   ??? epoetin alfa-epbx (RETACRIT) injection 10,000 Units  10,000 Units SubCUTAneous Q7D   ??? ergocalciferol capsule 50,000 Units  50,000 Units Oral Q7D   ??? 0.45% sodium chloride infusion  50 mL/hr IntraVENous CONTINUOUS   ??? tamsulosin (FLOMAX) capsule 0.4 mg  0.4 mg Oral QHS   ??? sodium bicarbonate tablet 650 mg  650 mg Oral BID   ??? meropenem (MERREM) 500 mg in sterile water (preservative free) 10 mL IV syringe  0.5 g IntraVENous Q12H   ??? insulin lispro (HUMALOG) injection   SubCUTAneous ACB&D   ??? glucose chewable tablet 16 g  4 Tab Oral PRN   ??? dextrose (D50W) injection syrg 12.5-25 g  25-50 mL IntraVENous PRN   ??? glucagon (GLUCAGEN) injection 1 mg  1 mg  IntraMUSCular PRN   ??? sodium chloride (NS) flush 5-40 mL  5-40 mL IntraVENous Q8H   ??? sodium chloride (NS) flush 5-40 mL  5-40 mL IntraVENous PRN   ??? acetaminophen (TYLENOL) tablet 650 mg  650 mg Oral Q6H PRN    Or   ??? acetaminophen (TYLENOL) suppository 650 mg  650 mg Rectal Q6H PRN   ??? polyethylene glycol (MIRALAX) packet 17 g  17 g Oral DAILY PRN   ??? promethazine (PHENERGAN) tablet 12.5 mg  12.5 mg Oral Q6H PRN    Or   ??? ondansetron (ZOFRAN) injection 4 mg  4 mg IntraVENous Q6H PRN   ??? heparin (porcine) injection 5,000 Units  5,000 Units SubCUTAneous Q8H       Physical Exam:General appearance: alert, cooperative, no distress, appears older than stated age  Lungs: clear to auscultation bilaterally  Heart: regular rate and rhythm, no S3 or S4  Abdomen: soft, non-tender. Bowel sounds normal. No masses,  no organomegaly  Extremities: no edema  Neurologic: Grossly normal    Data Review:  LABS:  Recent Labs     07/10/19  0707 07/08/19  1200   NA 138 141   K 4.9 4.0   3.9   CL 109* 112*   CO2 23 22   BUN 55* 50*   CREA 1.97* 1.80*   CA 8.0* 7.6*   ALB 2.2* 2.0*   MG  --  1.4*   Fe sat 62%  PTH 243,  Vit D2 10.7  PSA 2.2  RPR titer 1:64       Recent Labs     07/10/19  0707 07/08/19  1200   WBC 6.3 8.7   HGB 8.8* 9.1*   HCT 28.4* 28.7*   PLT 285 227     No results for input(s): NAU, KU, CLU, CREAU in the last 72 hours.    No lab exists for component: PROU  Retroperitoneal ultrasound Jul 03, 2019:  History: Renal failure  ??  Retroperitoneal ultrasound was performed. Aorta and IVC appear within normal  limits. Right kidney measures 9.4 x 4.8 x 5.5 cm. There is hydronephrosis. Left  kidney measures 9.3 x 5.1 x 5.0 cm. There is hydronephrosis. The bladder is  distended with a sludge fluid level within it. It measures 8.7 x 5.2 x 5.3 cm.  Foley catheter was placed within the bladder during the examination. There is  some decompression of the bladder. Reevaluation of the kidneys demonstrated  hydronephrosis. It is not  indicated how long before reevaluation post Foley.  ??  IMPRESSION  Bilateral hydronephrosis likely due to reflux from distended  bladder.   ??  CXR 07-03-19:  Chest single view.  ??  Emphysema/bullous cysts. Small volume linear reticular markings left lung base.  No gross interstitial or alveolar pulmonary edema. Left side cardiac device with  leads overlying the heart. No pneumothorax or sizable pleural effusion.  Assessment:   Renal Specific Problems  Probable mild chronic kidney disease at baseline  Acute kidney injury related to obstructive uropathy- resolved  Bladder outlet obstruction with secondary hydronephrosis  bilateral UVJ obstruction  Hyperkalemia related to AKI- resolved  Anemia probably related to renal insufficiency, rule out iron deficiency  Metabolic acidosis  Hyperphosphatemia  Hypoalbuminemia  Unexplained weight loss  History of prostate cancer???treatment protocol not clear- PSA 2.2  Hypotension  HIV  ? Latent syphilis      Plan:     Obtain/ Order: labs/cultures/radiology/procedures:       Therapeutic:    D/c IVF  Oral NaHCO3  Ensure  Start epogen  Start Vit D2 replacement (ergocalciferol)    Dala Dock, MD    [phone]

## 2019-07-10 NOTE — Progress Notes (Signed)
Progress Note    Patient: Wesley Clark MRN: [id number]  SSN: [id number]    Date of Birth: Aug 02, 1955  Age: 64 y.o.  Sex: male      Admit Date: 07/03/2019    LOS: 7 days     Subjective:   Patient followed for complicated UTI and HIV-1 infection but urine culture was negative. Repeat urinalysis showing persistent pyuria but again no bacteria.  He remains afebrile with normal WBC and decreasing procal and CRP. He is currently on IV Meropenem along with HIV medications.  Patient awake and responsive, again sitting up eating lunch.  No complaints.  Objective:     Vitals:    07/10/19 0400 07/10/19 0736 07/10/19 1109 07/10/19 1436   BP: 106/68 93/72 (!) 85/55 (!) 94/59   Pulse: 76 87 89 89   Resp: 20 20 20 20    Temp: 98.2 ??F (36.8 ??C) 97.9 ??F (36.6 ??C) 97.5 ??F (36.4 ??C) 98.4 ??F (36.9 ??C)   SpO2: 98% 95% 98% 94%   Weight:       Height:            Intake and Output:  Current Shift: 05/10 0701 - 05/10 1900  In: 400 [P.O.:400]  Out: 1500 [Urine:1500]  Last three shifts: 05/08 1901 - 05/10 0700  In: -   Out: 1500 [Urine:1500]    Physical Exam:   Vitals signs  and nursing note reviewed.   Constitutional:       General: He is not in acute distress.     Appearance: Normal appearance. He is ill-appearing.   HENT: unremarkable   Neck:      Musculoskeletal: Neck supple.   Cardiovascular:      Rate and Rhythm: Regular rhythm.       Heart sounds: Normal heart sounds. No murmur.   Pulmonary:      Breath sounds: No wheezing, rhonchi or rales.   Abdominal:      General: Abdomen is flat. Bowel sounds are normal.      Palpations: Abdomen is soft.      Tenderness: There is no abdominal tenderness.   Genitourinary:     Comments: Foley catheter  Musculoskeletal:      Right lower leg: No edema.      Left lower leg: No edema.   Skin:     Findings: No lesion or rash.   Neurological:      General: No focal deficit present.      Mental Status: He is alert. He is disoriented.   Psychiatric:         Mood and Affect: Mood normal.         Behavior:  Behavior normal.         Thought Content: Thought content normal.      Lab/Data Review:     WBC 6,300 with 13% eosinophils    Procal 0.29 < 0.15 < 0.30 <0.34  CRP 1.62 <1.22 <1.57 <2.01    Stool C. Diff toxin Negative    HIV-1 antibody Positive  CD4 count 151 (16.8%)  HIV-1 RNA viral load 90,000 copies/ml    Urine culture (5/3) No growth FINAL       Assessment:     Active Problems:    Renal failure (07/03/2019)      Hyperkalemia (07/03/2019)      Electrolyte imbalance (07/03/2019)      AKI (acute kidney injury) (HCC) (07/03/2019)      1. Complicated UTI (bilateral hydronephrosis/distended bladder), with PERSISTENT pyuria, repeat urine  culture pending, Day #6 IV Meropenem.  2. 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 Hypotension, etiology unclear, ?sepsis  4. Renal failure  5. Penicillin allergy  ??  Comment:  Patient has persistent pyuria but again no bacteria. Waiting for repeat urine culture. Procal and CRP decreasing.    Plan:   1. Continue IV Meropenem for complicated UTI  2. In am, repeat procal and CRP  3. Reorder repeat urine culture  4. Continue Tivicay/Descovy and Dapsone  5. Follow-up HIV Genosure resistance profile for possible virologic failure  6. Update Dr. Ricarda Frame, [facility]      Signed By: Lorina Rabon, MD     Jul 10, 2019

## 2019-07-11 LAB — GLUCOSE, POC
Glucose (POC): 102 mg/dL (ref 65–117)
Glucose (POC): 129 mg/dL — ABNORMAL HIGH (ref 65–100)
Glucose (POC): 83 mg/dL (ref 65–117)
Glucose (POC): 96 mg/dL (ref 65–117)

## 2019-07-11 LAB — T PALLIDUM AB: Treponema pallidum Ab: REACTIVE — AB

## 2019-07-11 LAB — PROCALCITONIN: Procalcitonin: 0.29 ng/mL — ABNORMAL HIGH

## 2019-07-11 LAB — C REACTIVE PROTEIN, QT: C-Reactive protein: 1.49 mg/dL — ABNORMAL HIGH (ref 0.00–0.60)

## 2019-07-11 MED FILL — TIVICAY 50 MG TABLET: 50 mg | ORAL | Qty: 1

## 2019-07-11 MED FILL — SODIUM BICARBONATE 650 MG TAB: 650 mg | ORAL | Qty: 1

## 2019-07-11 MED FILL — MIDODRINE 5 MG TAB: 5 mg | ORAL | Qty: 2

## 2019-07-11 MED FILL — DESCOVY 200 MG-25 MG TABLET: 200-25 mg | ORAL | Qty: 1

## 2019-07-11 MED FILL — MEROPENEM 500 MG IV SOLR: 500 mg | INTRAVENOUS | Qty: 500

## 2019-07-11 MED FILL — HEPARIN (PORCINE) 5,000 UNIT/ML IJ SOLN: 5000 unit/mL | INTRAMUSCULAR | Qty: 1

## 2019-07-11 MED FILL — TAMSULOSIN SR 0.4 MG 24 HR CAP: 0.4 mg | ORAL | Qty: 1

## 2019-07-11 MED FILL — DAPSONE 25 MG TAB: 25 mg | ORAL | Qty: 4

## 2019-07-11 NOTE — Progress Notes (Signed)
Progress Note    Patient: Wesley Clark MRN: [id number]  SSN: [id number]    Date of Birth: March 28, 1955  Age: 64 y.o.  Sex: male      Admit Date: 07/03/2019    LOS: 8 days     Subjective:   Patient followed for complicated UTI and HIV-1 infection but urine culture was negative. Repeat urinalysis showing persistent pyuria but again no bacteria.  He remains afebrile with normal WBC and decreasing procal and CRP. RPR reactive with high titer, but he apparently has history of syphilis.  He is currently on IV Meropenem along with HIV medications.  Patient awake and responsive, no complaints.  Objective:     Vitals:    07/10/19 2013 07/11/19 0001 07/11/19 0408 07/11/19 0801   BP: 106/63 108/60 110/65 109/70   Pulse: 87 88 88 86   Resp: 20 21 21 20    Temp: 98.3 ??F (36.8 ??C) 98 ??F (36.7 ??C) 98 ??F (36.7 ??C) 98.1 ??F (36.7 ??C)   SpO2: 97% 99% 100% 95%   Weight:       Height:            Intake and Output:  Current Shift: No intake/output data recorded.  Last three shifts: 05/09 1901 - 05/11 0700  In: 400 [P.O.:400]  Out: 1500 [Urine:1500]    Physical Exam:   Vitals signs  and nursing note reviewed.   Constitutional:       General: He is not in acute distress.     Appearance: Normal appearance. He is ill-appearing.   HENT: unremarkable   Neck:      Musculoskeletal: Neck supple.   Cardiovascular:      Rate and Rhythm: Regular rhythm.       Heart sounds: Normal heart sounds. No murmur.   Pulmonary:      Breath sounds: No wheezing, rhonchi or rales.   Abdominal:      General: Abdomen is flat. Bowel sounds are normal.      Palpations: Abdomen is soft.      Tenderness: There is no abdominal tenderness.   Genitourinary:     Comments: Foley catheter  Musculoskeletal:      Right lower leg: No edema.      Left lower leg: No edema.   Skin:     Findings: No lesion or rash.   Neurological:      General: No focal deficit present.      Mental Status: He is alert. He is disoriented.   Psychiatric:         Mood and Affect: Mood normal.          Behavior: Behavior normal.         Thought Content: Thought content normal.      Lab/Data Review:     WBC 6,300 with 13% eosinophils    Procal 0.29 < 0.15 < 0.30 <0.34  CRP 1.49 <1.62 <1.22 <1.57 <2.01    Stool C. Diff toxin Negative    HIV-1 antibody Positive  CD4 count 151 (16.8%)  HIV-1 RNA viral load 90,000 copies/ml    Urine culture (5/3) No growth FINAL  Urine culture (5/11) Pending    Assessment:     Active Problems:    Renal failure (07/03/2019)      Hyperkalemia (07/03/2019)      Electrolyte imbalance (07/03/2019)      AKI (acute kidney injury) (HCC) (07/03/2019)      1. Complicated UTI (bilateral hydronephrosis/distended bladder), with PERSISTENT pyuria, repeat urine culture  pending, Day #6 IV Meropenem.  2. 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 Hypotension, etiology unclear, ?sepsis  4. Renal failure  5. Penicillin allergy  6. Positive RPR with titer 1:64    Comment:  Patient has prior history of syphilis but details unavailable and last RPR titer unknown.     Plan:   1. Continue IV Meropenem for complicated UTI  2. In am, repeat procal and CRP  3. Follow-up repeat urine culture  4. Continue Tivicay/Descovy and Dapsone  5. Follow-up HIV Genosure resistance profile for possible virologic failure  6. Attempting to reach Dr. Ricarda Frame, [facility] regarding patient's HIV status and RPR    Signed By: Lorina Rabon, MD     Jul 11, 2019

## 2019-07-11 NOTE — Anesthesia Post-Procedure Evaluation (Signed)
Procedure(s):  CYSTOSCOPY BILATERAL RETROGRADE, BILATERAL URETERAL STENT PLACEMENT.    general    Anesthesia Post Evaluation      Multimodal analgesia: multimodal analgesia used between 6 hours prior to anesthesia start to PACU discharge  Patient location during evaluation: PACU  Patient participation: complete - patient participated  Level of consciousness: awake  Pain score: 0  Pain management: adequate  Airway patency: patent  Anesthetic complications: no  Cardiovascular status: acceptable  Respiratory status: acceptable  Hydration status: acceptable  Post anesthesia nausea and vomiting:  controlled  Final Post Anesthesia Temperature Assessment:  Normothermia (36.0-37.5 degrees C)      INITIAL Post-op Vital signs:   Vitals Value Taken Time   BP 110/60 07/06/19 0939   Temp 36 ??C (96.8 ??F) 07/06/19 0919   Pulse 64 07/06/19 0939   Resp 16 07/06/19 0939   SpO2 100 % 07/06/19 7829

## 2019-07-11 NOTE — Progress Notes (Signed)
PHYSICAL THERAPY TREATMENT  Patient: Wesley Clark (64 y.o. male)  Date: 07/11/2019  Diagnosis: Renal failure [N19]  AKI (acute kidney injury) ([place]) [N17.9]  Hyperkalemia [E87.5]  Electrolyte imbalance [E87.8] <principal problem not specified>  Procedure(s) (LRB):  CYSTOSCOPY BILATERAL RETROGRADE, BILATERAL URETERAL STENT PLACEMENT (Bilateral) 5 Days Post-Op  Precautions:    Chart, physical therapy assessment, plan of care and goals were reviewed.    ASSESSMENT  Patient continues with skilled PT services and is progressing towards goals. Pt semi supine in bed upon PT/OT arrival and agreeable to session. Performed sup>sit with min A, scooting in min A, and STS with min A x2. Pt ambulated increased distance this session with RW and min A x 2, slightly unsteady but no overt LOB. Pt completed toileting and hand washing with COTA, see OT note for details. Pt becomes shaky when fatigued. Performed seated therex in chair, however only able to tolerate 2 exercises before becoming to fatigued and requesting to return to bed. Pt left semi supine in bed with call bell in reach and needs met. Recommending d/c to SNF once medically appropriate.    Current Level of Function Impacting Discharge (mobility/balance): assistance required for all mobility    Other factors to consider for discharge: PLOF, severity of deficits, decreased activity tolerance, time since onset.         PLAN :  Patient continues to benefit from skilled intervention to address the above impairments.  Continue treatment per established plan of care.  to address goals.    Recommendation for discharge: (in order for the patient to meet his/her long term goals)  Therapy up to 5 days/week in SNF setting    This discharge recommendation:  Has been made in collaboration with the attending provider and/or case management    IF patient discharges home will need the following DME: to be determined (TBD)       SUBJECTIVE:   Patient stated ???i'll walk.???    OBJECTIVE DATA  SUMMARY:   Critical Behavior:  Neurologic State: Alert  Orientation Level: Oriented X4  Cognition: Appropriate decision making, Follows commands     Functional Mobility Training:  Bed Mobility:  Rolling: Minimum assistance  Supine to Sit: Minimum assistance  Sit to Supine: Minimum assistance  Scooting: Minimum assistance    Transfers:  Sit to Stand: Minimum assistance;Assist x2  Stand to Sit: Minimum assistance;Assist x2  Bed to Chair: Minimum assistance;Assist x2    Balance:  Sitting: Intact  Sitting - Static: Good (unsupported)  Sitting - Dynamic: Fair (occasional)  Standing: Impaired;With support  Standing - Static: Constant support;Fair  Standing - Dynamic : Constant support;Fair    Ambulation/Gait Training:  Distance (ft): 30 Feet (ft)  Assistive Device: Gait belt;Walker, rolling  Ambulation - Level of Assistance: Minimal assistance;Assist x2  Base of Support: Widened  Speed/Cadence: Slow  Step Length: Right shortened;Left shortened    Therapeutic Exercises:   1 x 10 LAQ  1 x 10 AP    Pain Rating:  0/10    Activity Tolerance:   Fair and requires rest breaks  Please refer to the flowsheet for vital signs taken during this treatment.    After treatment patient left in no apparent distress:   Supine in bed, Call bell within reach, Bed / chair alarm activated, and Side rails x 3    COMMUNICATION/COLLABORATION:   The patient???s plan of care was discussed with: Occupational therapy assistant and Registered nurse.     OT/PT sessions occurred together for increased patient  and clinician safety    Problem: Mobility Impaired (Adult and Pediatric)  Goal: *Acute Goals and Plan of Care (Insert Text)  Description: Patient will move from supine to sit and sit to supine , scoot up and down, and roll side to side in bed with supervision/set-up within 7 day(s).    Patient will transfer from bed to chair and chair to bed with supervision/set-up using the least restrictive device within 7 day(s).    Patient will improve static  standing balance to minimal assistance within 1 week(s).  Patient will ambulate 50 feet with minimal assistance with least restrictive device within 1 weeks.    Outcome: Progressing Towards Goal       Lyndal Rainbow, PTA   Time Calculation: 27 mins

## 2019-07-11 NOTE — Progress Notes (Signed)
CM spoke with Wesley Clark at [facility], patient continues to pend insurance auth to go to [facility].    CM continues to follow.

## 2019-07-11 NOTE — Discharge Summary (Signed)
General Daily Progress Note          Patient Name:   Wesley Clark       Date of Birth:   11-11-1955       Age:  64 y.o.      Admit Date: 07/03/2019      Subjective:     Patient presents awake and in no acute distress. He states that he is feeling okay.     Patient is a poor historian and appears confused. He states that he is having some urgency but has been unable to void.  He states that he has been hydrating well.     He denies any chest pain, shortness of breath, nausea vomiting, fatigue, fever, chills, back pain, or abdomen pain.     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        Recent Results (from the past 24 hour(s))   GLUCOSE, POC    Collection Time: 07/10/19  3:32 PM   Result Value Ref Range    Glucose (POC) 131 (H) 65 - 100 mg/dL    Performed by 09/09/19    GLUCOSE, POC    Collection Time: 07/10/19  7:50 PM   Result Value Ref Range    Glucose (POC) 129 (H) 65 - 100 mg/dL    Performed by 09/09/19    PROCALCITONIN    Collection Time: 07/11/19  6:30 AM   Result Value Ref Range    Procalcitonin 0.29 (H) 0 ng/mL   C REACTIVE PROTEIN, QT    Collection Time: 07/11/19  6:30 AM   Result Value Ref Range    C-Reactive protein 1.49 (H) 0.00 - 0.60 mg/dL   GLUCOSE, POC    Collection Time: 07/11/19  8:04 AM   Result Value Ref Range    Glucose (POC) 83 65 - 117 mg/dL    Performed by 09/10/19    GLUCOSE, POC     Collection Time: 07/11/19 12:36 PM   Result Value Ref Range    Glucose (POC) 96 65 - 117 mg/dL    Performed by 09/10/19      @LABCOMPINFO @      Review of Systems    Constitutional: Negative for chills and fever.   HENT: Negative.    Eyes: Negative.    Respiratory: Negative.    Cardiovascular: Negative.    Gastrointestinal: Negative for abdominal pain and nausea.   Skin: Negative.    Neurological: Negative.        Physical Exam:      Constitutional:  pt is disoriented   HENT:   Head: Normocephalic and atraumatic.   Eyes: Pupils are equal, round, and reactive to light. EOM are normal.   Cardiovascular: Normal rate, regular rhythm and normal heart sounds.   Pulmonary/Chest: Breath sounds normal. No wheezes. No rales.   Exhibits no tenderness.   Abdominal: Soft. Bowel sounds are normal. There is no abdominal tenderness. There is no rebound and no guarding.   Musculoskeletal: Normal range of motion.   Neurological: pt is alert and oriented to person, place, and time.     CT ABD PELV WO CONT   Final Result      Bilateral ureteric stents. Bilateral hydronephrosis.      XR FLUOROSCOPY UNDER 60 MINUTES   Final Result      [place] RETROPERITONEUM COMP   Final Result   1. Bilateral moderate hydronephrosis, similar to previous.   2. Increased left renal echotexture from medical renal disease.      [place] RETROPERITONEUM COMP   Final Result   Bilateral hydronephrosis likely due to reflux from distended   bladder.       XR CHEST SNGL V   Final Result           Recent Results (from the past 24 hour(s))   GLUCOSE, POC    Collection Time: 07/10/19  3:32 PM   Result Value Ref Range    Glucose (POC) 131 (H) 65 - 100 mg/dL    Performed by Johann Capers    GLUCOSE, POC    Collection Time: 07/10/19  7:50 PM   Result Value Ref Range    Glucose (POC) 129 (H) 65 - 100 mg/dL    Performed by Tora Duck    PROCALCITONIN    Collection Time: 07/11/19  6:30 AM   Result Value Ref Range    Procalcitonin 0.29 (H) 0 ng/mL   C REACTIVE PROTEIN, QT     Collection Time: 07/11/19  6:30 AM   Result Value Ref Range    C-Reactive protein 1.49 (H) 0.00 - 0.60 mg/dL   GLUCOSE, POC    Collection Time: 07/11/19  8:04 AM   Result Value Ref Range    Glucose (POC) 83 65 - 117 mg/dL    Performed by Consuello Closs    GLUCOSE, POC    Collection Time: 07/11/19 12:36 PM   Result Value Ref Range    Glucose (POC) 96 65 - 117 mg/dL    Performed by Consuello Closs        Results     Procedure Component Value Units Date/Time    CULTURE, URINE [[id number]] Collected: 07/11/19 0330    Order Status: Completed Specimen: Urine Updated: 07/11/19 0350    C. DIFFICILE (DNA) [[id number]] Collected: 07/09/19 0345    Order Status: Completed Specimen: Stool Updated: 07/09/19 0839     C. difficile (DNA) Negative       MRSA SCREEN - PCR (NASAL) [[id number]] Collected: 07/04/19 1543    Order Status: Completed Specimen: Swab Updated: 07/04/19 2203     MRSA by PCR, Nasal Not Detected       COVID-19 RAPID TEST [[id number]] Collected: 07/04/19 0630    Order Status: Completed Specimen: Nasopharyngeal Updated: 07/04/19 0738     Specimen source Nasopharyngeal        COVID-19 rapid test Not Detected        Comment: Rapid Abbott ID Now   Rapid NAAT:  The specimen is NEGATIVE for SARS-CoV-2, the novel coronavirus associated with COVID-19.  Negative results should be treated as presumptive and, if inconsistent with clinical signs and symptoms or necessary for patient management, should be tested with an alternative molecular assay. Negative results do not preclude SARS-CoV-2 infection and should not be used as the sole basis for patient management decisions.   This test has been authorized by the FDA under   an Emergency Use Authorization (EUA) for use by authorized laboratories. Fact sheet for Healthcare Providers: LittleDVDs.dk Fact sheet for Patients: SatelliteRebate.it   Methodology: Isothermal Nucleic Acid Amplification         CULTURE, URINE [[id number]]  Collected: 07/03/19 1615    Order Status: Completed Specimen: Urine Updated: 07/05/19 0838     Special Requests: --        No Special Requests  Reflexed from [id number]       Culture result: No Growth (<1000 cfu/mL)              Labs:     Recent Labs     07/10/19  0707   WBC 6.3   HGB 8.8*   HCT 28.4*   PLT 285     Recent Labs     07/10/19  0707   NA 138   K 4.9   CL 109*   CO2 23   BUN 55*   CREA 1.97*   GLU 78   CA 8.0*     Recent Labs     07/10/19  0707   ALT 20   AP 60   TBILI 0.3   TP 6.4   ALB 2.2*   GLOB 4.2*     No results for input(s): INR, PTP, APTT, INREXT, INREXT in the last 72 hours.   No results for input(s): FE, TIBC, PSAT, FERR in the last 72 hours.   No results found for: FOL, RBCF   No results for input(s): PH, PCO2, PO2 in the last 72 hours.  No results for input(s): CPK, CKNDX, TROIQ in the last 72 hours.    No lab exists for component: CPKMB  No results found for: CHOL, CHOLX, CHLST, CHOLV, HDL, HDLP, LDL, LDLC, DLDLP, TGLX, TRIGL, TRIGP, CHHD, CHHDX  Lab Results   Component Value Date/Time    Glucose (POC) 96 07/11/2019 12:36 PM    Glucose (POC) 83 07/11/2019 08:04 AM    Glucose (POC) 129 (H) 07/10/2019 07:50 PM    Glucose (POC) 131 (H) 07/10/2019 03:32 PM    Glucose (POC) 114 (H) 07/10/2019 11:12 AM     Lab Results   Component Value Date/Time    Color Yellow/Straw 07/08/2019 06:20 PM    Appearance Turbid (A) 07/08/2019 06:20 PM    Specific gravity 1.011 07/08/2019 06:20 PM    pH (UA) 6.0 07/08/2019 06:20 PM    Protein 30 (A) 07/08/2019 06:20 PM    Glucose Negative 07/08/2019 06:20 PM    Ketone Negative 07/08/2019 06:20 PM    Bilirubin Negative 07/08/2019 06:20 PM    Urobilinogen 0.1 07/08/2019 06:20 PM    Nitrites Negative 07/08/2019 06:20 PM    Leukocyte Esterase Large (A) 07/08/2019 06:20 PM    Bacteria Negative 07/08/2019 06:20 PM    WBC >100 (H) 07/08/2019 06:20 PM    RBC >100 (H) 07/08/2019 06:20 PM         Assessment:     Acute kidney injury  Bilateral hydronephrosis s/p cystoscopy bilateral  ureteral stent placement  Hypotension  Urinary retention  Protein calorie malnutrition moderate  Electrolyte disturbances??-hyponatremia, hyperkalemia, hyperphosphatemia, hypermagnesemia  Decreased  mobility  History of cardiac disease and pacemaker  History of high blood pressure  History of prostate cancer  Abnormal chest x-ray suggestive of emphysema??-patient does not currently smoke  Cachexia  Hyperkalemia  Anemia hemoglobin 9.1  HIV disease  Reactive RPR/follow with infectious disease    Plan:     Continue medications:   Dapsone 100mg  po qday   Dolutegravir 50mg  po qday   Emtricitaine-tenofovir alafen 1 tab po qday   Epo 10,000 units subq qweek  Ergocalciferol 50,000 units po qweek  Heparin 5000 units subq q8hr  Humalog bid subq  Meropenem 500mg  IV q12h  Sodium bicarb 650mg  po bid   Tamsulosin 0.4mg  po qhs     Repeat the labs in the morning   PT OT consult discharge planning with SNF     Discussed with case manager waiting for authorization to go to nursing home      If okay with infectious disease patient can discharge home

## 2019-07-11 NOTE — Progress Notes (Signed)
UROLOGY Progress Note         Darden Dates, APRN, FNP-C  Urology, [facility]  Supervising Physician: Rozetta Nunnery, MD  [phone]      Daily Progress Note: 07/11/2019      Subjective:   The patient is seen for UROLOGIC follow up secondary to bilateral hydronephrosis, hx of prostate cancer and UTI.  ??  He was admitted through the ER on 07/04/19 with complaints of generalized weakness and fatigue with SOB x 2-3 days.    BNP was elevated.  UA showed pyuria without bacteria.    WBC was WNL.  Afebrile on admission, though severely hypotensive.  ??  Initial [place] on 07/03/19 demonstrated bilateral hydronephrosis likely due to reflux from a distended bladder.  The bladder appeared to have sludge. Foley inserted (during exam).  Repeat US on 07/04/19 continued to show bilateral hydronephrosis and bladder decompression s/p foley placement.   ??  Creatinine on admit was 6.84.  ??  He is s/p cystourethroscopy, retrograde pyelograms, and BILATERAL  ureteral stent placement on 07/06/19.  ??  Operative findings: both ureters appeared markedly dilated and tortuous.  Obstructed at the level of the the ureteral orifices.  Severe hydronephrosis noted bilaterally.    Creatinine now 1.97, which appears to be around baseline for him. Foley with 1.5L out/24 hours.  Discharge planning for SNF.  Resting comfortably at this time.      Problem List:  Patient Active Problem List   Diagnosis Code   ??? Renal failure N19   ??? Hyperkalemia E87.5   ??? Electrolyte imbalance E87.8   ??? AKI (acute kidney injury) (HCC) N17.9         Medications reviewed  Current Facility-Administered Medications   Medication Dose Route Frequency   ??? loperamide (IMODIUM) capsule 2 mg  2 mg Oral Q6H PRN   ??? midodrine (PROAMATINE) tablet 10 mg  10 mg Oral TID WITH MEALS   ??? dapsone tablet 100 mg  100 mg Oral DAILY   ??? dolutegravir (TIVICAY) tablet 50 mg  50 mg Oral DAILY   ??? emtricitabine-tenofovir alafen (DESCOVY) 200-25 mg tablet 1 Tab  1 Tab Oral DAILY   ??? epoetin alfa-epbx  (RETACRIT) injection 10,000 Units  10,000 Units SubCUTAneous Q7D   ??? ergocalciferol capsule 50,000 Units  50,000 Units Oral Q7D   ??? tamsulosin (FLOMAX) capsule 0.4 mg  0.4 mg Oral QHS   ??? sodium bicarbonate tablet 650 mg  650 mg Oral BID   ??? meropenem (MERREM) 500 mg in sterile water (preservative free) 10 mL IV syringe  0.5 g IntraVENous Q12H   ??? insulin lispro (HUMALOG) injection   SubCUTAneous ACB&D   ??? glucose chewable tablet 16 g  4 Tab Oral PRN   ??? dextrose (D50W) injection syrg 12.5-25 g  25-50 mL IntraVENous PRN   ??? glucagon (GLUCAGEN) injection 1 mg  1 mg IntraMUSCular PRN   ??? sodium chloride (NS) flush 5-40 mL  5-40 mL IntraVENous Q8H   ??? sodium chloride (NS) flush 5-40 mL  5-40 mL IntraVENous PRN   ??? acetaminophen (TYLENOL) tablet 650 mg  650 mg Oral Q6H PRN    Or   ??? acetaminophen (TYLENOL) suppository 650 mg  650 mg Rectal Q6H PRN   ??? polyethylene glycol (MIRALAX) packet 17 g  17 g Oral DAILY PRN   ??? promethazine (PHENERGAN) tablet 12.5 mg  12.5 mg Oral Q6H PRN    Or   ??? ondansetron (ZOFRAN) injection 4 mg  4 mg IntraVENous Q6H  PRN   ??? heparin (porcine) injection 5,000 Units  5,000 Units SubCUTAneous Q8H       Review of Systems:   ROS  Negative unless otherwise mentioned in HPI.      Objective:   Physical Exam   Vitals signs and nursing note reviewed.   Constitutional:       Appearance: Normal appearance.      Comments: Very thin  Mouth/Throat:      Comments: Missing teeth  Cardiovascular:      Comments: Visible implanted device  Pulmonary:      Effort: Pulmonary effort is normal. No respiratory distress.   Abdominal:      General: Abdomen is flat.   Genitourinary:    Comments: Foley;  Musculoskeletal: Normal range of motion.   Skin:General: Skin is warm.     Visit Vitals  BP 109/70 (BP Patient Position: At rest;Semi fowlers)   Pulse 86   Temp 98.1 ??F (36.7 ??C)   Resp 20   Ht 5\' 8"  (1.727 m)   Wt 105 lb 9.6 oz (47.9 kg)   SpO2 95%   BMI 16.06 kg/m??         Data Review:       Recent Days:  Recent Labs      07/10/19  0707 07/08/19  1200   WBC 6.3 8.7   HGB 8.8* 9.1*   HCT 28.4* 28.7*   PLT 285 227     Recent Labs     07/10/19  0707 07/08/19  1200   NA 138 141   K 4.9 4.0   3.9   CL 109* 112*   CO2 23 22   GLU 78 129*   BUN 55* 50*   CREA 1.97* 1.80*   CA 8.0* 7.6*   MG  --  1.4*   ALB 2.2* 2.0*   TBILI 0.3 0.2   ALT 20 17       Assessment/     Patient Active Problem List   Diagnosis Code   ??? Renal failure N19   ??? Hyperkalemia E87.5   ??? Electrolyte imbalance E87.8   ??? AKI (acute kidney injury) ([place]) N17.9       Plan:  BILATERAL HYDRONEPHROSIS:  Now s/p bilateral ureteral stent placement.  Tortuous, dilated ureters with obstruction.  See operative note or HPI for additional op findings.  ??  AKI: admit creatinine was 6.84.  No knowledge of baseline renal function.  Creatinine now 1.97 following bilateral ureteral stent placement.    ??  UTI: marked pyuria on UA without bacteria.  Sludge noted on [place]. Noted bladder inflammation on cystoscopy. Culture with no growth; thought to be false negative.  On meropenem per ID.    Repeat urine culture per ID; pending.  ??  HX OF PROSTATE CANCER: unknown disease status. PSA 2.2 on 07/05/19.  We can attempt to obtain records for review.  Can f/u outpatient. No records to date.    ??  HIV INFECTION: Confirmed diagnosis.  Followed inpatient by ID.  ??  Care Plan discussed with: Dr. Vevelyn Royals, MD, Attending Physician      Graceann Congress, NP

## 2019-07-11 NOTE — Progress Notes (Signed)
OCCUPATIONAL THERAPY TREATMENT  Patient: Wesley Clark (64 y.o. male)  Date: 07/11/2019  Diagnosis: Renal failure [N19]  AKI (acute kidney injury) ([place]) [N17.9]  Hyperkalemia [E87.5]  Electrolyte imbalance [E87.8] <principal problem not specified>  Procedure(s) (LRB):  CYSTOSCOPY BILATERAL RETROGRADE, BILATERAL URETERAL STENT PLACEMENT (Bilateral) 5 Days Post-Op  Precautions:    Chart, occupational therapy assessment, plan of care, and goals were reviewed.    ASSESSMENT  Patient continues with skilled OT services and is progressing towards goals.  Pt. Received semi-supine in bed and agreeable to therapy session. Pt. Performed bed mobility with mod A,increased assistance with scooting to EOB, sit-> stand min A x2, functional ambulation min A x2 with use of RW, toilet transfer Min A with use of grab bars, increased directional cues to maneuver RW move walker  to the front of sink, chair transfer min A-  pt completed UE and LE therex while seated in chair. Cues for up-right posterior and ROM in neck region in an attempt to decrease neck flexion. Pt. Performed chair to bed transfer with min A x2. Therapist assisted pt with changing gown with min A.     Current Level of Function Impacting Discharge (ADLs): unsteady functional ambulation requiring increased assistance, min  A toilet transfers    Other factors to consider for discharge: PLOF, time since on set, severity of deficits         PLAN :  Patient continues to benefit from skilled intervention to address the above impairments.  Continue treatment per established plan of care.  to address goals.    Recommendation for discharge: (in order for the patient to meet his/her long term goals)  Therapy up to 5 days/week in SNF setting    This discharge recommendation:  Has been made in collaboration with the attending provider and/or case management    IF patient discharges home will need the following DME: TBD       SUBJECTIVE:   Patient stated ???ill try to use the bathroom, I  haven't gone in 2 days.???    OBJECTIVE DATA SUMMARY:   Cognitive/Behavioral Status:  Neurologic State: Alert  Orientation Level: Oriented X4  Cognition: Appropriate decision making;Follows commands    Functional Mobility and Transfers for ADLs:  Bed Mobility:  Rolling: Minimum assistance  Supine to Sit: Minimum assistance  Sit to Supine: Minimum assistance  Scooting: Minimum assistance    Transfers:  Sit to Stand: Minimum assistance;Assist x2  Functional Transfers  Bathroom Mobility: Minimum assistance  Toilet Transfer : Minimum assistance  Adaptive Equipment: Grab bars  Bed to Chair: Minimum assistance;Assist x2    Balance:  Sitting: Intact  Sitting - Static: Good (unsupported)  Sitting - Dynamic: Fair (occasional)  Standing: Impaired;With support  Standing - Static: Constant support;Fair  Standing - Dynamic : Constant support;Fair    ADL Intervention:  Grooming  Grooming Assistance: Minimum assistance  Position Performed: Standing  Washing Hands: Minimum assistance      Upper Body [facility] Gown: Minimum  assistance      Therapeutic Exercises: BIL UE's  Exercise Sets Reps AROM AAROM PROM Self PROM Comments   Shoulder flex/ext 1 15 [x]  []  []  []     Elbow flex/ext 1 15 [x]  []  []  []     Wrist flex/ext   []  []  []  []        []  []  []  []         Pain:  0/10  Activity Tolerance:   Fair  Please refer to the flowsheet for vital  signs taken during this treatment.    After treatment patient left in no apparent distress:   Supine in bed, Call bell within reach, Bed / chair alarm activated, and Side rails x 3    COMMUNICATION/COLLABORATION:   The patient???s plan of care was discussed with: Physical therapy assistant. Co-tx for increased assistance with functional ambulation      Jenell Milliner  Time Calculation: 27 mins    Problem: Self Care Deficits Care Plan (Adult)  Goal: *Acute Goals and Plan of Care (Insert Text)  Description: Pt will be SBA sup <> sit in prep for EOB ADLs  Pt will be SBA grooming sitting  EOB  Pt will be SBA LE dressing sitting EOB/long sit  Pt will be SBA sit <> stand in prep for toileting LRAD  Pt will be SBA toileting/toilet transfer/cloth mgmt LRAD  Pt will be IND following UE HEP in prep for self care tasks  Outcome: Progressing Towards Goal

## 2019-07-11 NOTE — Discharge Summary (Cosign Needed)
General Daily Progress Note          Patient Name:   Wesley Clark       Date of Birth:   February 12, 1956       Age:  64 y.o.      Admit Date: 07/03/2019      Subjective:     Patient presents awake and in no acute distress. He states that he is feeling okay.     Patient is a poor historian and appears confused. He states that he is having some urgency but has been unable to void.  He states that he has been hydrating well.     He denies any chest pain, shortness of breath, nausea vomiting, fatigue, fever, chills, back pain, or abdomen pain.     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        Recent Results (from the past 24 hour(s))   GLUCOSE, POC    Collection Time: 07/10/19  3:32 PM   Result Value Ref Range    Glucose (POC) 131 (H) 65 - 100 mg/dL    Performed by 09/09/19    GLUCOSE, POC    Collection Time: 07/10/19  7:50 PM   Result Value Ref Range    Glucose (POC) 129 (H) 65 - 100 mg/dL    Performed by 09/09/19    PROCALCITONIN    Collection Time: 07/11/19  6:30 AM   Result Value Ref Range    Procalcitonin 0.29 (H) 0 ng/mL   C REACTIVE PROTEIN, QT    Collection Time: 07/11/19  6:30 AM   Result Value Ref Range    C-Reactive protein 1.49 (H) 0.00 - 0.60 mg/dL   GLUCOSE, POC    Collection Time: 07/11/19  8:04 AM   Result Value Ref Range    Glucose (POC) 83 65 - 117 mg/dL    Performed by 09/10/19      @LABCOMPINFO @       Review of Systems    Constitutional: Negative for chills and fever.   HENT: Negative.    Eyes: Negative.    Respiratory: Negative.    Cardiovascular: Negative.    Gastrointestinal: Negative for abdominal pain and nausea.   Skin: Negative.    Neurological: Negative.        Physical Exam:      Constitutional: pt is disoriented   HENT:   Head: Normocephalic and atraumatic.   Eyes: Pupils are equal, round, and reactive to light. EOM are normal.   Cardiovascular: Normal rate, regular rhythm and normal heart sounds.  Pulmonary/Chest: Breath sounds normal. No wheezes. No rales.   Exhibits no tenderness.   Abdominal: Soft. Bowel sounds are normal. There is no abdominal tenderness. There is no rebound and no guarding.   Musculoskeletal: Normal range of motion.   Neurological: pt is alert and oriented to person, place, and time.     CT ABD PELV WO CONT   Final Result      Bilateral ureteric stents. Bilateral hydronephrosis.      XR FLUOROSCOPY UNDER 60 MINUTES   Final Result      [place] RETROPERITONEUM COMP   Final Result   1. Bilateral moderate hydronephrosis, similar to previous.   2. Increased left renal echotexture from medical renal disease.      [place] RETROPERITONEUM COMP   Final Result   Bilateral hydronephrosis likely due to reflux from distended   bladder.       XR CHEST SNGL V   Final Result           Recent Results (from the past 24 hour(s))   GLUCOSE, POC    Collection Time: 07/10/19  3:32 PM   Result Value Ref Range    Glucose (POC) 131 (H) 65 - 100 mg/dL    Performed by Grover Canavan    GLUCOSE, POC    Collection Time: 07/10/19  7:50 PM   Result Value Ref Range    Glucose (POC) 129 (H) 65 - 100 mg/dL    Performed by Daiva Nakayama    PROCALCITONIN    Collection Time: 07/11/19  6:30 AM   Result Value Ref Range    Procalcitonin 0.29 (H) 0 ng/mL   C REACTIVE PROTEIN, QT    Collection Time: 07/11/19  6:30 AM   Result Value Ref Range    C-Reactive protein 1.49 (H) 0.00 - 0.60 mg/dL   GLUCOSE, POC    Collection Time: 07/11/19   8:04 AM   Result Value Ref Range    Glucose (POC) 83 65 - 117 mg/dL    Performed by Joya Gaskins        Results     Procedure Component Value Units Date/Time    CULTURE, URINE [[id number]] Collected: 07/11/19 0330    Order Status: Completed Specimen: Urine Updated: 07/11/19 0350    C. DIFFICILE (DNA) [[id number]] Collected: 07/09/19 0345    Order Status: Completed Specimen: Stool Updated: 07/09/19 0839     C. difficile (DNA) Negative       MRSA SCREEN - PCR (NASAL) [[id number]] Collected: 07/04/19 1543    Order Status: Completed Specimen: Swab Updated: 07/04/19 2203     MRSA by PCR, Nasal Not Detected       COVID-19 RAPID TEST [[id number]] Collected: 07/04/19 0630    Order Status: Completed Specimen: Nasopharyngeal Updated: 07/04/19 0738     Specimen source Nasopharyngeal        COVID-19 rapid test Not Detected        Comment: Rapid Abbott ID Now   Rapid NAAT:  The specimen is NEGATIVE for SARS-CoV-2, the novel coronavirus associated with COVID-19.   Negative results should be treated as presumptive and, if inconsistent with clinical signs and symptoms or necessary for patient management, should be tested with an alternative molecular assay. Negative results do not preclude SARS-CoV-2 infection and should not be used as the sole basis for patient management decisions.   This test has been authorized by the FDA under   an Emergency Use Authorization (EUA) for use by authorized laboratories. Fact sheet for Healthcare  Providers: FlickSafe.gl Fact sheet for Patients: [place]   Methodology: Isothermal Nucleic Acid Amplification         CULTURE, URINE [[id number]] Collected: 07/03/19 1615    Order Status: Completed Specimen: Urine Updated: 07/05/19 0838     Special Requests: --        No Special Requests  Reflexed from [id number]       Culture result: No Growth (<1000 cfu/mL)              Labs:     Recent Labs     07/10/19  0707 07/08/19  1200   WBC 6.3 8.7   HGB  8.8* 9.1*   HCT 28.4* 28.7*   PLT 285 227     Recent Labs     07/10/19  0707 07/08/19  1200   NA 138 141   K 4.9 4.0   3.9   CL 109* 112*   CO2 23 22   BUN 55* 50*   CREA 1.97* 1.80*   GLU 78 129*   CA 8.0* 7.6*   MG  --  1.4*     Recent Labs     07/10/19  0707 07/08/19  1200   ALT 20 17   AP 60 55   TBILI 0.3 0.2   TP 6.4 6.0*   ALB 2.2* 2.0*   GLOB 4.2* 4.0     No results for input(s): INR, PTP, APTT, INREXT, INREXT in the last 72 hours.   No results for input(s): FE, TIBC, PSAT, FERR in the last 72 hours.   No results found for: FOL, RBCF   No results for input(s): PH, PCO2, PO2 in the last 72 hours.  No results for input(s): CPK, CKNDX, TROIQ in the last 72 hours.    No lab exists for component: CPKMB  No results found for: CHOL, CHOLX, CHLST, CHOLV, HDL, HDLP, LDL, LDLC, DLDLP, TGLX, TRIGL, TRIGP, CHHD, CHHDX  Lab Results   Component Value Date/Time    Glucose (POC) 83 07/11/2019 08:04 AM    Glucose (POC) 129 (H) 07/10/2019 07:50 PM    Glucose (POC) 131 (H) 07/10/2019 03:32 PM    Glucose (POC) 114 (H) 07/10/2019 11:12 AM    Glucose (POC) 88 07/10/2019 07:39 AM     Lab Results   Component Value Date/Time    Color Yellow/Straw 07/08/2019 06:20 PM    Appearance Turbid (A) 07/08/2019 06:20 PM    Specific gravity 1.011 07/08/2019 06:20 PM    pH (UA) 6.0 07/08/2019 06:20 PM    Protein 30 (A) 07/08/2019 06:20 PM    Glucose Negative 07/08/2019 06:20 PM    Ketone Negative 07/08/2019 06:20 PM    Bilirubin Negative 07/08/2019 06:20 PM    Urobilinogen 0.1 07/08/2019 06:20 PM    Nitrites Negative 07/08/2019 06:20 PM    Leukocyte Esterase Large (A) 07/08/2019 06:20 PM    Bacteria Negative 07/08/2019 06:20 PM    WBC >100 (H) 07/08/2019 06:20 PM    RBC >100 (H) 07/08/2019 06:20 PM         Assessment:     Acute kidney injury  Bilateral hydronephrosis s/p cystoscopy bilateral ureteral stent placement  Hypotension  Urinary retention  Protein calorie malnutrition moderate  Electrolyte disturbances??-hyponatremia, hyperkalemia,  hyperphosphatemia, hypermagnesemia  Decreased mobility  History of cardiac disease and pacemaker  History of high blood pressure  History of prostate cancer  Abnormal chest x-ray suggestive of emphysema??-patient does not currently smoke  Cachexia  Hyperkalemia  Anemia hemoglobin 9.1  HIV disease  Reactive RPR/follow with infectious disease    Plan:     Continue medications:   Dapsone 100mg  po qday   Dolutegravir 50mg  po qday   Emtricitaine-tenofovir alafen 1 tab po qday   Epo 10,000 units subq qweek  Ergocalciferol 50,000 units po qweek  Heparin 5000 units subq q8hr  Humalog bid subq  Meropenem 500mg  IV q12h  Sodium bicarb 650mg  po bid   Tamsulosin 0.4mg  po qhs     Repeat the labs in the morning   PT OT consult discharge planning with SNF       If okay with infectious disease patient can discharge home

## 2019-07-11 NOTE — Progress Notes (Signed)
Urine culture collected and same send to lab. Result pending.

## 2019-07-12 LAB — GLUCOSE, POC
Glucose (POC): 114 mg/dL (ref 65–117)
Glucose (POC): 133 mg/dL — ABNORMAL HIGH (ref 65–117)
Glucose (POC): 186 mg/dL — ABNORMAL HIGH (ref 65–117)
Glucose (POC): 265 mg/dL — ABNORMAL HIGH (ref 65–117)

## 2019-07-12 LAB — RENAL FUNCTION PANEL
Albumin: 2.4 g/dL — ABNORMAL LOW (ref 3.5–5.0)
Anion gap: 4 mmol/L — ABNORMAL LOW (ref 5–15)
BUN/Creatinine ratio: 26 — ABNORMAL HIGH (ref 12–20)
BUN: 59 mg/dL — ABNORMAL HIGH (ref 6–20)
CO2: 27 mmol/L (ref 21–32)
Calcium: 8.4 mg/dL — ABNORMAL LOW (ref 8.5–10.1)
Chloride: 106 mmol/L (ref 97–108)
Creatinine: 2.23 mg/dL — ABNORMAL HIGH (ref 0.70–1.30)
GFR est AA: 36 mL/min/{1.73_m2} — ABNORMAL LOW (ref >60)
GFR est non-AA: 30 mL/min/{1.73_m2} — ABNORMAL LOW (ref >60)
Glucose: 111 mg/dL — ABNORMAL HIGH (ref 65–100)
Phosphorus: 3.4 mg/dL (ref 2.6–4.7)
Potassium: 5.1 mmol/L (ref 3.5–5.1)
Sodium: 137 mmol/L (ref 136–145)

## 2019-07-12 LAB — CULTURE, URINE: Culture result:: NO GROWTH

## 2019-07-12 MED FILL — SODIUM BICARBONATE 650 MG TAB: 650 mg | ORAL | Qty: 1

## 2019-07-12 MED FILL — HUMALOG U-100 INSULIN 100 UNIT/ML SUBCUTANEOUS SOLUTION: 100 unit/mL | SUBCUTANEOUS | Qty: 3

## 2019-07-12 MED FILL — MIDODRINE 5 MG TAB: 5 mg | ORAL | Qty: 2

## 2019-07-12 MED FILL — DAPSONE 25 MG TAB: 25 mg | ORAL | Qty: 4

## 2019-07-12 MED FILL — TAMSULOSIN SR 0.4 MG 24 HR CAP: 0.4 mg | ORAL | Qty: 1

## 2019-07-12 MED FILL — MEROPENEM 500 MG IV SOLR: 500 mg | INTRAVENOUS | Qty: 500

## 2019-07-12 MED FILL — HEPARIN (PORCINE) 5,000 UNIT/ML IJ SOLN: 5000 unit/mL | INTRAMUSCULAR | Qty: 1

## 2019-07-12 MED FILL — TIVICAY 50 MG TABLET: 50 mg | ORAL | Qty: 1

## 2019-07-12 MED FILL — DESCOVY 200 MG-25 MG TABLET: 200-25 mg | ORAL | Qty: 1

## 2019-07-12 NOTE — Discharge Summary (Cosign Needed)
General Daily Progress Note          Patient Name:   Wesley Clark       Date of Birth:   February 10, 1956       Age:  64 y.o.      Admit Date: 07/03/2019      Subjective:     Patient presents awake and in no acute distress. He states that he is feeling okay.     Patient is a poor historian and appears confused. He states "oh yeah" to every question asked.     He denies any chest pain, shortness of breath, nausea vomiting, fatigue, fever, chills, back pain, or abdomen pain.     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        Recent Results (from the past 24 hour(s))   GLUCOSE, POC    Collection Time: 07/11/19 12:36 PM   Result Value Ref Range    Glucose (POC) 96 65 - 117 mg/dL    Performed by 09/10/19    GLUCOSE, POC    Collection Time: 07/11/19  3:30 PM   Result Value Ref Range    Glucose (POC) 102 65 - 117 mg/dL    Performed by 09/10/19    GLUCOSE, POC    Collection Time: 07/11/19  8:14 PM   Result Value Ref Range    Glucose (POC) 114 65 - 117 mg/dL    Performed by 09/10/19    RENAL FUNCTION PANEL    Collection Time: 07/12/19  7:06 AM   Result Value Ref Range    Sodium 137 136 - 145 mmol/L    Potassium 5.1 3.5 - 5.1 mmol/L    Chloride 106 97 - 108 mmol/L    CO2 27 21 - 32 mmol/L    Anion gap 4 (L) 5 - 15 mmol/L    Glucose 111 (H) 65 - 100 mg/dL    BUN 59 (H) 6 - 20 mg/dL    Creatinine 09/11/19 (H) 0.70 - 1.30 mg/dL    BUN/Creatinine ratio 26 (H) 12 - 20      GFR est AA 36 (L) >60 ml/min/1.30m2    GFR est non-AA 30 (L) >60 ml/min/1.33m2     Calcium 8.4 (L) 8.5 - 10.1 mg/dL    Phosphorus 3.4 2.6 - 4.7 mg/dL    Albumin 2.4 (L) 3.5 - 5.0 g/dL   GLUCOSE, POC    Collection Time: 07/12/19  7:06 AM   Result Value Ref Range    Glucose (POC) 133 (H) 65 - 117 mg/dL    Performed by 09/11/19      @LABCOMPINFO @      Review of Systems  Unable to assess  Physical Exam:      Constitutional: pt is disoriented   HENT:   Head: Normocephalic and atraumatic.   Eyes: Pupils are equal, round, and reactive to light. EOM are normal.   Cardiovascular: Normal rate, regular rhythm and normal heart sounds.   Pulmonary/Chest: Breath sounds normal. No wheezes. No rales.   Exhibits no tenderness.   Abdominal: Soft. Bowel sounds are normal. There is no abdominal tenderness. There is no rebound and no guarding.   Musculoskeletal: Normal range of motion.   Neurological: pt is alert and oriented to person, place, and time.     CT ABD PELV WO CONT   Final Result      Bilateral ureteric stents. Bilateral hydronephrosis.      XR FLUOROSCOPY UNDER 60 MINUTES   Final Result      [place] RETROPERITONEUM COMP   Final Result   1. Bilateral moderate hydronephrosis, similar to previous.   2. Increased left renal echotexture from medical renal disease.      [place] RETROPERITONEUM COMP   Final Result   Bilateral hydronephrosis likely due to reflux from distended   bladder.       XR CHEST SNGL V   Final Result           Recent Results (from the past 24 hour(s))   GLUCOSE, POC    Collection Time: 07/11/19 12:36 PM   Result Value Ref Range    Glucose (POC) 96 65 - 117 mg/dL    Performed by Consuello Closs    GLUCOSE, POC    Collection Time: 07/11/19  3:30 PM   Result Value Ref Range    Glucose (POC) 102 65 - 117 mg/dL    Performed by Carmelina Paddock    GLUCOSE, POC    Collection Time: 07/11/19  8:14 PM   Result Value Ref Range    Glucose (POC) 114 65 - 117 mg/dL    Performed by Tora Duck    RENAL FUNCTION PANEL    Collection Time: 07/12/19  7:06 AM   Result Value Ref Range    Sodium 137 136 - 145  mmol/L    Potassium 5.1 3.5 - 5.1 mmol/L    Chloride 106 97 - 108 mmol/L    CO2 27 21 - 32 mmol/L    Anion gap 4 (L) 5 - 15 mmol/L    Glucose 111 (H) 65 - 100 mg/dL    BUN 59 (H) 6 - 20 mg/dL    Creatinine 2.70 (H) 0.70 - 1.30 mg/dL    BUN/Creatinine ratio 26 (H) 12 - 20      GFR est AA 36 (L) >60 ml/min/1.59m2    GFR est non-AA 30 (L) >60 ml/min/1.72m2    Calcium 8.4 (L) 8.5 - 10.1 mg/dL    Phosphorus 3.4 2.6 - 4.7 mg/dL    Albumin 2.4 (L) 3.5 - 5.0 g/dL   GLUCOSE, POC    Collection Time: 07/12/19  7:06 AM   Result Value Ref Range    Glucose (POC) 133 (H) 65 - 117 mg/dL    Performed by Consuello Closs        Results     Procedure Component Value Units Date/Time    CULTURE, URINE [[id number]] Collected: 07/11/19 0330    Order Status: Completed Specimen: Urine Updated: 07/12/19 5176     Special Requests: No Special Requests        Culture result: No Growth (<1000 cfu/mL)       C. DIFFICILE (DNA) [[id number]]  Collected: 07/09/19 0345    Order Status: Completed Specimen: Stool Updated: 07/09/19 0839     C. difficile (DNA) Negative       MRSA SCREEN - PCR (NASAL) [[id number]] Collected: 07/04/19 1543    Order Status: Completed Specimen: Swab Updated: 07/04/19 2203     MRSA by PCR, Nasal Not Detected       COVID-19 RAPID TEST [[id number]] Collected: 07/04/19 0630    Order Status: Completed Specimen: Nasopharyngeal Updated: 07/04/19 0738     Specimen source Nasopharyngeal        COVID-19 rapid test Not Detected        Comment: Rapid Abbott ID Now   Rapid NAAT:  The specimen is NEGATIVE for SARS-CoV-2, the novel coronavirus associated with COVID-19.   Negative results should be treated as presumptive and, if inconsistent with clinical signs and symptoms or necessary for patient management, should be tested with an alternative molecular assay. Negative results do not preclude SARS-CoV-2 infection and should not be used as the sole basis for patient management decisions.   This test has been authorized by the FDA under   an  Emergency Use Authorization (EUA) for use by authorized laboratories. Fact sheet for Healthcare Providers: FlickSafe.gl Fact sheet for Patients: [place]   Methodology: Isothermal Nucleic Acid Amplification         CULTURE, URINE [[id number]] Collected: 07/03/19 1615    Order Status: Completed Specimen: Urine Updated: 07/05/19 0838     Special Requests: --        No Special Requests  Reflexed from [id number]       Culture result: No Growth (<1000 cfu/mL)              Labs:     Recent Labs     07/10/19  0707   WBC 6.3   HGB 8.8*   HCT 28.4*   PLT 285     Recent Labs     07/12/19  0706 07/10/19  0707   NA 137 138   K 5.1 4.9   CL 106 109*   CO2 27 23   BUN 59* 55*   CREA 2.23* 1.97*   GLU 111* 78   CA 8.4* 8.0*   PHOS 3.4  --      Recent Labs     07/12/19  0706 07/10/19  0707   ALT  --  20   AP  --  60   TBILI  --  0.3   TP  --  6.4   ALB 2.4* 2.2*   GLOB  --  4.2*     No results for input(s): INR, PTP, APTT, INREXT, INREXT in the last 72 hours.   No results for input(s): FE, TIBC, PSAT, FERR in the last 72 hours.   No results found for: FOL, RBCF   No results for input(s): PH, PCO2, PO2 in the last 72 hours.  No results for input(s): CPK, CKNDX, TROIQ in the last 72 hours.    No lab exists for component: CPKMB  No results found for: CHOL, CHOLX, CHLST, CHOLV, HDL, HDLP, LDL, LDLC, DLDLP, TGLX, TRIGL, TRIGP, CHHD, CHHDX  Lab Results   Component Value Date/Time    Glucose (POC) 133 (H) 07/12/2019 07:06 AM    Glucose (POC) 114 07/11/2019 08:14 PM    Glucose (POC) 102 07/11/2019 03:30 PM    Glucose (POC) 96 07/11/2019 12:36 PM    Glucose (POC) 83 07/11/2019 08:04 AM     Lab Results  Component Value Date/Time    Color Yellow/Straw 07/08/2019 06:20 PM    Appearance Turbid (A) 07/08/2019 06:20 PM    Specific gravity 1.011 07/08/2019 06:20 PM    pH (UA) 6.0 07/08/2019 06:20 PM    Protein 30 (A) 07/08/2019 06:20 PM    Glucose Negative 07/08/2019 06:20 PM    Ketone  Negative 07/08/2019 06:20 PM    Bilirubin Negative 07/08/2019 06:20 PM    Urobilinogen 0.1 07/08/2019 06:20 PM    Nitrites Negative 07/08/2019 06:20 PM    Leukocyte Esterase Large (A) 07/08/2019 06:20 PM    Bacteria Negative 07/08/2019 06:20 PM    WBC >100 (H) 07/08/2019 06:20 PM    RBC >100 (H) 07/08/2019 06:20 PM         Assessment:     Acute kidney injury  Bilateral hydronephrosis s/p cystoscopy bilateral ureteral stent placement  Hypotension  Urinary retention  Protein calorie malnutrition moderate  Electrolyte disturbances??-hyponatremia, hyperkalemia, hyperphosphatemia, hypermagnesemia  Decreased mobility  History of cardiac disease and pacemaker  History of high blood pressure  History of prostate cancer  Abnormal chest x-ray suggestive of emphysema??-patient does not currently smoke  Cachexia  Hyperkalemia  Anemia hemoglobin 9.1  HIV disease  Reactive RPR/follow with infectious disease    Plan:     Continue medications:   Dapsone 100mg  po qday   Dolutegravir 50mg  po qday   Emtricitaine-tenofovir alafen 1 tab po qday   Epo 10,000 units subq qweek  Ergocalciferol 50,000 units po qweek  Heparin 5000 units subq q8hr  Humalog bid subq  Meropenem 500mg  IV q12h  Midodrine 10mg  po tid  Sodium bicarb 650mg  po bid   Tamsulosin 0.4mg  po qhs     If okay with infectious disease patient can discharge home    CM: pt continues to pend insurance auth to go to [facility]

## 2019-07-12 NOTE — Progress Notes (Addendum)
Patients discharge summary and SBAR given to [place], LPN at [facility], no further questions at this time. Peripheral IV removed with no complications. Foley catheter in place and draining. Transport called and pick-up time was set for 1530. Patient in bed resting, no signs or symptoms of distress noted.    1730- [facility] transport called to get an ETA on pick-up time for patient. New pick-up time is set for 1830.    1845- [facility] on unit to take patient. No signs or symptoms of distress noted.    Discharge plan of care/case management plan validated with provider discharge order.

## 2019-07-12 NOTE — Progress Notes (Signed)
Patient has received insurance auth to go to [facility], room 118A, report can be called to [phone].    CM met with patient to notify of DC this date, patient agreeable.  Medicare pt has received, reviewed, and signed 2nd IM letter informing them of their right to appeal the discharge.  Signed copied has been placed on pt bedside chart.    Discharge checklist completed, nurse notified.

## 2019-07-12 NOTE — Discharge Summary (Signed)
General Daily Progress Note          Patient Name:   Wesley Clark       Date of Birth:   04-26-1955       Age:  64 y.o.      Admit Date: 07/03/2019      Subjective:     Patient presents awake and in no acute distress. He states that he is feeling okay.     Patient is a poor historian and appears confused. He states that he is having some urgency but has been unable to void.  He states that he has been hydrating well.     He denies any chest pain, shortness of breath, nausea vomiting, fatigue, fever, chills, back pain, or abdomen pain.     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)  Viral load 90,000/mL  Urine cx (-), repeat     Nephrology consult:   Cr improved post stents  Input= output  BP still low     Urology consult:   Cr at baseline  D/c planning w/SNF  PSA f/u outpatient    Significant labs:  Hgb 8.8  Eos 13 (7 before)   Procal 0.29 (0.15 before)  CRP 1.49 (1.62 before)   Ca 8.0 (7.6 before)  BUN 55 (50 before)  Cr 1.97 (1.8 before)       Objective:     Visit Vitals  BP (!) 88/56 (BP 1 Location: Right upper arm, BP Patient Position: At rest)   Pulse 89   Temp 97.6 ??F (36.4 ??C)   Resp 20   Ht 5\' 8"  (1.727 m)   Wt 47.9 kg (105 lb 9.6 oz)   SpO2 96%   BMI 16.06 kg/m??        Recent Results (from the past 24 hour(s))   GLUCOSE, POC    Collection Time: 07/11/19  3:30 PM   Result Value Ref Range    Glucose (POC) 102 65 - 117 mg/dL    Performed by Colbert Coyer    GLUCOSE, POC    Collection Time: 07/11/19  8:14 PM   Result Value Ref Range    Glucose (POC) 114 65 - 117 mg/dL    Performed by Daiva Nakayama    RENAL FUNCTION PANEL    Collection Time: 07/12/19  7:06 AM   Result Value Ref Range    Sodium 137 136 - 145 mmol/L    Potassium 5.1 3.5 - 5.1 mmol/L    Chloride 106 97 - 108 mmol/L    CO2 27 21 - 32 mmol/L    Anion gap 4 (L) 5 - 15 mmol/L    Glucose 111 (H) 65 - 100 mg/dL    BUN 59 (H) 6 - 20 mg/dL    Creatinine 2.23 (H) 0.70 - 1.30 mg/dL    BUN/Creatinine ratio 26 (H) 12 - 20      GFR est AA 36 (L)  >60 ml/min/1.102m2    GFR est non-AA 30 (L) >60 ml/min/1.109m2    Calcium 8.4 (L) 8.5 - 10.1 mg/dL    Phosphorus 3.4 2.6 - 4.7 mg/dL    Albumin 2.4 (L) 3.5 - 5.0 g/dL   GLUCOSE, POC    Collection Time: 07/12/19  7:06 AM   Result Value Ref Range    Glucose (POC) 133 (H) 65 - 117 mg/dL    Performed by [place], POC    Collection  Time: 07/12/19 11:37 AM   Result Value Ref Range    Glucose (POC) 265 (H) 65 - 117 mg/dL    Performed by Marry Guan      @LABCOMPINFO @      Review of Systems    Constitutional: Negative for chills and fever.   HENT: Negative.    Eyes: Negative.    Respiratory: Negative.    Cardiovascular: Negative.    Gastrointestinal: Negative for abdominal pain and nausea.   Skin: Negative.    Neurological: Negative.        Physical Exam:      Constitutional: pt is disoriented   HENT:   Head: Normocephalic and atraumatic.   Eyes: Pupils are equal, round, and reactive to light. EOM are normal.   Cardiovascular: Normal rate, regular rhythm and normal heart sounds.   Pulmonary/Chest: Breath sounds normal. No wheezes. No rales.   Exhibits no tenderness.   Abdominal: Soft. Bowel sounds are normal. There is no abdominal tenderness. There is no rebound and no guarding.   Musculoskeletal: Normal range of motion.   Neurological: pt is alert and oriented to person, place, and time.     CT ABD PELV WO CONT   Final Result      Bilateral ureteric stents. Bilateral hydronephrosis.      XR FLUOROSCOPY UNDER 60 MINUTES   Final Result      RETROPERITONEUM COMP   Final Result   1. Bilateral moderate hydronephrosis, similar to previous.   2. Increased left renal echotexture from medical renal disease.      [place] RETROPERITONEUM COMP   Final Result   Bilateral hydronephrosis likely due to reflux from distended   bladder.       XR CHEST SNGL V   Final Result           Recent Results (from the past 24 hour(s))   GLUCOSE, POC    Collection Time: 07/11/19  3:30 PM   Result Value Ref Range    Glucose (POC) 102 65  - 117 mg/dL    Performed by 09/10/19    GLUCOSE, POC    Collection Time: 07/11/19  8:14 PM   Result Value Ref Range    Glucose (POC) 114 65 - 117 mg/dL    Performed by 09/10/19    RENAL FUNCTION PANEL    Collection Time: 07/12/19  7:06 AM   Result Value Ref Range    Sodium 137 136 - 145 mmol/L    Potassium 5.1 3.5 - 5.1 mmol/L    Chloride 106 97 - 108 mmol/L    CO2 27 21 - 32 mmol/L    Anion gap 4 (L) 5 - 15 mmol/L    Glucose 111 (H) 65 - 100 mg/dL    BUN 59 (H) 6 - 20 mg/dL    Creatinine 09/11/19 (H) 0.70 - 1.30 mg/dL    BUN/Creatinine ratio 26 (H) 12 - 20      GFR est AA 36 (L) >60 ml/min/1.70m2    GFR est non-AA 30 (L) >60 ml/min/1.80m2    Calcium 8.4 (L) 8.5 - 10.1 mg/dL    Phosphorus 3.4 2.6 - 4.7 mg/dL    Albumin 2.4 (L) 3.5 - 5.0 g/dL   GLUCOSE, POC    Collection Time: 07/12/19  7:06 AM   Result Value Ref Range    Glucose (POC) 133 (H) 65 - 117 mg/dL    Performed by TEACHEY GLENDA    GLUCOSE, POC    Collection  Time: 07/12/19 11:37 AM   Result Value Ref Range    Glucose (POC) 265 (H) 65 - 117 mg/dL    Performed by Marry Guan        Results     Procedure Component Value Units Date/Time    CULTURE, URINE [[id number]] Collected: 07/11/19 0330    Order Status: Completed Specimen: Urine Updated: 07/12/19 0838     Special Requests: No Special Requests        Culture result: No Growth (<1000 cfu/mL)       C. DIFFICILE (DNA) [[id number]] Collected: 07/09/19 0345    Order Status: Completed Specimen: Stool Updated: 07/09/19 0839     C. difficile (DNA) Negative       MRSA SCREEN - PCR (NASAL) [[id number]] Collected: 07/04/19 1543    Order Status: Completed Specimen: Swab Updated: 07/04/19 2203     MRSA by PCR, Nasal Not Detected       COVID-19 RAPID TEST [[id number]] Collected: 07/04/19 0630    Order Status: Completed Specimen: Nasopharyngeal Updated: 07/04/19 0738     Specimen source Nasopharyngeal        COVID-19 rapid test Not Detected        Comment: Rapid Abbott ID Now   Rapid NAAT:  The specimen is NEGATIVE for  SARS-CoV-2, the novel coronavirus associated with COVID-19.   Negative results should be treated as presumptive and, if inconsistent with clinical signs and symptoms or necessary for patient management, should be tested with an alternative molecular assay. Negative results do not preclude SARS-CoV-2 infection and should not be used as the sole basis for patient management decisions.   This test has been authorized by the FDA under   an Emergency Use Authorization (EUA) for use by authorized laboratories. Fact sheet for Healthcare Providers: FlickSafe.gl Fact sheet for Patients: [place].at   Methodology: Isothermal Nucleic Acid Amplification         CULTURE, URINE [[id number]] Collected: 07/03/19 1615    Order Status: Completed Specimen: Urine Updated: 07/05/19 0838     Special Requests: --        No Special Requests  Reflexed from [id number]       Culture result: No Growth (<1000 cfu/mL)              Labs:     Recent Labs     07/10/19  0707   WBC 6.3   HGB 8.8*   HCT 28.4*   PLT 285     Recent Labs     07/12/19  0706 07/10/19  0707   NA 137 138   K 5.1 4.9   CL 106 109*   CO2 27 23   BUN 59* 55*   CREA 2.23* 1.97*   GLU 111* 78   CA 8.4* 8.0*   PHOS 3.4  --      Recent Labs     07/12/19  0706 07/10/19  0707   ALT  --  20   AP  --  60   TBILI  --  0.3   TP  --  6.4   ALB 2.4* 2.2*   GLOB  --  4.2*     No results for input(s): INR, PTP, APTT, INREXT, INREXT in the last 72 hours.   No results for input(s): FE, TIBC, PSAT, FERR in the last 72 hours.   No results found for: FOL, RBCF   No results for input(s): PH, PCO2, PO2 in the last 72 hours.  No results for input(s): CPK,  CKNDX, TROIQ in the last 72 hours.    No lab exists for component: CPKMB  No results found for: CHOL, CHOLX, CHLST, CHOLV, HDL, HDLP, LDL, LDLC, DLDLP, TGLX, TRIGL, TRIGP, CHHD, CHHDX  Lab Results   Component Value Date/Time    Glucose (POC) 265 (H) 07/12/2019 11:37 AM    Glucose (POC) 133 (H)  07/12/2019 07:06 AM    Glucose (POC) 114 07/11/2019 08:14 PM    Glucose (POC) 102 07/11/2019 03:30 PM    Glucose (POC) 96 07/11/2019 12:36 PM     Lab Results   Component Value Date/Time    Color Yellow/Straw 07/08/2019 06:20 PM    Appearance Turbid (A) 07/08/2019 06:20 PM    Specific gravity 1.011 07/08/2019 06:20 PM    pH (UA) 6.0 07/08/2019 06:20 PM    Protein 30 (A) 07/08/2019 06:20 PM    Glucose Negative 07/08/2019 06:20 PM    Ketone Negative 07/08/2019 06:20 PM    Bilirubin Negative 07/08/2019 06:20 PM    Urobilinogen 0.1 07/08/2019 06:20 PM    Nitrites Negative 07/08/2019 06:20 PM    Leukocyte Esterase Large (A) 07/08/2019 06:20 PM    Bacteria Negative 07/08/2019 06:20 PM    WBC >100 (H) 07/08/2019 06:20 PM    RBC >100 (H) 07/08/2019 06:20 PM         Assessment:     Acute kidney injury  Bilateral hydronephrosis s/p cystoscopy bilateral ureteral stent placement  Hypotension  Urinary retention  Protein calorie malnutrition moderate  Electrolyte disturbances??-hyponatremia, hyperkalemia, hyperphosphatemia, hypermagnesemia  Decreased mobility  History of cardiac disease and pacemaker  History of high blood pressure  History of prostate cancer  Abnormal chest x-ray suggestive of emphysema??-patient does not currently smoke  Cachexia  Hyperkalemia  Anemia hemoglobin 9.1  HIV disease  Reactive RPR/follow with infectious disease    Plan:     Continue medications:   Dapsone 100mg  po qday   Dolutegravir 50mg  po qday   Emtricitaine-tenofovir alafen 1 tab po qday   Epo 10,000 units subq qweek  Ergocalciferol 50,000 units po qweek  Heparin 5000 units subq q8hr  Humalog bid subq  Meropenem 500mg  IV q12h  Sodium bicarb 650mg  po bid   Tamsulosin 0.4mg  po qhs     Repeat the labs in the morning   PT OT consult discharge planning with SNF     Discussed with case manager waiting for authorization to go to nursing home      If okay with infectious disease patient can discharge home

## 2019-07-12 NOTE — Telephone Encounter (Addendum)
Hailey from [facility] called needing to make a 3 week f/u for pt for retain urethral stent with Dr. Willaim Bane. Dr. Willaim Bane performed a procedure on him at the hospital but Im not sure if he has ever been seen here in the office. She said when you find a date and time you can contact the patient to let him know. Megan from [facility] also called wanting to know if they needed to keep the foley in or remove it before they discharge him

## 2019-07-12 NOTE — Progress Notes (Signed)
Physician Progress Note      PATIENTJUDEA, RICHES  CSN #:                  [id number]  DOB:                       15-Jul-1955  ADMIT DATE:       07/03/2019 3:49 PM  DISCH DATE:        07/12/2019 6:41 PM  RESPONDING  PROVIDER #:        Coreena Rubalcava MD Gabriel Earing MD          QUERY TEXT:    Pt admitted with sepsis Pt noted to have HIV infection. If possible, please clarify in progress notes and discharge summary the patient?s HIV status as:    The medical record reflects the following:  Risk Factors: 64 yo male with AKI, malnutrition, pyonephrosis  Clinical Indicators: CD4 151, Viral load 90,000 copies/ml  Treatment: Dolutegravir/Emtriva/Tenofovir (TAF)    Thank you,  Amy Gabbert, RN, CCDS, CPC  Options provided:  -- Asymptomatic HIV  -- HIV positive by serology only  -- HIV disease/AIDs with PMH of HIV related illness or opportunistic infection  -- HIV disease/AIDS with current HIV related illness (sepsis)  -- Other - I will add my own diagnosis  -- Disagree - Not applicable / Not valid  -- Disagree - Clinically unable to determine / Unknown  -- Refer to Clinical Documentation Reviewer    PROVIDER RESPONSE TEXT:    This patient has asymptomatic HIV.    Query created by: Lezlie Lye on 07/25/2019 2:42 PM      Electronically signed by:  Glade Lloyd MD Karely Hurtado MD 08/01/2019 3:12 PM

## 2019-07-12 NOTE — Telephone Encounter (Signed)
Please advise

## 2019-07-12 NOTE — Progress Notes (Signed)
Progress Note    Patient: Wesley Clark MRN: [id number]  SSN: [id number]    Date of Birth: [birth year]  Age: 64 y.o.  Sex: male      Admit Date: 07/03/2019    LOS: 9 days     Subjective:   Patient followed for complicated UTI and HIV-1 infection but urine culture was negative. Repeat urinalysis showing persistent pyuria but again no bacteria.  He remains afebrile with normal WBC and decreasing procal and CRP. RPR reactive with high titer, but he apparently has history of syphilis.  He is currently on IV Meropenem along with HIV medications.  Patient awake and responsive, no complaints. He is scheduled for discharge to [facility].  Objective:     Vitals:    07/12/19 0445 07/12/19 0702 07/12/19 1014 07/12/19 1132   BP: 115/80 90/63  (!) 88/56   Pulse: 62 89  89   Resp: 20 20  20    Temp: 98.2 ??F (36.8 ??C) 97.6 ??F (36.4 ??C)  97.6 ??F (36.4 ??C)   SpO2: 95% 96%  96%   Weight:       Height:   5\' 8"  (1.727 m)         Intake and Output:  Current Shift: No intake/output data recorded.  Last three shifts: 05/10 1901 - 05/12 0700  In: 250 [P.O.:250]  Out: 1500 [Urine:1500]    Physical Exam:   Vitals signs  and nursing note reviewed.   Constitutional:       General: He is not in acute distress.     Appearance: Normal appearance. He is ill-appearing.   HENT: unremarkable   Neck:      Musculoskeletal: Neck supple.   Cardiovascular:      Rate and Rhythm: Regular rhythm.       Heart sounds: Normal heart sounds. No murmur.   Pulmonary:      Breath sounds: No wheezing, rhonchi or rales.   Abdominal:      General: Abdomen is flat. Bowel sounds are normal.      Palpations: Abdomen is soft.      Tenderness: There is no abdominal tenderness.   Genitourinary:     Comments: Foley catheter  Musculoskeletal:      Right lower leg: No edema.      Left lower leg: No edema.   Skin: extensive vitiligo     Findings: No lesion or rash.   Neurological:      General: No focal deficit present.      Mental Status: He is alert. He is  disoriented.   Psychiatric:         Mood and Affect: Mood normal.         Behavior: Behavior normal.         Thought Content: Thought content normal.      Lab/Data Review:     WBC 6,300 with 13% eosinophils    Procal 0.29 < 0.15 < 0.30 <0.34  CRP 1.49 <1.62 <1.22 <1.57 <2.01    Stool C. Diff toxin Negative    HIV-1 antibody Positive  CD4 count 151 (16.8%)  HIV-1 RNA viral load 90,000 copies/ml    Urine culture (5/3) No growth FINAL  Urine culture (5/11) No growth FINAL    Assessment:     Active Problems:    Renal failure (07/03/2019)      Hyperkalemia (07/03/2019)      Electrolyte imbalance (07/03/2019)      AKI (acute kidney injury) (HCC) (07/03/2019)  1. Complicated UTI (bilateral hydronephrosis/distended bladder), with PERSISTENT pyuria, repeat urine culture negative, Day #7 IV Meropenem.  2. 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 Hypotension, etiology unclear, ?sepsis  4. Renal failure  5. Penicillin allergy  6. Positive RPR with titer 1:64    Comment:  Patient has prior history of syphilis but details unavailable and last RPR titer unknown.     Plan:   1. Continue IV Meropenem for complicated UTI for 7 more days  2. Continue Tivicay/Descovy and Dapsone  3. Follow-up HIV Genosure resistance profile for possible virologic failure  4. Patient should follow-up with Dr. Ricarda Frame, [facility] regarding patient's HIV status and RPR  (no return call from Dr. Rayburn Ma)  5. Cleared for discharge from ID standpoint    Signed By: Lorina Rabon, MD     Jul 12, 2019

## 2019-07-12 NOTE — Progress Notes (Signed)
Comprehensive Nutrition Assessment    Type and Reason for Visit: Reassess(Goal)    Nutrition Recommendations/Plan  Continue Cardiac diet  ????????????Monitor need for Soft/Chopped restriction  Continue Ensure Enlive TID  Continue Ensure pdg daily   Allow additional snacks as desired  ??  Update measured wts frequently  Document %meal and supplement intakes, BMs in I/Os    Nutrition Assessment:  Admitted for generalized weakness, SOB x 2-3 days pta. COVID 19 negative. CXR finding empysema/bullous cysts. US renal finding bilat hydronephrosis and distended bladder. S/p bilateral ureteral stent placement on 5/6. Admitted to ICU d/t hypotension, requiring x1 pressor now d/c. Ordered Cardiac diet, intakes 50-75% per EMR. RD visualized 50% lunch intake (pork chop, vegetables, pudding). Pt reports not being ???picky??? at all, agreeable to all ONS. Denied specific preferences. RD spoke with CM on unit d/t pt verbalizing concerns for food insecurity- per CM pt finished application for meals on wheels. On 5/10 pt states he ate 100% of B, L, and has a great appetite. 5/11 ate ~50% dinner per EMR. Today, pt is eating very well- says "I won't ever turn down food." Labs: Glu POC 133, H/H 8.8/ 28.4, BUN 59, Creat 2.23, Mg 1.4. Labs: dapsone, tivicay, Heparin, insulin, merrem, proamatine, sodium bicarbonate, IVF.        Malnutrition Assessment:  Malnutrition Status:  Severe malnutrition    Context:  Chronic illness     Findings of the 6 clinical characteristics of malnutrition:   Energy Intake:  Mild decrease in energy intake (specify)(Now eating 3 meals/day)  Weight Loss:  Unable to assess     Body Fat Loss:  1 - Mild body fat loss, Triceps   Muscle Mass Loss:  7 - Severe muscle mass loss, Clavicles (pectoralis &deltoids), Temples (temporalis)  Fluid Accumulation:  No significant fluid accumulation,        Estimated Daily Nutrient Needs:  Energy (kcal): 1600 kcal/day (32kcal/kg); Weight Used for Energy Requirements: Current  Protein (g): 60  g/day (1.2g/kg); Weight Used for Protein Requirements: Current  Fluid (ml/day): 1600 ml; Method Used for Fluid Requirements: 1 ml/kcal    Nutrition Related Findings:  NFPE +severe muscle and fat wasting. ?Vitiligo. Poor dentition observed, however pt denied c/s difficulty. Denied n/v/c, but has had diarrhea since admission- but it has now stopped. Last BM 5/11. No edema.         Wounds:    Surgical incision(perineum)      Current Nutrition Therapies:  DIET NUTRITIONAL SUPPLEMENTS Lunch; Ensure Pudding (chocolate)  DIET NUTRITIONAL SUPPLEMENTS Breakfast, Lunch, Dinner; [employer] (alternate flavors)  DIET CARDIAC Regular    Anthropometric Measures:  ?? Height:  5\' 8"  (172.7 cm)  ?? Current Body Wt:  50 kg (110 lb 3.7 oz)(RD obtained 5/12)   ?? Admission Body Wt:  105 lb 9.6 oz(5/5)    ?? Usual Body Wt:  49.9 kg (110 lb)(per pt- unsure when last weighed)     ?? Ideal Body Wt:  154 lbs:  71.6 %   ?? BMI Category:  Underweight (BMI less than 18.5)       Nutrition Diagnosis:   ?? Underweight related to inadequate protein-energy intake as evidenced by BMI, severe muscle loss      Nutrition Interventions:   Food and/or Nutrient Delivery: Continue current diet, Continue oral nutrition supplement  Nutrition Education and Counseling: No recommendations at this time  Coordination of Nutrition Care: Continue to monitor while inpatient, Coordination of community care(CM provided resources for Meals on Wheels)  Goals:  Meet >75% est needs via PO x 5-7 days, Wt gain 0.5kg/week, Lytes WNL, Maintain skin integrity       Nutrition Monitoring and Evaluation:   Behavioral-Environmental Outcomes: None identified  Food/Nutrient Intake Outcomes: Food and nutrient intake, Supplement intake  Physical Signs/Symptoms Outcomes: Chewing or swallowing, Biochemical data, Nutrition focused physical findings, Weight    Discharge Planning:    Continue oral nutrition supplement, Assist with food insecurity     Electronically signed by Dewayne Hatch, RD  on 07/12/2019 at 10:01 AM    Contact: [phone]

## 2019-07-12 NOTE — Progress Notes (Signed)
Attempted to see patient today for OT treatment. Patient received sitting upright in bed.  Case management in at start of session to notify patient of his acceptance and area SNF.  Patient states he has already  had a bath, was "ok" with other offered ADL (grooming) activities. No treatment performed.  Patient left sitting upright with no needs, call light within hand.

## 2019-07-13 NOTE — Telephone Encounter (Signed)
Tried calling patient, wouldn't ring just hungup.

## 2019-07-13 NOTE — Telephone Encounter (Signed)
Waiting on info call.

## 2019-07-13 NOTE — Telephone Encounter (Signed)
Spoke to friend listed under demographics tab.  He stated patient back in [facility] for a fall.  He's going to get info and then call back

## 2019-07-13 NOTE — Telephone Encounter (Signed)
He does need a f/u.  3-4 weeks.  We have seen him inpatient and he has a retained ureteral stent.

## 2019-07-15 LAB — MISC. LAB TEST

## 2019-07-22 LAB — GENOSURE

## 2019-08-21 ENCOUNTER — Encounter: Attending: Infectious Disease | Primary: Internal Medicine

## 2019-09-09 ENCOUNTER — Emergency Department: Admit: 2019-09-09 | Payer: MEDICARE | Primary: Internal Medicine

## 2019-09-09 ENCOUNTER — Emergency Department: Admit: 2019-09-10 | Payer: MEDICARE | Primary: Internal Medicine

## 2019-09-09 ENCOUNTER — Inpatient Hospital Stay
Admit: 2019-09-09 | Discharge: 2019-09-10 | Disposition: A | Discharge status: Other Acute Inpatient Hospital | Payer: MEDICARE | Attending: Emergency Medicine

## 2019-09-09 DIAGNOSIS — N1330 Unspecified hydronephrosis: Secondary | ICD-10-CM

## 2019-09-09 LAB — CBC WITH AUTOMATED DIFF
ABS. BASOPHILS: 0 10*3/uL (ref 0.0–0.1)
ABS. EOSINOPHILS: 0.2 10*3/uL (ref 0.0–0.4)
ABS. IMM. GRANS.: 0.1 10*3/uL — ABNORMAL HIGH (ref 0.00–0.04)
ABS. LYMPHOCYTES: 1.1 10*3/uL (ref 0.8–3.5)
ABS. MONOCYTES: 0.8 10*3/uL (ref 0.0–1.0)
ABS. NEUTROPHILS: 7.6 10*3/uL (ref 1.8–8.0)
ABSOLUTE NRBC: 0 10*3/uL (ref 0.00–0.01)
BASOPHILS: 0 % (ref 0–1)
EOSINOPHILS: 2 % (ref 0–7)
HCT: 38.2 % (ref 36.6–50.3)
HGB: 12.6 g/dL (ref 12.1–17.0)
IMMATURE GRANULOCYTES: 1 % — ABNORMAL HIGH (ref 0–0.5)
LYMPHOCYTES: 11 % — ABNORMAL LOW (ref 12–49)
MCH: 31 PG (ref 26.0–34.0)
MCHC: 33 g/dL (ref 30.0–36.5)
MCV: 93.9 FL (ref 80.0–99.0)
MONOCYTES: 8 % (ref 5–13)
MPV: 9.8 FL (ref 8.9–12.9)
NEUTROPHILS: 78 % — ABNORMAL HIGH (ref 32–75)
NRBC: 0 PER 100 WBC
PLATELET: 331 10*3/uL (ref 150–400)
RBC: 4.07 M/uL — ABNORMAL LOW (ref 4.10–5.70)
RDW: 12.2 % (ref 11.5–14.5)
WBC: 9.7 10*3/uL (ref 4.1–11.1)

## 2019-09-09 MED ORDER — OXYCODONE 5 MG TAB
5 mg | ORAL | Status: AC
Start: 2019-09-09 — End: 2019-09-09
  Administered 2019-09-09: via ORAL

## 2019-09-09 MED ORDER — SODIUM CHLORIDE 0.9% BOLUS IV
0.9 % | Freq: Once | INTRAVENOUS | Status: AC
Start: 2019-09-09 — End: 2019-09-09
  Administered 2019-09-09: via INTRAVENOUS

## 2019-09-09 MED FILL — OXYCODONE 5 MG TAB: 5 mg | ORAL | Qty: 1

## 2019-09-09 MED FILL — SODIUM CHLORIDE 0.9 % IV: INTRAVENOUS | Qty: 1000

## 2019-09-10 ENCOUNTER — Inpatient Hospital Stay: Admit: 2019-09-10 | Payer: MEDICARE | Primary: Internal Medicine

## 2019-09-10 ENCOUNTER — Emergency Department: Admit: 2019-09-10 | Payer: MEDICARE | Primary: Internal Medicine

## 2019-09-10 ENCOUNTER — Inpatient Hospital Stay
Admit: 2019-09-10 | Discharge: 2019-09-21 | Disposition: A | Discharge status: Skilled Nursing Facility | Payer: MEDICARE | Source: Other Acute Inpatient Hospital | Attending: Internal Medicine | Admitting: Internal Medicine

## 2019-09-10 LAB — METABOLIC PANEL, COMPREHENSIVE
A-G Ratio: 0.5 — ABNORMAL LOW (ref 1.1–2.2)
A-G Ratio: 0.5 — ABNORMAL LOW (ref 1.1–2.2)
ALT (SGPT): 13 U/L (ref 12–78)
ALT (SGPT): 15 U/L (ref 12–78)
AST (SGOT): 7 U/L — ABNORMAL LOW (ref 15–37)
AST (SGOT): 8 U/L — ABNORMAL LOW (ref 15–37)
Albumin: 2.9 g/dL — ABNORMAL LOW (ref 3.5–5.0)
Albumin: 3.4 g/dL — ABNORMAL LOW (ref 3.5–5.0)
Alk. phosphatase: 82 U/L (ref 45–117)
Alk. phosphatase: 82 U/L (ref 45–117)
Anion gap: 11 mmol/L (ref 5–15)
Anion gap: 7 mmol/L (ref 5–15)
BUN/Creatinine ratio: 17 (ref 12–20)
BUN/Creatinine ratio: 19 (ref 12–20)
BUN: 123 MG/DL — ABNORMAL HIGH (ref 6–20)
BUN: 143 mg/dL — ABNORMAL HIGH (ref 6–20)
Bilirubin, total: 0.4 MG/DL (ref 0.2–1.0)
Bilirubin, total: 0.4 mg/dL (ref 0.2–1.0)
CO2: 22 mmol/L (ref 21–32)
CO2: 27 mmol/L (ref 21–32)
Calcium: 9.3 mg/dL (ref 8.5–10.1)
Calcium: 9.4 MG/DL (ref 8.5–10.1)
Chloride: 90 mmol/L — ABNORMAL LOW (ref 97–108)
Chloride: 96 mmol/L — ABNORMAL LOW (ref 97–108)
Creatinine: 6.64 MG/DL — ABNORMAL HIGH (ref 0.70–1.30)
Creatinine: 8.21 mg/dL — ABNORMAL HIGH (ref 0.70–1.30)
GFR est AA: 10 mL/min/{1.73_m2} — ABNORMAL LOW (ref >60)
GFR est AA: 8 mL/min/{1.73_m2} — ABNORMAL LOW (ref >60)
GFR est non-AA: 7 mL/min/{1.73_m2} — ABNORMAL LOW (ref >60)
GFR est non-AA: 8 mL/min/{1.73_m2} — ABNORMAL LOW (ref >60)
Globulin: 6.3 g/dL — ABNORMAL HIGH (ref 2.0–4.0)
Globulin: 6.7 g/dL — ABNORMAL HIGH (ref 2.0–4.0)
Glucose: 104 mg/dL — ABNORMAL HIGH (ref 65–100)
Glucose: 116 mg/dL — ABNORMAL HIGH (ref 65–100)
Potassium: 5.4 mmol/L — ABNORMAL HIGH (ref 3.5–5.1)
Potassium: 7 mmol/L — CR (ref 3.5–5.1)
Protein, total: 10.1 g/dL — ABNORMAL HIGH (ref 6.4–8.2)
Protein, total: 9.2 g/dL — ABNORMAL HIGH (ref 6.4–8.2)
Sodium: 123 mmol/L — ABNORMAL LOW (ref 136–145)
Sodium: 130 mmol/L — ABNORMAL LOW (ref 136–145)

## 2019-09-10 LAB — CBC WITH AUTOMATED DIFF
ABS. BASOPHILS: 0.1 10*3/uL (ref 0.0–0.1)
ABS. EOSINOPHILS: 0.2 10*3/uL (ref 0.0–0.4)
ABS. IMM. GRANS.: 0 10*3/uL
ABS. LYMPHOCYTES: 0.3 10*3/uL — ABNORMAL LOW (ref 0.8–3.5)
ABS. MONOCYTES: 0.3 10*3/uL (ref 0.0–1.0)
ABS. NEUTROPHILS: 5.8 10*3/uL (ref 1.8–8.0)
ABSOLUTE NRBC: 0 10*3/uL (ref 0.00–0.01)
BAND NEUTROPHILS: 2 % (ref 0–6)
BASOPHILS: 1 % (ref 0–1)
EOSINOPHILS: 3 % (ref 0–7)
HCT: 34.2 % — ABNORMAL LOW (ref 36.6–50.3)
HGB: 11 g/dL — ABNORMAL LOW (ref 12.1–17.0)
IMMATURE GRANULOCYTES: 0 %
LYMPHOCYTES: 4 % — ABNORMAL LOW (ref 12–49)
MCH: 31.1 PG (ref 26.0–34.0)
MCHC: 32.2 g/dL (ref 30.0–36.5)
MCV: 96.6 FL (ref 80.0–99.0)
MONOCYTES: 5 % (ref 5–13)
MPV: 9.8 FL (ref 8.9–12.9)
NEUTROPHILS: 85 % — ABNORMAL HIGH (ref 32–75)
NRBC: 0 PER 100 WBC
PLATELET: 276 10*3/uL (ref 150–400)
RBC: 3.54 M/uL — ABNORMAL LOW (ref 4.10–5.70)
RDW: 12.3 % (ref 11.5–14.5)
WBC: 6.7 10*3/uL (ref 4.1–11.1)

## 2019-09-10 LAB — METABOLIC PANEL, BASIC
Anion gap: 8 mmol/L (ref 5–15)
BUN/Creatinine ratio: 18 (ref 12–20)
BUN: 127 MG/DL — ABNORMAL HIGH (ref 6–20)
CO2: 27 mmol/L (ref 21–32)
Calcium: 8.9 MG/DL (ref 8.5–10.1)
Chloride: 98 mmol/L (ref 97–108)
Creatinine: 7.11 MG/DL — ABNORMAL HIGH (ref 0.70–1.30)
GFR est AA: 9 mL/min/{1.73_m2} — ABNORMAL LOW (ref >60)
GFR est non-AA: 8 mL/min/{1.73_m2} — ABNORMAL LOW (ref >60)
Glucose: 132 mg/dL — ABNORMAL HIGH (ref 65–100)
Potassium: 5.4 mmol/L — ABNORMAL HIGH (ref 3.5–5.1)
Sodium: 133 mmol/L — ABNORMAL LOW (ref 136–145)

## 2019-09-10 LAB — MAGNESIUM
Magnesium: 2.4 mg/dL (ref 1.6–2.4)
Magnesium: 2.8 mg/dL — ABNORMAL HIGH (ref 1.6–2.4)

## 2019-09-10 LAB — TSH 3RD GENERATION
TSH: 2.33 u[IU]/mL (ref 0.36–3.74)
TSH: 5.64 u[IU]/mL — ABNORMAL HIGH (ref 0.36–3.74)

## 2019-09-10 LAB — URINALYSIS W/MICROSCOPIC
Bilirubin: NEGATIVE
Glucose: NEGATIVE mg/dL
Ketone: NEGATIVE mg/dL
Nitrites: NEGATIVE
Protein: 30 mg/dL — AB
Specific gravity: 1.01 (ref 1.003–1.030)
Urobilinogen: 0.2 EU/dL (ref 0.2–1.0)
WBC: >100 /hpf — ABNORMAL HIGH (ref 0–4)
pH (UA): 8.5 — ABNORMAL HIGH (ref 5.0–8.0)

## 2019-09-10 LAB — SAMPLES BEING HELD

## 2019-09-10 LAB — T4, FREE: T4, Free: 1.3 NG/DL (ref 0.8–1.5)

## 2019-09-10 LAB — PHOSPHORUS
Phosphorus: 5.1 mg/dL — ABNORMAL HIGH (ref 2.6–4.7)
Phosphorus: 5.7 MG/DL — ABNORMAL HIGH (ref 2.6–4.7)

## 2019-09-10 LAB — LIPASE: Lipase: 142 U/L (ref 73–393)

## 2019-09-10 LAB — URINE CULTURE HOLD SAMPLE

## 2019-09-10 LAB — TROPONIN I: Troponin-I, Qt.: <0.05 ng/mL (ref <0.05)

## 2019-09-10 LAB — T3, FREE: Free Triiodothyronine (T3): 2 pg/mL — ABNORMAL LOW (ref 2.2–4.0)

## 2019-09-10 MED ORDER — ACETAMINOPHEN 650 MG RECTAL SUPPOSITORY
650 mg | Freq: Four times a day (QID) | RECTAL | Status: DC | PRN
Start: 2019-09-10 — End: 2019-09-21

## 2019-09-10 MED ORDER — CALCIUM ACETATE 667 MG TAB
667 mg | Freq: Three times a day (TID) | ORAL | Status: DC
Start: 2019-09-10 — End: 2019-09-16
  Administered 2019-09-10 – 2019-09-16 (×18): via ORAL

## 2019-09-10 MED ORDER — SODIUM BICARBONATE 8.4 % (1 MEQ/ML) IV SYRG
8.4 % (1 mEq/mL) | INTRAVENOUS | Status: AC
Start: 2019-09-10 — End: 2019-09-09
  Administered 2019-09-10: 04:00:00 via INTRAVENOUS

## 2019-09-10 MED ORDER — SODIUM CHLORIDE 0.9% BOLUS IV
0.9 % | Freq: Once | INTRAVENOUS | Status: AC
Start: 2019-09-10 — End: 2019-09-10
  Administered 2019-09-10: 03:00:00 via INTRAVENOUS

## 2019-09-10 MED ORDER — POLYETHYLENE GLYCOL 3350 17 GRAM (100 %) ORAL POWDER PACKET
17 gram | Freq: Every day | ORAL | Status: DC | PRN
Start: 2019-09-10 — End: 2019-09-21

## 2019-09-10 MED ORDER — CALCIUM GLUCONATE 100 MG/ML (10%) IV SOLN
100 mg/mL (10%) | INTRAVENOUS | Status: AC
Start: 2019-09-10 — End: 2019-09-09
  Administered 2019-09-10: 04:00:00 via INTRAVENOUS

## 2019-09-10 MED ORDER — SODIUM BICARBONATE 650 MG TAB
650 mg | Freq: Two times a day (BID) | ORAL | Status: DC
Start: 2019-09-10 — End: 2019-09-21
  Administered 2019-09-10 – 2019-09-21 (×23): via ORAL

## 2019-09-10 MED ORDER — SODIUM CHLORIDE 0.9% BOLUS IV
0.9 % | Freq: Once | INTRAVENOUS | Status: AC
Start: 2019-09-10 — End: 2019-09-09
  Administered 2019-09-10: 01:00:00 via INTRAVENOUS

## 2019-09-10 MED ORDER — ONDANSETRON 4 MG TAB, RAPID DISSOLVE
4 mg | Freq: Three times a day (TID) | ORAL | Status: DC | PRN
Start: 2019-09-10 — End: 2019-09-21

## 2019-09-10 MED ORDER — CEFTRIAXONE 1 GRAM SOLUTION FOR INJECTION
1 gram | INTRAMUSCULAR | Status: AC
Start: 2019-09-10 — End: 2019-09-13
  Administered 2019-09-11 – 2019-09-13 (×3): via INTRAVENOUS

## 2019-09-10 MED ORDER — EMTRICITABINE 200 MG-TENOFOVIR ALAFENAMIDE FUMARATE 25 MG TABLET
200-25 mg | Freq: Every day | ORAL | Status: DC
Start: 2019-09-10 — End: 2019-09-21
  Administered 2019-09-11 – 2019-09-21 (×11): via ORAL

## 2019-09-10 MED ORDER — ONDANSETRON (PF) 4 MG/2 ML INJECTION
4 mg/2 mL | Freq: Four times a day (QID) | INTRAMUSCULAR | Status: DC | PRN
Start: 2019-09-10 — End: 2019-09-21

## 2019-09-10 MED ORDER — ACETAMINOPHEN 325 MG TABLET
325 mg | Freq: Four times a day (QID) | ORAL | Status: DC | PRN
Start: 2019-09-10 — End: 2019-09-21

## 2019-09-10 MED ORDER — ALBUMIN, HUMAN 5 % IV
5 % | Freq: Once | INTRAVENOUS | Status: AC
Start: 2019-09-10 — End: 2019-09-10
  Administered 2019-09-10: 20:00:00 via INTRAVENOUS

## 2019-09-10 MED ORDER — INSULIN REGULAR HUMAN 100 UNIT/ML INJECTION
100 unit/mL | INTRAMUSCULAR | Status: AC
Start: 2019-09-10 — End: 2019-09-09
  Administered 2019-09-10: 01:00:00 via INTRAVENOUS

## 2019-09-10 MED ORDER — ERGOCALCIFEROL (VITAMIN D2) 50,000 UNIT CAP
1250 mcg (50,000 unit) | ORAL | Status: DC
Start: 2019-09-10 — End: 2019-09-21
  Administered 2019-09-10 – 2019-09-17 (×2): via ORAL

## 2019-09-10 MED ORDER — SODIUM BICARBONATE 8.4 % (1 MEQ/ML) IV SYRG
8.4 % (1 mEq/mL) | Freq: Once | INTRAVENOUS | Status: AC
Start: 2019-09-10 — End: 2019-09-09

## 2019-09-10 MED ORDER — SODIUM BICARBONATE 8.4 % IV
1 mEq/mL (8.4 %) | INTRAVENOUS | Status: DC
Start: 2019-09-10 — End: 2019-09-09
  Administered 2019-09-10: 03:00:00 via INTRAVENOUS

## 2019-09-10 MED ORDER — DAPSONE 100 MG TAB
100 mg | Freq: Every day | ORAL | Status: DC
Start: 2019-09-10 — End: 2019-09-21
  Administered 2019-09-11 – 2019-09-21 (×11): via ORAL

## 2019-09-10 MED ORDER — CEFTRIAXONE 1 GRAM SOLUTION FOR INJECTION
1 gram | INTRAMUSCULAR | Status: AC
Start: 2019-09-10 — End: 2019-09-10
  Administered 2019-09-10: 07:00:00 via INTRAVENOUS

## 2019-09-10 MED ORDER — SODIUM CHLORIDE 0.9 % IJ SYRG
Freq: Three times a day (TID) | INTRAMUSCULAR | Status: DC
Start: 2019-09-10 — End: 2019-09-21
  Administered 2019-09-10 – 2019-09-21 (×33): via INTRAVENOUS

## 2019-09-10 MED ORDER — TAMSULOSIN SR 0.4 MG 24 HR CAP
0.4 mg | Freq: Every evening | ORAL | Status: DC
Start: 2019-09-10 — End: 2019-09-21
  Administered 2019-09-11 – 2019-09-21 (×11): via ORAL

## 2019-09-10 MED ORDER — DOLUTEGRAVIR 50 MG TABLET
50 mg | Freq: Every day | ORAL | Status: DC
Start: 2019-09-10 — End: 2019-09-21
  Administered 2019-09-11 – 2019-09-21 (×11): via ORAL

## 2019-09-10 MED ORDER — DEXTROSE 50% IN WATER (D50W) IV SYRG
Freq: Once | INTRAVENOUS | Status: AC
Start: 2019-09-10 — End: 2019-09-09
  Administered 2019-09-10: 01:00:00 via INTRAVENOUS

## 2019-09-10 MED ORDER — SODIUM POLYSTYRENE SULFONATE 15 GRAM/60 ML ORAL SUSPENSION
15 gram/60 mL | ORAL | Status: AC
Start: 2019-09-10 — End: 2019-09-10
  Administered 2019-09-10: 15:00:00 via ORAL

## 2019-09-10 MED ORDER — SODIUM CHLORIDE 0.9 % IJ SYRG
INTRAMUSCULAR | Status: DC | PRN
Start: 2019-09-10 — End: 2019-09-21

## 2019-09-10 MED ORDER — SODIUM CHLORIDE 0.9 % IV
INTRAVENOUS | Status: DC
Start: 2019-09-10 — End: 2019-09-21
  Administered 2019-09-10 – 2019-09-21 (×25): via INTRAVENOUS

## 2019-09-10 MED FILL — CALCIUM ACETATE 667 MG TAB: 667 mg | ORAL | Qty: 1

## 2019-09-10 MED FILL — DEXTROSE 50% IN WATER (D50W) IV SYRG: INTRAVENOUS | Qty: 50

## 2019-09-10 MED FILL — SPS (WITH SORBITOL) 15 GRAM-20 GRAM/60 ML ORAL SUSPENSION: 15-20 gram/60 mL | ORAL | Qty: 120

## 2019-09-10 MED FILL — MONOJECT PREFILL ADVANCED 0.9 % SODIUM CHLORIDE INJECTION SYRINGE: INTRAMUSCULAR | Qty: 40

## 2019-09-10 MED FILL — SODIUM CHLORIDE 0.9 % IV: INTRAVENOUS | Qty: 1000

## 2019-09-10 MED FILL — SODIUM BICARBONATE 8.4 % (1 MEQ/ML) IV SYRG: 8.4 % (1 mEq/mL) | INTRAVENOUS | Qty: 50

## 2019-09-10 MED FILL — HUMULIN R REGULAR U-100 INSULIN 100 UNIT/ML INJECTION SOLUTION: 100 unit/mL | INTRAMUSCULAR | Qty: 5

## 2019-09-10 MED FILL — SODIUM BICARBONATE 650 MG TAB: 650 mg | ORAL | Qty: 1

## 2019-09-10 MED FILL — DAPSONE 100 MG TAB: 100 mg | ORAL | Qty: 1

## 2019-09-10 MED FILL — CEFTRIAXONE 1 GRAM SOLUTION FOR INJECTION: 1 gram | INTRAMUSCULAR | Qty: 1

## 2019-09-10 MED FILL — CALCIUM GLUCONATE 100 MG/ML (10%) IV SOLN: 100 mg/mL (10%) | INTRAVENOUS | Qty: 10

## 2019-09-10 MED FILL — TIVICAY 50 MG TABLET: 50 mg | ORAL | Qty: 1

## 2019-09-10 MED FILL — VITAMIN D2 1,250 MCG (50,000 UNIT) CAPSULE: 1250 mcg (50,000 unit) | ORAL | Qty: 1

## 2019-09-10 MED FILL — SODIUM BICARBONATE 8.4 % IV: 1 mEq/mL (8.4 %) | INTRAVENOUS | Qty: 50

## 2019-09-10 MED FILL — DESCOVY 200 MG-25 MG TABLET: 200-25 mg | ORAL | Qty: 1

## 2019-09-10 MED FILL — ALBUTEIN 5 % INTRAVENOUS SOLUTION: 5 % | INTRAVENOUS | Qty: 500

## 2019-09-11 LAB — CBC W/O DIFF
ABSOLUTE NRBC: 0 10*3/uL (ref 0.00–0.01)
HCT: 26.2 % — ABNORMAL LOW (ref 36.6–50.3)
HGB: 8.2 g/dL — ABNORMAL LOW (ref 12.1–17.0)
MCH: 31.1 PG (ref 26.0–34.0)
MCHC: 31.3 g/dL (ref 30.0–36.5)
MCV: 99.2 FL — ABNORMAL HIGH (ref 80.0–99.0)
MPV: 9.4 FL (ref 8.9–12.9)
NRBC: 0 PER 100 WBC
PLATELET: 175 10*3/uL (ref 150–400)
RBC: 2.64 M/uL — ABNORMAL LOW (ref 4.10–5.70)
RDW: 12.6 % (ref 11.5–14.5)
WBC: 5.9 10*3/uL (ref 4.1–11.1)

## 2019-09-11 LAB — MAGNESIUM: Magnesium: 1.8 mg/dL (ref 1.6–2.4)

## 2019-09-11 LAB — METABOLIC PANEL, BASIC
Anion gap: 6 mmol/L (ref 5–15)
BUN/Creatinine ratio: 17 (ref 12–20)
BUN: 89 MG/DL — ABNORMAL HIGH (ref 6–20)
CO2: 27 mmol/L (ref 21–32)
Calcium: 7.7 MG/DL — ABNORMAL LOW (ref 8.5–10.1)
Chloride: 102 mmol/L (ref 97–108)
Creatinine: 5.19 MG/DL — ABNORMAL HIGH (ref 0.70–1.30)
GFR est AA: 14 mL/min/{1.73_m2} — ABNORMAL LOW (ref >60)
GFR est non-AA: 11 mL/min/{1.73_m2} — ABNORMAL LOW (ref >60)
Glucose: 99 mg/dL (ref 65–100)
Potassium: 3.6 mmol/L (ref 3.5–5.1)
Sodium: 135 mmol/L — ABNORMAL LOW (ref 136–145)

## 2019-09-11 LAB — SAMPLES BEING HELD

## 2019-09-11 LAB — CULTURE, URINE: Colony Count: >100000

## 2019-09-11 LAB — PHOSPHORUS: Phosphorus: 4.8 MG/DL — ABNORMAL HIGH (ref 2.6–4.7)

## 2019-09-11 LAB — TROPONIN I: Troponin-I, Qt.: <0.05 ng/mL (ref <0.05)

## 2019-09-11 MED ORDER — BACITRACIN 500 UNIT/G TOPICAL PACKET
500 unit/gram | Freq: Every day | CUTANEOUS | Status: DC
Start: 2019-09-11 — End: 2019-09-21
  Administered 2019-09-12 – 2019-09-21 (×9): via TOPICAL

## 2019-09-11 MED ORDER — ALBUMIN, HUMAN 5 % IV
5 % | Freq: Once | INTRAVENOUS | Status: AC
Start: 2019-09-11 — End: 2019-09-11
  Administered 2019-09-11: 14:00:00 via INTRAVENOUS

## 2019-09-11 MED FILL — TAMSULOSIN SR 0.4 MG 24 HR CAP: 0.4 mg | ORAL | Qty: 1

## 2019-09-11 MED FILL — CALCIUM ACETATE 667 MG TAB: 667 mg | ORAL | Qty: 1

## 2019-09-11 MED FILL — ALBUTEIN 5 % INTRAVENOUS SOLUTION: 5 % | INTRAVENOUS | Qty: 500

## 2019-09-11 MED FILL — SODIUM BICARBONATE 650 MG TAB: 650 mg | ORAL | Qty: 1

## 2019-09-11 MED FILL — DESCOVY 200 MG-25 MG TABLET: 200-25 mg | ORAL | Qty: 1

## 2019-09-11 MED FILL — CEFTRIAXONE 1 GRAM SOLUTION FOR INJECTION: 1 gram | INTRAMUSCULAR | Qty: 1

## 2019-09-11 MED FILL — TIVICAY 50 MG TABLET: 50 mg | ORAL | Qty: 1

## 2019-09-11 MED FILL — DAPSONE 100 MG TAB: 100 mg | ORAL | Qty: 1

## 2019-09-12 ENCOUNTER — Inpatient Hospital Stay: Payer: MEDICARE | Primary: Internal Medicine

## 2019-09-12 ENCOUNTER — Inpatient Hospital Stay: Admit: 2019-09-12 | Payer: MEDICARE | Primary: Internal Medicine

## 2019-09-12 LAB — METABOLIC PANEL, BASIC
Anion gap: 8 mmol/L (ref 5–15)
BUN/Creatinine ratio: 18 (ref 12–20)
BUN: 76 MG/DL — ABNORMAL HIGH (ref 6–20)
CO2: 23 mmol/L (ref 21–32)
Calcium: 8 MG/DL — ABNORMAL LOW (ref 8.5–10.1)
Chloride: 107 mmol/L (ref 97–108)
Creatinine: 4.22 MG/DL — ABNORMAL HIGH (ref 0.70–1.30)
GFR est AA: 17 mL/min/{1.73_m2} — ABNORMAL LOW (ref >60)
GFR est non-AA: 14 mL/min/{1.73_m2} — ABNORMAL LOW (ref >60)
Glucose: 95 mg/dL (ref 65–100)
Potassium: 3.6 mmol/L (ref 3.5–5.1)
Sodium: 138 mmol/L (ref 136–145)

## 2019-09-12 LAB — ECHO ADULT COMPLETE
AV Area by Peak Velocity: 2.16 cm2
AV Peak Gradient: 4.74 mmHg
AV Peak Velocity: 108.87 cm/s
AVA/BSA Peak Velocity: 1.4 cm2/m2
Aortic Root: 2.89 cm
E/E' Lateral: 10.55
E/E' Ratio (Averaged): 9.19
E/E' Septal: 7.84
Est. RA Pressure: 10 mmHg
IVSd: 1 cm (ref 0.60–1.00)
LA Area 4C: 29.6 cm2
LA Major Axis: 4.08 cm
LA Minor Axis: 2.7 cm
LA Volume 2C: 102.01 mL — AB (ref 18.00–58.00)
LA Volume 4C: 94.42 mL — AB (ref 18.00–58.00)
LA Volume BP: 115.85 mL (ref 18.0–58.0)
LA Volume Index 2C: 67.56 ml/m2 (ref 16–28)
LA Volume Index 4C: 62.53 ml/m2 (ref 16–28)
LA Volume Index BP: 76.72 ml/m2 (ref 16–28)
LV E' Lateral Velocity: 7.88 cm/s
LV E' Septal Velocity: 10.6 cm/s
LV Mass 2D Index: 71.6 g/m2 (ref 49–115)
LV Mass 2D: 108.1 g (ref 88–224)
LVIDd: 3.47 cm — AB (ref 4.20–5.90)
LVIDs: 3.06 cm
LVOT Diameter: 2.08 cm
LVOT Peak Gradient: 1.93 mmHg
LVOT Peak Velocity: 69.45 cm/s
LVPWd: 1.08 cm — AB (ref 0.60–1.00)
MV A Velocity: 21.38 cm/s
MV Area by PHT: 4.05 cm2
MV E Velocity: 83.12 cm/s
MV E Wave Deceleration Time: 187.14 ms
MV E/A: 3.89
MV PHT: 54.27 ms
PV Max Velocity: 108.94 cm/s
PV Systolic Peak Instantaneous Gradient: 4.75 mmHg
RVIDd: 4.3 cm
RVSP: 58.51 mmHg
TAPSE: 1.81 cm (ref 1.50–2.00)
TR Max Velocity: 348.26 cm/s
TR Peak Gradient: 48.51 mmHg

## 2019-09-12 LAB — IRON PROFILE
Iron % saturation: 27 % (ref 20–50)
Iron: 34 ug/dL — ABNORMAL LOW (ref 35–150)
TIBC: 126 ug/dL — ABNORMAL LOW (ref 250–450)

## 2019-09-12 LAB — CBC W/O DIFF
ABSOLUTE NRBC: 0 10*3/uL (ref 0.00–0.01)
HCT: 26.1 % — ABNORMAL LOW (ref 36.6–50.3)
HGB: 8 g/dL — ABNORMAL LOW (ref 12.1–17.0)
MCH: 31.1 PG (ref 26.0–34.0)
MCHC: 30.7 g/dL (ref 30.0–36.5)
MCV: 101.6 FL — ABNORMAL HIGH (ref 80.0–99.0)
MPV: 10.4 FL (ref 8.9–12.9)
NRBC: 0 PER 100 WBC
PLATELET: 186 10*3/uL (ref 150–400)
RBC: 2.57 M/uL — ABNORMAL LOW (ref 4.10–5.70)
RDW: 12.5 % (ref 11.5–14.5)
WBC: 5.1 10*3/uL (ref 4.1–11.1)

## 2019-09-12 LAB — EKG, 12 LEAD, INITIAL
Atrial Rate: 112 {beats}/min
Calculated P Axis: 92 degrees
Calculated R Axis: 35 degrees
Calculated T Axis: 161 degrees
P-R Interval: 160 ms
Q-T Interval: 314 ms
QRS Duration: 80 ms
QTC Calculation (Bezet): 428 ms
Ventricular Rate: 112 {beats}/min

## 2019-09-12 LAB — SAMPLES BEING HELD

## 2019-09-12 LAB — MAGNESIUM: Magnesium: 1.7 mg/dL (ref 1.6–2.4)

## 2019-09-12 LAB — GLUCOSE, CSF
Glucose,CSF: 56 MG/DL (ref 40–70)
Tube No.: 1

## 2019-09-12 LAB — CELL COUNT, CSF
CSF RBCs: 12 /mm3 — ABNORMAL HIGH
CSF TUBE NO.: 3
CSF WBCs: 2 /mm3 (ref 0–5)

## 2019-09-12 LAB — PROTEIN, CSF
Protein,CSF: 73 MG/DL — ABNORMAL HIGH (ref 15–45)
Tube No.: 1

## 2019-09-12 LAB — TROPONIN I: Troponin-I, Qt.: <0.05 ng/mL (ref <0.05)

## 2019-09-12 MED ORDER — LIDOCAINE (PF) 10 MG/ML (1 %) IJ SOLN
10 mg/mL (1 %) | INTRAMUSCULAR | Status: DC
Start: 2019-09-12 — End: 2019-09-12
  Administered 2019-09-12: 13:00:00

## 2019-09-12 MED ORDER — SODIUM BICARBONATE 4.2 % INJ
4.2 % | Freq: Once | INTRAVENOUS | Status: AC
Start: 2019-09-12 — End: 2019-09-12
  Administered 2019-09-12: 14:00:00 via SUBCUTANEOUS

## 2019-09-12 MED FILL — TIVICAY 50 MG TABLET: 50 mg | ORAL | Qty: 1

## 2019-09-12 MED FILL — DAPSONE 100 MG TAB: 100 mg | ORAL | Qty: 1

## 2019-09-12 MED FILL — DESCOVY 200 MG-25 MG TABLET: 200-25 mg | ORAL | Qty: 1

## 2019-09-12 MED FILL — LIDOCAINE (PF) 10 MG/ML (1 %) IJ SOLN: 10 mg/mL (1 %) | INTRAMUSCULAR | Qty: 5

## 2019-09-12 MED FILL — CALCIUM ACETATE 667 MG TAB: 667 mg | ORAL | Qty: 1

## 2019-09-12 MED FILL — BACITRACIN 500 UNIT/G TOPICAL PACKET: 500 unit/gram | CUTANEOUS | Qty: 1

## 2019-09-12 MED FILL — TAMSULOSIN SR 0.4 MG 24 HR CAP: 0.4 mg | ORAL | Qty: 1

## 2019-09-12 MED FILL — SODIUM BICARBONATE 4.2 % INJ: 4.2 % | INTRAVENOUS | Qty: 1

## 2019-09-12 MED FILL — SODIUM BICARBONATE 650 MG TAB: 650 mg | ORAL | Qty: 1

## 2019-09-12 MED FILL — CEFTRIAXONE 1 GRAM SOLUTION FOR INJECTION: 1 gram | INTRAMUSCULAR | Qty: 1

## 2019-09-13 DIAGNOSIS — N179 Acute kidney failure, unspecified: Secondary | ICD-10-CM

## 2019-09-13 LAB — EKG, 12 LEAD, INITIAL
Atrial Rate: 122 {beats}/min
Calculated R Axis: 23 degrees
Calculated T Axis: -43 degrees
Diagnosis: UNDETERMINED
Q-T Interval: 398 ms
QRS Duration: 68 ms
QTC Calculation (Bezet): 489 ms
Ventricular Rate: 91 {beats}/min

## 2019-09-13 LAB — METABOLIC PANEL, COMPREHENSIVE
A-G Ratio: 0.6 — ABNORMAL LOW (ref 1.1–2.2)
ALT (SGPT): 7 U/L — ABNORMAL LOW (ref 12–78)
AST (SGOT): 7 U/L — ABNORMAL LOW (ref 15–37)
Albumin: 2.2 g/dL — ABNORMAL LOW (ref 3.5–5.0)
Alk. phosphatase: 49 U/L (ref 45–117)
Anion gap: 7 mmol/L (ref 5–15)
BUN/Creatinine ratio: 18 (ref 12–20)
BUN: 64 MG/DL — ABNORMAL HIGH (ref 6–20)
Bilirubin, total: 0.2 MG/DL (ref 0.2–1.0)
CO2: 21 mmol/L (ref 21–32)
Calcium: 7.8 MG/DL — ABNORMAL LOW (ref 8.5–10.1)
Chloride: 108 mmol/L (ref 97–108)
Creatinine: 3.63 MG/DL — ABNORMAL HIGH (ref 0.70–1.30)
GFR est AA: 21 mL/min/{1.73_m2} — ABNORMAL LOW (ref >60)
GFR est non-AA: 17 mL/min/{1.73_m2} — ABNORMAL LOW (ref >60)
Globulin: 4 g/dL (ref 2.0–4.0)
Glucose: 93 mg/dL (ref 65–100)
Potassium: 3.9 mmol/L (ref 3.5–5.1)
Protein, total: 6.2 g/dL — ABNORMAL LOW (ref 6.4–8.2)
Sodium: 136 mmol/L (ref 136–145)

## 2019-09-13 LAB — MAGNESIUM: Magnesium: 1.4 mg/dL — ABNORMAL LOW (ref 1.6–2.4)

## 2019-09-13 LAB — HIV-1 RNA QT BY PCR
HIV-1 RNA by PCR: 38600 copies/mL
HIV-1 RNA log10 Copies/mL: 4.587 log10copy/mL

## 2019-09-13 LAB — CBC WITH AUTOMATED DIFF
ABS. BASOPHILS: 0.1 10*3/uL (ref 0.0–0.1)
ABS. EOSINOPHILS: 0.5 10*3/uL — ABNORMAL HIGH (ref 0.0–0.4)
ABS. IMM. GRANS.: 0.1 10*3/uL — ABNORMAL HIGH (ref 0.00–0.04)
ABS. LYMPHOCYTES: 1 10*3/uL (ref 0.8–3.5)
ABS. MONOCYTES: 0.5 10*3/uL (ref 0.0–1.0)
ABS. NEUTROPHILS: 4 10*3/uL (ref 1.8–8.0)
ABSOLUTE NRBC: 0 10*3/uL (ref 0.00–0.01)
BASOPHILS: 1 % (ref 0–1)
EOSINOPHILS: 9 % — ABNORMAL HIGH (ref 0–7)
HCT: 25.9 % — ABNORMAL LOW (ref 36.6–50.3)
HGB: 8.1 g/dL — ABNORMAL LOW (ref 12.1–17.0)
IMMATURE GRANULOCYTES: 1 % — ABNORMAL HIGH (ref 0.0–0.5)
LYMPHOCYTES: 17 % (ref 12–49)
MCH: 31.5 PG (ref 26.0–34.0)
MCHC: 31.3 g/dL (ref 30.0–36.5)
MCV: 100.8 FL — ABNORMAL HIGH (ref 80.0–99.0)
MONOCYTES: 9 % (ref 5–13)
MPV: 9.7 FL (ref 8.9–12.9)
NEUTROPHILS: 63 % (ref 32–75)
NRBC: 0 PER 100 WBC
PLATELET: 178 10*3/uL (ref 150–400)
RBC: 2.57 M/uL — ABNORMAL LOW (ref 4.10–5.70)
RDW: 12.8 % (ref 11.5–14.5)
WBC: 6.2 10*3/uL (ref 4.1–11.1)

## 2019-09-13 LAB — LYMPHOCYTES, CD4 PERCENT AND ABSOLUTE
% CD 4 Pos Lymph: 16.9 % — ABNORMAL LOW (ref 30.8–58.5)
ABS. BASOPHILS: 0 10*3/uL (ref 0.0–0.2)
ABS. EOSINOPHILS: 0.4 10*3/uL (ref 0.0–0.4)
ABS. IMM. GRANS.: 0.1 10*3/uL (ref 0.0–0.1)
ABS. MONOCYTES: 0.3 10*3/uL (ref 0.1–0.9)
ABS. NEUTROPHILS: 3.4 10*3/uL (ref 1.4–7.0)
Abs CD4 Helper: 101 /uL — ABNORMAL LOW (ref 359–1519)
Abs Lymphocytes: 0.6 10*3/uL — ABNORMAL LOW (ref 0.7–3.1)
BASOPHILS: 1 %
EOSINOPHILS: 8 %
HCT: 24.5 % — ABNORMAL LOW (ref 37.5–51.0)
HGB: 8.1 g/dL — ABNORMAL LOW (ref 13.0–17.7)
IMMATURE GRANULOCYTES: 1 %
Lymphocytes: 13 %
MCH: 31.6 pg (ref 26.6–33.0)
MCHC: 33.1 g/dL (ref 31.5–35.7)
MCV: 96 fL (ref 79–97)
MONOCYTES: 6 %
NEUTROPHILS: 71 %
PLATELET: 193 10*3/uL (ref 150–450)
RBC: 2.56 x10E6/uL — ABNORMAL LOW (ref 4.14–5.80)
RDW: 12.2 % (ref 11.6–15.4)
WBC: 4.8 10*3/uL (ref 3.4–10.8)

## 2019-09-13 LAB — VDRL, CSF: VDRL, CSF: 1:2 {titer} — ABNORMAL HIGH

## 2019-09-13 MED ORDER — MAGNESIUM SULFATE 4 GRAM/100 ML IV PIGGY BACK
4 gram/100 mL ( %) | Freq: Once | INTRAVENOUS | Status: AC
Start: 2019-09-13 — End: 2019-09-13
  Administered 2019-09-13: 17:00:00 via INTRAVENOUS

## 2019-09-13 MED ORDER — EPOETIN ALFA-EPBX 10,000 UNIT/ML INJECTION SOLUTION
10000 unit/mL | INTRAMUSCULAR | Status: DC
Start: 2019-09-13 — End: 2019-09-21
  Administered 2019-09-13 – 2019-09-20 (×2): via SUBCUTANEOUS

## 2019-09-13 MED FILL — BACITRACIN 500 UNIT/G TOPICAL PACKET: 500 unit/gram | CUTANEOUS | Qty: 1

## 2019-09-13 MED FILL — SODIUM BICARBONATE 650 MG TAB: 650 mg | ORAL | Qty: 1

## 2019-09-13 MED FILL — DESCOVY 200 MG-25 MG TABLET: 200-25 mg | ORAL | Qty: 1

## 2019-09-13 MED FILL — TAMSULOSIN SR 0.4 MG 24 HR CAP: 0.4 mg | ORAL | Qty: 1

## 2019-09-13 MED FILL — TIVICAY 50 MG TABLET: 50 mg | ORAL | Qty: 1

## 2019-09-13 MED FILL — MAGNESIUM SULFATE 4 GRAM/100 ML IV PIGGY BACK: 4 gram/100 mL ( %) | INTRAVENOUS | Qty: 100

## 2019-09-13 MED FILL — CALCIUM ACETATE 667 MG TAB: 667 mg | ORAL | Qty: 1

## 2019-09-13 MED FILL — RETACRIT 10,000 UNIT/ML INJECTION SOLUTION: 10000 unit/mL | INTRAMUSCULAR | Qty: 1

## 2019-09-13 MED FILL — DAPSONE 100 MG TAB: 100 mg | ORAL | Qty: 1

## 2019-09-13 MED FILL — CEFTRIAXONE 1 GRAM SOLUTION FOR INJECTION: 1 gram | INTRAMUSCULAR | Qty: 1

## 2019-09-14 LAB — RENAL FUNCTION PANEL
Albumin: 1.9 g/dL — ABNORMAL LOW (ref 3.5–5.0)
Anion gap: 6 mmol/L (ref 5–15)
BUN/Creatinine ratio: 18 (ref 12–20)
BUN: 58 MG/DL — ABNORMAL HIGH (ref 6–20)
CO2: 21 mmol/L (ref 21–32)
Calcium: 7.6 MG/DL — ABNORMAL LOW (ref 8.5–10.1)
Chloride: 112 mmol/L — ABNORMAL HIGH (ref 97–108)
Creatinine: 3.24 MG/DL — ABNORMAL HIGH (ref 0.70–1.30)
GFR est AA: 23 mL/min/{1.73_m2} — ABNORMAL LOW (ref >60)
GFR est non-AA: 19 mL/min/{1.73_m2} — ABNORMAL LOW (ref >60)
Glucose: 96 mg/dL (ref 65–100)
Phosphorus: 2.1 MG/DL — ABNORMAL LOW (ref 2.6–4.7)
Potassium: 4.2 mmol/L (ref 3.5–5.1)
Sodium: 139 mmol/L (ref 136–145)

## 2019-09-14 LAB — CBC WITH AUTOMATED DIFF
ABS. BASOPHILS: 0 10*3/uL (ref 0.0–0.1)
ABS. EOSINOPHILS: 0.5 10*3/uL — ABNORMAL HIGH (ref 0.0–0.4)
ABS. IMM. GRANS.: 0 10*3/uL (ref 0.00–0.04)
ABS. LYMPHOCYTES: 0.7 10*3/uL — ABNORMAL LOW (ref 0.8–3.5)
ABS. MONOCYTES: 0.4 10*3/uL (ref 0.0–1.0)
ABS. NEUTROPHILS: 2.9 10*3/uL (ref 1.8–8.0)
ABSOLUTE NRBC: 0 10*3/uL (ref 0.00–0.01)
BASOPHILS: 1 % (ref 0–1)
EOSINOPHILS: 11 % — ABNORMAL HIGH (ref 0–7)
HCT: 24.3 % — ABNORMAL LOW (ref 36.6–50.3)
HGB: 7.6 g/dL — ABNORMAL LOW (ref 12.1–17.0)
IMMATURE GRANULOCYTES: 1 % — ABNORMAL HIGH (ref 0.0–0.5)
LYMPHOCYTES: 16 % (ref 12–49)
MCH: 31.1 PG (ref 26.0–34.0)
MCHC: 31.3 g/dL (ref 30.0–36.5)
MCV: 99.6 FL — ABNORMAL HIGH (ref 80.0–99.0)
MONOCYTES: 9 % (ref 5–13)
MPV: 9.6 FL (ref 8.9–12.9)
NEUTROPHILS: 62 % (ref 32–75)
NRBC: 0 PER 100 WBC
PLATELET: 194 10*3/uL (ref 150–400)
RBC: 2.44 M/uL — ABNORMAL LOW (ref 4.10–5.70)
RDW: 12.9 % (ref 11.5–14.5)
WBC: 4.5 10*3/uL (ref 4.1–11.1)

## 2019-09-14 LAB — MAGNESIUM: Magnesium: 2.5 mg/dL — ABNORMAL HIGH (ref 1.6–2.4)

## 2019-09-14 MED ORDER — THIAMINE HCL 100 MG TAB
100 mg | Freq: Every day | ORAL | Status: DC
Start: 2019-09-14 — End: 2019-09-21
  Administered 2019-09-15 – 2019-09-21 (×7): via ORAL

## 2019-09-14 MED FILL — DAPSONE 100 MG TAB: 100 mg | ORAL | Qty: 1

## 2019-09-14 MED FILL — TIVICAY 50 MG TABLET: 50 mg | ORAL | Qty: 1

## 2019-09-14 MED FILL — DESCOVY 200 MG-25 MG TABLET: 200-25 mg | ORAL | Qty: 1

## 2019-09-14 MED FILL — CALCIUM ACETATE 667 MG TAB: 667 mg | ORAL | Qty: 1

## 2019-09-14 MED FILL — BACITRACIN 500 UNIT/G TOPICAL PACKET: 500 unit/gram | CUTANEOUS | Qty: 1

## 2019-09-14 MED FILL — SODIUM BICARBONATE 650 MG TAB: 650 mg | ORAL | Qty: 1

## 2019-09-14 MED FILL — TAMSULOSIN SR 0.4 MG 24 HR CAP: 0.4 mg | ORAL | Qty: 1

## 2019-09-15 LAB — CBC WITH AUTOMATED DIFF
ABS. BASOPHILS: 0 10*3/uL (ref 0.0–0.1)
ABS. EOSINOPHILS: 0.5 10*3/uL — ABNORMAL HIGH (ref 0.0–0.4)
ABS. IMM. GRANS.: 0 10*3/uL (ref 0.00–0.04)
ABS. LYMPHOCYTES: 0.9 10*3/uL (ref 0.8–3.5)
ABS. MONOCYTES: 0.3 10*3/uL (ref 0.0–1.0)
ABS. NEUTROPHILS: 2.9 10*3/uL (ref 1.8–8.0)
ABSOLUTE NRBC: 0 10*3/uL (ref 0.00–0.01)
BASOPHILS: 1 % (ref 0–1)
EOSINOPHILS: 10 % — ABNORMAL HIGH (ref 0–7)
HCT: 23.4 % — ABNORMAL LOW (ref 36.6–50.3)
HGB: 7.2 g/dL — ABNORMAL LOW (ref 12.1–17.0)
IMMATURE GRANULOCYTES: 1 % — ABNORMAL HIGH (ref 0.0–0.5)
LYMPHOCYTES: 19 % (ref 12–49)
MCH: 31.4 PG (ref 26.0–34.0)
MCHC: 30.8 g/dL (ref 30.0–36.5)
MCV: 102.2 FL — ABNORMAL HIGH (ref 80.0–99.0)
MONOCYTES: 7 % (ref 5–13)
MPV: 9.7 FL (ref 8.9–12.9)
NEUTROPHILS: 62 % (ref 32–75)
NRBC: 0 PER 100 WBC
PLATELET: 175 10*3/uL (ref 150–400)
RBC: 2.29 M/uL — ABNORMAL LOW (ref 4.10–5.70)
RDW: 13.3 % (ref 11.5–14.5)
WBC: 4.6 10*3/uL (ref 4.1–11.1)

## 2019-09-15 LAB — METABOLIC PANEL, BASIC
Anion gap: 5 mmol/L (ref 5–15)
BUN/Creatinine ratio: 19 (ref 12–20)
BUN: 52 MG/DL — ABNORMAL HIGH (ref 6–20)
CO2: 21 mmol/L (ref 21–32)
Calcium: 7.2 MG/DL — ABNORMAL LOW (ref 8.5–10.1)
Chloride: 114 mmol/L — ABNORMAL HIGH (ref 97–108)
Creatinine: 2.76 MG/DL — ABNORMAL HIGH (ref 0.70–1.30)
GFR est AA: 28 mL/min/{1.73_m2} — ABNORMAL LOW (ref >60)
GFR est non-AA: 23 mL/min/{1.73_m2} — ABNORMAL LOW (ref >60)
Glucose: 95 mg/dL (ref 65–100)
Potassium: 4.4 mmol/L (ref 3.5–5.1)
Sodium: 140 mmol/L (ref 136–145)

## 2019-09-15 LAB — CULTURE, BLOOD, PAIRED: Culture result:: NO GROWTH

## 2019-09-15 MED ORDER — PENICILLIN G POTASSIUM 5,000,000 UNIT IJ SOLR
5 million unit | Freq: Three times a day (TID) | INTRAMUSCULAR | Status: DC
Start: 2019-09-15 — End: 2019-09-21
  Administered 2019-09-18 – 2019-09-21 (×9): via INTRAVENOUS

## 2019-09-15 MED ORDER — DILTIAZEM HCL 5 MG/ML IV SOLN
5 mg/mL | INTRAVENOUS | Status: DC
Start: 2019-09-15 — End: 2019-09-15

## 2019-09-15 MED ORDER — METOPROLOL TARTRATE 25 MG TAB
25 mg | Freq: Two times a day (BID) | ORAL | Status: DC
Start: 2019-09-15 — End: 2019-09-21
  Administered 2019-09-16 – 2019-09-21 (×11): via ORAL

## 2019-09-15 MED ORDER — METOPROLOL TARTRATE 5 MG/5 ML IV SOLN
5 mg/ mL | Freq: Once | INTRAVENOUS | Status: AC
Start: 2019-09-15 — End: 2019-09-15
  Administered 2019-09-15: 23:00:00 via INTRAVENOUS

## 2019-09-15 MED ORDER — DILTIAZEM HCL 5 MG/ML IV SOLN
5 mg/mL | Freq: Every day | INTRAVENOUS | Status: DC | PRN
Start: 2019-09-15 — End: 2019-09-21

## 2019-09-15 MED FILL — CALCIUM ACETATE 667 MG TAB: 667 mg | ORAL | Qty: 1

## 2019-09-15 MED FILL — SODIUM BICARBONATE 650 MG TAB: 650 mg | ORAL | Qty: 1

## 2019-09-15 MED FILL — DESCOVY 200 MG-25 MG TABLET: 200-25 mg | ORAL | Qty: 1

## 2019-09-15 MED FILL — METOPROLOL TARTRATE 5 MG/5 ML IV SOLN: 5 mg/ mL | INTRAVENOUS | Qty: 5

## 2019-09-15 MED FILL — TAMSULOSIN SR 0.4 MG 24 HR CAP: 0.4 mg | ORAL | Qty: 1

## 2019-09-15 MED FILL — PFIZERPEN-G 5 MILLION UNIT SOLUTION FOR INJECTION: 5 million unit | INTRAMUSCULAR | Qty: 4

## 2019-09-15 MED FILL — TIVICAY 50 MG TABLET: 50 mg | ORAL | Qty: 1

## 2019-09-15 MED FILL — THIAMINE HCL 100 MG TAB: 100 mg | ORAL | Qty: 1

## 2019-09-15 MED FILL — DILTIAZEM HCL 5 MG/ML IV SOLN: 5 mg/mL | INTRAVENOUS | Qty: 25

## 2019-09-15 MED FILL — BACITRACIN 500 UNIT/G TOPICAL PACKET: 500 unit/gram | CUTANEOUS | Qty: 1

## 2019-09-15 MED FILL — DAPSONE 100 MG TAB: 100 mg | ORAL | Qty: 1

## 2019-09-16 LAB — CBC WITH AUTOMATED DIFF
ABS. BASOPHILS: 0 10*3/uL (ref 0.0–0.1)
ABS. BASOPHILS: 0 10*3/uL (ref 0.0–0.1)
ABS. EOSINOPHILS: 0.3 10*3/uL (ref 0.0–0.4)
ABS. EOSINOPHILS: 0.3 10*3/uL (ref 0.0–0.4)
ABS. IMM. GRANS.: 0 10*3/uL (ref 0.00–0.04)
ABS. IMM. GRANS.: 0 10*3/uL (ref 0.00–0.04)
ABS. LYMPHOCYTES: 0.7 10*3/uL — ABNORMAL LOW (ref 0.8–3.5)
ABS. LYMPHOCYTES: 0.9 10*3/uL (ref 0.8–3.5)
ABS. MONOCYTES: 0.3 10*3/uL (ref 0.0–1.0)
ABS. MONOCYTES: 0.4 10*3/uL (ref 0.0–1.0)
ABS. NEUTROPHILS: 2.1 10*3/uL (ref 1.8–8.0)
ABS. NEUTROPHILS: 2.7 10*3/uL (ref 1.8–8.0)
ABSOLUTE NRBC: 0 10*3/uL (ref 0.00–0.01)
ABSOLUTE NRBC: 0 10*3/uL (ref 0.00–0.01)
BASOPHILS: 0 % (ref 0–1)
BASOPHILS: 1 % (ref 0–1)
EOSINOPHILS: 9 % — ABNORMAL HIGH (ref 0–7)
EOSINOPHILS: 8 % — ABNORMAL HIGH (ref 0–7)
HCT: 17.8 % — CL (ref 36.6–50.3)
HCT: 22.2 % — ABNORMAL LOW (ref 36.6–50.3)
HGB: 5.5 g/dL — CL (ref 12.1–17.0)
HGB: 7 g/dL — ABNORMAL LOW (ref 12.1–17.0)
IMMATURE GRANULOCYTES: 1 % — ABNORMAL HIGH (ref 0.0–0.5)
IMMATURE GRANULOCYTES: 1 % — ABNORMAL HIGH (ref 0.0–0.5)
LYMPHOCYTES: 22 % (ref 12–49)
LYMPHOCYTES: 20 % (ref 12–49)
MCH: 31.8 PG (ref 26.0–34.0)
MCH: 32.1 PG (ref 26.0–34.0)
MCHC: 30.9 g/dL (ref 30.0–36.5)
MCHC: 31.5 g/dL (ref 30.0–36.5)
MCV: 102.9 FL — ABNORMAL HIGH (ref 80.0–99.0)
MCV: 101.8 FL — ABNORMAL HIGH (ref 80.0–99.0)
MONOCYTES: 9 % (ref 5–13)
MONOCYTES: 9 % (ref 5–13)
MPV: 9.3 FL (ref 8.9–12.9)
MPV: 9.2 FL (ref 8.9–12.9)
NEUTROPHILS: 59 % (ref 32–75)
NEUTROPHILS: 61 % (ref 32–75)
NRBC: 0 PER 100 WBC
NRBC: 0 PER 100 WBC
PLATELET: 151 10*3/uL (ref 150–400)
PLATELET: 210 10*3/uL (ref 150–400)
RBC: 1.73 M/uL — ABNORMAL LOW (ref 4.10–5.70)
RBC: 2.18 M/uL — ABNORMAL LOW (ref 4.10–5.70)
RDW: 13.4 % (ref 11.5–14.5)
RDW: 13.3 % (ref 11.5–14.5)
WBC: 3.4 10*3/uL — ABNORMAL LOW (ref 4.1–11.1)
WBC: 4.4 10*3/uL (ref 4.1–11.1)

## 2019-09-16 LAB — RENAL FUNCTION PANEL
Albumin: 1.3 g/dL — ABNORMAL LOW (ref 3.5–5.0)
Albumin: 1.8 g/dL — ABNORMAL LOW (ref 3.5–5.0)
Anion gap: 10 mmol/L (ref 5–15)
Anion gap: 6 mmol/L (ref 5–15)
BUN/Creatinine ratio: 22 — ABNORMAL HIGH (ref 12–20)
BUN/Creatinine ratio: 19 (ref 12–20)
BUN: 41 MG/DL — ABNORMAL HIGH (ref 6–20)
BUN: 49 MG/DL — ABNORMAL HIGH (ref 6–20)
CO2: 17 mmol/L — ABNORMAL LOW (ref 21–32)
CO2: 22 mmol/L (ref 21–32)
Calcium: 5.4 MG/DL — CL (ref 8.5–10.1)
Calcium: 7.3 MG/DL — ABNORMAL LOW (ref 8.5–10.1)
Chloride: 122 mmol/L — ABNORMAL HIGH (ref 97–108)
Chloride: 115 mmol/L — ABNORMAL HIGH (ref 97–108)
Creatinine: 1.87 MG/DL — ABNORMAL HIGH (ref 0.70–1.30)
Creatinine: 2.62 MG/DL — ABNORMAL HIGH (ref 0.70–1.30)
GFR est AA: 44 mL/min/{1.73_m2} — ABNORMAL LOW (ref >60)
GFR est AA: 30 mL/min/{1.73_m2} — ABNORMAL LOW (ref >60)
GFR est non-AA: 37 mL/min/{1.73_m2} — ABNORMAL LOW (ref >60)
GFR est non-AA: 25 mL/min/{1.73_m2} — ABNORMAL LOW (ref >60)
Glucose: 69 mg/dL (ref 65–100)
Glucose: 81 mg/dL (ref 65–100)
Phosphorus: 1.1 MG/DL — ABNORMAL LOW (ref 2.6–4.7)
Phosphorus: 1.6 MG/DL — ABNORMAL LOW (ref 2.6–4.7)
Potassium: 3.4 mmol/L — ABNORMAL LOW (ref 3.5–5.1)
Potassium: 4.4 mmol/L (ref 3.5–5.1)
Sodium: 149 mmol/L — ABNORMAL HIGH (ref 136–145)
Sodium: 143 mmol/L (ref 136–145)

## 2019-09-16 MED ORDER — K PHOS DI & MONO-SOD PHOS MONO 250 MG TAB
250 mg | Freq: Two times a day (BID) | ORAL | Status: DC
Start: 2019-09-16 — End: 2019-09-17
  Administered 2019-09-16 – 2019-09-17 (×2): via ORAL

## 2019-09-16 MED FILL — METOPROLOL TARTRATE 25 MG TAB: 25 mg | ORAL | Qty: 1

## 2019-09-16 MED FILL — VIRT-PHOS NEUTRAL 250 MG TABLET: 250 mg | ORAL | Qty: 1

## 2019-09-16 MED FILL — CALCIUM ACETATE 667 MG TAB: 667 mg | ORAL | Qty: 1

## 2019-09-16 MED FILL — DESCOVY 200 MG-25 MG TABLET: 200-25 mg | ORAL | Qty: 1

## 2019-09-16 MED FILL — SODIUM BICARBONATE 650 MG TAB: 650 mg | ORAL | Qty: 1

## 2019-09-16 MED FILL — TAMSULOSIN SR 0.4 MG 24 HR CAP: 0.4 mg | ORAL | Qty: 1

## 2019-09-16 MED FILL — DAPSONE 100 MG TAB: 100 mg | ORAL | Qty: 1

## 2019-09-16 MED FILL — THIAMINE HCL 100 MG TAB: 100 mg | ORAL | Qty: 1

## 2019-09-16 MED FILL — TIVICAY 50 MG TABLET: 50 mg | ORAL | Qty: 1

## 2019-09-16 MED FILL — PFIZERPEN-G 5 MILLION UNIT SOLUTION FOR INJECTION: 5 million unit | INTRAMUSCULAR | Qty: 4

## 2019-09-16 MED FILL — BACITRACIN 500 UNIT/G TOPICAL PACKET: 500 unit/gram | CUTANEOUS | Qty: 1

## 2019-09-17 LAB — RENAL FUNCTION PANEL
Albumin: 1.9 g/dL — ABNORMAL LOW (ref 3.5–5.0)
Anion gap: 5 mmol/L (ref 5–15)
BUN/Creatinine ratio: 21 — ABNORMAL HIGH (ref 12–20)
BUN: 55 MG/DL — ABNORMAL HIGH (ref 6–20)
CO2: 22 mmol/L (ref 21–32)
Calcium: 7.6 MG/DL — ABNORMAL LOW (ref 8.5–10.1)
Chloride: 114 mmol/L — ABNORMAL HIGH (ref 97–108)
Creatinine: 2.58 MG/DL — ABNORMAL HIGH (ref 0.70–1.30)
GFR est AA: 31 mL/min/{1.73_m2} — ABNORMAL LOW (ref >60)
GFR est non-AA: 25 mL/min/{1.73_m2} — ABNORMAL LOW (ref >60)
Glucose: 94 mg/dL (ref 65–100)
Phosphorus: 1.8 MG/DL — ABNORMAL LOW (ref 2.6–4.7)
Potassium: 4.8 mmol/L (ref 3.5–5.1)
Sodium: 141 mmol/L (ref 136–145)

## 2019-09-17 LAB — CBC WITH AUTOMATED DIFF
ABS. BASOPHILS: 0 10*3/uL (ref 0.0–0.1)
ABS. EOSINOPHILS: 0.4 10*3/uL (ref 0.0–0.4)
ABS. IMM. GRANS.: 0 10*3/uL (ref 0.00–0.04)
ABS. LYMPHOCYTES: 1.1 10*3/uL (ref 0.8–3.5)
ABS. MONOCYTES: 0.5 10*3/uL (ref 0.0–1.0)
ABS. NEUTROPHILS: 2.9 10*3/uL (ref 1.8–8.0)
ABSOLUTE NRBC: 0 10*3/uL (ref 0.00–0.01)
BASOPHILS: 1 % (ref 0–1)
EOSINOPHILS: 8 % — ABNORMAL HIGH (ref 0–7)
HCT: 22.3 % — ABNORMAL LOW (ref 36.6–50.3)
HGB: 7.1 g/dL — ABNORMAL LOW (ref 12.1–17.0)
IMMATURE GRANULOCYTES: 0 % (ref 0.0–0.5)
LYMPHOCYTES: 22 % (ref 12–49)
MCH: 32.3 PG (ref 26.0–34.0)
MCHC: 31.8 g/dL (ref 30.0–36.5)
MCV: 101.4 FL — ABNORMAL HIGH (ref 80.0–99.0)
MONOCYTES: 10 % (ref 5–13)
MPV: 9 FL (ref 8.9–12.9)
NEUTROPHILS: 59 % (ref 32–75)
NRBC: 0 PER 100 WBC
PLATELET: 222 10*3/uL (ref 150–400)
RBC: 2.2 M/uL — ABNORMAL LOW (ref 4.10–5.70)
RDW: 13.6 % (ref 11.5–14.5)
WBC: 4.9 10*3/uL (ref 4.1–11.1)

## 2019-09-17 MED ORDER — K PHOS DI & MONO-SOD PHOS MONO 250 MG TAB
250 mg | Freq: Four times a day (QID) | ORAL | Status: DC
Start: 2019-09-17 — End: 2019-09-20
  Administered 2019-09-17 – 2019-09-20 (×13): via ORAL

## 2019-09-17 MED FILL — VIRT-PHOS NEUTRAL 250 MG TABLET: 250 mg | ORAL | Qty: 1

## 2019-09-17 MED FILL — METOPROLOL TARTRATE 25 MG TAB: 25 mg | ORAL | Qty: 1

## 2019-09-17 MED FILL — PFIZERPEN-G 5 MILLION UNIT SOLUTION FOR INJECTION: 5 million unit | INTRAMUSCULAR | Qty: 4

## 2019-09-17 MED FILL — SODIUM BICARBONATE 650 MG TAB: 650 mg | ORAL | Qty: 1

## 2019-09-17 MED FILL — DAPSONE 100 MG TAB: 100 mg | ORAL | Qty: 1

## 2019-09-17 MED FILL — DESCOVY 200 MG-25 MG TABLET: 200-25 mg | ORAL | Qty: 1

## 2019-09-17 MED FILL — BACITRACIN 500 UNIT/G TOPICAL PACKET: 500 unit/gram | CUTANEOUS | Qty: 1

## 2019-09-17 MED FILL — TIVICAY 50 MG TABLET: 50 mg | ORAL | Qty: 1

## 2019-09-17 MED FILL — VITAMIN D2 1,250 MCG (50,000 UNIT) CAPSULE: 1250 mcg (50,000 unit) | ORAL | Qty: 1

## 2019-09-17 MED FILL — THIAMINE HCL 100 MG TAB: 100 mg | ORAL | Qty: 1

## 2019-09-17 MED FILL — TAMSULOSIN SR 0.4 MG 24 HR CAP: 0.4 mg | ORAL | Qty: 1

## 2019-09-18 LAB — RENAL FUNCTION PANEL
Albumin: 1.8 g/dL — ABNORMAL LOW (ref 3.5–5.0)
Anion gap: 5 mmol/L (ref 5–15)
BUN/Creatinine ratio: 23 — ABNORMAL HIGH (ref 12–20)
BUN: 54 MG/DL — ABNORMAL HIGH (ref 6–20)
CO2: 23 mmol/L (ref 21–32)
Calcium: 7.2 MG/DL — ABNORMAL LOW (ref 8.5–10.1)
Chloride: 114 mmol/L — ABNORMAL HIGH (ref 97–108)
Creatinine: 2.39 MG/DL — ABNORMAL HIGH (ref 0.70–1.30)
GFR est AA: 33 mL/min/{1.73_m2} — ABNORMAL LOW (ref >60)
GFR est non-AA: 28 mL/min/{1.73_m2} — ABNORMAL LOW (ref >60)
Glucose: 87 mg/dL (ref 65–100)
Phosphorus: 2.1 MG/DL — ABNORMAL LOW (ref 2.6–4.7)
Potassium: 4.6 mmol/L (ref 3.5–5.1)
Sodium: 142 mmol/L (ref 136–145)

## 2019-09-18 LAB — SAMPLES BEING HELD: SAMPLES BEING HELD: 1

## 2019-09-18 LAB — MAGNESIUM: Magnesium: 1.4 mg/dL — ABNORMAL LOW (ref 1.6–2.4)

## 2019-09-18 MED ORDER — MAGNESIUM SULFATE 4 GRAM/100 ML IV PIGGY BACK
4 gram/100 mL ( %) | Freq: Once | INTRAVENOUS | Status: AC
Start: 2019-09-18 — End: 2019-09-18
  Administered 2019-09-18: 18:00:00 via INTRAVENOUS

## 2019-09-18 MED FILL — VIRT-PHOS NEUTRAL 250 MG TABLET: 250 mg | ORAL | Qty: 1

## 2019-09-18 MED FILL — METOPROLOL TARTRATE 25 MG TAB: 25 mg | ORAL | Qty: 1

## 2019-09-18 MED FILL — THIAMINE HCL 100 MG TAB: 100 mg | ORAL | Qty: 1

## 2019-09-18 MED FILL — PFIZERPEN-G 5 MILLION UNIT SOLUTION FOR INJECTION: 5 million unit | INTRAMUSCULAR | Qty: 4

## 2019-09-18 MED FILL — SODIUM BICARBONATE 650 MG TAB: 650 mg | ORAL | Qty: 1

## 2019-09-18 MED FILL — DAPSONE 100 MG TAB: 100 mg | ORAL | Qty: 1

## 2019-09-18 MED FILL — DESCOVY 200 MG-25 MG TABLET: 200-25 mg | ORAL | Qty: 1

## 2019-09-18 MED FILL — TAMSULOSIN SR 0.4 MG 24 HR CAP: 0.4 mg | ORAL | Qty: 1

## 2019-09-18 MED FILL — TIVICAY 50 MG TABLET: 50 mg | ORAL | Qty: 1

## 2019-09-18 MED FILL — MAGNESIUM SULFATE 4 GRAM/100 ML IV PIGGY BACK: 4 gram/100 mL ( %) | INTRAVENOUS | Qty: 100

## 2019-09-19 LAB — CULTURE, CSF W GRAM STAIN
Culture result:: NO GROWTH
GRAM STAIN: NONE SEEN
GRAM STAIN: NONE SEEN

## 2019-09-19 LAB — MAGNESIUM: Magnesium: 2.6 mg/dL — ABNORMAL HIGH (ref 1.6–2.4)

## 2019-09-19 MED FILL — METOPROLOL TARTRATE 25 MG TAB: 25 mg | ORAL | Qty: 1

## 2019-09-19 MED FILL — DESCOVY 200 MG-25 MG TABLET: 200-25 mg | ORAL | Qty: 1

## 2019-09-19 MED FILL — BACITRACIN 500 UNIT/G TOPICAL PACKET: 500 unit/gram | CUTANEOUS | Qty: 1

## 2019-09-19 MED FILL — VIRT-PHOS NEUTRAL 250 MG TABLET: 250 mg | ORAL | Qty: 1

## 2019-09-19 MED FILL — SODIUM BICARBONATE 650 MG TAB: 650 mg | ORAL | Qty: 1

## 2019-09-19 MED FILL — TAMSULOSIN SR 0.4 MG 24 HR CAP: 0.4 mg | ORAL | Qty: 1

## 2019-09-19 MED FILL — PFIZERPEN-G 5 MILLION UNIT SOLUTION FOR INJECTION: 5 million unit | INTRAMUSCULAR | Qty: 4

## 2019-09-19 MED FILL — PFIZERPEN-G 20 MILLION UNIT SOLUTION FOR INJECTION: 20 million unit | INTRAMUSCULAR | Qty: 4

## 2019-09-19 MED FILL — THIAMINE HCL 100 MG TAB: 100 mg | ORAL | Qty: 1

## 2019-09-19 MED FILL — DAPSONE 100 MG TAB: 100 mg | ORAL | Qty: 1

## 2019-09-19 MED FILL — TIVICAY 50 MG TABLET: 50 mg | ORAL | Qty: 1

## 2019-09-20 LAB — RENAL FUNCTION PANEL
Albumin: 1.9 g/dL — ABNORMAL LOW (ref 3.5–5.0)
Anion gap: 5 mmol/L (ref 5–15)
BUN/Creatinine ratio: 22 — ABNORMAL HIGH (ref 12–20)
BUN: 52 MG/DL — ABNORMAL HIGH (ref 6–20)
CO2: 25 mmol/L (ref 21–32)
Calcium: 7.8 MG/DL — ABNORMAL LOW (ref 8.5–10.1)
Chloride: 110 mmol/L — ABNORMAL HIGH (ref 97–108)
Creatinine: 2.37 MG/DL — ABNORMAL HIGH (ref 0.70–1.30)
GFR est AA: 34 mL/min/{1.73_m2} — ABNORMAL LOW (ref >60)
GFR est non-AA: 28 mL/min/{1.73_m2} — ABNORMAL LOW (ref >60)
Glucose: 103 mg/dL — ABNORMAL HIGH (ref 65–100)
Phosphorus: 3.2 MG/DL (ref 2.6–4.7)
Potassium: 5.2 mmol/L — ABNORMAL HIGH (ref 3.5–5.1)
Sodium: 140 mmol/L (ref 136–145)

## 2019-09-20 LAB — SAMPLES BEING HELD

## 2019-09-20 MED FILL — VIRT-PHOS NEUTRAL 250 MG TABLET: 250 mg | ORAL | Qty: 1

## 2019-09-20 MED FILL — SODIUM BICARBONATE 650 MG TAB: 650 mg | ORAL | Qty: 1

## 2019-09-20 MED FILL — RETACRIT 10,000 UNIT/ML INJECTION SOLUTION: 10000 unit/mL | INTRAMUSCULAR | Qty: 1

## 2019-09-20 MED FILL — METOPROLOL TARTRATE 25 MG TAB: 25 mg | ORAL | Qty: 1

## 2019-09-20 MED FILL — BACITRACIN 500 UNIT/G TOPICAL PACKET: 500 unit/gram | CUTANEOUS | Qty: 1

## 2019-09-20 MED FILL — THIAMINE HCL 100 MG TAB: 100 mg | ORAL | Qty: 1

## 2019-09-20 MED FILL — PFIZERPEN-G 20 MILLION UNIT SOLUTION FOR INJECTION: 20 million unit | INTRAMUSCULAR | Qty: 4

## 2019-09-20 MED FILL — TAMSULOSIN SR 0.4 MG 24 HR CAP: 0.4 mg | ORAL | Qty: 1

## 2019-09-20 MED FILL — PFIZERPEN-G 5 MILLION UNIT SOLUTION FOR INJECTION: 5 million unit | INTRAMUSCULAR | Qty: 4

## 2019-09-20 MED FILL — DAPSONE 100 MG TAB: 100 mg | ORAL | Qty: 1

## 2019-09-20 MED FILL — DESCOVY 200 MG-25 MG TABLET: 200-25 mg | ORAL | Qty: 1

## 2019-09-20 MED FILL — TIVICAY 50 MG TABLET: 50 mg | ORAL | Qty: 1

## 2019-09-21 ENCOUNTER — Inpatient Hospital Stay: Admit: 2019-09-21 | Payer: MEDICARE | Attending: Nephrology | Primary: Internal Medicine

## 2019-09-21 DIAGNOSIS — Z452 Encounter for adjustment and management of vascular access device: Secondary | ICD-10-CM

## 2019-09-21 LAB — RENAL FUNCTION PANEL
Albumin: 1.8 g/dL — ABNORMAL LOW (ref 3.5–5.0)
Anion gap: 5 mmol/L (ref 5–15)
BUN/Creatinine ratio: 20 (ref 12–20)
BUN: 49 MG/DL — ABNORMAL HIGH (ref 6–20)
CO2: 24 mmol/L (ref 21–32)
Calcium: 7.6 MG/DL — ABNORMAL LOW (ref 8.5–10.1)
Chloride: 110 mmol/L — ABNORMAL HIGH (ref 97–108)
Creatinine: 2.46 MG/DL — ABNORMAL HIGH (ref 0.70–1.30)
GFR est AA: 32 mL/min/{1.73_m2} — ABNORMAL LOW (ref >60)
GFR est non-AA: 27 mL/min/{1.73_m2} — ABNORMAL LOW (ref >60)
Glucose: 89 mg/dL (ref 65–100)
Phosphorus: 3.6 MG/DL (ref 2.6–4.7)
Potassium: 5.5 mmol/L — ABNORMAL HIGH (ref 3.5–5.1)
Sodium: 139 mmol/L (ref 136–145)

## 2019-09-21 LAB — SAMPLES BEING HELD

## 2019-09-21 MED ORDER — DOLUTEGRAVIR 50 MG TABLET
50 mg | ORAL_TABLET | Freq: Every day | ORAL | 0 refills | Status: DC
Start: 2019-09-21 — End: 2019-11-24

## 2019-09-21 MED ORDER — SODIUM ZIRCONIUM CYCLOSILICATE 10 GRAM ORAL POWDER PACKET
10 gram | ORAL | Status: AC
Start: 2019-09-21 — End: 2019-09-21
  Administered 2019-09-21: 17:00:00 via ORAL

## 2019-09-21 MED ORDER — THIAMINE HCL 100 MG TAB
100 mg | ORAL_TABLET | Freq: Every day | ORAL | 0 refills | Status: AC
Start: 2019-09-21 — End: 2019-10-22

## 2019-09-21 MED ORDER — DAPSONE 100 MG TAB
100 mg | ORAL_TABLET | Freq: Every day | ORAL | 0 refills | Status: DC
Start: 2019-09-21 — End: 2019-11-24
  Filled 2019-09-21: qty 30, 30d supply, fill #0

## 2019-09-21 MED ORDER — HEPARIN, PORCINE (PF) 100 UNIT/ML IV SYRINGE
100 unit/mL | INTRAVENOUS | Status: DC | PRN
Start: 2019-09-21 — End: 2019-09-25
  Administered 2019-09-21: 15:00:00

## 2019-09-21 MED ORDER — EMTRICITABINE 200 MG-TENOFOVIR ALAFENAMIDE FUMARATE 25 MG TABLET
200-25 mg | ORAL_TABLET | Freq: Every day | ORAL | 0 refills | Status: DC
Start: 2019-09-21 — End: 2019-11-24

## 2019-09-21 MED ORDER — POLYETHYLENE GLYCOL 3350 17 GRAM (100 %) ORAL POWDER PACKET
17 gram | PACK | Freq: Every day | ORAL | 0 refills | Status: AC
Start: 2019-09-21 — End: 2019-10-01

## 2019-09-21 MED ORDER — LIDOCAINE HCL 2 % (20 MG/ML) IJ SOLN
20 mg/mL (2 %) | Freq: Once | INTRAMUSCULAR | Status: AC
Start: 2019-09-21 — End: 2019-09-21
  Administered 2019-09-21: 15:00:00 via SUBCUTANEOUS

## 2019-09-21 MED ORDER — CEFAZOLIN 1 GRAM SOLUTION FOR INJECTION
1 gram | Freq: Once | INTRAMUSCULAR | Status: AC
Start: 2019-09-21 — End: 2019-09-21
  Administered 2019-09-21: 14:00:00 via INTRAVENOUS

## 2019-09-21 MED ORDER — FENTANYL CITRATE (PF) 50 MCG/ML IJ SOLN
50 mcg/mL | INTRAMUSCULAR | Status: DC | PRN
Start: 2019-09-21 — End: 2019-09-21
  Administered 2019-09-21: 15:00:00 via INTRAVENOUS

## 2019-09-21 MED ORDER — PENICILLIN G POTASSIUM 5,000,000 UNIT IJ SOLR
5 million unit | Freq: Three times a day (TID) | INTRAMUSCULAR | 0 refills | Status: AC
Start: 2019-09-21 — End: 2019-09-29

## 2019-09-21 MED ORDER — METOPROLOL TARTRATE 25 MG TAB
25 mg | ORAL_TABLET | Freq: Two times a day (BID) | ORAL | 1 refills | Status: DC
Start: 2019-09-21 — End: 2019-11-02

## 2019-09-21 MED ORDER — EPOETIN ALFA-EPBX 10,000 UNIT/ML INJECTION SOLUTION
10000 unit/mL | INTRAMUSCULAR | 0 refills | Status: AC
Start: 2019-09-21 — End: 2019-10-19

## 2019-09-21 MED ORDER — SODIUM CHLORIDE 0.9 % IV
INTRAVENOUS | Status: DC
Start: 2019-09-21 — End: 2019-09-21
  Administered 2019-09-21: 14:00:00 via INTRAVENOUS

## 2019-09-21 MED ORDER — MIDAZOLAM 1 MG/ML IJ SOLN
1 mg/mL | INTRAMUSCULAR | Status: DC | PRN
Start: 2019-09-21 — End: 2019-09-21

## 2019-09-21 MED FILL — DESCOVY 200 MG-25 MG TABLET: 200-25 mg | ORAL | Qty: 1

## 2019-09-21 MED FILL — SODIUM BICARBONATE 650 MG TAB: 650 mg | ORAL | Qty: 1

## 2019-09-21 MED FILL — METOPROLOL TARTRATE 25 MG TAB: 25 mg | ORAL | Qty: 1

## 2019-09-21 MED FILL — THIAMINE HCL 100 MG TAB: 100 mg | ORAL | Qty: 1

## 2019-09-21 MED FILL — XYLOCAINE 20 MG/ML (2 %) INJECTION SOLUTION: 20 mg/mL (2 %) | INTRAMUSCULAR | Qty: 20

## 2019-09-21 MED FILL — FENTANYL CITRATE (PF) 50 MCG/ML IJ SOLN: 50 mcg/mL | INTRAMUSCULAR | Qty: 2

## 2019-09-21 MED FILL — TAMSULOSIN SR 0.4 MG 24 HR CAP: 0.4 mg | ORAL | Qty: 1

## 2019-09-21 MED FILL — TIVICAY 50 MG TABLET: 50 mg | ORAL | Qty: 1

## 2019-09-21 MED FILL — DAPSONE 100 MG TAB: 100 mg | ORAL | Qty: 1

## 2019-09-21 MED FILL — LOKELMA 10 GRAM ORAL POWDER PACKET: 10 gram | ORAL | Qty: 1

## 2019-09-21 MED FILL — HEPARIN, PORCINE (PF) 100 UNIT/ML IV SYRINGE: 100 unit/mL | INTRAVENOUS | Qty: 5

## 2019-09-21 MED FILL — PFIZERPEN-G 5 MILLION UNIT SOLUTION FOR INJECTION: 5 million unit | INTRAMUSCULAR | Qty: 4

## 2019-09-21 MED FILL — MIDAZOLAM 1 MG/ML IJ SOLN: 1 mg/mL | INTRAMUSCULAR | Qty: 5

## 2019-09-21 MED FILL — CEFAZOLIN 1 GRAM SOLUTION FOR INJECTION: 1 gram | INTRAMUSCULAR | Qty: 2000

## 2019-09-23 LAB — URINALYSIS W/ REFLEX CULTURE
Bacteria: NEGATIVE /hpf
Bilirubin: NEGATIVE
Glucose: NEGATIVE mg/dL
Ketone: NEGATIVE mg/dL
Nitrites: NEGATIVE
Protein: 30 mg/dL — AB
RBC: >100 /hpf — ABNORMAL HIGH (ref 0–5)
Specific gravity: 1.011 (ref 1.003–1.030)
Urobilinogen: 0.1 EU/dL (ref 0.1–1.0)
WBC: >100 /hpf — ABNORMAL HIGH (ref 0–4)
pH (UA): 6 (ref 5.0–8.0)

## 2019-09-29 ENCOUNTER — Inpatient Hospital Stay: Admit: 2019-09-29 | Payer: MEDICARE | Primary: Internal Medicine

## 2019-09-29 DIAGNOSIS — N183 Chronic kidney disease, stage 3 unspecified: Secondary | ICD-10-CM

## 2019-09-29 MED ORDER — SODIUM CHLORIDE 0.9 % IV
INTRAVENOUS | Status: DC | PRN
Start: 2019-09-29 — End: 2019-10-01

## 2019-09-29 MED FILL — SODIUM CHLORIDE 0.9 % IV: INTRAVENOUS | Qty: 250

## 2019-09-30 LAB — TYPE & SCREEN
ABO/Rh(D): A POS
Antibody screen: NEGATIVE
Unit division: 0
Unit division: 0

## 2019-10-27 ENCOUNTER — Inpatient Hospital Stay
Admit: 2019-10-27 | Discharge: 2019-11-02 | Disposition: A | Discharge status: Home Health | Payer: MEDICARE | Source: Skilled Nursing Facility | Attending: Family Medicine | Admitting: Family Medicine

## 2019-10-27 ENCOUNTER — Emergency Department: Admit: 2019-10-27 | Payer: MEDICARE | Primary: Internal Medicine

## 2019-10-27 DIAGNOSIS — N179 Acute kidney failure, unspecified: Principal | ICD-10-CM

## 2019-10-27 LAB — EKG, 12 LEAD, INITIAL
Atrial Rate: 293 {beats}/min
Calculated P Axis: 96 degrees
Calculated R Axis: 28 degrees
Calculated T Axis: 79 degrees
Q-T Interval: 370 ms
QRS Duration: 68 ms
QTC Calculation (Bezet): 445 ms
Ventricular Rate: 87 {beats}/min

## 2019-10-28 ENCOUNTER — Inpatient Hospital Stay: Admit: 2019-10-28 | Payer: MEDICARE | Primary: Internal Medicine

## 2019-10-28 LAB — CBC WITH AUTOMATED DIFF
ABS. BASOPHILS: 0.1 10*3/uL (ref 0.0–0.1)
ABS. EOSINOPHILS: 0.5 10*3/uL — ABNORMAL HIGH (ref 0.0–0.4)
ABS. IMM. GRANS.: 0 10*3/uL (ref 0.00–0.04)
ABS. LYMPHOCYTES: 1.4 10*3/uL (ref 0.8–3.5)
ABS. MONOCYTES: 0.7 10*3/uL (ref 0.0–1.0)
ABS. NEUTROPHILS: 7.1 10*3/uL (ref 1.8–8.0)
ABSOLUTE NRBC: 0 10*3/uL (ref 0.00–0.01)
BASOPHILS: 1 % (ref 0–1)
EOSINOPHILS: 5 % (ref 0–7)
HCT: 32 % — ABNORMAL LOW (ref 36.6–50.3)
HGB: 10.3 g/dL — ABNORMAL LOW (ref 12.1–17.0)
IMMATURE GRANULOCYTES: 0 % (ref 0–0.5)
LYMPHOCYTES: 14 % (ref 12–49)
MCH: 33 PG (ref 26.0–34.0)
MCHC: 32.2 g/dL (ref 30.0–36.5)
MCV: 102.6 FL — ABNORMAL HIGH (ref 80.0–99.0)
MONOCYTES: 7 % (ref 5–13)
MPV: 8.6 FL — ABNORMAL LOW (ref 8.9–12.9)
NEUTROPHILS: 73 % (ref 32–75)
NRBC: 0 PER 100 WBC
PLATELET: 308 10*3/uL (ref 150–400)
RBC: 3.12 M/uL — ABNORMAL LOW (ref 4.10–5.70)
RDW: 14.4 % (ref 11.5–14.5)
WBC: 9.8 10*3/uL (ref 4.1–11.1)

## 2019-10-28 LAB — TROPONIN I: Troponin-I, Qt.: <0.05 ng/mL (ref <0.05)

## 2019-10-28 LAB — METABOLIC PANEL, COMPREHENSIVE
A-G Ratio: 0.5 — ABNORMAL LOW (ref 1.1–2.2)
ALT (SGPT): 9 U/L — ABNORMAL LOW (ref 12–78)
AST (SGOT): 7 U/L — ABNORMAL LOW (ref 15–37)
Albumin: 2.8 g/dL — ABNORMAL LOW (ref 3.5–5.0)
Alk. phosphatase: 79 U/L (ref 45–117)
Anion gap: 9 mmol/L (ref 5–15)
BUN/Creatinine ratio: 15 (ref 12–20)
BUN: 72 mg/dL — ABNORMAL HIGH (ref 6–20)
Bilirubin, total: 0.8 mg/dL (ref 0.2–1.0)
CO2: 19 mmol/L — ABNORMAL LOW (ref 21–32)
Calcium: 8.6 mg/dL (ref 8.5–10.1)
Chloride: 104 mmol/L (ref 97–108)
Creatinine: 4.66 mg/dL — ABNORMAL HIGH (ref 0.70–1.30)
GFR est AA: 15 mL/min/{1.73_m2} — ABNORMAL LOW (ref >60)
GFR est non-AA: 13 mL/min/{1.73_m2} — ABNORMAL LOW (ref >60)
Globulin: 6 g/dL — ABNORMAL HIGH (ref 2.0–4.0)
Glucose: 107 mg/dL — ABNORMAL HIGH (ref 65–100)
Potassium: 4.5 mmol/L (ref 3.5–5.1)
Protein, total: 8.8 g/dL — ABNORMAL HIGH (ref 6.4–8.2)
Sodium: 132 mmol/L — ABNORMAL LOW (ref 136–145)

## 2019-10-28 LAB — BNP: NT pro-BNP: 2802 pg/mL — ABNORMAL HIGH (ref <125)

## 2019-10-28 LAB — CK W/ REFLX CKMB: CK: 17 ng/mL — ABNORMAL LOW (ref 39–308)

## 2019-10-28 MED ORDER — ACETAMINOPHEN 650 MG RECTAL SUPPOSITORY
650 mg | Freq: Four times a day (QID) | RECTAL | Status: DC | PRN
Start: 2019-10-28 — End: 2019-11-02

## 2019-10-28 MED ORDER — EMTRICITABINE 200 MG-TENOFOVIR ALAFENAMIDE FUMARATE 25 MG TABLET
200-25 mg | Freq: Every day | ORAL | Status: DC
Start: 2019-10-28 — End: 2019-11-02
  Administered 2019-10-29 – 2019-11-02 (×5): via ORAL

## 2019-10-28 MED ORDER — SODIUM BICARBONATE 650 MG TAB
650 mg | Freq: Two times a day (BID) | ORAL | Status: DC
Start: 2019-10-28 — End: 2019-10-28
  Administered 2019-10-28: 17:00:00 via ORAL

## 2019-10-28 MED ORDER — ONDANSETRON 4 MG TAB, RAPID DISSOLVE
4 mg | Freq: Three times a day (TID) | ORAL | Status: DC | PRN
Start: 2019-10-28 — End: 2019-11-02

## 2019-10-28 MED ORDER — SODIUM BICARBONATE 650 MG TAB
650 mg | Freq: Two times a day (BID) | ORAL | Status: DC
Start: 2019-10-28 — End: 2019-11-02
  Administered 2019-10-29 – 2019-11-02 (×10): via ORAL

## 2019-10-28 MED ORDER — POLYETHYLENE GLYCOL 3350 17 GRAM (100 %) ORAL POWDER PACKET
17 gram | Freq: Every day | ORAL | Status: DC | PRN
Start: 2019-10-28 — End: 2019-11-02

## 2019-10-28 MED ORDER — HEPARIN (PORCINE) 5,000 UNIT/ML IJ SOLN
5000 unit/mL | Freq: Three times a day (TID) | INTRAMUSCULAR | Status: DC
Start: 2019-10-28 — End: 2019-11-02
  Administered 2019-10-28 – 2019-11-02 (×14): via SUBCUTANEOUS

## 2019-10-28 MED ORDER — ACETAMINOPHEN 325 MG TABLET
325 mg | Freq: Four times a day (QID) | ORAL | Status: DC | PRN
Start: 2019-10-28 — End: 2019-11-02
  Administered 2019-10-31: 02:00:00 via ORAL

## 2019-10-28 MED ORDER — DOLUTEGRAVIR 50 MG TABLET
50 mg | Freq: Every day | ORAL | Status: DC
Start: 2019-10-28 — End: 2019-11-02
  Administered 2019-10-29 – 2019-11-02 (×5): via ORAL

## 2019-10-28 MED ORDER — DAPSONE 25 MG TAB
25 mg | Freq: Every day | ORAL | Status: DC
Start: 2019-10-28 — End: 2019-11-02
  Administered 2019-10-29 – 2019-11-01 (×4): via ORAL

## 2019-10-28 MED ORDER — SODIUM CHLORIDE 0.9 % IV
INTRAVENOUS | Status: DC
Start: 2019-10-28 — End: 2019-11-02
  Administered 2019-10-28 – 2019-11-02 (×11): via INTRAVENOUS

## 2019-10-28 MED ORDER — METOPROLOL TARTRATE 25 MG TAB
25 mg | Freq: Two times a day (BID) | ORAL | Status: DC
Start: 2019-10-28 — End: 2019-10-30
  Administered 2019-10-28: 17:00:00 via ORAL

## 2019-10-28 MED ORDER — ERGOCALCIFEROL (VITAMIN D2) 50,000 UNIT CAP
1250 mcg (50,000 unit) | ORAL | Status: DC
Start: 2019-10-28 — End: 2019-11-02
  Administered 2019-10-28: 17:00:00 via ORAL

## 2019-10-28 MED ORDER — TAMSULOSIN SR 0.4 MG 24 HR CAP
0.4 mg | Freq: Every evening | ORAL | Status: DC
Start: 2019-10-28 — End: 2019-11-02
  Administered 2019-10-29 – 2019-11-02 (×5): via ORAL

## 2019-10-28 MED ORDER — ONDANSETRON (PF) 4 MG/2 ML INJECTION
4 mg/2 mL | Freq: Four times a day (QID) | INTRAMUSCULAR | Status: DC | PRN
Start: 2019-10-28 — End: 2019-11-02

## 2019-10-28 MED FILL — HEPARIN (PORCINE) 5,000 UNIT/ML IJ SOLN: 5000 unit/mL | INTRAMUSCULAR | Qty: 1

## 2019-10-28 MED FILL — SODIUM CHLORIDE 0.9 % IV: INTRAVENOUS | Qty: 1000

## 2019-10-28 MED FILL — METOPROLOL TARTRATE 25 MG TAB: 25 mg | ORAL | Qty: 1

## 2019-10-28 MED FILL — SODIUM BICARBONATE 650 MG TAB: 650 mg | ORAL | Qty: 1

## 2019-10-28 MED FILL — VITAMIN D2 1,250 MCG (50,000 UNIT) CAPSULE: 1250 mcg (50,000 unit) | ORAL | Qty: 1

## 2019-10-29 LAB — METABOLIC PANEL, COMPREHENSIVE
A-G Ratio: 0.5 — ABNORMAL LOW (ref 1.1–2.2)
ALT (SGPT): 8 U/L — ABNORMAL LOW (ref 12–78)
AST (SGOT): 8 U/L — ABNORMAL LOW (ref 15–37)
Albumin: 2.5 g/dL — ABNORMAL LOW (ref 3.5–5.0)
Alk. phosphatase: 68 U/L (ref 45–117)
Anion gap: 7 mmol/L (ref 5–15)
BUN/Creatinine ratio: 21 — ABNORMAL HIGH (ref 12–20)
BUN: 88 mg/dL — ABNORMAL HIGH (ref 6–20)
Bilirubin, total: 0.8 mg/dL (ref 0.2–1.0)
CO2: 21 mmol/L (ref 21–32)
Calcium: 8.4 mg/dL — ABNORMAL LOW (ref 8.5–10.1)
Chloride: 109 mmol/L — ABNORMAL HIGH (ref 97–108)
Creatinine: 4.16 mg/dL — ABNORMAL HIGH (ref 0.70–1.30)
GFR est AA: 18 mL/min/{1.73_m2} — ABNORMAL LOW (ref >60)
GFR est non-AA: 15 mL/min/{1.73_m2} — ABNORMAL LOW (ref >60)
Globulin: 5.4 g/dL — ABNORMAL HIGH (ref 2.0–4.0)
Glucose: 91 mg/dL (ref 65–100)
Potassium: 5.4 mmol/L — ABNORMAL HIGH (ref 3.5–5.1)
Protein, total: 7.9 g/dL (ref 6.4–8.2)
Sodium: 137 mmol/L (ref 136–145)

## 2019-10-29 LAB — CBC WITH AUTOMATED DIFF
ABS. BASOPHILS: 0.1 10*3/uL (ref 0.0–0.1)
ABS. EOSINOPHILS: 0.5 10*3/uL — ABNORMAL HIGH (ref 0.0–0.4)
ABS. IMM. GRANS.: 0 10*3/uL (ref 0.00–0.04)
ABS. LYMPHOCYTES: 1.6 10*3/uL (ref 0.8–3.5)
ABS. MONOCYTES: 0.5 10*3/uL (ref 0.0–1.0)
ABS. NEUTROPHILS: 6.4 10*3/uL (ref 1.8–8.0)
ABSOLUTE NRBC: 0 10*3/uL (ref 0.00–0.01)
BASOPHILS: 1 % (ref 0–1)
EOSINOPHILS: 5 % (ref 0–7)
HCT: 27.7 % — ABNORMAL LOW (ref 36.6–50.3)
HGB: 9.1 g/dL — ABNORMAL LOW (ref 12.1–17.0)
IMMATURE GRANULOCYTES: 0 % (ref 0–0.5)
LYMPHOCYTES: 18 % (ref 12–49)
MCH: 33.3 PG (ref 26.0–34.0)
MCHC: 32.9 g/dL (ref 30.0–36.5)
MCV: 101.5 FL — ABNORMAL HIGH (ref 80.0–99.0)
MONOCYTES: 6 % (ref 5–13)
MPV: 8.9 FL (ref 8.9–12.9)
NEUTROPHILS: 70 % (ref 32–75)
NRBC: 0 PER 100 WBC
PLATELET: 308 10*3/uL (ref 150–400)
RBC: 2.73 M/uL — ABNORMAL LOW (ref 4.10–5.70)
RDW: 14.4 % (ref 11.5–14.5)
WBC: 9.2 10*3/uL (ref 4.1–11.1)

## 2019-10-29 LAB — FERRITIN: Ferritin: 1029 ng/mL — ABNORMAL HIGH (ref 26–388)

## 2019-10-29 LAB — VITAMIN D, 25 HYDROXY: Vitamin D 25-Hydroxy: 89.7 ng/mL (ref 30–100)

## 2019-10-29 LAB — IRON PROFILE
Iron % saturation: 83 % — ABNORMAL HIGH (ref 20–50)
Iron: 110 ug/dL (ref 35–150)
TIBC: 132 ug/dL — ABNORMAL LOW (ref 250–450)

## 2019-10-29 LAB — PTH INTACT
Calcium: 8.1 mg/dL — ABNORMAL LOW (ref 8.5–10.1)
PTH, Intact: 86 pg/mL (ref 18.4–88.0)

## 2019-10-29 MED ORDER — SODIUM ZIRCONIUM CYCLOSILICATE 10 GRAM ORAL POWDER PACKET
10 gram | ORAL | Status: AC
Start: 2019-10-29 — End: 2019-10-29
  Administered 2019-10-29: 19:00:00 via ORAL

## 2019-10-29 MED ORDER — SODIUM CHLORIDE 0.9% BOLUS IV
0.9 % | Freq: Once | INTRAVENOUS | Status: AC
Start: 2019-10-29 — End: 2019-10-29
  Administered 2019-10-29: 17:00:00 via INTRAVENOUS

## 2019-10-29 MED FILL — LOKELMA 10 GRAM ORAL POWDER PACKET: 10 gram | ORAL | Qty: 1

## 2019-10-29 MED FILL — SODIUM BICARBONATE 650 MG TAB: 650 mg | ORAL | Qty: 2

## 2019-10-29 MED FILL — METOPROLOL TARTRATE 25 MG TAB: 25 mg | ORAL | Qty: 1

## 2019-10-29 MED FILL — HEPARIN (PORCINE) 5,000 UNIT/ML IJ SOLN: 5000 unit/mL | INTRAMUSCULAR | Qty: 1

## 2019-10-29 MED FILL — SODIUM CHLORIDE 0.9 % IV: INTRAVENOUS | Qty: 250

## 2019-10-29 MED FILL — TIVICAY 50 MG TABLET: 50 mg | ORAL | Qty: 1

## 2019-10-29 MED FILL — DESCOVY 200 MG-25 MG TABLET: 200-25 mg | ORAL | Qty: 1

## 2019-10-29 MED FILL — DAPSONE 25 MG TAB: 25 mg | ORAL | Qty: 4

## 2019-10-29 MED FILL — TAMSULOSIN SR 0.4 MG 24 HR CAP: 0.4 mg | ORAL | Qty: 1

## 2019-10-30 LAB — CBC WITH AUTOMATED DIFF
ABS. BASOPHILS: 0.1 10*3/uL (ref 0.0–0.1)
ABS. EOSINOPHILS: 0.5 10*3/uL — ABNORMAL HIGH (ref 0.0–0.4)
ABS. IMM. GRANS.: 0 10*3/uL (ref 0.00–0.04)
ABS. LYMPHOCYTES: 1.4 10*3/uL (ref 0.8–3.5)
ABS. MONOCYTES: 0.5 10*3/uL (ref 0.0–1.0)
ABS. NEUTROPHILS: 3.5 10*3/uL (ref 1.8–8.0)
ABSOLUTE NRBC: 0 10*3/uL (ref 0.00–0.01)
BASOPHILS: 1 % (ref 0–1)
EOSINOPHILS: 8 % — ABNORMAL HIGH (ref 0–7)
HCT: 24.1 % — ABNORMAL LOW (ref 36.6–50.3)
HGB: 7.6 g/dL — ABNORMAL LOW (ref 12.1–17.0)
IMMATURE GRANULOCYTES: 1 % — ABNORMAL HIGH (ref 0–0.5)
LYMPHOCYTES: 24 % (ref 12–49)
MCH: 32.5 PG (ref 26.0–34.0)
MCHC: 31.5 g/dL (ref 30.0–36.5)
MCV: 103 FL — ABNORMAL HIGH (ref 80.0–99.0)
MONOCYTES: 8 % (ref 5–13)
MPV: 9 FL (ref 8.9–12.9)
NEUTROPHILS: 58 % (ref 32–75)
NRBC: 0 PER 100 WBC
PLATELET: 281 10*3/uL (ref 150–400)
RBC: 2.34 M/uL — ABNORMAL LOW (ref 4.10–5.70)
RDW: 14.6 % — ABNORMAL HIGH (ref 11.5–14.5)
WBC: 5.9 10*3/uL (ref 4.1–11.1)

## 2019-10-30 LAB — RENAL FUNCTION PANEL
Albumin: 2.2 g/dL — ABNORMAL LOW (ref 3.5–5.0)
Anion gap: 6 mmol/L (ref 5–15)
BUN/Creatinine ratio: 27 — ABNORMAL HIGH (ref 12–20)
BUN: 81 mg/dL — ABNORMAL HIGH (ref 6–20)
CO2: 22 mmol/L (ref 21–32)
Calcium: 7.8 mg/dL — ABNORMAL LOW (ref 8.5–10.1)
Chloride: 112 mmol/L — ABNORMAL HIGH (ref 97–108)
Creatinine: 3.01 mg/dL — ABNORMAL HIGH (ref 0.70–1.30)
GFR est AA: 26 mL/min/{1.73_m2} — ABNORMAL LOW (ref >60)
GFR est non-AA: 21 mL/min/{1.73_m2} — ABNORMAL LOW (ref >60)
Glucose: 88 mg/dL (ref 65–100)
Phosphorus: 2.9 mg/dL (ref 2.6–4.7)
Potassium: 4.1 mmol/L (ref 3.5–5.1)
Sodium: 140 mmol/L (ref 136–145)

## 2019-10-30 LAB — METABOLIC PANEL, COMPREHENSIVE
A-G Ratio: 0.5 — ABNORMAL LOW (ref 1.1–2.2)
ALT (SGPT): 8 U/L — ABNORMAL LOW (ref 12–78)
AST (SGOT): 9 U/L — ABNORMAL LOW (ref 15–37)
Albumin: 2.2 g/dL — ABNORMAL LOW (ref 3.5–5.0)
Alk. phosphatase: 55 U/L (ref 45–117)
Anion gap: 5 mmol/L (ref 5–15)
BUN/Creatinine ratio: 27 — ABNORMAL HIGH (ref 12–20)
BUN: 81 mg/dL — ABNORMAL HIGH (ref 6–20)
Bilirubin, total: 0.7 mg/dL (ref 0.2–1.0)
CO2: 22 mmol/L (ref 21–32)
Calcium: 7.8 mg/dL — ABNORMAL LOW (ref 8.5–10.1)
Chloride: 112 mmol/L — ABNORMAL HIGH (ref 97–108)
Creatinine: 3.04 mg/dL — ABNORMAL HIGH (ref 0.70–1.30)
GFR est AA: 25 mL/min/{1.73_m2} — ABNORMAL LOW (ref >60)
GFR est non-AA: 21 mL/min/{1.73_m2} — ABNORMAL LOW (ref >60)
Globulin: 4.8 g/dL — ABNORMAL HIGH (ref 2.0–4.0)
Glucose: 87 mg/dL (ref 65–100)
Potassium: 4.1 mmol/L (ref 3.5–5.1)
Protein, total: 7 g/dL (ref 6.4–8.2)
Sodium: 139 mmol/L (ref 136–145)

## 2019-10-30 MED ORDER — MIDODRINE 5 MG TAB
5 mg | Freq: Three times a day (TID) | ORAL | Status: DC
Start: 2019-10-30 — End: 2019-11-02
  Administered 2019-10-30 (×2): via ORAL

## 2019-10-30 MED FILL — MIDODRINE 5 MG TAB: 5 mg | ORAL | Qty: 1

## 2019-10-30 MED FILL — SODIUM BICARBONATE 650 MG TAB: 650 mg | ORAL | Qty: 2

## 2019-10-30 MED FILL — HEPARIN (PORCINE) 5,000 UNIT/ML IJ SOLN: 5000 unit/mL | INTRAMUSCULAR | Qty: 1

## 2019-10-30 MED FILL — DAPSONE 25 MG TAB: 25 mg | ORAL | Qty: 4

## 2019-10-30 MED FILL — TAMSULOSIN SR 0.4 MG 24 HR CAP: 0.4 mg | ORAL | Qty: 1

## 2019-10-30 MED FILL — TIVICAY 50 MG TABLET: 50 mg | ORAL | Qty: 1

## 2019-10-30 MED FILL — DESCOVY 200 MG-25 MG TABLET: 200-25 mg | ORAL | Qty: 1

## 2019-10-31 LAB — METABOLIC PANEL, COMPREHENSIVE
A-G Ratio: 0.4 — ABNORMAL LOW (ref 1.1–2.2)
ALT (SGPT): 13 U/L (ref 12–78)
AST (SGOT): 14 U/L — ABNORMAL LOW (ref 15–37)
Albumin: 2.2 g/dL — ABNORMAL LOW (ref 3.5–5.0)
Alk. phosphatase: 55 U/L (ref 45–117)
Anion gap: 6 mmol/L (ref 5–15)
BUN/Creatinine ratio: 22 — ABNORMAL HIGH (ref 12–20)
BUN: 70 mg/dL — ABNORMAL HIGH (ref 6–20)
Bilirubin, total: 0.8 mg/dL (ref 0.2–1.0)
CO2: 22 mmol/L (ref 21–32)
Calcium: 8 mg/dL — ABNORMAL LOW (ref 8.5–10.1)
Chloride: 112 mmol/L — ABNORMAL HIGH (ref 97–108)
Creatinine: 3.17 mg/dL — ABNORMAL HIGH (ref 0.70–1.30)
GFR est AA: 24 mL/min/{1.73_m2} — ABNORMAL LOW (ref >60)
GFR est non-AA: 20 mL/min/{1.73_m2} — ABNORMAL LOW (ref >60)
Globulin: 5.1 g/dL — ABNORMAL HIGH (ref 2.0–4.0)
Glucose: 83 mg/dL (ref 65–100)
Potassium: 4.4 mmol/L (ref 3.5–5.1)
Protein, total: 7.3 g/dL (ref 6.4–8.2)
Sodium: 140 mmol/L (ref 136–145)

## 2019-10-31 LAB — RENAL FUNCTION PANEL
Albumin: 2.3 g/dL — ABNORMAL LOW (ref 3.5–5.0)
Anion gap: 7 mmol/L (ref 5–15)
BUN/Creatinine ratio: 23 — ABNORMAL HIGH (ref 12–20)
BUN: 72 mg/dL — ABNORMAL HIGH (ref 6–20)
CO2: 22 mmol/L (ref 21–32)
Calcium: 8 mg/dL — ABNORMAL LOW (ref 8.5–10.1)
Chloride: 112 mmol/L — ABNORMAL HIGH (ref 97–108)
Creatinine: 3.17 mg/dL — ABNORMAL HIGH (ref 0.70–1.30)
GFR est AA: 24 mL/min/{1.73_m2} — ABNORMAL LOW (ref >60)
GFR est non-AA: 20 mL/min/{1.73_m2} — ABNORMAL LOW (ref >60)
Glucose: 82 mg/dL (ref 65–100)
Phosphorus: 2.8 mg/dL (ref 2.6–4.7)
Potassium: 4.4 mmol/L (ref 3.5–5.1)
Sodium: 141 mmol/L (ref 136–145)

## 2019-10-31 LAB — CBC WITH AUTOMATED DIFF
ABS. BASOPHILS: 0.1 10*3/uL (ref 0.0–0.1)
ABS. EOSINOPHILS: 0.5 10*3/uL — ABNORMAL HIGH (ref 0.0–0.4)
ABS. IMM. GRANS.: 0 10*3/uL (ref 0.00–0.04)
ABS. LYMPHOCYTES: 1.4 10*3/uL (ref 0.8–3.5)
ABS. MONOCYTES: 0.4 10*3/uL (ref 0.0–1.0)
ABS. NEUTROPHILS: 3.1 10*3/uL (ref 1.8–8.0)
ABSOLUTE NRBC: 0 10*3/uL (ref 0.00–0.01)
BASOPHILS: 1 % (ref 0–1)
EOSINOPHILS: 9 % — ABNORMAL HIGH (ref 0–7)
HCT: 24.3 % — ABNORMAL LOW (ref 36.6–50.3)
HGB: 7.7 g/dL — ABNORMAL LOW (ref 12.1–17.0)
IMMATURE GRANULOCYTES: 0 % (ref 0–0.5)
LYMPHOCYTES: 26 % (ref 12–49)
MCH: 32.5 PG (ref 26.0–34.0)
MCHC: 31.7 g/dL (ref 30.0–36.5)
MCV: 102.5 FL — ABNORMAL HIGH (ref 80.0–99.0)
MONOCYTES: 7 % (ref 5–13)
MPV: 9.2 FL (ref 8.9–12.9)
NEUTROPHILS: 57 % (ref 32–75)
NRBC: 0 PER 100 WBC
PLATELET: 294 10*3/uL (ref 150–400)
RBC: 2.37 M/uL — ABNORMAL LOW (ref 4.10–5.70)
RDW: 15 % — ABNORMAL HIGH (ref 11.5–14.5)
WBC: 5.4 10*3/uL (ref 4.1–11.1)

## 2019-10-31 MED ORDER — METOPROLOL SUCCINATE SR 50 MG 24 HR TAB
50 mg | Freq: Every day | ORAL | Status: DC
Start: 2019-10-31 — End: 2019-11-02
  Administered 2019-11-01 – 2019-11-02 (×2): via ORAL

## 2019-10-31 MED FILL — HEPARIN (PORCINE) 5,000 UNIT/ML IJ SOLN: 5000 unit/mL | INTRAMUSCULAR | Qty: 1

## 2019-10-31 MED FILL — TIVICAY 50 MG TABLET: 50 mg | ORAL | Qty: 1

## 2019-10-31 MED FILL — TAMSULOSIN SR 0.4 MG 24 HR CAP: 0.4 mg | ORAL | Qty: 1

## 2019-10-31 MED FILL — SODIUM BICARBONATE 650 MG TAB: 650 mg | ORAL | Qty: 2

## 2019-10-31 MED FILL — MIDODRINE 5 MG TAB: 5 mg | ORAL | Qty: 1

## 2019-10-31 MED FILL — DAPSONE 25 MG TAB: 25 mg | ORAL | Qty: 4

## 2019-10-31 MED FILL — DESCOVY 200 MG-25 MG TABLET: 200-25 mg | ORAL | Qty: 1

## 2019-10-31 MED FILL — TYLENOL 325 MG TABLET: 325 mg | ORAL | Qty: 2

## 2019-11-01 LAB — RENAL FUNCTION PANEL
Albumin: 2 g/dL — ABNORMAL LOW (ref 3.5–5.0)
Anion gap: 7 mmol/L (ref 5–15)
BUN/Creatinine ratio: 25 — ABNORMAL HIGH (ref 12–20)
BUN: 67 mg/dL — ABNORMAL HIGH (ref 6–20)
CO2: 22 mmol/L (ref 21–32)
Calcium: 7.8 mg/dL — ABNORMAL LOW (ref 8.5–10.1)
Chloride: 113 mmol/L — ABNORMAL HIGH (ref 97–108)
Creatinine: 2.69 mg/dL — ABNORMAL HIGH (ref 0.70–1.30)
GFR est AA: 29 mL/min/{1.73_m2} — ABNORMAL LOW (ref >60)
GFR est non-AA: 24 mL/min/{1.73_m2} — ABNORMAL LOW (ref >60)
Glucose: 107 mg/dL — ABNORMAL HIGH (ref 65–100)
Phosphorus: 2.2 mg/dL — ABNORMAL LOW (ref 2.6–4.7)
Potassium: 4.3 mmol/L (ref 3.5–5.1)
Sodium: 142 mmol/L (ref 136–145)

## 2019-11-01 LAB — CBC WITH AUTOMATED DIFF
ABS. BASOPHILS: 0.1 10*3/uL (ref 0.0–0.1)
ABS. EOSINOPHILS: 0.6 10*3/uL — ABNORMAL HIGH (ref 0.0–0.4)
ABS. IMM. GRANS.: 0 10*3/uL (ref 0.00–0.04)
ABS. LYMPHOCYTES: 1.2 10*3/uL (ref 0.8–3.5)
ABS. MONOCYTES: 0.4 10*3/uL (ref 0.0–1.0)
ABS. NEUTROPHILS: 3.8 10*3/uL (ref 1.8–8.0)
ABSOLUTE NRBC: 0 10*3/uL (ref 0.00–0.01)
BASOPHILS: 1 % (ref 0–1)
EOSINOPHILS: 9 % — ABNORMAL HIGH (ref 0–7)
HCT: 22.6 % — ABNORMAL LOW (ref 36.6–50.3)
HGB: 7.2 g/dL — ABNORMAL LOW (ref 12.1–17.0)
IMMATURE GRANULOCYTES: 0 % (ref 0–0.5)
LYMPHOCYTES: 20 % (ref 12–49)
MCH: 32.6 PG (ref 26.0–34.0)
MCHC: 31.9 g/dL (ref 30.0–36.5)
MCV: 102.3 FL — ABNORMAL HIGH (ref 80.0–99.0)
MONOCYTES: 7 % (ref 5–13)
MPV: 8.9 FL (ref 8.9–12.9)
NEUTROPHILS: 63 % (ref 32–75)
NRBC: 0 PER 100 WBC
PLATELET: 283 10*3/uL (ref 150–400)
RBC: 2.21 M/uL — ABNORMAL LOW (ref 4.10–5.70)
RDW: 15.7 % — ABNORMAL HIGH (ref 11.5–14.5)
WBC: 6.1 10*3/uL (ref 4.1–11.1)

## 2019-11-01 LAB — METABOLIC PANEL, COMPREHENSIVE
A-G Ratio: 0.4 — ABNORMAL LOW (ref 1.1–2.2)
ALT (SGPT): 16 U/L (ref 12–78)
AST (SGOT): 15 U/L (ref 15–37)
Albumin: 2 g/dL — ABNORMAL LOW (ref 3.5–5.0)
Alk. phosphatase: 54 U/L (ref 45–117)
Anion gap: 8 mmol/L (ref 5–15)
BUN/Creatinine ratio: 24 — ABNORMAL HIGH (ref 12–20)
BUN: 66 mg/dL — ABNORMAL HIGH (ref 6–20)
Bilirubin, total: 0.6 mg/dL (ref 0.2–1.0)
CO2: 21 mmol/L (ref 21–32)
Calcium: 7.9 mg/dL — ABNORMAL LOW (ref 8.5–10.1)
Chloride: 113 mmol/L — ABNORMAL HIGH (ref 97–108)
Creatinine: 2.71 mg/dL — ABNORMAL HIGH (ref 0.70–1.30)
GFR est AA: 29 mL/min/{1.73_m2} — ABNORMAL LOW (ref >60)
GFR est non-AA: 24 mL/min/{1.73_m2} — ABNORMAL LOW (ref >60)
Globulin: 4.5 g/dL — ABNORMAL HIGH (ref 2.0–4.0)
Glucose: 106 mg/dL — ABNORMAL HIGH (ref 65–100)
Potassium: 4.3 mmol/L (ref 3.5–5.1)
Protein, total: 6.5 g/dL (ref 6.4–8.2)
Sodium: 142 mmol/L (ref 136–145)

## 2019-11-01 MED ORDER — METOPROLOL SUCCINATE SR 25 MG 24 HR TAB
25 mg | ORAL_TABLET | Freq: Every day | ORAL | 0 refills | Status: DC
Start: 2019-11-01 — End: 2020-01-02

## 2019-11-01 MED ORDER — LOPERAMIDE 2 MG CAP
2 mg | ORAL | Status: DC | PRN
Start: 2019-11-01 — End: 2019-11-02

## 2019-11-01 MED FILL — HEPARIN (PORCINE) 5,000 UNIT/ML IJ SOLN: 5000 unit/mL | INTRAMUSCULAR | Qty: 1

## 2019-11-01 MED FILL — SODIUM BICARBONATE 650 MG TAB: 650 mg | ORAL | Qty: 2

## 2019-11-01 MED FILL — TIVICAY 50 MG TABLET: 50 mg | ORAL | Qty: 1

## 2019-11-01 MED FILL — TAMSULOSIN SR 0.4 MG 24 HR CAP: 0.4 mg | ORAL | Qty: 1

## 2019-11-01 MED FILL — DAPSONE 25 MG TAB: 25 mg | ORAL | Qty: 4

## 2019-11-01 MED FILL — METOPROLOL SUCCINATE SR 50 MG 24 HR TAB: 50 mg | ORAL | Qty: 1

## 2019-11-01 MED FILL — DESCOVY 200 MG-25 MG TABLET: 200-25 mg | ORAL | Qty: 1

## 2019-11-02 MED FILL — TIVICAY 50 MG TABLET: 50 mg | ORAL | Qty: 1

## 2019-11-02 MED FILL — HEPARIN (PORCINE) 5,000 UNIT/ML IJ SOLN: 5000 unit/mL | INTRAMUSCULAR | Qty: 1

## 2019-11-02 MED FILL — TAMSULOSIN SR 0.4 MG 24 HR CAP: 0.4 mg | ORAL | Qty: 1

## 2019-11-02 MED FILL — DAPSONE 25 MG TAB: 25 mg | ORAL | Qty: 4

## 2019-11-02 MED FILL — DESCOVY 200 MG-25 MG TABLET: 200-25 mg | ORAL | Qty: 1

## 2019-11-02 MED FILL — METOPROLOL SUCCINATE SR 50 MG 24 HR TAB: 50 mg | ORAL | Qty: 1

## 2019-11-02 MED FILL — SODIUM BICARBONATE 650 MG TAB: 650 mg | ORAL | Qty: 2

## 2019-11-03 LAB — CULTURE, BLOOD: Culture result:: NO GROWTH

## 2019-11-04 LAB — CULTURE, BLOOD: Culture result:: NO GROWTH

## 2019-11-20 DIAGNOSIS — I480 Paroxysmal atrial fibrillation: Secondary | ICD-10-CM

## 2019-11-21 ENCOUNTER — Inpatient Hospital Stay
Admit: 2019-11-21 | Discharge: 2019-11-22 | Disposition: A | Discharge status: Skilled Nursing Facility | Payer: MEDICARE | Attending: Emergency Medicine

## 2019-11-21 ENCOUNTER — Emergency Department: Admit: 2019-11-21 | Payer: MEDICARE | Primary: Internal Medicine

## 2019-11-21 MED ORDER — OXYCODONE 5 MG TAB
5 mg | ORAL_TABLET | Freq: Four times a day (QID) | ORAL | 0 refills | Status: DC | PRN
Start: 2019-11-21 — End: 2019-11-21

## 2019-11-21 MED ORDER — OXYCODONE 5 MG TAB
5 mg | ORAL_TABLET | Freq: Four times a day (QID) | ORAL | 0 refills | Status: AC | PRN
Start: 2019-11-21 — End: 2019-11-26

## 2019-11-21 MED ORDER — MORPHINE 2 MG/ML INJECTION
2 mg/mL | INTRAMUSCULAR | Status: AC
Start: 2019-11-21 — End: 2019-11-21
  Administered 2019-11-21: 07:00:00 via INTRAVENOUS

## 2019-11-21 MED FILL — MORPHINE 2 MG/ML INJECTION: 2 mg/mL | INTRAMUSCULAR | Qty: 1

## 2019-11-22 LAB — METABOLIC PANEL, COMPREHENSIVE
A-G Ratio: 0.4 — ABNORMAL LOW (ref 1.1–2.2)
A-G Ratio: 0.4 — ABNORMAL LOW (ref 1.1–2.2)
ALT (SGPT): 11 U/L — ABNORMAL LOW (ref 12–78)
ALT (SGPT): 11 U/L — ABNORMAL LOW (ref 12–78)
AST (SGOT): 6 U/L — ABNORMAL LOW (ref 15–37)
AST (SGOT): 15 U/L (ref 15–37)
Albumin: 2.5 g/dL — ABNORMAL LOW (ref 3.5–5.0)
Albumin: 2.4 g/dL — ABNORMAL LOW (ref 3.5–5.0)
Alk. phosphatase: 109 U/L (ref 45–117)
Alk. phosphatase: 107 U/L (ref 45–117)
Anion gap: 13 mmol/L (ref 5–15)
Anion gap: 12 mmol/L (ref 5–15)
BUN/Creatinine ratio: 20 (ref 12–20)
BUN/Creatinine ratio: 20 (ref 12–20)
BUN: 99 mg/dL — ABNORMAL HIGH (ref 6–20)
BUN: 96 mg/dL — ABNORMAL HIGH (ref 6–20)
Bilirubin, total: 0.6 mg/dL (ref 0.2–1.0)
Bilirubin, total: 0.5 mg/dL (ref 0.2–1.0)
CO2: 19 mmol/L — ABNORMAL LOW (ref 21–32)
CO2: 19 mmol/L — ABNORMAL LOW (ref 21–32)
Calcium: 9.1 mg/dL (ref 8.5–10.1)
Calcium: 8.8 mg/dL (ref 8.5–10.1)
Chloride: 97 mmol/L (ref 97–108)
Chloride: 98 mmol/L (ref 97–108)
Creatinine: 5.05 mg/dL — ABNORMAL HIGH (ref 0.70–1.30)
Creatinine: 4.87 mg/dL — ABNORMAL HIGH (ref 0.70–1.30)
GFR est AA: 14 mL/min/{1.73_m2} — ABNORMAL LOW (ref >60)
GFR est AA: 15 mL/min/{1.73_m2} — ABNORMAL LOW (ref >60)
GFR est non-AA: 12 mL/min/{1.73_m2} — ABNORMAL LOW (ref >60)
GFR est non-AA: 12 mL/min/{1.73_m2} — ABNORMAL LOW (ref >60)
Globulin: 6.9 g/dL — ABNORMAL HIGH (ref 2.0–4.0)
Globulin: 6.1 g/dL — ABNORMAL HIGH (ref 2.0–4.0)
Glucose: 135 mg/dL — ABNORMAL HIGH (ref 65–100)
Glucose: 115 mg/dL — ABNORMAL HIGH (ref 65–100)
Potassium: 5.4 mmol/L — ABNORMAL HIGH (ref 3.5–5.1)
Potassium: 5.9 mmol/L — ABNORMAL HIGH (ref 3.5–5.1)
Protein, total: 9.4 g/dL — ABNORMAL HIGH (ref 6.4–8.2)
Protein, total: 8.5 g/dL — ABNORMAL HIGH (ref 6.4–8.2)
Sodium: 129 mmol/L — ABNORMAL LOW (ref 136–145)
Sodium: 129 mmol/L — ABNORMAL LOW (ref 136–145)

## 2019-11-22 LAB — CBC WITH AUTOMATED DIFF
ABS. BASOPHILS: 0 10*3/uL (ref 0.0–0.1)
ABS. BASOPHILS: 0 10*3/uL (ref 0.0–0.1)
ABS. EOSINOPHILS: 0 10*3/uL (ref 0.0–0.4)
ABS. EOSINOPHILS: 0.2 10*3/uL (ref 0.0–0.4)
ABS. IMM. GRANS.: 0 10*3/uL
ABS. IMM. GRANS.: 0.2 10*3/uL — ABNORMAL HIGH (ref 0.00–0.04)
ABS. LYMPHOCYTES: 1.4 10*3/uL (ref 0.8–3.5)
ABS. LYMPHOCYTES: 0.5 10*3/uL — ABNORMAL LOW (ref 0.8–3.5)
ABS. MONOCYTES: 1.4 10*3/uL — ABNORMAL HIGH (ref 0.0–1.0)
ABS. MONOCYTES: 0.7 10*3/uL (ref 0.0–1.0)
ABS. NEUTROPHILS: 14.5 10*3/uL — ABNORMAL HIGH (ref 1.8–8.0)
ABS. NEUTROPHILS: 12.7 10*3/uL — ABNORMAL HIGH (ref 1.8–8.0)
ABSOLUTE NRBC: 0 10*3/uL (ref 0.00–0.01)
ABSOLUTE NRBC: 0 10*3/uL (ref 0.00–0.01)
BASOPHILS: 0 % (ref 0–1)
BASOPHILS: 0 % (ref 0–1)
EOSINOPHILS: 0 % (ref 0–7)
EOSINOPHILS: 2 % (ref 0–7)
HCT: 29.5 % — ABNORMAL LOW (ref 36.6–50.3)
HCT: 29.3 % — ABNORMAL LOW (ref 36.6–50.3)
HGB: 9.7 g/dL — ABNORMAL LOW (ref 12.1–17.0)
HGB: 9.6 g/dL — ABNORMAL LOW (ref 12.1–17.0)
IMMATURE GRANULOCYTES: 0 %
IMMATURE GRANULOCYTES: 1 % — ABNORMAL HIGH (ref 0–0.5)
LYMPHOCYTES: 8 % — ABNORMAL LOW (ref 12–49)
LYMPHOCYTES: 3 % — ABNORMAL LOW (ref 12–49)
MCH: 33.2 PG (ref 26.0–34.0)
MCH: 33 PG (ref 26.0–34.0)
MCHC: 32.9 g/dL (ref 30.0–36.5)
MCHC: 32.8 g/dL (ref 30.0–36.5)
MCV: 101 FL — ABNORMAL HIGH (ref 80.0–99.0)
MCV: 100.7 FL — ABNORMAL HIGH (ref 80.0–99.0)
MONOCYTES: 8 % (ref 5–13)
MONOCYTES: 5 % (ref 5–13)
MPV: 10.6 FL (ref 8.9–12.9)
MPV: 10.4 FL (ref 8.9–12.9)
NEUTROPHILS: 84 % — ABNORMAL HIGH (ref 32–75)
NEUTROPHILS: 89 % — ABNORMAL HIGH (ref 32–75)
NRBC: 0 PER 100 WBC
NRBC: 0 PER 100 WBC
PLATELET: 311 10*3/uL (ref 150–400)
PLATELET: 296 10*3/uL (ref 150–400)
RBC: 2.92 M/uL — ABNORMAL LOW (ref 4.10–5.70)
RBC: 2.91 M/uL — ABNORMAL LOW (ref 4.10–5.70)
RDW: 15.1 % — ABNORMAL HIGH (ref 11.5–14.5)
RDW: 15.4 % — ABNORMAL HIGH (ref 11.5–14.5)
WBC: 17.3 10*3/uL — ABNORMAL HIGH (ref 4.1–11.1)
WBC: 14.3 10*3/uL — ABNORMAL HIGH (ref 4.1–11.1)

## 2019-11-22 LAB — URINALYSIS W/ REFLEX CULTURE
Bacteria: NEGATIVE /hpf
Bilirubin: NEGATIVE
Glucose: NEGATIVE mg/dL
Ketone: NEGATIVE mg/dL
Nitrites: NEGATIVE
Protein: 100 mg/dL — AB
RBC: >100 /hpf — ABNORMAL HIGH (ref 0–5)
Specific gravity: 1.01 (ref 1.003–1.030)
Urobilinogen: 0.1 EU/dL (ref 0.1–1.0)
WBC: >100 /hpf — ABNORMAL HIGH (ref 0–4)
pH (UA): 6 (ref 5.0–8.0)

## 2019-11-22 LAB — TROPONIN I: Troponin-I, Qt.: <0.05 ng/mL (ref <0.05)

## 2019-11-22 LAB — MAGNESIUM: Magnesium: 2.6 mg/dL — ABNORMAL HIGH (ref 1.6–2.4)

## 2019-11-22 LAB — LACTIC ACID: Lactic acid: 1.1 mmol/L (ref 0.4–2.0)

## 2019-11-22 MED ORDER — SODIUM POLYSTYRENE SULFONATE 15 GRAM/60 ML ORAL SUSPENSION
15 gram/60 mL | ORAL | Status: AC
Start: 2019-11-22 — End: 2019-11-22
  Administered 2019-11-22: 18:00:00 via ORAL

## 2019-11-22 MED ORDER — POLYETHYLENE GLYCOL 3350 17 GRAM (100 %) ORAL POWDER PACKET
17 gram | Freq: Every day | ORAL | Status: DC | PRN
Start: 2019-11-22 — End: 2019-12-01
  Administered 2019-11-28: 18:00:00 via ORAL

## 2019-11-22 MED ORDER — SODIUM CHLORIDE 0.9 % IV
INTRAVENOUS | Status: DC
Start: 2019-11-22 — End: 2019-11-23
  Administered 2019-11-22 – 2019-11-23 (×4): via INTRAVENOUS

## 2019-11-22 MED ORDER — ONDANSETRON 4 MG TAB, RAPID DISSOLVE
4 mg | Freq: Three times a day (TID) | ORAL | Status: DC | PRN
Start: 2019-11-22 — End: 2019-12-01

## 2019-11-22 MED ORDER — ACETAMINOPHEN 650 MG RECTAL SUPPOSITORY
650 mg | Freq: Four times a day (QID) | RECTAL | Status: DC | PRN
Start: 2019-11-22 — End: 2019-12-01

## 2019-11-22 MED ORDER — ONDANSETRON (PF) 4 MG/2 ML INJECTION
4 mg/2 mL | Freq: Four times a day (QID) | INTRAMUSCULAR | Status: DC | PRN
Start: 2019-11-22 — End: 2019-12-01
  Administered 2019-11-23: 01:00:00 via INTRAVENOUS

## 2019-11-22 MED ORDER — DAPSONE 25 MG TAB
25 mg | Freq: Every day | ORAL | Status: DC
Start: 2019-11-22 — End: 2019-12-01
  Administered 2019-11-22 – 2019-12-01 (×7): via ORAL

## 2019-11-22 MED ORDER — HEPARIN (PORCINE) 5,000 UNIT/ML IJ SOLN
5000 unit/mL | Freq: Three times a day (TID) | INTRAMUSCULAR | Status: DC
Start: 2019-11-22 — End: 2019-12-01
  Administered 2019-11-22 – 2019-12-01 (×22): via SUBCUTANEOUS

## 2019-11-22 MED ORDER — ACETAMINOPHEN 325 MG TABLET
325 mg | Freq: Four times a day (QID) | ORAL | Status: DC | PRN
Start: 2019-11-22 — End: 2019-12-01
  Administered 2019-11-22 – 2019-11-28 (×4): via ORAL

## 2019-11-22 MED ORDER — ERGOCALCIFEROL (VITAMIN D2) 50,000 UNIT CAP
1250 mcg (50,000 unit) | ORAL | Status: DC
Start: 2019-11-22 — End: 2019-12-01
  Administered 2019-11-22 – 2019-11-29 (×3): via ORAL

## 2019-11-22 MED ORDER — SODIUM BICARBONATE 650 MG TAB
650 mg | Freq: Two times a day (BID) | ORAL | Status: DC
Start: 2019-11-22 — End: 2019-12-01
  Administered 2019-11-22 – 2019-12-01 (×19): via ORAL

## 2019-11-22 MED ORDER — EMTRICITABINE 200 MG-TENOFOVIR ALAFENAMIDE FUMARATE 25 MG TABLET
200-25 mg | Freq: Every day | ORAL | Status: DC
Start: 2019-11-22 — End: 2019-12-01
  Administered 2019-11-23 – 2019-12-01 (×9): via ORAL

## 2019-11-22 MED ORDER — DOLUTEGRAVIR 50 MG TABLET
50 mg | Freq: Every day | ORAL | Status: DC
Start: 2019-11-22 — End: 2019-12-01
  Administered 2019-11-22 – 2019-12-01 (×10): via ORAL

## 2019-11-22 MED ORDER — TAMSULOSIN SR 0.4 MG 24 HR CAP
0.4 mg | Freq: Every evening | ORAL | Status: DC
Start: 2019-11-22 — End: 2019-12-01
  Administered 2019-11-22 – 2019-12-01 (×10): via ORAL

## 2019-11-22 MED FILL — DAPSONE 25 MG TAB: 25 mg | ORAL | Qty: 4

## 2019-11-22 MED FILL — SODIUM CHLORIDE 0.9 % IV: INTRAVENOUS | Qty: 1000

## 2019-11-22 MED FILL — HEPARIN (PORCINE) 5,000 UNIT/ML IJ SOLN: 5000 unit/mL | INTRAMUSCULAR | Qty: 1

## 2019-11-22 MED FILL — TYLENOL 325 MG TABLET: 325 mg | ORAL | Qty: 2

## 2019-11-22 MED FILL — SPS (WITH SORBITOL) 15 GRAM-20 GRAM/60 ML ORAL SUSPENSION: 15-20 gram/60 mL | ORAL | Qty: 60

## 2019-11-22 MED FILL — TIVICAY 50 MG TABLET: 50 mg | ORAL | Qty: 1

## 2019-11-22 MED FILL — VITAMIN D2 1,250 MCG (50,000 UNIT) CAPSULE: 1250 mcg (50,000 unit) | ORAL | Qty: 1

## 2019-11-22 MED FILL — SODIUM BICARBONATE 650 MG TAB: 650 mg | ORAL | Qty: 1

## 2019-11-22 MED FILL — TAMSULOSIN SR 0.4 MG 24 HR CAP: 0.4 mg | ORAL | Qty: 1

## 2019-11-23 LAB — METABOLIC PANEL, COMPREHENSIVE
A-G Ratio: 0.4 — ABNORMAL LOW (ref 1.1–2.2)
ALT (SGPT): 11 U/L — ABNORMAL LOW (ref 12–78)
AST (SGOT): 18 U/L (ref 15–37)
Albumin: 2.1 g/dL — ABNORMAL LOW (ref 3.5–5.0)
Alk. phosphatase: 108 U/L (ref 45–117)
Anion gap: 13 mmol/L (ref 5–15)
BUN/Creatinine ratio: 20 (ref 12–20)
BUN: 75 mg/dL — ABNORMAL HIGH (ref 6–20)
Bilirubin, total: 0.5 mg/dL (ref 0.2–1.0)
CO2: 15 mmol/L — CL (ref 21–32)
Calcium: 8 mg/dL — ABNORMAL LOW (ref 8.5–10.1)
Chloride: 106 mmol/L (ref 97–108)
Creatinine: 3.7 mg/dL — ABNORMAL HIGH (ref 0.70–1.30)
GFR est AA: 20 mL/min/{1.73_m2} — ABNORMAL LOW (ref >60)
GFR est non-AA: 17 mL/min/{1.73_m2} — ABNORMAL LOW (ref >60)
Globulin: 5.3 g/dL — ABNORMAL HIGH (ref 2.0–4.0)
Glucose: 101 mg/dL — ABNORMAL HIGH (ref 65–100)
Potassium: 4.6 mmol/L (ref 3.5–5.1)
Protein, total: 7.4 g/dL (ref 6.4–8.2)
Sodium: 134 mmol/L — ABNORMAL LOW (ref 136–145)

## 2019-11-23 LAB — CBC WITH AUTOMATED DIFF
ABS. BASOPHILS: 0 10*3/uL (ref 0.0–0.1)
ABS. EOSINOPHILS: 0.3 10*3/uL (ref 0.0–0.4)
ABS. IMM. GRANS.: 0 10*3/uL
ABS. LYMPHOCYTES: 0.8 10*3/uL (ref 0.8–3.5)
ABS. MONOCYTES: 0.5 10*3/uL (ref 0.0–1.0)
ABS. NEUTROPHILS: 10.9 10*3/uL — ABNORMAL HIGH (ref 1.8–8.0)
ABSOLUTE NRBC: 0 10*3/uL (ref 0.00–0.01)
BASOPHILS: 0 % (ref 0–1)
EOSINOPHILS: 2 % (ref 0–7)
HCT: 28.7 % — ABNORMAL LOW (ref 36.6–50.3)
HGB: 9.5 g/dL — ABNORMAL LOW (ref 12.1–17.0)
IMMATURE GRANULOCYTES: 0 %
LYMPHOCYTES: 6 % — ABNORMAL LOW (ref 12–49)
MCH: 33.5 PG (ref 26.0–34.0)
MCHC: 33.1 g/dL (ref 30.0–36.5)
MCV: 101.1 FL — ABNORMAL HIGH (ref 80.0–99.0)
MONOCYTES: 4 % — ABNORMAL LOW (ref 5–13)
NEUTROPHILS: 88 % — ABNORMAL HIGH (ref 32–75)
NRBC: 0 PER 100 WBC
PLATELET: 214 10*3/uL (ref 150–400)
RBC: 2.84 M/uL — ABNORMAL LOW (ref 4.10–5.70)
RDW: 15.9 % — ABNORMAL HIGH (ref 11.5–14.5)
WBC: 12.5 10*3/uL — ABNORMAL HIGH (ref 4.1–11.1)

## 2019-11-23 LAB — EKG, 12 LEAD, INITIAL
Atrial Rate: 416 {beats}/min
Calculated P Axis: 109 degrees
Calculated R Axis: 15 degrees
Calculated T Axis: 99 degrees
Q-T Interval: 370 ms
QRS Duration: 86 ms
QTC Calculation (Bezet): 457 ms
Ventricular Rate: 92 {beats}/min

## 2019-11-23 LAB — PSA SCREENING (SCREENING): Prostate Specific Ag: 5.4 ng/mL — ABNORMAL HIGH (ref 0.01–4.0)

## 2019-11-23 MED ORDER — DILTIAZEM HCL 5 MG/ML IV SOLN
5 mg/mL | Freq: Once | INTRAVENOUS | Status: AC
Start: 2019-11-23 — End: 2019-11-23
  Administered 2019-11-23: 13:00:00 via INTRAVENOUS

## 2019-11-23 MED ORDER — SODIUM CHLORIDE 0.9% BOLUS IV
0.9 % | Freq: Once | INTRAVENOUS | Status: AC
Start: 2019-11-23 — End: 2019-11-22
  Administered 2019-11-23: 03:00:00 via INTRAVENOUS

## 2019-11-23 MED ORDER — WATER FOR INJECTION, STERILE INJECTION
1 gram | INTRAMUSCULAR | Status: AC
Start: 2019-11-23 — End: 2019-11-25
  Administered 2019-11-23 – 2019-11-25 (×3): via INTRAVENOUS

## 2019-11-23 MED ORDER — MIDODRINE 5 MG TAB
5 mg | Freq: Two times a day (BID) | ORAL | Status: DC
Start: 2019-11-23 — End: 2019-12-01
  Administered 2019-11-23 – 2019-12-01 (×18): via ORAL

## 2019-11-23 MED ORDER — SODIUM CHLORIDE 0.9% BOLUS IV
0.9 % | Freq: Once | INTRAVENOUS | Status: AC
Start: 2019-11-23 — End: 2019-11-22
  Administered 2019-11-23: 04:00:00 via INTRAVENOUS

## 2019-11-23 MED ORDER — SODIUM BICARBONATE 8.4 % (1 MEQ/ML) IV SYRG
8.4 % (1 mEq/mL) | Freq: Once | INTRAVENOUS | Status: AC
Start: 2019-11-23 — End: 2019-11-23
  Administered 2019-11-23: 13:00:00 via INTRAVENOUS

## 2019-11-23 MED FILL — HEPARIN (PORCINE) 5,000 UNIT/ML IJ SOLN: 5000 unit/mL | INTRAMUSCULAR | Qty: 1

## 2019-11-23 MED FILL — ONDANSETRON (PF) 4 MG/2 ML INJECTION: 4 mg/2 mL | INTRAMUSCULAR | Qty: 2

## 2019-11-23 MED FILL — TIVICAY 50 MG TABLET: 50 mg | ORAL | Qty: 1

## 2019-11-23 MED FILL — DILTIAZEM HCL 5 MG/ML IV SOLN: 5 mg/mL | INTRAVENOUS | Qty: 2

## 2019-11-23 MED FILL — TAMSULOSIN SR 0.4 MG 24 HR CAP: 0.4 mg | ORAL | Qty: 1

## 2019-11-23 MED FILL — MIDODRINE 5 MG TAB: 5 mg | ORAL | Qty: 2

## 2019-11-23 MED FILL — SODIUM BICARBONATE 8.4 % (1 MEQ/ML) IV SYRG: 8.4 % (1 mEq/mL) | INTRAVENOUS | Qty: 50

## 2019-11-23 MED FILL — DESCOVY 200 MG-25 MG TABLET: 200-25 mg | ORAL | Qty: 1

## 2019-11-23 MED FILL — TYLENOL 325 MG TABLET: 325 mg | ORAL | Qty: 2

## 2019-11-23 MED FILL — SODIUM BICARBONATE 650 MG TAB: 650 mg | ORAL | Qty: 1

## 2019-11-23 MED FILL — SODIUM CHLORIDE 0.9 % IV: INTRAVENOUS | Qty: 500

## 2019-11-23 MED FILL — SODIUM CHLORIDE 0.9 % IV: INTRAVENOUS | Qty: 250

## 2019-11-23 MED FILL — CEFTRIAXONE 1 GRAM SOLUTION FOR INJECTION: 1 gram | INTRAMUSCULAR | Qty: 1

## 2019-11-24 LAB — CULTURE, URINE: Colony Count: >100000

## 2019-11-24 LAB — EKG, 12 LEAD, INITIAL
Atrial Rate: 359 {beats}/min
Calculated P Axis: 95 degrees
Calculated R Axis: 1 degrees
Calculated T Axis: 92 degrees
Q-T Interval: 334 ms
QRS Duration: 70 ms
QTC Calculation (Bezet): 455 ms
Ventricular Rate: 112 {beats}/min

## 2019-11-24 MED ORDER — MIDODRINE 10 MG TAB
10 mg | ORAL_TABLET | Freq: Two times a day (BID) | ORAL | 0 refills | Status: AC
Start: 2019-11-24 — End: 2019-12-24

## 2019-11-24 MED ORDER — EMTRICITABINE 200 MG-TENOFOVIR ALAFENAMIDE FUMARATE 25 MG TABLET
200-25 mg | ORAL_TABLET | Freq: Every day | ORAL | 0 refills | Status: DC
Start: 2019-11-24 — End: 2020-01-02

## 2019-11-24 MED ORDER — DOLUTEGRAVIR 50 MG TABLET
50 mg | ORAL_TABLET | Freq: Every day | ORAL | 0 refills | Status: DC
Start: 2019-11-24 — End: 2020-01-02

## 2019-11-24 MED ORDER — DOXYCYCLINE 100 MG CAP
100 mg | ORAL_CAPSULE | Freq: Two times a day (BID) | ORAL | 0 refills | Status: DC
Start: 2019-11-24 — End: 2020-01-02

## 2019-11-24 MED ORDER — DOXYCYCLINE HYCLATE 100 MG CAP
100 mg | Freq: Two times a day (BID) | ORAL | Status: DC
Start: 2019-11-24 — End: 2019-12-01
  Administered 2019-11-24 – 2019-12-01 (×15): via ORAL

## 2019-11-24 MED ORDER — DAPSONE 100 MG TAB
100 mg | ORAL_TABLET | Freq: Every day | ORAL | 0 refills | Status: DC
Start: 2019-11-24 — End: 2020-01-02

## 2019-11-24 MED FILL — SODIUM BICARBONATE 650 MG TAB: 650 mg | ORAL | Qty: 1

## 2019-11-24 MED FILL — TIVICAY 50 MG TABLET: 50 mg | ORAL | Qty: 1

## 2019-11-24 MED FILL — CEFTRIAXONE 1 GRAM SOLUTION FOR INJECTION: 1 gram | INTRAMUSCULAR | Qty: 1

## 2019-11-24 MED FILL — TAMSULOSIN SR 0.4 MG 24 HR CAP: 0.4 mg | ORAL | Qty: 1

## 2019-11-24 MED FILL — DESCOVY 200 MG-25 MG TABLET: 200-25 mg | ORAL | Qty: 1

## 2019-11-24 MED FILL — DAPSONE 25 MG TAB: 25 mg | ORAL | Qty: 4

## 2019-11-24 MED FILL — HEPARIN (PORCINE) 5,000 UNIT/ML IJ SOLN: 5000 unit/mL | INTRAMUSCULAR | Qty: 1

## 2019-11-24 MED FILL — MIDODRINE 5 MG TAB: 5 mg | ORAL | Qty: 2

## 2019-11-24 MED FILL — DOXYCYCLINE HYCLATE 100 MG CAP: 100 mg | ORAL | Qty: 1

## 2019-11-25 MED FILL — HEPARIN (PORCINE) 5,000 UNIT/ML IJ SOLN: 5000 unit/mL | INTRAMUSCULAR | Qty: 1

## 2019-11-25 MED FILL — DOXYCYCLINE HYCLATE 100 MG CAP: 100 mg | ORAL | Qty: 1

## 2019-11-25 MED FILL — MIDODRINE 5 MG TAB: 5 mg | ORAL | Qty: 2

## 2019-11-25 MED FILL — TAMSULOSIN SR 0.4 MG 24 HR CAP: 0.4 mg | ORAL | Qty: 1

## 2019-11-25 MED FILL — CEFTRIAXONE 1 GRAM SOLUTION FOR INJECTION: 1 gram | INTRAMUSCULAR | Qty: 1

## 2019-11-25 MED FILL — DESCOVY 200 MG-25 MG TABLET: 200-25 mg | ORAL | Qty: 1

## 2019-11-25 MED FILL — TIVICAY 50 MG TABLET: 50 mg | ORAL | Qty: 1

## 2019-11-25 MED FILL — SODIUM BICARBONATE 650 MG TAB: 650 mg | ORAL | Qty: 1

## 2019-11-26 MED FILL — TIVICAY 50 MG TABLET: 50 mg | ORAL | Qty: 1

## 2019-11-26 MED FILL — HEPARIN (PORCINE) 5,000 UNIT/ML IJ SOLN: 5000 unit/mL | INTRAMUSCULAR | Qty: 1

## 2019-11-26 MED FILL — VITAMIN D2 1,250 MCG (50,000 UNIT) CAPSULE: 1250 mcg (50,000 unit) | ORAL | Qty: 1

## 2019-11-26 MED FILL — DOXYCYCLINE HYCLATE 100 MG CAP: 100 mg | ORAL | Qty: 1

## 2019-11-26 MED FILL — SODIUM BICARBONATE 650 MG TAB: 650 mg | ORAL | Qty: 1

## 2019-11-26 MED FILL — MIDODRINE 5 MG TAB: 5 mg | ORAL | Qty: 2

## 2019-11-26 MED FILL — DESCOVY 200 MG-25 MG TABLET: 200-25 mg | ORAL | Qty: 1

## 2019-11-26 MED FILL — TAMSULOSIN SR 0.4 MG 24 HR CAP: 0.4 mg | ORAL | Qty: 1

## 2019-11-27 LAB — CBC WITH AUTOMATED DIFF
ABS. BASOPHILS: 0 10*3/uL (ref 0.0–0.1)
ABS. EOSINOPHILS: 0.8 10*3/uL — ABNORMAL HIGH (ref 0.0–0.4)
ABS. IMM. GRANS.: 0 10*3/uL (ref 0.00–0.04)
ABS. LYMPHOCYTES: 1.5 10*3/uL (ref 0.8–3.5)
ABS. MONOCYTES: 0.5 10*3/uL (ref 0.0–1.0)
ABS. NEUTROPHILS: 4.3 10*3/uL (ref 1.8–8.0)
ABSOLUTE NRBC: 0 10*3/uL (ref 0.00–0.01)
BASOPHILS: 0 % (ref 0–1)
EOSINOPHILS: 11 % — ABNORMAL HIGH (ref 0–7)
HCT: 24.1 % — ABNORMAL LOW (ref 36.6–50.3)
HGB: 7.6 g/dL — ABNORMAL LOW (ref 12.1–17.0)
IMMATURE GRANULOCYTES: 0 % (ref 0–0.5)
LYMPHOCYTES: 21 % (ref 12–49)
MCH: 32.8 PG (ref 26.0–34.0)
MCHC: 31.5 g/dL (ref 30.0–36.5)
MCV: 103.9 FL — ABNORMAL HIGH (ref 80.0–99.0)
MONOCYTES: 7 % (ref 5–13)
MPV: 10.7 FL (ref 8.9–12.9)
NEUTROPHILS: 61 % (ref 32–75)
NRBC: 0 PER 100 WBC
PLATELET: 181 10*3/uL (ref 150–400)
RBC: 2.32 M/uL — ABNORMAL LOW (ref 4.10–5.70)
RDW: 16.7 % — ABNORMAL HIGH (ref 11.5–14.5)
WBC: 7.1 10*3/uL (ref 4.1–11.1)

## 2019-11-27 LAB — METABOLIC PANEL, COMPREHENSIVE
A-G Ratio: 0.3 — ABNORMAL LOW (ref 1.1–2.2)
ALT (SGPT): 10 U/L — ABNORMAL LOW (ref 12–78)
AST (SGOT): 9 U/L — ABNORMAL LOW (ref 15–37)
Albumin: 1.7 g/dL — ABNORMAL LOW (ref 3.5–5.0)
Alk. phosphatase: 85 U/L (ref 45–117)
Anion gap: 5 mmol/L (ref 5–15)
BUN/Creatinine ratio: 16 (ref 12–20)
BUN: 31 mg/dL — ABNORMAL HIGH (ref 6–20)
Bilirubin, total: 0.4 mg/dL (ref 0.2–1.0)
CO2: 28 mmol/L (ref 21–32)
Calcium: 8.1 mg/dL — ABNORMAL LOW (ref 8.5–10.1)
Chloride: 100 mmol/L (ref 97–108)
Creatinine: 1.95 mg/dL — ABNORMAL HIGH (ref 0.70–1.30)
GFR est AA: 42 mL/min/{1.73_m2} — ABNORMAL LOW (ref >60)
GFR est non-AA: 35 mL/min/{1.73_m2} — ABNORMAL LOW (ref >60)
Globulin: 5.3 g/dL — ABNORMAL HIGH (ref 2.0–4.0)
Glucose: 97 mg/dL (ref 65–100)
Potassium: 4 mmol/L (ref 3.5–5.1)
Protein, total: 7 g/dL (ref 6.4–8.2)
Sodium: 133 mmol/L — ABNORMAL LOW (ref 136–145)

## 2019-11-27 MED FILL — TAMSULOSIN SR 0.4 MG 24 HR CAP: 0.4 mg | ORAL | Qty: 1

## 2019-11-27 MED FILL — HEPARIN (PORCINE) 5,000 UNIT/ML IJ SOLN: 5000 unit/mL | INTRAMUSCULAR | Qty: 1

## 2019-11-27 MED FILL — MIDODRINE 5 MG TAB: 5 mg | ORAL | Qty: 2

## 2019-11-27 MED FILL — DAPSONE 25 MG TAB: 25 mg | ORAL | Qty: 4

## 2019-11-27 MED FILL — DOXYCYCLINE HYCLATE 100 MG CAP: 100 mg | ORAL | Qty: 1

## 2019-11-27 MED FILL — SODIUM BICARBONATE 650 MG TAB: 650 mg | ORAL | Qty: 1

## 2019-11-27 MED FILL — DESCOVY 200 MG-25 MG TABLET: 200-25 mg | ORAL | Qty: 1

## 2019-11-27 MED FILL — TIVICAY 50 MG TABLET: 50 mg | ORAL | Qty: 1

## 2019-11-28 LAB — CBC WITH AUTOMATED DIFF
ABS. BASOPHILS: 0 10*3/uL (ref 0.0–0.1)
ABS. EOSINOPHILS: 0.6 10*3/uL — ABNORMAL HIGH (ref 0.0–0.4)
ABS. IMM. GRANS.: 0 10*3/uL (ref 0.00–0.04)
ABS. LYMPHOCYTES: 0.8 10*3/uL (ref 0.8–3.5)
ABS. MONOCYTES: 0.4 10*3/uL (ref 0.0–1.0)
ABS. NEUTROPHILS: 7.4 10*3/uL (ref 1.8–8.0)
ABSOLUTE NRBC: 0 10*3/uL (ref 0.00–0.01)
BASOPHILS: 0 % (ref 0–1)
EOSINOPHILS: 7 % (ref 0–7)
HCT: 22.8 % — ABNORMAL LOW (ref 36.6–50.3)
HGB: 7.1 g/dL — ABNORMAL LOW (ref 12.1–17.0)
IMMATURE GRANULOCYTES: 0 % (ref 0–0.5)
LYMPHOCYTES: 9 % — ABNORMAL LOW (ref 12–49)
MCH: 32.4 PG (ref 26.0–34.0)
MCHC: 31.1 g/dL (ref 30.0–36.5)
MCV: 104.1 FL — ABNORMAL HIGH (ref 80.0–99.0)
MONOCYTES: 4 % — ABNORMAL LOW (ref 5–13)
MPV: 10.3 FL (ref 8.9–12.9)
NEUTROPHILS: 80 % — ABNORMAL HIGH (ref 32–75)
NRBC: 0 PER 100 WBC
PLATELET: 213 10*3/uL (ref 150–400)
RBC: 2.19 M/uL — ABNORMAL LOW (ref 4.10–5.70)
RDW: 16.7 % — ABNORMAL HIGH (ref 11.5–14.5)
WBC: 9.2 10*3/uL (ref 4.1–11.1)

## 2019-11-28 LAB — METABOLIC PANEL, COMPREHENSIVE
A-G Ratio: 0.3 — ABNORMAL LOW (ref 1.1–2.2)
ALT (SGPT): 10 U/L — ABNORMAL LOW (ref 12–78)
AST (SGOT): 7 U/L — ABNORMAL LOW (ref 15–37)
Albumin: 1.7 g/dL — ABNORMAL LOW (ref 3.5–5.0)
Alk. phosphatase: 75 U/L (ref 45–117)
Anion gap: 6 mmol/L (ref 5–15)
BUN/Creatinine ratio: 16 (ref 12–20)
BUN: 32 mg/dL — ABNORMAL HIGH (ref 6–20)
Bilirubin, total: 0.3 mg/dL (ref 0.2–1.0)
CO2: 28 mmol/L (ref 21–32)
Calcium: 8 mg/dL — ABNORMAL LOW (ref 8.5–10.1)
Chloride: 98 mmol/L (ref 97–108)
Creatinine: 1.99 mg/dL — ABNORMAL HIGH (ref 0.70–1.30)
GFR est AA: 41 mL/min/{1.73_m2} — ABNORMAL LOW (ref >60)
GFR est non-AA: 34 mL/min/{1.73_m2} — ABNORMAL LOW (ref >60)
Globulin: 5.2 g/dL — ABNORMAL HIGH (ref 2.0–4.0)
Glucose: 127 mg/dL — ABNORMAL HIGH (ref 65–100)
Potassium: 4.4 mmol/L (ref 3.5–5.1)
Protein, total: 6.9 g/dL (ref 6.4–8.2)
Sodium: 132 mmol/L — ABNORMAL LOW (ref 136–145)

## 2019-11-28 MED FILL — TIVICAY 50 MG TABLET: 50 mg | ORAL | Qty: 1

## 2019-11-28 MED FILL — MIDODRINE 5 MG TAB: 5 mg | ORAL | Qty: 2

## 2019-11-28 MED FILL — TYLENOL 325 MG TABLET: 325 mg | ORAL | Qty: 2

## 2019-11-28 MED FILL — DESCOVY 200 MG-25 MG TABLET: 200-25 mg | ORAL | Qty: 1

## 2019-11-28 MED FILL — DOXYCYCLINE HYCLATE 100 MG CAP: 100 mg | ORAL | Qty: 1

## 2019-11-28 MED FILL — SODIUM BICARBONATE 650 MG TAB: 650 mg | ORAL | Qty: 1

## 2019-11-28 MED FILL — POLYETHYLENE GLYCOL 3350 17 GRAM (100 %) ORAL POWDER PACKET: 17 gram | ORAL | Qty: 1

## 2019-11-28 MED FILL — HEPARIN (PORCINE) 5,000 UNIT/ML IJ SOLN: 5000 unit/mL | INTRAMUSCULAR | Qty: 1

## 2019-11-28 MED FILL — TAMSULOSIN SR 0.4 MG 24 HR CAP: 0.4 mg | ORAL | Qty: 1

## 2019-11-28 MED FILL — DAPSONE 25 MG TAB: 25 mg | ORAL | Qty: 4

## 2019-11-29 ENCOUNTER — Observation Stay: Admit: 2019-11-29 | Payer: MEDICARE | Primary: Internal Medicine

## 2019-11-29 LAB — CBC WITH AUTOMATED DIFF
ABS. BASOPHILS: 0.1 10*3/uL (ref 0.0–0.1)
ABS. EOSINOPHILS: 0.5 10*3/uL — ABNORMAL HIGH (ref 0.0–0.4)
ABS. IMM. GRANS.: 0 10*3/uL (ref 0.00–0.04)
ABS. LYMPHOCYTES: 1.2 10*3/uL (ref 0.8–3.5)
ABS. MONOCYTES: 0.4 10*3/uL (ref 0.0–1.0)
ABS. NEUTROPHILS: 5.1 10*3/uL (ref 1.8–8.0)
ABSOLUTE NRBC: 0 10*3/uL (ref 0.00–0.01)
BASOPHILS: 1 % (ref 0–1)
EOSINOPHILS: 6 % (ref 0–7)
HCT: 24.3 % — ABNORMAL LOW (ref 36.6–50.3)
HGB: 7.6 g/dL — ABNORMAL LOW (ref 12.1–17.0)
IMMATURE GRANULOCYTES: 0 % (ref 0–0.5)
LYMPHOCYTES: 16 % (ref 12–49)
MCH: 32.5 PG (ref 26.0–34.0)
MCHC: 31.3 g/dL (ref 30.0–36.5)
MCV: 103.8 FL — ABNORMAL HIGH (ref 80.0–99.0)
MONOCYTES: 6 % (ref 5–13)
MPV: 9.8 FL (ref 8.9–12.9)
NEUTROPHILS: 71 % (ref 32–75)
NRBC: 0 PER 100 WBC
PLATELET: 272 10*3/uL (ref 150–400)
RBC: 2.34 M/uL — ABNORMAL LOW (ref 4.10–5.70)
RDW: 16.6 % — ABNORMAL HIGH (ref 11.5–14.5)
WBC: 7.2 10*3/uL (ref 4.1–11.1)

## 2019-11-29 LAB — METABOLIC PANEL, COMPREHENSIVE
A-G Ratio: 0.3 — ABNORMAL LOW (ref 1.1–2.2)
ALT (SGPT): 9 U/L — ABNORMAL LOW (ref 12–78)
AST (SGOT): 9 U/L — ABNORMAL LOW (ref 15–37)
Albumin: 1.9 g/dL — ABNORMAL LOW (ref 3.5–5.0)
Alk. phosphatase: 75 U/L (ref 45–117)
Anion gap: 4 mmol/L — ABNORMAL LOW (ref 5–15)
BUN/Creatinine ratio: 14 (ref 12–20)
BUN: 30 mg/dL — ABNORMAL HIGH (ref 6–20)
Bilirubin, total: 0.4 mg/dL (ref 0.2–1.0)
CO2: 30 mmol/L (ref 21–32)
Calcium: 8.4 mg/dL — ABNORMAL LOW (ref 8.5–10.1)
Chloride: 100 mmol/L (ref 97–108)
Creatinine: 2.07 mg/dL — ABNORMAL HIGH (ref 0.70–1.30)
GFR est AA: 39 mL/min/{1.73_m2} — ABNORMAL LOW (ref >60)
GFR est non-AA: 32 mL/min/{1.73_m2} — ABNORMAL LOW (ref >60)
Globulin: 5.5 g/dL — ABNORMAL HIGH (ref 2.0–4.0)
Glucose: 162 mg/dL — ABNORMAL HIGH (ref 65–100)
Potassium: 4.4 mmol/L (ref 3.5–5.1)
Protein, total: 7.4 g/dL (ref 6.4–8.2)
Sodium: 134 mmol/L — ABNORMAL LOW (ref 136–145)

## 2019-11-29 MED ORDER — MEGESTROL 400 MG/10 ML ORAL SUSP
400 mg/10 mL (10 mL) | Freq: Every day | ORAL | Status: DC
Start: 2019-11-29 — End: 2019-12-01
  Administered 2019-11-30 – 2019-12-01 (×2): via ORAL

## 2019-11-29 MED FILL — TAMSULOSIN SR 0.4 MG 24 HR CAP: 0.4 mg | ORAL | Qty: 1

## 2019-11-29 MED FILL — HEPARIN (PORCINE) 5,000 UNIT/ML IJ SOLN: 5000 unit/mL | INTRAMUSCULAR | Qty: 1

## 2019-11-29 MED FILL — TIVICAY 50 MG TABLET: 50 mg | ORAL | Qty: 1

## 2019-11-29 MED FILL — DESCOVY 200 MG-25 MG TABLET: 200-25 mg | ORAL | Qty: 1

## 2019-11-29 MED FILL — MIDODRINE 5 MG TAB: 5 mg | ORAL | Qty: 2

## 2019-11-29 MED FILL — DAPSONE 25 MG TAB: 25 mg | ORAL | Qty: 4

## 2019-11-29 MED FILL — SODIUM BICARBONATE 650 MG TAB: 650 mg | ORAL | Qty: 1

## 2019-11-29 MED FILL — VITAMIN D2 1,250 MCG (50,000 UNIT) CAPSULE: 1250 mcg (50,000 unit) | ORAL | Qty: 1

## 2019-11-29 MED FILL — DOXYCYCLINE HYCLATE 100 MG CAP: 100 mg | ORAL | Qty: 1

## 2019-11-30 LAB — METABOLIC PANEL, COMPREHENSIVE
A-G Ratio: 0.3 — ABNORMAL LOW (ref 1.1–2.2)
ALT (SGPT): 10 U/L — ABNORMAL LOW (ref 12–78)
AST (SGOT): 12 U/L — ABNORMAL LOW (ref 15–37)
Albumin: 2 g/dL — ABNORMAL LOW (ref 3.5–5.0)
Alk. phosphatase: 79 U/L (ref 45–117)
Anion gap: 4 mmol/L — ABNORMAL LOW (ref 5–15)
BUN/Creatinine ratio: 17 (ref 12–20)
BUN: 34 mg/dL — ABNORMAL HIGH (ref 6–20)
Bilirubin, total: 0.3 mg/dL (ref 0.2–1.0)
CO2: 29 mmol/L (ref 21–32)
Calcium: 8.8 mg/dL (ref 8.5–10.1)
Chloride: 100 mmol/L (ref 97–108)
Creatinine: 1.96 mg/dL — ABNORMAL HIGH (ref 0.70–1.30)
GFR est AA: 42 mL/min/{1.73_m2} — ABNORMAL LOW (ref >60)
GFR est non-AA: 35 mL/min/{1.73_m2} — ABNORMAL LOW (ref >60)
Globulin: 5.8 g/dL — ABNORMAL HIGH (ref 2.0–4.0)
Glucose: 93 mg/dL (ref 65–100)
Potassium: 5 mmol/L (ref 3.5–5.1)
Protein, total: 7.8 g/dL (ref 6.4–8.2)
Sodium: 133 mmol/L — ABNORMAL LOW (ref 136–145)

## 2019-11-30 LAB — CBC WITH AUTOMATED DIFF
ABS. BASOPHILS: 0 10*3/uL (ref 0.0–0.1)
ABS. EOSINOPHILS: 0.5 10*3/uL — ABNORMAL HIGH (ref 0.0–0.4)
ABS. IMM. GRANS.: 0 10*3/uL (ref 0.00–0.04)
ABS. LYMPHOCYTES: 1.8 10*3/uL (ref 0.8–3.5)
ABS. MONOCYTES: 0.4 10*3/uL (ref 0.0–1.0)
ABS. NEUTROPHILS: 2.9 10*3/uL (ref 1.8–8.0)
ABSOLUTE NRBC: 0.02 10*3/uL — ABNORMAL HIGH (ref 0.00–0.01)
BASOPHILS: 1 % (ref 0–1)
EOSINOPHILS: 8 % — ABNORMAL HIGH (ref 0–7)
HCT: 21.3 % — ABNORMAL LOW (ref 36.6–50.3)
HGB: 6.9 g/dL — ABNORMAL LOW (ref 12.1–17.0)
IMMATURE GRANULOCYTES: 0 % (ref 0–0.5)
LYMPHOCYTES: 32 % (ref 12–49)
MCH: 32.7 PG (ref 26.0–34.0)
MCHC: 32.4 g/dL (ref 30.0–36.5)
MCV: 100.9 FL — ABNORMAL HIGH (ref 80.0–99.0)
MONOCYTES: 8 % (ref 5–13)
MPV: 10.2 FL (ref 8.9–12.9)
NEUTROPHILS: 51 % (ref 32–75)
NRBC: 0.4 PER 100 WBC — ABNORMAL HIGH
PLATELET: 295 10*3/uL (ref 150–400)
RBC: 2.11 M/uL — ABNORMAL LOW (ref 4.10–5.70)
RDW: 17 % — ABNORMAL HIGH (ref 11.5–14.5)
WBC: 5.6 10*3/uL (ref 4.1–11.1)

## 2019-11-30 LAB — MAGNESIUM: Magnesium: 2 mg/dL (ref 1.6–2.4)

## 2019-11-30 MED ORDER — ZINC OXIDE 17 %-WHITE PETROLATUM 57 % TOPICAL PASTE
17-57 % | Freq: Three times a day (TID) | CUTANEOUS | Status: DC
Start: 2019-11-30 — End: 2019-12-01
  Administered 2019-11-30 – 2019-12-01 (×5): via TOPICAL

## 2019-11-30 MED ORDER — SODIUM CHLORIDE 0.9 % IV
INTRAVENOUS | Status: DC | PRN
Start: 2019-11-30 — End: 2019-12-01

## 2019-11-30 MED FILL — HEPARIN (PORCINE) 5,000 UNIT/ML IJ SOLN: 5000 unit/mL | INTRAMUSCULAR | Qty: 1

## 2019-11-30 MED FILL — REMEDY PHYTOPLEX Z-GUARD (ZINC OXIDE) 17 %-57 % TOPICAL PASTE: 17-57 % | CUTANEOUS | Qty: 113

## 2019-11-30 MED FILL — TIVICAY 50 MG TABLET: 50 mg | ORAL | Qty: 1

## 2019-11-30 MED FILL — DAPSONE 25 MG TAB: 25 mg | ORAL | Qty: 4

## 2019-11-30 MED FILL — TAMSULOSIN SR 0.4 MG 24 HR CAP: 0.4 mg | ORAL | Qty: 1

## 2019-11-30 MED FILL — MIDODRINE 5 MG TAB: 5 mg | ORAL | Qty: 2

## 2019-11-30 MED FILL — SODIUM CHLORIDE 0.9 % IV: INTRAVENOUS | Qty: 250

## 2019-11-30 MED FILL — MEGESTROL 400 MG/10 ML ORAL SUSP: 400 mg/10 mL (10 mL) | ORAL | Qty: 10

## 2019-11-30 MED FILL — SODIUM BICARBONATE 650 MG TAB: 650 mg | ORAL | Qty: 1

## 2019-11-30 MED FILL — DOXYCYCLINE HYCLATE 100 MG CAP: 100 mg | ORAL | Qty: 1

## 2019-11-30 MED FILL — DESCOVY 200 MG-25 MG TABLET: 200-25 mg | ORAL | Qty: 1

## 2019-12-01 LAB — CBC WITH AUTOMATED DIFF
ABS. BASOPHILS: 0.1 10*3/uL (ref 0.0–0.1)
ABS. EOSINOPHILS: 0.4 10*3/uL (ref 0.0–0.4)
ABS. IMM. GRANS.: 0 10*3/uL (ref 0.00–0.04)
ABS. LYMPHOCYTES: 2 10*3/uL (ref 0.8–3.5)
ABS. MONOCYTES: 0.4 10*3/uL (ref 0.0–1.0)
ABS. NEUTROPHILS: 4.5 10*3/uL (ref 1.8–8.0)
ABSOLUTE NRBC: 0 10*3/uL (ref 0.00–0.01)
BASOPHILS: 1 % (ref 0–1)
EOSINOPHILS: 6 % (ref 0–7)
HCT: 29.1 % — ABNORMAL LOW (ref 36.6–50.3)
HGB: 9.3 g/dL — ABNORMAL LOW (ref 12.1–17.0)
IMMATURE GRANULOCYTES: 0 % (ref 0–0.5)
LYMPHOCYTES: 27 % (ref 12–49)
MCH: 31.4 PG (ref 26.0–34.0)
MCHC: 32 g/dL (ref 30.0–36.5)
MCV: 98.3 FL (ref 80.0–99.0)
MONOCYTES: 5 % (ref 5–13)
MPV: 9.6 FL (ref 8.9–12.9)
NEUTROPHILS: 61 % (ref 32–75)
NRBC: 0 PER 100 WBC
PLATELET: 348 10*3/uL (ref 150–400)
RBC: 2.96 M/uL — ABNORMAL LOW (ref 4.10–5.70)
RDW: 18.2 % — ABNORMAL HIGH (ref 11.5–14.5)
WBC: 7.4 10*3/uL (ref 4.1–11.1)

## 2019-12-01 LAB — METABOLIC PANEL, COMPREHENSIVE
A-G Ratio: 0.4 — ABNORMAL LOW (ref 1.1–2.2)
ALT (SGPT): 8 U/L — ABNORMAL LOW (ref 12–78)
AST (SGOT): 10 U/L — ABNORMAL LOW (ref 15–37)
Albumin: 2.2 g/dL — ABNORMAL LOW (ref 3.5–5.0)
Alk. phosphatase: 77 U/L (ref 45–117)
Anion gap: 7 mmol/L (ref 5–15)
BUN/Creatinine ratio: 19 (ref 12–20)
BUN: 40 mg/dL — ABNORMAL HIGH (ref 6–20)
Bilirubin, total: 0.5 mg/dL (ref 0.2–1.0)
CO2: 27 mmol/L (ref 21–32)
Calcium: 8.9 mg/dL (ref 8.5–10.1)
Chloride: 102 mmol/L (ref 97–108)
Creatinine: 2.13 mg/dL — ABNORMAL HIGH (ref 0.70–1.30)
GFR est AA: 38 mL/min/{1.73_m2} — ABNORMAL LOW (ref >60)
GFR est non-AA: 31 mL/min/{1.73_m2} — ABNORMAL LOW (ref >60)
Globulin: 6 g/dL — ABNORMAL HIGH (ref 2.0–4.0)
Glucose: 80 mg/dL (ref 65–100)
Potassium: 4.5 mmol/L (ref 3.5–5.1)
Protein, total: 8.2 g/dL (ref 6.4–8.2)
Sodium: 136 mmol/L (ref 136–145)

## 2019-12-01 LAB — TYPE & SCREEN
ABO/Rh(D): A POS
Antibody screen: NEGATIVE
Unit division: 0

## 2019-12-01 MED ORDER — HEPARIN, PORCINE (PF) 100 UNIT/ML IV SYRINGE
100 unit/mL | INTRAVENOUS | Status: DC | PRN
Start: 2019-12-01 — End: 2019-12-01

## 2019-12-01 MED ORDER — HEPARIN (PORCINE) 5,000 UNIT/ML IJ SOLN
5000 unit/mL | Freq: Once | INTRAMUSCULAR | Status: DC
Start: 2019-12-01 — End: 2019-12-01

## 2019-12-01 MED FILL — MIDODRINE 5 MG TAB: 5 mg | ORAL | Qty: 2

## 2019-12-01 MED FILL — SODIUM BICARBONATE 650 MG TAB: 650 mg | ORAL | Qty: 1

## 2019-12-01 MED FILL — DESCOVY 200 MG-25 MG TABLET: 200-25 mg | ORAL | Qty: 1

## 2019-12-01 MED FILL — HEPARIN (PORCINE) 5,000 UNIT/ML IJ SOLN: 5000 unit/mL | INTRAMUSCULAR | Qty: 1

## 2019-12-01 MED FILL — TIVICAY 50 MG TABLET: 50 mg | ORAL | Qty: 1

## 2019-12-01 MED FILL — TAMSULOSIN SR 0.4 MG 24 HR CAP: 0.4 mg | ORAL | Qty: 1

## 2019-12-01 MED FILL — HEPARIN LOCK FLUSH (PORCINE) (PF) 100 UNIT/ML INTRAVENOUS SYRINGE: 100 unit/mL | INTRAVENOUS | Qty: 5

## 2019-12-01 MED FILL — MEGESTROL 400 MG/10 ML ORAL SUSP: 400 mg/10 mL (10 mL) | ORAL | Qty: 10

## 2019-12-01 MED FILL — DOXYCYCLINE HYCLATE 100 MG CAP: 100 mg | ORAL | Qty: 1

## 2019-12-01 MED FILL — DAPSONE 25 MG TAB: 25 mg | ORAL | Qty: 4

## 2019-12-27 ENCOUNTER — Emergency Department: Admit: 2019-12-27 | Payer: MEDICARE | Primary: Internal Medicine

## 2019-12-27 ENCOUNTER — Inpatient Hospital Stay
Admit: 2019-12-27 | Discharge: 2020-01-02 | Disposition: A | Discharge status: Hospice | Payer: MEDICARE | Attending: Hospitalist | Admitting: Hospitalist

## 2019-12-27 DIAGNOSIS — A4102 Sepsis due to Methicillin resistant Staphylococcus aureus: Secondary | ICD-10-CM

## 2019-12-27 LAB — CBC WITH AUTOMATED DIFF
ABS. BASOPHILS: 0 10*3/uL (ref 0.0–0.1)
ABS. EOSINOPHILS: 0 10*3/uL (ref 0.0–0.4)
ABS. IMM. GRANS.: 0 10*3/uL
ABS. LYMPHOCYTES: 0.3 10*3/uL — ABNORMAL LOW (ref 0.8–3.5)
ABS. MONOCYTES: 0.3 10*3/uL (ref 0.0–1.0)
ABS. NEUTROPHILS: 16.5 10*3/uL — ABNORMAL HIGH (ref 1.8–8.0)
ABSOLUTE NRBC: 0 10*3/uL (ref 0.00–0.01)
BASOPHILS: 0 % (ref 0–1)
EOSINOPHILS: 0 % (ref 0–7)
HCT: 19.6 % — ABNORMAL LOW (ref 36.6–50.3)
HGB: 6.8 g/dL — ABNORMAL LOW (ref 12.1–17.0)
IMMATURE GRANULOCYTES: 0 %
LYMPHOCYTES: 2 % — ABNORMAL LOW (ref 12–49)
MCH: 31.1 PG (ref 26.0–34.0)
MCHC: 34.7 g/dL (ref 30.0–36.5)
MCV: 89.5 FL (ref 80.0–99.0)
MONOCYTES: 2 % — ABNORMAL LOW (ref 5–13)
MPV: 11.1 FL (ref 8.9–12.9)
NEUTROPHILS: 96 % — ABNORMAL HIGH (ref 32–75)
NRBC: 0 PER 100 WBC
PLATELET: 96 10*3/uL — ABNORMAL LOW (ref 150–400)
RBC: 2.19 M/uL — ABNORMAL LOW (ref 4.10–5.70)
RDW: 16.8 % — ABNORMAL HIGH (ref 11.5–14.5)
WBC: 17.1 10*3/uL — ABNORMAL HIGH (ref 4.1–11.1)

## 2019-12-27 LAB — EKG, 12 LEAD, INITIAL
Atrial Rate: 300 {beats}/min
Calculated P Axis: -95 degrees
Calculated R Axis: 22 degrees
Calculated T Axis: -65 degrees
Q-T Interval: 364 ms
QRS Duration: 66 ms
QTC Calculation (Bezet): 516 ms
Ventricular Rate: 121 {beats}/min

## 2019-12-27 LAB — METABOLIC PANEL, COMPREHENSIVE
A-G Ratio: 0.4 — ABNORMAL LOW (ref 1.1–2.2)
ALT (SGPT): 16 U/L (ref 12–78)
AST (SGOT): 14 U/L — ABNORMAL LOW (ref 15–37)
Albumin: 1.8 g/dL — ABNORMAL LOW (ref 3.5–5.0)
Alk. phosphatase: 105 U/L (ref 45–117)
Anion gap: 13 mmol/L (ref 5–15)
BUN/Creatinine ratio: 22 — ABNORMAL HIGH (ref 12–20)
BUN: 110 mg/dL — ABNORMAL HIGH (ref 6–20)
Bilirubin, total: 1.9 mg/dL — ABNORMAL HIGH (ref 0.2–1.0)
CO2: 19 mmol/L — ABNORMAL LOW (ref 21–32)
Calcium: 8.3 mg/dL — ABNORMAL LOW (ref 8.5–10.1)
Chloride: 96 mmol/L — ABNORMAL LOW (ref 97–108)
Creatinine: 4.89 mg/dL — ABNORMAL HIGH (ref 0.70–1.30)
GFR est AA: 15 mL/min/{1.73_m2} — ABNORMAL LOW (ref >60)
GFR est non-AA: 12 mL/min/{1.73_m2} — ABNORMAL LOW (ref >60)
Globulin: 5 g/dL — ABNORMAL HIGH (ref 2.0–4.0)
Glucose: 172 mg/dL — ABNORMAL HIGH (ref 65–100)
Potassium: 4.6 mmol/L (ref 3.5–5.1)
Protein, total: 6.8 g/dL (ref 6.4–8.2)
Sodium: 128 mmol/L — ABNORMAL LOW (ref 136–145)

## 2019-12-27 MED ORDER — SODIUM CHLORIDE 0.9 % IV
INTRAVENOUS | Status: DC | PRN
Start: 2019-12-27 — End: 2019-12-30

## 2019-12-27 MED ORDER — SODIUM CHLORIDE 0.9% BOLUS IV
0.9 % | Freq: Once | INTRAVENOUS | Status: AC
Start: 2019-12-27 — End: 2019-12-27
  Administered 2019-12-27: 17:00:00 via INTRAVENOUS

## 2019-12-27 MED ORDER — SODIUM CHLORIDE 0.9% BOLUS IV
0.9 % | Freq: Once | INTRAVENOUS | Status: AC
Start: 2019-12-27 — End: 2019-12-27
  Administered 2019-12-27: 20:00:00 via INTRAVENOUS

## 2019-12-27 MED ORDER — SODIUM CHLORIDE 0.9 % IJ SYRG
INTRAMUSCULAR | Status: DC | PRN
Start: 2019-12-27 — End: 2020-01-02
  Administered 2019-12-31: 01:00:00 via INTRAVENOUS

## 2019-12-27 MED FILL — SODIUM CHLORIDE 0.9 % IV: INTRAVENOUS | Qty: 250

## 2019-12-27 MED FILL — SODIUM CHLORIDE 0.9 % IV: INTRAVENOUS | Qty: 500

## 2019-12-27 MED FILL — SODIUM CHLORIDE 0.9 % IJ SYRG: INTRAMUSCULAR | Qty: 10

## 2019-12-27 MED FILL — SODIUM CHLORIDE 0.9 % IV: INTRAVENOUS | Qty: 1000

## 2019-12-28 LAB — MRSA SCREEN - PCR (NASAL): MRSA by PCR, Nasal: DETECTED — AB

## 2019-12-28 LAB — CBC WITH AUTOMATED DIFF
ABS. BASOPHILS: 0 10*3/uL (ref 0.0–0.1)
ABS. EOSINOPHILS: 0 10*3/uL (ref 0.0–0.4)
ABS. IMM. GRANS.: 0 10*3/uL
ABS. LYMPHOCYTES: 0.2 10*3/uL — ABNORMAL LOW (ref 0.8–3.5)
ABS. MONOCYTES: 0.6 10*3/uL (ref 0.0–1.0)
ABS. NEUTROPHILS: 9.9 10*3/uL — ABNORMAL HIGH (ref 1.8–8.0)
ABSOLUTE NRBC: 0 10*3/uL (ref 0.00–0.01)
BASOPHILS: 0 % (ref 0–1)
EOSINOPHILS: 0 % (ref 0–7)
HCT: 25.3 % — ABNORMAL LOW (ref 36.6–50.3)
HGB: 8.5 g/dL — ABNORMAL LOW (ref 12.1–17.0)
IMMATURE GRANULOCYTES: 0 %
LYMPHOCYTES: 2 % — ABNORMAL LOW (ref 12–49)
MCH: 30.1 PG (ref 26.0–34.0)
MCHC: 33.6 g/dL (ref 30.0–36.5)
MCV: 89.7 FL (ref 80.0–99.0)
MONOCYTES: 6 % (ref 5–13)
MPV: 11.8 FL (ref 8.9–12.9)
NEUTROPHILS: 92 % — ABNORMAL HIGH (ref 32–75)
NRBC: 0 PER 100 WBC
PLATELET: 66 10*3/uL — ABNORMAL LOW (ref 150–400)
RBC: 2.82 M/uL — ABNORMAL LOW (ref 4.10–5.70)
RDW: 16.1 % — ABNORMAL HIGH (ref 11.5–14.5)
WBC: 10.7 10*3/uL (ref 4.1–11.1)

## 2019-12-28 LAB — CBC W/O DIFF
ABSOLUTE NRBC: 0 10*3/uL (ref 0.00–0.01)
HCT: 23.8 % — ABNORMAL LOW (ref 36.6–50.3)
HGB: 8.1 g/dL — ABNORMAL LOW (ref 12.1–17.0)
MCH: 31 PG (ref 26.0–34.0)
MCHC: 34 g/dL (ref 30.0–36.5)
MCV: 91.2 FL (ref 80.0–99.0)
MPV: 12 FL (ref 8.9–12.9)
NRBC: 0 PER 100 WBC
PLATELET: 62 10*3/uL — ABNORMAL LOW (ref 150–400)
RBC: 2.61 M/uL — ABNORMAL LOW (ref 4.10–5.70)
RDW: 16.2 % — ABNORMAL HIGH (ref 11.5–14.5)
WBC: 13.6 10*3/uL — ABNORMAL HIGH (ref 4.1–11.1)

## 2019-12-28 LAB — METABOLIC PANEL, BASIC
Anion gap: 11 mmol/L (ref 5–15)
Anion gap: 11 mmol/L (ref 5–15)
BUN/Creatinine ratio: 26 — ABNORMAL HIGH (ref 12–20)
BUN/Creatinine ratio: 26 — ABNORMAL HIGH (ref 12–20)
BUN: 106 mg/dL — ABNORMAL HIGH (ref 6–20)
BUN: 103 mg/dL — ABNORMAL HIGH (ref 6–20)
CO2: 19 mmol/L — ABNORMAL LOW (ref 21–32)
CO2: 18 mmol/L — ABNORMAL LOW (ref 21–32)
Calcium: 7.8 mg/dL — ABNORMAL LOW (ref 8.5–10.1)
Calcium: 8.1 mg/dL — ABNORMAL LOW (ref 8.5–10.1)
Chloride: 107 mmol/L (ref 97–108)
Chloride: 108 mmol/L (ref 97–108)
Creatinine: 4.04 mg/dL — ABNORMAL HIGH (ref 0.70–1.30)
Creatinine: 3.92 mg/dL — ABNORMAL HIGH (ref 0.70–1.30)
GFR est AA: 18 mL/min/{1.73_m2} — ABNORMAL LOW (ref >60)
GFR est AA: 19 mL/min/{1.73_m2} — ABNORMAL LOW (ref >60)
GFR est non-AA: 15 mL/min/{1.73_m2} — ABNORMAL LOW (ref >60)
GFR est non-AA: 16 mL/min/{1.73_m2} — ABNORMAL LOW (ref >60)
Glucose: 84 mg/dL (ref 65–100)
Glucose: 77 mg/dL (ref 65–100)
Potassium: 4.3 mmol/L (ref 3.5–5.1)
Potassium: 4.3 mmol/L (ref 3.5–5.1)
Sodium: 137 mmol/L (ref 136–145)
Sodium: 137 mmol/L (ref 136–145)

## 2019-12-28 LAB — URINALYSIS W/ REFLEX CULTURE
Bilirubin: NEGATIVE
Glucose: NEGATIVE mg/dL
Ketone: NEGATIVE mg/dL
Nitrites: NEGATIVE
Protein: NEGATIVE mg/dL
Specific gravity: 1.012 (ref 1.003–1.030)
Urobilinogen: 2 EU/dL — ABNORMAL HIGH (ref 0.1–1.0)
WBC: >100 /hpf — ABNORMAL HIGH (ref 0–4)
pH (UA): 6 (ref 5.0–8.0)

## 2019-12-28 LAB — LACTIC ACID
Lactic acid: <0.3 mmol/L — ABNORMAL LOW (ref 0.4–2.0)
Lactic acid: 0.3 mmol/L — ABNORMAL LOW (ref 0.4–2.0)

## 2019-12-28 LAB — C REACTIVE PROTEIN, QT: C-Reactive protein: 20.2 mg/dL — ABNORMAL HIGH (ref 0.00–0.60)

## 2019-12-28 LAB — HGB & HCT
HCT: 28.2 % — ABNORMAL LOW (ref 36.6–50.3)
HGB: 9.6 g/dL — ABNORMAL LOW (ref 12.1–17.0)

## 2019-12-28 MED ORDER — SODIUM CHLORIDE 0.9 % IV
INTRAVENOUS | Status: AC
Start: 2019-12-28 — End: 2019-12-28
  Administered 2019-12-28: 03:00:00 via INTRAVENOUS

## 2019-12-28 MED ORDER — ACETAMINOPHEN 650 MG RECTAL SUPPOSITORY
650 mg | Freq: Four times a day (QID) | RECTAL | Status: DC | PRN
Start: 2019-12-28 — End: 2020-01-02

## 2019-12-28 MED ORDER — DOLUTEGRAVIR 50 MG TABLET
50 mg | Freq: Every day | ORAL | Status: DC
Start: 2019-12-28 — End: 2020-01-02
  Administered 2019-12-28 – 2020-01-02 (×6): via ORAL

## 2019-12-28 MED ORDER — PHARMACY VANCOMYCIN NOTE
Status: DC | PRN
Start: 2019-12-28 — End: 2020-01-02

## 2019-12-28 MED ORDER — SODIUM CHLORIDE 0.9 % IJ SYRG
INTRAMUSCULAR | Status: DC | PRN
Start: 2019-12-28 — End: 2020-01-02

## 2019-12-28 MED ORDER — TAMSULOSIN SR 0.4 MG 24 HR CAP
0.4 mg | Freq: Every evening | ORAL | Status: DC
Start: 2019-12-28 — End: 2020-01-02
  Administered 2019-12-29 – 2020-01-02 (×5): via ORAL

## 2019-12-28 MED ORDER — DAPSONE 25 MG TAB
25 mg | Freq: Every day | ORAL | Status: DC
Start: 2019-12-28 — End: 2020-01-02
  Administered 2019-12-28 – 2020-01-02 (×5): via ORAL

## 2019-12-28 MED ORDER — ONDANSETRON 4 MG TAB, RAPID DISSOLVE
4 mg | Freq: Three times a day (TID) | ORAL | Status: DC | PRN
Start: 2019-12-28 — End: 2020-01-02

## 2019-12-28 MED ORDER — VANCOMYCIN 1,000 MG IV SOLR
1000 mg | INTRAVENOUS | Status: DC
Start: 2019-12-28 — End: 2019-12-28

## 2019-12-28 MED ORDER — VANCOMYCIN 750 MG IV SOLUTION
750 mg | Freq: Once | INTRAVENOUS | Status: AC
Start: 2019-12-28 — End: 2019-12-28
  Administered 2019-12-28: 17:00:00 via INTRAVENOUS

## 2019-12-28 MED ORDER — PHARMACY VANCOMYCIN NOTE
Freq: Once | Status: AC
Start: 2019-12-28 — End: 2019-12-29

## 2019-12-28 MED ORDER — SODIUM CHLORIDE 0.9 % INJECTION
40 mg | Freq: Once | INTRAMUSCULAR | Status: AC
Start: 2019-12-28 — End: 2019-12-27
  Administered 2019-12-28: 03:00:00 via INTRAVENOUS

## 2019-12-28 MED ORDER — METOPROLOL SUCCINATE SR 25 MG 24 HR TAB
25 mg | Freq: Every day | ORAL | Status: DC
Start: 2019-12-28 — End: 2019-12-29

## 2019-12-28 MED ORDER — SODIUM BICARBONATE 650 MG TAB
650 mg | Freq: Two times a day (BID) | ORAL | Status: DC
Start: 2019-12-28 — End: 2019-12-29
  Administered 2019-12-28 – 2019-12-29 (×3): via ORAL

## 2019-12-28 MED ORDER — POLYETHYLENE GLYCOL 3350 17 GRAM (100 %) ORAL POWDER PACKET
17 gram | Freq: Every day | ORAL | Status: DC | PRN
Start: 2019-12-28 — End: 2020-01-02

## 2019-12-28 MED ORDER — MEROPENEM 500 MG IV SOLR
500 mg | Freq: Two times a day (BID) | INTRAVENOUS | Status: DC
Start: 2019-12-28 — End: 2019-12-29
  Administered 2019-12-28 – 2019-12-29 (×2): via INTRAVENOUS

## 2019-12-28 MED ORDER — ONDANSETRON (PF) 4 MG/2 ML INJECTION
4 mg/2 mL | Freq: Four times a day (QID) | INTRAMUSCULAR | Status: DC | PRN
Start: 2019-12-28 — End: 2020-01-02

## 2019-12-28 MED ORDER — EMTRICITABINE 200 MG-TENOFOVIR ALAFENAMIDE FUMARATE 25 MG TABLET
200-25 mg | Freq: Every day | ORAL | Status: DC
Start: 2019-12-28 — End: 2020-01-02
  Administered 2019-12-28 – 2020-01-02 (×6): via ORAL

## 2019-12-28 MED ORDER — SODIUM CHLORIDE 0.9 % IJ SYRG
Freq: Three times a day (TID) | INTRAMUSCULAR | Status: DC
Start: 2019-12-28 — End: 2019-12-30
  Administered 2019-12-28 – 2019-12-30 (×8): via INTRAVENOUS

## 2019-12-28 MED ORDER — ACETAMINOPHEN 325 MG TABLET
325 mg | Freq: Four times a day (QID) | ORAL | Status: DC | PRN
Start: 2019-12-28 — End: 2020-01-02

## 2019-12-28 MED ORDER — PANTOPRAZOLE 40 MG IV SOLR
40 mg | INTRAVENOUS | Status: DC
Start: 2019-12-28 — End: 2019-12-29
  Administered 2019-12-28 – 2019-12-29 (×3): via INTRAVENOUS

## 2019-12-28 MED ORDER — MUPIROCIN 2 % OINTMENT
2 % | Freq: Two times a day (BID) | CUTANEOUS | Status: AC
Start: 2019-12-28 — End: 2020-01-01
  Administered 2019-12-28 – 2020-01-02 (×10): via TOPICAL

## 2019-12-28 MED FILL — PROTONIX 40 MG INTRAVENOUS SOLUTION: 40 mg | INTRAVENOUS | Qty: 40

## 2019-12-28 MED FILL — MUPIROCIN 2 % OINTMENT: 2 % | CUTANEOUS | Qty: 1

## 2019-12-28 MED FILL — SODIUM CHLORIDE 0.9 % IJ SYRG: INTRAMUSCULAR | Qty: 40

## 2019-12-28 MED FILL — SODIUM CHLORIDE 0.9 % IV: INTRAVENOUS | Qty: 1000

## 2019-12-28 MED FILL — DAPSONE 25 MG TAB: 25 mg | ORAL | Qty: 4

## 2019-12-28 MED FILL — SODIUM BICARBONATE 650 MG TAB: 650 mg | ORAL | Qty: 1

## 2019-12-28 MED FILL — PHARMACY VANCOMYCIN NOTE: Qty: 1

## 2019-12-28 MED FILL — DESCOVY 200 MG-25 MG TABLET: 200-25 mg | ORAL | Qty: 1

## 2019-12-28 MED FILL — VANCOMYCIN 750 MG IV SOLUTION: 750 mg | INTRAVENOUS | Qty: 750

## 2019-12-28 MED FILL — TIVICAY 50 MG TABLET: 50 mg | ORAL | Qty: 1

## 2019-12-28 MED FILL — MEROPENEM 500 MG IV SOLR: 500 mg | INTRAVENOUS | Qty: 500

## 2019-12-28 MED FILL — METOPROLOL SUCCINATE SR 25 MG 24 HR TAB: 25 mg | ORAL | Qty: 1

## 2019-12-29 ENCOUNTER — Encounter: Primary: Internal Medicine

## 2019-12-29 ENCOUNTER — Inpatient Hospital Stay: Admit: 2019-12-29 | Payer: MEDICARE | Primary: Internal Medicine

## 2019-12-29 LAB — CBC WITH AUTOMATED DIFF
ABS. BASOPHILS: 0 10*3/uL (ref 0.0–0.1)
ABS. EOSINOPHILS: 0 10*3/uL (ref 0.0–0.4)
ABS. IMM. GRANS.: 0 10*3/uL
ABS. LYMPHOCYTES: 0.2 10*3/uL — ABNORMAL LOW (ref 0.8–3.5)
ABS. MONOCYTES: 0.2 10*3/uL (ref 0.0–1.0)
ABS. NEUTROPHILS: 11.8 10*3/uL — ABNORMAL HIGH (ref 1.8–8.0)
ABSOLUTE NRBC: 0 10*3/uL (ref 0.00–0.01)
BASOPHILS: 0 % (ref 0–1)
EOSINOPHILS: 0 % (ref 0–7)
HCT: 22.8 % — ABNORMAL LOW (ref 36.6–50.3)
HGB: 7.5 g/dL — ABNORMAL LOW (ref 12.1–17.0)
IMMATURE GRANULOCYTES: 0 %
LYMPHOCYTES: 2 % — ABNORMAL LOW (ref 12–49)
MCH: 30.2 PG (ref 26.0–34.0)
MCHC: 32.9 g/dL (ref 30.0–36.5)
MCV: 91.9 FL (ref 80.0–99.0)
MONOCYTES: 2 % — ABNORMAL LOW (ref 5–13)
MPV: 12.3 FL (ref 8.9–12.9)
NEUTROPHILS: 96 % — ABNORMAL HIGH (ref 32–75)
NRBC: 0 PER 100 WBC
PLATELET: 58 10*3/uL — ABNORMAL LOW (ref 150–400)
RBC: 2.48 M/uL — ABNORMAL LOW (ref 4.10–5.70)
RDW: 16.6 % — ABNORMAL HIGH (ref 11.5–14.5)
WBC: 12.2 10*3/uL — ABNORMAL HIGH (ref 4.1–11.1)

## 2019-12-29 LAB — ECHO ADULT COMPLETE
AV Area by Peak Velocity: 2.1 cm2
AV Area by VTI: 2.8 cm2
AV Mean Gradient: 3 mmHg
AV Mean Velocity: 78.1 cm/s
AV Peak Gradient: 7 mmHg
AV Peak Velocity: 128 cm/s
AV VTI: 16.2 cm
AVA/BSA Peak Velocity: 1.4 cm2/m2
AVA/BSA VTI: 1.9 cm2/m2
Aortic Root: 2.8 cm
Ascending Aorta: 2.6 cm
E/E' Septal: 23.18
Est. RA Pressure: 3 mmHg
IVSd: 1.18 cm — AB (ref 0.60–1.00)
LA Area 2C: 13.3 cm2
LA Area 4C: 34.4 cm2
LA Major Axis: 3.3 cm
LA Minor Axis: 2.19 cm
LA Volume DISK BP: 140 cm3 (ref 18.0–58.0)
LV E' Septal Velocity: 4 cm/s
LV EDV A2C: 33 cm3
LV EDV A4C: 58 cm3
LV ESV A2C: 18.2 cm3
LV ESV A4C: 24 cm3
LV Mass 2D Index: 84.4 g/m2 (ref 49–115)
LV Mass 2D: 127.4 g (ref 88–224)
LVIDd: 3.73 cm — AB (ref 4.20–5.90)
LVIDs: 2.63 cm
LVOT Diameter: 2.1 cm
LVOT Peak Gradient: 2 mmHg
LVOT Peak Velocity: 77.7 cm/s
LVOT SV: 45 cm3
LVOT VTI: 13.1 cm
LVPWd: 0.98 cm (ref 0.60–1.00)
Left Ventricular Outflow Tract Mean Gradient: 1 mmHg
MR VTI: 58.4 cm
MV A Velocity: 37.2 cm/s
MV Area by PHT: 4.4 cm2
MV E Velocity: 92.7 cm/s
MV E/A: 2.49
MV PHT: 50 ms
Mitral Valve Deceleration Slope: 5520 mm/s2
Mitral Valve Deceleration Slope: 5520 mm/s2
PV Max Velocity: 92.6 cm/s
PV Mean Gradient: 1 mmHg
PV Systolic Peak Instantaneous Gradient: 3 mmHg
PV VTI: 11.1 cm
Pulm Vein Peak D Velocity: 52.8 cm/s
Pulm Vein Peak S Velocity: 73.6 cm/s
RA Area 4C: 20.3 cm2
RVSP: 43 mmHg
TR Peak Gradient: 40 mmHg
TV MG: 23 mmHg
TV Max Velocity: 315 cm/s

## 2019-12-29 LAB — VANCOMYCIN, RANDOM: Vancomycin, random: 12.2 ug/mL

## 2019-12-29 LAB — METABOLIC PANEL, BASIC
Anion gap: 14 mmol/L (ref 5–15)
BUN/Creatinine ratio: 27 — ABNORMAL HIGH (ref 12–20)
BUN: 99 mg/dL — ABNORMAL HIGH (ref 6–20)
CO2: 12 mmol/L — CL (ref 21–32)
Calcium: 8 mg/dL — ABNORMAL LOW (ref 8.5–10.1)
Chloride: 111 mmol/L — ABNORMAL HIGH (ref 97–108)
Creatinine: 3.61 mg/dL — ABNORMAL HIGH (ref 0.70–1.30)
GFR est AA: 21 mL/min/{1.73_m2} — ABNORMAL LOW (ref >60)
GFR est non-AA: 17 mL/min/{1.73_m2} — ABNORMAL LOW (ref >60)
Glucose: 62 mg/dL — ABNORMAL LOW (ref 65–100)
Potassium: 4.6 mmol/L (ref 3.5–5.1)
Sodium: 137 mmol/L (ref 136–145)

## 2019-12-29 LAB — C REACTIVE PROTEIN, QT: C-Reactive protein: 18.9 mg/dL — ABNORMAL HIGH (ref 0.00–0.60)

## 2019-12-29 LAB — PROCALCITONIN: Procalcitonin: 50.14 ng/mL — ABNORMAL HIGH

## 2019-12-29 MED ORDER — SODIUM CHLORIDE 0.9 % IV
INTRAVENOUS | Status: DC | PRN
Start: 2019-12-29 — End: 2019-12-29
  Administered 2019-12-29: 19:00:00 via INTRAVENOUS

## 2019-12-29 MED ORDER — DEXTROSE 5% IN WATER (D5W) IV
1 mEq/mL (8.4 %) | INTRAVENOUS | Status: DC
Start: 2019-12-29 — End: 2019-12-30
  Administered 2019-12-29 – 2019-12-30 (×2): via INTRAVENOUS

## 2019-12-29 MED ORDER — SODIUM BICARBONATE 8.4 % (1 MEQ/ML) IV SYRG
8.4 % (1 mEq/mL) | INTRAVENOUS | Status: AC
Start: 2019-12-29 — End: 2019-12-29
  Administered 2019-12-29: 15:00:00 via INTRAVENOUS

## 2019-12-29 MED ORDER — SODIUM CHLORIDE 0.9 % IV PIGGY BACK
500 mg | Freq: Once | INTRAVENOUS | Status: AC
Start: 2019-12-29 — End: 2019-12-29
  Administered 2019-12-29: 21:00:00 via INTRAVENOUS

## 2019-12-29 MED ORDER — SODIUM CHLORIDE 0.9 % INJECTION
10 mg/mL | INTRAMUSCULAR | Status: DC | PRN
Start: 2019-12-29 — End: 2019-12-29
  Administered 2019-12-29 (×2): via INTRAVENOUS

## 2019-12-29 MED ORDER — SODIUM CHLORIDE 0.9 % IV
INTRAVENOUS | Status: DC
Start: 2019-12-29 — End: 2019-12-29
  Administered 2019-12-29: 14:00:00 via INTRAVENOUS

## 2019-12-29 MED ORDER — PHARMACY VANCOMYCIN NOTE
Freq: Once | Status: AC
Start: 2019-12-29 — End: 2019-12-30
  Administered 2019-12-30: 08:00:00

## 2019-12-29 MED ORDER — KETAMINE 10 MG/ML IJ SOLN
10 mg/mL | INTRAMUSCULAR | Status: DC | PRN
Start: 2019-12-29 — End: 2019-12-29
  Administered 2019-12-29: 19:00:00 via INTRAVENOUS

## 2019-12-29 MED ORDER — SODIUM CHLORIDE 0.9 % IV
INTRAVENOUS | Status: DC | PRN
Start: 2019-12-29 — End: 2020-01-02
  Administered 2019-12-31: 02:00:00 via INTRAVENOUS

## 2019-12-29 MED ORDER — KETAMINE 10 MG/ML IJ SOLN
10 mg/mL | INTRAMUSCULAR | Status: AC
Start: 2019-12-29 — End: ?

## 2019-12-29 MED ORDER — SUCRALFATE 1 GRAM TAB
1 gram | Freq: Four times a day (QID) | ORAL | Status: DC
Start: 2019-12-29 — End: 2020-01-02
  Administered 2019-12-30 – 2020-01-02 (×16): via ORAL

## 2019-12-29 MED ORDER — NOREPINEPHRINE 8 MG/250 ML (32 MCG/ML) D5W INFUSION
8 mg/250 mL (32 mcg/mL) | INTRAVENOUS | Status: DC
Start: 2019-12-29 — End: 2019-12-30
  Administered 2019-12-29 – 2019-12-30 (×7): via INTRAVENOUS

## 2019-12-29 MED ORDER — PANTOPRAZOLE 40 MG TAB, DELAYED RELEASE
40 mg | Freq: Every day | ORAL | Status: DC
Start: 2019-12-29 — End: 2020-01-02
  Administered 2019-12-30 – 2020-01-02 (×4): via ORAL

## 2019-12-29 MED ORDER — SODIUM CHLORIDE 0.9 % IV
20 unit/mL | INTRAVENOUS | Status: DC | PRN
Start: 2019-12-29 — End: 2019-12-29
  Administered 2019-12-29: 19:00:00 via INTRAVENOUS

## 2019-12-29 MED ORDER — SODIUM CHLORIDE 0.9% BOLUS IV
0.9 % | Freq: Once | INTRAVENOUS | Status: AC
Start: 2019-12-29 — End: 2019-12-29

## 2019-12-29 MED ORDER — ALBUMIN, HUMAN 5 % IV
5 % | INTRAVENOUS | Status: DC | PRN
Start: 2019-12-29 — End: 2019-12-29
  Administered 2019-12-29 (×2): via INTRAVENOUS

## 2019-12-29 MED ORDER — SODIUM CHLORIDE 0.9% BOLUS IV
0.9 % | Freq: Once | INTRAVENOUS | Status: AC
Start: 2019-12-29 — End: 2019-12-29
  Administered 2019-12-29: 12:00:00 via INTRAVENOUS

## 2019-12-29 MED ORDER — PROPOFOL 10 MG/ML IV EMUL
10 mg/mL | INTRAVENOUS | Status: AC
Start: 2019-12-29 — End: ?

## 2019-12-29 MED ORDER — SODIUM CHLORIDE 0.9 % IV
INTRAVENOUS | Status: DC | PRN
Start: 2019-12-29 — End: 2019-12-30

## 2019-12-29 MED ORDER — PROPOFOL 10 MG/ML IV EMUL
10 mg/mL | INTRAVENOUS | Status: DC | PRN
Start: 2019-12-29 — End: 2019-12-29
  Administered 2019-12-29: 19:00:00 via INTRAVENOUS

## 2019-12-29 MED ORDER — ALBUMIN, HUMAN 5 % IV
5 % | INTRAVENOUS | Status: AC
Start: 2019-12-29 — End: ?

## 2019-12-29 MED FILL — TIVICAY 50 MG TABLET: 50 mg | ORAL | Qty: 1

## 2019-12-29 MED FILL — TAMSULOSIN SR 0.4 MG 24 HR CAP: 0.4 mg | ORAL | Qty: 1

## 2019-12-29 MED FILL — DEXTROSE 5% IN WATER (D5W) IV: INTRAVENOUS | Qty: 1000

## 2019-12-29 MED FILL — SODIUM CHLORIDE 0.9 % IV: INTRAVENOUS | Qty: 250

## 2019-12-29 MED FILL — SODIUM BICARBONATE 650 MG TAB: 650 mg | ORAL | Qty: 1

## 2019-12-29 MED FILL — PROTONIX 40 MG INTRAVENOUS SOLUTION: 40 mg | INTRAVENOUS | Qty: 40

## 2019-12-29 MED FILL — DAPSONE 25 MG TAB: 25 mg | ORAL | Qty: 4

## 2019-12-29 MED FILL — SODIUM CHLORIDE 0.9 % IV: INTRAVENOUS | Qty: 1000

## 2019-12-29 MED FILL — MUPIROCIN 2 % OINTMENT: 2 % | CUTANEOUS | Qty: 1

## 2019-12-29 MED FILL — DESCOVY 200 MG-25 MG TABLET: 200-25 mg | ORAL | Qty: 1

## 2019-12-29 MED FILL — NOREPINEPHRINE 8 MG/250 ML (32 MCG/ML) D5W INFUSION: 8 mg/250 mL (32 mcg/mL) | INTRAVENOUS | Qty: 250

## 2019-12-29 MED FILL — MEROPENEM 500 MG IV SOLR: 500 mg | INTRAVENOUS | Qty: 500

## 2019-12-29 MED FILL — PROPOFOL 10 MG/ML IV EMUL: 10 mg/mL | INTRAVENOUS | Qty: 20

## 2019-12-29 MED FILL — PHARMACY VANCOMYCIN NOTE: Qty: 1

## 2019-12-29 MED FILL — SODIUM BICARBONATE 8.4 % (1 MEQ/ML) IV SYRG: 8.4 % (1 mEq/mL) | INTRAVENOUS | Qty: 50

## 2019-12-29 MED FILL — ALBUTEIN 5 % INTRAVENOUS SOLUTION: 5 % | INTRAVENOUS | Qty: 250

## 2019-12-29 MED FILL — VANCOMYCIN 500 MG IV SOLR: 500 mg | INTRAVENOUS | Qty: 500

## 2019-12-29 MED FILL — SODIUM CHLORIDE 0.9 % IV: INTRAVENOUS | Qty: 500

## 2019-12-29 MED FILL — KETALAR 10 MG/ML INJECTION SOLUTION: 10 mg/mL | INTRAMUSCULAR | Qty: 20

## 2019-12-30 LAB — CULTURE, URINE: Colony Count: >100000

## 2019-12-30 LAB — LYMPHOCYTES, CD4 PERCENT AND ABSOLUTE
% CD 4 Pos Lymph: 20.7 % — ABNORMAL LOW (ref 30.8–58.5)
ABS. BASOPHILS: 0 10*3/uL (ref 0.0–0.2)
ABS. EOSINOPHILS: 0.1 10*3/uL (ref 0.0–0.4)
ABS. IMM. GRANS.: 0.2 10*3/uL — ABNORMAL HIGH (ref 0.0–0.1)
ABS. MONOCYTES: 0.3 10*3/uL (ref 0.1–0.9)
ABS. NEUTROPHILS: 12.1 10*3/uL — ABNORMAL HIGH (ref 1.4–7.0)
Abs CD4 Helper: 62 /uL — ABNORMAL LOW (ref 359–1519)
Abs Lymphocytes: 0.3 10*3/uL — ABNORMAL LOW (ref 0.7–3.1)
BASOPHILS: 0 %
EOSINOPHILS: 0 %
HCT: 21.3 % — ABNORMAL LOW (ref 37.5–51.0)
HGB: 7.6 g/dL — ABNORMAL LOW (ref 13.0–17.7)
IMMATURE GRANULOCYTES: 1 %
Lymphocytes: 3 %
MCH: 30.5 pg (ref 26.6–33.0)
MCHC: 35.7 g/dL (ref 31.5–35.7)
MCV: 86 fL (ref 79–97)
MONOCYTES: 3 %
NEUTROPHILS: 93 %
PLATELET: 61 10*3/uL — ABNORMAL LOW (ref 150–450)
RBC: 2.49 x10E6/uL — ABNORMAL LOW (ref 4.14–5.80)
RDW: 15.2 % (ref 11.6–15.4)
WBC: 13 10*3/uL — ABNORMAL HIGH (ref 3.4–10.8)

## 2019-12-30 LAB — METABOLIC PANEL, BASIC
Anion gap: 6 mmol/L (ref 5–15)
BUN/Creatinine ratio: 30 — ABNORMAL HIGH (ref 12–20)
BUN: 75 mg/dL — ABNORMAL HIGH (ref 6–20)
CO2: 32 mmol/L (ref 21–32)
Calcium: 6.6 mg/dL — ABNORMAL LOW (ref 8.5–10.1)
Chloride: 101 mmol/L (ref 97–108)
Creatinine: 2.46 mg/dL — ABNORMAL HIGH (ref 0.70–1.30)
GFR est AA: 32 mL/min/{1.73_m2} — ABNORMAL LOW (ref >60)
GFR est non-AA: 27 mL/min/{1.73_m2} — ABNORMAL LOW (ref >60)
Glucose: 562 mg/dL — ABNORMAL HIGH (ref 65–100)
Potassium: 2.8 mmol/L — ABNORMAL LOW (ref 3.5–5.1)
Sodium: 139 mmol/L (ref 136–145)

## 2019-12-30 LAB — IRON PROFILE
Iron % saturation: 63 % — ABNORMAL HIGH (ref 20–50)
Iron: 43 ug/dL (ref 35–150)
TIBC: 68 ug/dL — ABNORMAL LOW (ref 250–450)

## 2019-12-30 LAB — CBC WITH AUTOMATED DIFF
ABS. BASOPHILS: 0 10*3/uL (ref 0.0–0.1)
ABS. EOSINOPHILS: 0.1 10*3/uL (ref 0.0–0.4)
ABS. LYMPHOCYTES: 0.3 10*3/uL — ABNORMAL LOW (ref 0.8–3.5)
ABS. MONOCYTES: 0.5 10*3/uL (ref 0.0–1.0)
ABS. NEUTROPHILS: 8.7 10*3/uL — ABNORMAL HIGH (ref 1.8–8.0)
ABSOLUTE NRBC: 0 10*3/uL (ref 0.00–0.01)
BASOPHILS: 0 % (ref 0–1)
EOSINOPHILS: 1 % (ref 0–7)
HCT: 24.3 % — ABNORMAL LOW (ref 36.6–50.3)
HGB: 8.7 g/dL — ABNORMAL LOW (ref 12.1–17.0)
LYMPHOCYTES: 4 % — ABNORMAL LOW (ref 12–49)
MCH: 31.1 PG (ref 26.0–34.0)
MCHC: 35.8 g/dL (ref 30.0–36.5)
MCV: 86.8 FL (ref 80.0–99.0)
MONOCYTES: 5 % (ref 5–13)
MPV: 11.9 FL (ref 8.9–12.9)
NEUTROPHILS: 90 % — ABNORMAL HIGH (ref 32–75)
NRBC: 0 PER 100 WBC
PLATELET: 44 10*3/uL — CL (ref 150–400)
RBC: 2.8 M/uL — ABNORMAL LOW (ref 4.10–5.70)
RDW: 15 % — ABNORMAL HIGH (ref 11.5–14.5)
WBC: 9.8 10*3/uL (ref 4.1–11.1)

## 2019-12-30 LAB — TYPE & SCREEN
ABO/Rh(D): A POS
Antibody screen: NEGATIVE
Unit division: 0
Unit division: 0
Unit division: 0

## 2019-12-30 LAB — RENAL FUNCTION PANEL
Albumin: 1.6 g/dL — ABNORMAL LOW (ref 3.5–5.0)
Anion gap: 10 mmol/L (ref 5–15)
BUN/Creatinine ratio: 31 — ABNORMAL HIGH (ref 12–20)
BUN: 79 mg/dL — ABNORMAL HIGH (ref 6–20)
CO2: 27 mmol/L (ref 21–32)
Calcium: 7.1 mg/dL — ABNORMAL LOW (ref 8.5–10.1)
Chloride: 105 mmol/L (ref 97–108)
Creatinine: 2.59 mg/dL — ABNORMAL HIGH (ref 0.70–1.30)
GFR est AA: 30 mL/min/{1.73_m2} — ABNORMAL LOW (ref >60)
GFR est non-AA: 25 mL/min/{1.73_m2} — ABNORMAL LOW (ref >60)
Glucose: 385 mg/dL — ABNORMAL HIGH (ref 65–100)
Phosphorus: 3.8 mg/dL (ref 2.6–4.7)
Potassium: 3 mmol/L — ABNORMAL LOW (ref 3.5–5.1)
Sodium: 142 mmol/L (ref 136–145)

## 2019-12-30 LAB — VANCOMYCIN, TROUGH: Vancomycin,trough: 16.1 ug/mL — ABNORMAL HIGH (ref 5.0–10.0)

## 2019-12-30 LAB — PTH INTACT
Calcium: 6.8 mg/dL — ABNORMAL LOW (ref 8.5–10.1)
PTH, Intact: 74.6 pg/mL (ref 18.4–88.0)

## 2019-12-30 LAB — CK: CK: 18 U/L — ABNORMAL LOW (ref 39–308)

## 2019-12-30 LAB — HIV-1 RNA BY PCR, QT
HIV-1 RNA by PCR: 20 copies/mL
HIV-1 RNA log10 Copies/mL: 1.301 log10copy/mL

## 2019-12-30 LAB — FERRITIN: Ferritin: 4841 ng/mL — ABNORMAL HIGH (ref 26–388)

## 2019-12-30 LAB — RPR: RPR: REACTIVE — AB

## 2019-12-30 LAB — VITAMIN D, 25 HYDROXY: Vitamin D 25-Hydroxy: 58.6 ng/mL (ref 30–100)

## 2019-12-30 LAB — PROCALCITONIN: Procalcitonin: 31.07 ng/mL — ABNORMAL HIGH

## 2019-12-30 LAB — GLUCOSE, POC: Glucose (POC): 213 mg/dL — ABNORMAL HIGH (ref 65–117)

## 2019-12-30 LAB — C REACTIVE PROTEIN, QT: C-Reactive protein: 13 mg/dL — ABNORMAL HIGH (ref 0.00–0.60)

## 2019-12-30 MED ORDER — 1/2 NS WITH POTASSIUM CHLORIDE 20 MEQ/L IV
20 mEq/L | INTRAVENOUS | Status: DC
Start: 2019-12-30 — End: 2020-01-02
  Administered 2019-12-30 – 2020-01-02 (×7): via INTRAVENOUS

## 2019-12-30 MED ORDER — POTASSIUM CHLORIDE 10 MEQ/100 ML IV PIGGY BACK
10 mEq/0 mL | INTRAVENOUS | Status: AC
Start: 2019-12-30 — End: 2019-12-30
  Administered 2019-12-30 (×5): via INTRAVENOUS

## 2019-12-30 MED ORDER — POTASSIUM CHLORIDE SR 20 MEQ TAB, PARTICLES/CRYSTALS
20 mEq | ORAL | Status: AC
Start: 2019-12-30 — End: 2019-12-30
  Administered 2019-12-30: 16:00:00 via ORAL

## 2019-12-30 MED ORDER — SODIUM CHLORIDE 0.9 % IV PIGGY BACK
500 mg | Freq: Once | INTRAVENOUS | Status: AC
Start: 2019-12-30 — End: 2019-12-30
  Administered 2019-12-30: 20:00:00 via INTRAVENOUS

## 2019-12-30 MED FILL — SUCRALFATE 1 GRAM TAB: 1 gram | ORAL | Qty: 1

## 2019-12-30 MED FILL — DEXTROSE 5% IN WATER (D5W) IV: INTRAVENOUS | Qty: 1000

## 2019-12-30 MED FILL — NOREPINEPHRINE 8 MG/250 ML (32 MCG/ML) D5W INFUSION: 8 mg/250 mL (32 mcg/mL) | INTRAVENOUS | Qty: 250

## 2019-12-30 MED FILL — DESCOVY 200 MG-25 MG TABLET: 200-25 mg | ORAL | Qty: 1

## 2019-12-30 MED FILL — VANCOMYCIN 500 MG IV SOLR: 500 mg | INTRAVENOUS | Qty: 500

## 2019-12-30 MED FILL — TAMSULOSIN SR 0.4 MG 24 HR CAP: 0.4 mg | ORAL | Qty: 1

## 2019-12-30 MED FILL — TIVICAY 50 MG TABLET: 50 mg | ORAL | Qty: 1

## 2019-12-30 MED FILL — PANTOPRAZOLE 40 MG TAB, DELAYED RELEASE: 40 mg | ORAL | Qty: 1

## 2019-12-30 MED FILL — KLOR-CON M20 MEQ TABLET,EXTENDED RELEASE: 20 mEq | ORAL | Qty: 2

## 2019-12-30 MED FILL — 1/2 NS WITH POTASSIUM CHLORIDE 20 MEQ/L IV: 20 mEq/L | INTRAVENOUS | Qty: 1000

## 2019-12-30 MED FILL — POTASSIUM CHLORIDE 10 MEQ/100 ML IV PIGGY BACK: 10 mEq/0 mL | INTRAVENOUS | Qty: 100

## 2019-12-30 MED FILL — MUPIROCIN 2 % OINTMENT: 2 % | CUTANEOUS | Qty: 1

## 2019-12-31 LAB — METABOLIC PANEL, BASIC
Anion gap: 5 mmol/L (ref 5–15)
Anion gap: 6 mmol/L (ref 5–15)
BUN/Creatinine ratio: 31 — ABNORMAL HIGH (ref 12–20)
BUN/Creatinine ratio: 32 — ABNORMAL HIGH (ref 12–20)
BUN: 62 mg/dL — ABNORMAL HIGH (ref 6–20)
BUN: 58 mg/dL — ABNORMAL HIGH (ref 6–20)
CO2: 25 mmol/L (ref 21–32)
CO2: 24 mmol/L (ref 21–32)
Calcium: 7.1 mg/dL — ABNORMAL LOW (ref 8.5–10.1)
Calcium: 7.7 mg/dL — ABNORMAL LOW (ref 8.5–10.1)
Chloride: 108 mmol/L (ref 97–108)
Chloride: 109 mmol/L — ABNORMAL HIGH (ref 97–108)
Creatinine: 1.97 mg/dL — ABNORMAL HIGH (ref 0.70–1.30)
Creatinine: 1.79 mg/dL — ABNORMAL HIGH (ref 0.70–1.30)
GFR est AA: 42 mL/min/{1.73_m2} — ABNORMAL LOW (ref >60)
GFR est AA: 47 mL/min/{1.73_m2} — ABNORMAL LOW (ref >60)
GFR est non-AA: 34 mL/min/{1.73_m2} — ABNORMAL LOW (ref >60)
GFR est non-AA: 38 mL/min/{1.73_m2} — ABNORMAL LOW (ref >60)
Glucose: 138 mg/dL — ABNORMAL HIGH (ref 65–100)
Glucose: 123 mg/dL — ABNORMAL HIGH (ref 65–100)
Potassium: 4.2 mmol/L (ref 3.5–5.1)
Potassium: 4.5 mmol/L (ref 3.5–5.1)
Sodium: 138 mmol/L (ref 136–145)
Sodium: 139 mmol/L (ref 136–145)

## 2019-12-31 LAB — PROTHROMBIN TIME + INR
INR: 1.3 — ABNORMAL HIGH (ref 0.9–1.1)
Prothrombin time: 16.1 s — ABNORMAL HIGH (ref 11.9–14.7)

## 2019-12-31 LAB — CULTURE, BLOOD

## 2019-12-31 LAB — CBC WITH AUTOMATED DIFF
ABS. BASOPHILS: 0 10*3/uL (ref 0.0–0.1)
ABS. EOSINOPHILS: 0 10*3/uL (ref 0.0–0.4)
ABS. IMM. GRANS.: 0 10*3/uL
ABS. LYMPHOCYTES: 0.7 10*3/uL — ABNORMAL LOW (ref 0.8–3.5)
ABS. MONOCYTES: 0.2 10*3/uL (ref 0.0–1.0)
ABS. NEUTROPHILS: 11 10*3/uL — ABNORMAL HIGH (ref 1.8–8.0)
ABSOLUTE NRBC: 0 10*3/uL (ref 0.00–0.01)
BASOPHILS: 0 % (ref 0–1)
EOSINOPHILS: 0 % (ref 0–7)
HCT: 26.9 % — ABNORMAL LOW (ref 36.6–50.3)
HGB: 9.4 g/dL — ABNORMAL LOW (ref 12.1–17.0)
IMMATURE GRANULOCYTES: 0 %
LYMPHOCYTES: 6 % — ABNORMAL LOW (ref 12–49)
MCH: 30.4 PG (ref 26.0–34.0)
MCHC: 34.9 g/dL (ref 30.0–36.5)
MCV: 87.1 FL (ref 80.0–99.0)
MONOCYTES: 2 % — ABNORMAL LOW (ref 5–13)
MPV: 12.9 FL (ref 8.9–12.9)
NEUTROPHILS: 92 % — ABNORMAL HIGH (ref 32–75)
NRBC: 0 PER 100 WBC
PLATELET: 38 10*3/uL — CL (ref 150–400)
RBC: 3.09 M/uL — ABNORMAL LOW (ref 4.10–5.70)
RDW: 15.3 % — ABNORMAL HIGH (ref 11.5–14.5)
WBC: 11.9 10*3/uL — ABNORMAL HIGH (ref 4.1–11.1)

## 2019-12-31 LAB — C REACTIVE PROTEIN, QT: C-Reactive protein: 11.8 mg/dL — ABNORMAL HIGH (ref 0.00–0.60)

## 2019-12-31 LAB — RENAL FUNCTION PANEL
Albumin: 1.3 g/dL — ABNORMAL LOW (ref 3.5–5.0)
Anion gap: 6 mmol/L (ref 5–15)
BUN/Creatinine ratio: 34 — ABNORMAL HIGH (ref 12–20)
BUN: 60 mg/dL — ABNORMAL HIGH (ref 6–20)
CO2: 24 mmol/L (ref 21–32)
Calcium: 7.6 mg/dL — ABNORMAL LOW (ref 8.5–10.1)
Chloride: 109 mmol/L — ABNORMAL HIGH (ref 97–108)
Creatinine: 1.76 mg/dL — ABNORMAL HIGH (ref 0.70–1.30)
GFR est AA: 47 mL/min/{1.73_m2} — ABNORMAL LOW (ref >60)
GFR est non-AA: 39 mL/min/{1.73_m2} — ABNORMAL LOW (ref >60)
Glucose: 123 mg/dL — ABNORMAL HIGH (ref 65–100)
Phosphorus: 2.1 mg/dL — ABNORMAL LOW (ref 2.6–4.7)
Potassium: 4.5 mmol/L (ref 3.5–5.1)
Sodium: 139 mmol/L (ref 136–145)

## 2019-12-31 LAB — CBC W/O DIFF
ABSOLUTE NRBC: 0 10*3/uL (ref 0.00–0.01)
HCT: 26.8 % — ABNORMAL LOW (ref 36.6–50.3)
HGB: 9.4 g/dL — ABNORMAL LOW (ref 12.1–17.0)
MCH: 30.1 PG (ref 26.0–34.0)
MCHC: 35.1 g/dL (ref 30.0–36.5)
MCV: 85.9 FL (ref 80.0–99.0)
MPV: 11.4 FL (ref 8.9–12.9)
NRBC: 0 PER 100 WBC
PLATELET: 35 10*3/uL — CL (ref 150–400)
RBC: 3.12 M/uL — ABNORMAL LOW (ref 4.10–5.70)
RDW: 15.4 % — ABNORMAL HIGH (ref 11.5–14.5)
WBC: 12.2 10*3/uL — ABNORMAL HIGH (ref 4.1–11.1)

## 2019-12-31 LAB — PTT: aPTT: 35.3 s — ABNORMAL HIGH (ref 21.2–34.1)

## 2019-12-31 LAB — VITAMIN B12 & FOLATE
Folate: 6.2 ng/mL (ref 5.0–21.0)
Vitamin B12: 1401 pg/mL — ABNORMAL HIGH (ref 193–986)

## 2019-12-31 LAB — EKG, 12 LEAD, INITIAL
Atrial Rate: 256 {beats}/min
Calculated R Axis: 5 degrees
Calculated T Axis: -67 degrees
Q-T Interval: 374 ms
QRS Duration: 86 ms
QTC Calculation (Bezet): 489 ms
Ventricular Rate: 103 {beats}/min

## 2019-12-31 LAB — MAGNESIUM
Magnesium: 1.5 mg/dL — ABNORMAL LOW (ref 1.6–2.4)
Magnesium: 1.6 mg/dL (ref 1.6–2.4)

## 2019-12-31 LAB — GENOSURE

## 2019-12-31 LAB — VANCOMYCIN, RANDOM: Vancomycin, random: 16.2 ug/mL

## 2019-12-31 LAB — GLUCOSE, POC: Glucose (POC): 224 mg/dL — ABNORMAL HIGH (ref 65–117)

## 2019-12-31 MED ORDER — PHARMACY VANCOMYCIN NOTE
Freq: Once | Status: DC
Start: 2019-12-31 — End: 2020-01-02

## 2019-12-31 MED ORDER — SODIUM CHLORIDE 0.9% BOLUS IV
0.9 % | Freq: Once | INTRAVENOUS | Status: AC
Start: 2019-12-31 — End: 2019-12-30
  Administered 2019-12-31: 01:00:00 via INTRAVENOUS

## 2019-12-31 MED ORDER — CALCIUM GLUCONATE 1 GRAM/50 ML IN SODIUM CHLORIDE, ISO-OSM IV SOLUTION
1 gram/50 mL | Freq: Once | INTRAVENOUS | Status: AC
Start: 2019-12-31 — End: 2019-12-31
  Administered 2019-12-31: 08:00:00 via INTRAVENOUS

## 2019-12-31 MED ORDER — MAGNESIUM SULFATE 2 GRAM/50 ML IVPB
2 gram/50 mL (4 %) | Freq: Once | INTRAVENOUS | Status: AC
Start: 2019-12-31 — End: 2019-12-31
  Administered 2019-12-31: 08:00:00 via INTRAVENOUS

## 2019-12-31 MED ORDER — AMIODARONE 50 MG/ML IV SOLN
50 mg/mL | INTRAVENOUS | Status: DC
Start: 2019-12-31 — End: 2020-01-02
  Administered 2019-12-31 – 2020-01-01 (×5): via INTRAVENOUS

## 2019-12-31 MED ORDER — MAGNESIUM SULFATE 2 GRAM/50 ML IVPB
2 gram/50 mL (4 %) | Freq: Once | INTRAVENOUS | Status: AC
Start: 2019-12-31 — End: 2019-12-31
  Administered 2019-12-31: 17:00:00 via INTRAVENOUS

## 2019-12-31 MED ORDER — SODIUM CHLORIDE 0.9 % IV PIGGY BACK
500 mg | INTRAVENOUS | Status: DC
Start: 2019-12-31 — End: 2020-01-02
  Administered 2019-12-31 – 2020-01-01 (×2): via INTRAVENOUS

## 2019-12-31 MED FILL — PANTOPRAZOLE 40 MG TAB, DELAYED RELEASE: 40 mg | ORAL | Qty: 1

## 2019-12-31 MED FILL — 1/2 NS WITH POTASSIUM CHLORIDE 20 MEQ/L IV: 20 mEq/L | INTRAVENOUS | Qty: 1000

## 2019-12-31 MED FILL — SUCRALFATE 1 GRAM TAB: 1 gram | ORAL | Qty: 1

## 2019-12-31 MED FILL — MAGNESIUM SULFATE 2 GRAM/50 ML IVPB: 2 gram/50 mL (4 %) | INTRAVENOUS | Qty: 50

## 2019-12-31 MED FILL — AMIODARONE 50 MG/ML IV SOLN: 50 mg/mL | INTRAVENOUS | Qty: 7.5

## 2019-12-31 MED FILL — TIVICAY 50 MG TABLET: 50 mg | ORAL | Qty: 1

## 2019-12-31 MED FILL — PHARMACY VANCOMYCIN NOTE: Qty: 1

## 2019-12-31 MED FILL — VANCOMYCIN 500 MG IV SOLR: 500 mg | INTRAVENOUS | Qty: 500

## 2019-12-31 MED FILL — MUPIROCIN 2 % OINTMENT: 2 % | CUTANEOUS | Qty: 1

## 2019-12-31 MED FILL — TAMSULOSIN SR 0.4 MG 24 HR CAP: 0.4 mg | ORAL | Qty: 1

## 2019-12-31 MED FILL — DAPSONE 25 MG TAB: 25 mg | ORAL | Qty: 4

## 2019-12-31 MED FILL — DESCOVY 200 MG-25 MG TABLET: 200-25 mg | ORAL | Qty: 1

## 2019-12-31 MED FILL — SODIUM CHLORIDE 0.9 % IV: INTRAVENOUS | Qty: 500

## 2019-12-31 MED FILL — CALCIUM GLUCONATE 1 GRAM/50 ML IN SODIUM CHLORIDE, ISO-OSM IV SOLUTION: 1 gram/50 mL | INTRAVENOUS | Qty: 50

## 2020-01-01 LAB — CBC WITH AUTOMATED DIFF
ABS. BASOPHILS: 0 10*3/uL (ref 0.0–0.1)
ABS. EOSINOPHILS: 0 10*3/uL (ref 0.0–0.4)
ABS. IMM. GRANS.: 0 10*3/uL
ABS. LYMPHOCYTES: 0.3 10*3/uL — ABNORMAL LOW (ref 0.8–3.5)
ABS. MONOCYTES: 0.3 10*3/uL (ref 0.0–1.0)
ABS. NEUTROPHILS: 13.1 10*3/uL — ABNORMAL HIGH (ref 1.8–8.0)
ABSOLUTE NRBC: 0 10*3/uL (ref 0.00–0.01)
BASOPHILS: 0 % (ref 0–1)
EOSINOPHILS: 0 % (ref 0–7)
HCT: 25.9 % — ABNORMAL LOW (ref 36.6–50.3)
HGB: 8.9 g/dL — ABNORMAL LOW (ref 12.1–17.0)
IMMATURE GRANULOCYTES: 0 %
LYMPHOCYTES: 2 % — ABNORMAL LOW (ref 12–49)
MCH: 30.4 PG (ref 26.0–34.0)
MCHC: 34.4 g/dL (ref 30.0–36.5)
MCV: 88.4 FL (ref 80.0–99.0)
MONOCYTES: 2 % — ABNORMAL LOW (ref 5–13)
MPV: 13.1 FL — ABNORMAL HIGH (ref 8.9–12.9)
NEUTROPHILS: 96 % — ABNORMAL HIGH (ref 32–75)
NRBC: 0 PER 100 WBC
PLATELET: 39 10*3/uL — CL (ref 150–400)
RBC: 2.93 M/uL — ABNORMAL LOW (ref 4.10–5.70)
RDW: 15.5 % — ABNORMAL HIGH (ref 11.5–14.5)
WBC: 13.7 10*3/uL — ABNORMAL HIGH (ref 4.1–11.1)

## 2020-01-01 LAB — RENAL FUNCTION PANEL
Albumin: 1.2 g/dL — ABNORMAL LOW (ref 3.5–5.0)
Anion gap: 5 mmol/L (ref 5–15)
BUN/Creatinine ratio: 33 — ABNORMAL HIGH (ref 12–20)
BUN: 52 mg/dL — ABNORMAL HIGH (ref 6–20)
CO2: 23 mmol/L (ref 21–32)
Calcium: 7.4 mg/dL — ABNORMAL LOW (ref 8.5–10.1)
Chloride: 108 mmol/L (ref 97–108)
Creatinine: 1.57 mg/dL — ABNORMAL HIGH (ref 0.70–1.30)
GFR est AA: 54 mL/min/{1.73_m2} — ABNORMAL LOW (ref >60)
GFR est non-AA: 45 mL/min/{1.73_m2} — ABNORMAL LOW (ref >60)
Glucose: 157 mg/dL — ABNORMAL HIGH (ref 65–100)
Phosphorus: 2.3 mg/dL — ABNORMAL LOW (ref 2.6–4.7)
Potassium: 4.8 mmol/L (ref 3.5–5.1)
Sodium: 136 mmol/L (ref 136–145)

## 2020-01-01 LAB — CULTURE, BLOOD

## 2020-01-01 LAB — RPR TITER: RPR titer: 1:16 {titer} — AB

## 2020-01-01 LAB — PROCALCITONIN: Procalcitonin: 8.74 ng/mL — ABNORMAL HIGH

## 2020-01-01 LAB — C REACTIVE PROTEIN, QT: C-Reactive protein: 14.2 mg/dL — ABNORMAL HIGH (ref 0.00–0.60)

## 2020-01-01 LAB — MISC. LAB TEST

## 2020-01-01 LAB — MAGNESIUM: Magnesium: 2.3 mg/dL (ref 1.6–2.4)

## 2020-01-01 MED FILL — VANCOMYCIN 500 MG IV SOLR: 500 mg | INTRAVENOUS | Qty: 500

## 2020-01-01 MED FILL — SUCRALFATE 1 GRAM TAB: 1 gram | ORAL | Qty: 1

## 2020-01-01 MED FILL — 1/2 NS WITH POTASSIUM CHLORIDE 20 MEQ/L IV: 20 mEq/L | INTRAVENOUS | Qty: 1000

## 2020-01-01 MED FILL — MUPIROCIN 2 % OINTMENT: 2 % | CUTANEOUS | Qty: 1

## 2020-01-01 MED FILL — TIVICAY 50 MG TABLET: 50 mg | ORAL | Qty: 1

## 2020-01-01 MED FILL — TAMSULOSIN SR 0.4 MG 24 HR CAP: 0.4 mg | ORAL | Qty: 1

## 2020-01-01 MED FILL — DAPSONE 25 MG TAB: 25 mg | ORAL | Qty: 4

## 2020-01-01 MED FILL — PANTOPRAZOLE 40 MG TAB, DELAYED RELEASE: 40 mg | ORAL | Qty: 1

## 2020-01-01 MED FILL — DESCOVY 200 MG-25 MG TABLET: 200-25 mg | ORAL | Qty: 1

## 2020-01-02 ENCOUNTER — Encounter
Admit: 2020-01-02 | Discharge: 2020-01-03 | Disposition: A | Discharge status: Hospice | Payer: PRIVATE HEALTH INSURANCE | Source: Other Acute Inpatient Hospital | Attending: Family Medicine | Admitting: Family Medicine

## 2020-01-02 ENCOUNTER — Inpatient Hospital Stay: Admit: 2020-01-02 | Payer: MEDICARE | Primary: Internal Medicine

## 2020-01-02 LAB — CBC WITH AUTOMATED DIFF
ABS. BASOPHILS: 0 10*3/uL (ref 0.0–0.1)
ABS. EOSINOPHILS: 0.1 10*3/uL (ref 0.0–0.4)
ABS. IMM. GRANS.: 0.3 10*3/uL — ABNORMAL HIGH (ref 0.00–0.04)
ABS. LYMPHOCYTES: 0.6 10*3/uL — ABNORMAL LOW (ref 0.8–3.5)
ABS. MONOCYTES: 0.3 10*3/uL (ref 0.0–1.0)
ABS. NEUTROPHILS: 15.8 10*3/uL — ABNORMAL HIGH (ref 1.8–8.0)
ABSOLUTE NRBC: 0 10*3/uL (ref 0.00–0.01)
BASOPHILS: 0 % (ref 0–1)
EOSINOPHILS: 1 % (ref 0–7)
HCT: 26.4 % — ABNORMAL LOW (ref 36.6–50.3)
HGB: 9 g/dL — ABNORMAL LOW (ref 12.1–17.0)
IMMATURE GRANULOCYTES: 2 % — ABNORMAL HIGH (ref 0–0.5)
LYMPHOCYTES: 3 % — ABNORMAL LOW (ref 12–49)
MCH: 30.3 PG (ref 26.0–34.0)
MCHC: 34.1 g/dL (ref 30.0–36.5)
MCV: 88.9 FL (ref 80.0–99.0)
MONOCYTES: 2 % — ABNORMAL LOW (ref 5–13)
MPV: 13.5 FL — ABNORMAL HIGH (ref 8.9–12.9)
NEUTROPHILS: 92 % — ABNORMAL HIGH (ref 32–75)
NRBC: 0 PER 100 WBC
PLATELET: 41 10*3/uL — CL (ref 150–400)
RBC: 2.97 M/uL — ABNORMAL LOW (ref 4.10–5.70)
RDW: 15.9 % — ABNORMAL HIGH (ref 11.5–14.5)
WBC: 16.8 10*3/uL — ABNORMAL HIGH (ref 4.1–11.1)

## 2020-01-02 LAB — RENAL FUNCTION PANEL
Albumin: 1.2 g/dL — ABNORMAL LOW (ref 3.5–5.0)
Anion gap: 3 mmol/L — ABNORMAL LOW (ref 5–15)
BUN/Creatinine ratio: 31 — ABNORMAL HIGH (ref 12–20)
BUN: 45 mg/dL — ABNORMAL HIGH (ref 6–20)
CO2: 23 mmol/L (ref 21–32)
Calcium: 7.6 mg/dL — ABNORMAL LOW (ref 8.5–10.1)
Chloride: 106 mmol/L (ref 97–108)
Creatinine: 1.47 mg/dL — ABNORMAL HIGH (ref 0.70–1.30)
GFR est AA: 58 mL/min/{1.73_m2} — ABNORMAL LOW (ref >60)
GFR est non-AA: 48 mL/min/{1.73_m2} — ABNORMAL LOW (ref >60)
Glucose: 124 mg/dL — ABNORMAL HIGH (ref 65–100)
Phosphorus: 2.8 mg/dL (ref 2.6–4.7)
Potassium: 5.2 mmol/L — ABNORMAL HIGH (ref 3.5–5.1)
Sodium: 132 mmol/L — ABNORMAL LOW (ref 136–145)

## 2020-01-02 LAB — BLOOD GAS, ARTERIAL
BASE DEFICIT: 2.8 mmol/L — ABNORMAL HIGH (ref 0–2)
BICARBONATE: 22 mmol/L (ref 22–26)
O2 FLOW RATE: 5 L/min
O2 SAT: 92 % — ABNORMAL LOW (ref >95)
PCO2: 30 mmHg — ABNORMAL LOW (ref 35–45)
PO2: 59 mmHg — ABNORMAL LOW (ref 75–100)
pH: 7.45 (ref 7.35–7.45)

## 2020-01-02 LAB — RPR: RPR: REACTIVE — AB

## 2020-01-02 LAB — C REACTIVE PROTEIN, QT: C-Reactive protein: 15.5 mg/dL — ABNORMAL HIGH (ref 0.00–0.60)

## 2020-01-02 LAB — IRON PROFILE
Iron % saturation: 32 % (ref 20–50)
Iron: 23 ug/dL — ABNORMAL LOW (ref 35–150)
TIBC: 71 ug/dL — ABNORMAL LOW (ref 250–450)

## 2020-01-02 LAB — HEPARIN INDUCED PLATELET AB BY EIA: Heparin Induced Platelet Ab: 0.192 OD (ref 0.000–0.400)

## 2020-01-02 LAB — LD: LD: 189 U/L (ref 85–241)

## 2020-01-02 LAB — VANCOMYCIN, TROUGH: Vancomycin,trough: 16.9 ug/mL — ABNORMAL HIGH (ref 5.0–10.0)

## 2020-01-02 LAB — PROCALCITONIN: Procalcitonin: 5.31 ng/mL — ABNORMAL HIGH

## 2020-01-02 LAB — T PALLIDUM AB: Treponema pallidum Ab: REACTIVE — AB

## 2020-01-02 LAB — RPR TITER: RPR titer: 1:16 {titer} — AB

## 2020-01-02 MED ORDER — HYDROMORPHONE 1 MG/ML INJECTION SOLUTION
1 mg/mL | INTRAMUSCULAR | Status: DC
Start: 2020-01-02 — End: 2020-01-03
  Administered 2020-01-03: 21:00:00 via INTRAVENOUS

## 2020-01-02 MED ORDER — IPRATROPIUM-ALBUTEROL 2.5 MG-0.5 MG/3 ML NEB SOLUTION
2.5 mg-0.5 mg/3 ml | Freq: Four times a day (QID) | RESPIRATORY_TRACT | Status: DC | PRN
Start: 2020-01-02 — End: 2020-01-02

## 2020-01-02 MED ORDER — SODIUM CHLORIDE 0.9 % IV
INTRAVENOUS | Status: DC
Start: 2020-01-02 — End: 2020-01-02
  Administered 2020-01-02 (×2): via INTRAVENOUS

## 2020-01-02 MED ORDER — LORAZEPAM 2 MG/ML IJ SOLN
2 mg/mL | INTRAMUSCULAR | Status: DC
Start: 2020-01-02 — End: 2020-01-03
  Administered 2020-01-03 (×3): via INTRAVENOUS

## 2020-01-02 MED ORDER — FUROSEMIDE 10 MG/ML IJ SOLN
10 mg/mL | Freq: Two times a day (BID) | INTRAMUSCULAR | Status: DC
Start: 2020-01-02 — End: 2020-01-02

## 2020-01-02 MED ORDER — SALINE PERIPHERAL FLUSH PRN
INTRAMUSCULAR | Status: DC | PRN
Start: 2020-01-02 — End: 2020-01-03

## 2020-01-02 MED ORDER — BISACODYL 10 MG RECTAL SUPPOSITORY
10 mg | Freq: Every day | RECTAL | Status: DC | PRN
Start: 2020-01-02 — End: 2020-01-03

## 2020-01-02 MED ORDER — HYDROMORPHONE 1 MG/ML INJECTION SOLUTION
1 mg/mL | INTRAMUSCULAR | Status: DC | PRN
Start: 2020-01-02 — End: 2020-01-03
  Administered 2020-01-02: 21:00:00 via INTRAVENOUS

## 2020-01-02 MED ORDER — BUDESONIDE 0.5 MG/2 ML NEB SUSPENSION
0.5 mg/2 mL | Freq: Two times a day (BID) | RESPIRATORY_TRACT | Status: DC
Start: 2020-01-02 — End: 2020-01-02
  Administered 2020-01-02: 16:00:00 via RESPIRATORY_TRACT

## 2020-01-02 MED ORDER — LORAZEPAM 2 MG/ML IJ SOLN
2 mg/mL | INTRAMUSCULAR | Status: DC | PRN
Start: 2020-01-02 — End: 2020-01-03

## 2020-01-02 MED ORDER — FUROSEMIDE 10 MG/ML IJ SOLN
10 mg/mL | Freq: Once | INTRAMUSCULAR | Status: AC
Start: 2020-01-02 — End: 2020-01-02
  Administered 2020-01-02: 15:00:00 via INTRAVENOUS

## 2020-01-02 MED ORDER — ALBUMIN, HUMAN 25 % IV
25 % | Freq: Two times a day (BID) | INTRAVENOUS | Status: DC
Start: 2020-01-02 — End: 2020-01-02

## 2020-01-02 MED ORDER — ACETAMINOPHEN 650 MG RECTAL SUPPOSITORY
650 mg | RECTAL | Status: DC | PRN
Start: 2020-01-02 — End: 2020-01-03

## 2020-01-02 MED ORDER — FOLIC ACID 1 MG TAB
1 mg | Freq: Every day | ORAL | Status: DC
Start: 2020-01-02 — End: 2020-01-02

## 2020-01-02 MED ORDER — GLYCOPYRROLATE 0.2 MG/ML IJ SOLN
0.2 mg/mL | INTRAMUSCULAR | Status: DC | PRN
Start: 2020-01-02 — End: 2020-01-03

## 2020-01-02 MED FILL — TAMSULOSIN SR 0.4 MG 24 HR CAP: 0.4 mg | ORAL | Qty: 1

## 2020-01-02 MED FILL — DAPSONE 25 MG TAB: 25 mg | ORAL | Qty: 4

## 2020-01-02 MED FILL — AMIODARONE 50 MG/ML IV SOLN: 50 mg/mL | INTRAVENOUS | Qty: 7.5

## 2020-01-02 MED FILL — SODIUM CHLORIDE 0.9 % IV: INTRAVENOUS | Qty: 1000

## 2020-01-02 MED FILL — PANTOPRAZOLE 40 MG TAB, DELAYED RELEASE: 40 mg | ORAL | Qty: 1

## 2020-01-02 MED FILL — SUCRALFATE 1 GRAM TAB: 1 gram | ORAL | Qty: 1

## 2020-01-02 MED FILL — SODIUM CHLORIDE 0.9 % IJ SYRG: INTRAMUSCULAR | Qty: 5

## 2020-01-02 MED FILL — HYDROMORPHONE (PF) 1 MG/ML IJ SOLN: 1 mg/mL | INTRAMUSCULAR | Qty: 1

## 2020-01-02 MED FILL — MUPIROCIN 2 % OINTMENT: 2 % | CUTANEOUS | Qty: 1

## 2020-01-02 MED FILL — 1/2 NS WITH POTASSIUM CHLORIDE 20 MEQ/L IV: 20 mEq/L | INTRAVENOUS | Qty: 1000

## 2020-01-02 MED FILL — DESCOVY 200 MG-25 MG TABLET: 200-25 mg | ORAL | Qty: 1

## 2020-01-02 MED FILL — BUDESONIDE 0.5 MG/2 ML NEB SUSPENSION: 0.5 mg/2 mL | RESPIRATORY_TRACT | Qty: 1

## 2020-01-02 MED FILL — TIVICAY 50 MG TABLET: 50 mg | ORAL | Qty: 1

## 2020-01-02 MED FILL — FUROSEMIDE 10 MG/ML IJ SOLN: 10 mg/mL | INTRAMUSCULAR | Qty: 4

## 2020-01-03 ENCOUNTER — Encounter
Admit: 2020-01-03 | Discharge: 2020-01-31 | Disposition: E | Payer: MEDICARE | Attending: Family Medicine | Admitting: Family Medicine

## 2020-01-03 DIAGNOSIS — Z515 Encounter for palliative care: Secondary | ICD-10-CM

## 2020-01-03 LAB — COVID-19 RAPID TEST: COVID-19 rapid test: NOT DETECTED

## 2020-01-03 LAB — EKG, 12 LEAD, SUBSEQUENT
Atrial Rate: 63 {beats}/min
Calculated P Axis: 82 degrees
Calculated R Axis: 20 degrees
Calculated T Axis: 55 degrees
P-R Interval: 284 ms
Q-T Interval: 436 ms
QRS Duration: 84 ms
QTC Calculation (Bezet): 446 ms
Ventricular Rate: 63 {beats}/min

## 2020-01-03 LAB — T PALLIDUM AB: Treponema pallidum Ab: REACTIVE — AB

## 2020-01-03 MED ORDER — HYDROMORPHONE 1 MG/ML INJECTION SOLUTION
1 mg/mL | INTRAMUSCULAR | Status: DC | PRN
Start: 2020-01-03 — End: 2020-01-04
  Administered 2020-01-03: 23:00:00 via INTRAVENOUS

## 2020-01-03 MED ORDER — LORAZEPAM 2 MG/ML IJ SOLN
2 mg/mL | INTRAMUSCULAR | Status: DC | PRN
Start: 2020-01-03 — End: 2020-01-04
  Administered 2020-01-03: 23:00:00 via INTRAVENOUS

## 2020-01-03 MED ORDER — SENNOSIDES-DOCUSATE SODIUM 8.6 MG-50 MG TAB
Freq: Two times a day (BID) | ORAL | Status: DC | PRN
Start: 2020-01-03 — End: 2020-01-04

## 2020-01-03 MED ORDER — GLYCOPYRROLATE 0.2 MG/ML IJ SOLN
0.2 mg/mL | INTRAMUSCULAR | Status: DC | PRN
Start: 2020-01-03 — End: 2020-01-04

## 2020-01-03 MED ORDER — HYDROMORPHONE 1 MG/ML INJECTION SOLUTION
1 mg/mL | INTRAMUSCULAR | Status: DC
Start: 2020-01-03 — End: 2020-01-04
  Administered 2020-01-04: via INTRAVENOUS

## 2020-01-03 MED ORDER — ACETAMINOPHEN 650 MG RECTAL SUPPOSITORY
650 mg | RECTAL | Status: DC | PRN
Start: 2020-01-03 — End: 2020-01-04

## 2020-01-03 MED ORDER — BISACODYL 10 MG RECTAL SUPPOSITORY
10 mg | Freq: Every day | RECTAL | Status: DC | PRN
Start: 2020-01-03 — End: 2020-01-04

## 2020-01-03 MED ORDER — LORAZEPAM 2 MG/ML IJ SOLN
2 mg/mL | INTRAMUSCULAR | Status: DC
Start: 2020-01-03 — End: 2020-01-04
  Administered 2020-01-04: via INTRAVENOUS

## 2020-01-03 MED FILL — HYDROMORPHONE (PF) 1 MG/ML IJ SOLN: 1 mg/mL | INTRAMUSCULAR | Qty: 1

## 2020-01-03 MED FILL — LORAZEPAM 2 MG/ML IJ SOLN: 2 mg/mL | INTRAMUSCULAR | Qty: 1

## 2020-01-03 MED FILL — SODIUM CHLORIDE 0.9 % INJECTION: INTRAMUSCULAR | Qty: 10

## 2020-01-05 LAB — SEROTONIN RELEASE ASSAY
Unfrac Heparin High Dose: <1 % (ref 0–20)
Unfrac Heparin Low Dose: <1 % (ref 0–20)

## 2020-01-31 DEATH — deceased
